# Patient Record
Sex: Male | Born: 1940
Health system: Southern US, Community
[De-identification: ages and names within clinical notes are randomized; demographics above are authoritative.]

## PROBLEM LIST (undated history)

## (undated) DIAGNOSIS — C4491 Basal cell carcinoma of skin, unspecified: Secondary | ICD-10-CM

## (undated) DIAGNOSIS — N400 Enlarged prostate without lower urinary tract symptoms: Secondary | ICD-10-CM

## (undated) DIAGNOSIS — Z87442 Personal history of urinary calculi: Secondary | ICD-10-CM

## (undated) DIAGNOSIS — F419 Anxiety disorder, unspecified: Secondary | ICD-10-CM

## (undated) DIAGNOSIS — I499 Cardiac arrhythmia, unspecified: Secondary | ICD-10-CM

## (undated) DIAGNOSIS — Z973 Presence of spectacles and contact lenses: Secondary | ICD-10-CM

## (undated) DIAGNOSIS — K2270 Barrett's esophagus without dysplasia: Secondary | ICD-10-CM

## (undated) DIAGNOSIS — J90 Pleural effusion, not elsewhere classified: Secondary | ICD-10-CM

## (undated) DIAGNOSIS — R011 Cardiac murmur, unspecified: Secondary | ICD-10-CM

## (undated) DIAGNOSIS — I10 Essential (primary) hypertension: Secondary | ICD-10-CM

## (undated) DIAGNOSIS — K219 Gastro-esophageal reflux disease without esophagitis: Secondary | ICD-10-CM

## (undated) DIAGNOSIS — Z95828 Presence of other vascular implants and grafts: Secondary | ICD-10-CM

## (undated) DIAGNOSIS — IMO0002 Reserved for concepts with insufficient information to code with codable children: Secondary | ICD-10-CM

## (undated) DIAGNOSIS — R0600 Dyspnea, unspecified: Secondary | ICD-10-CM

## (undated) DIAGNOSIS — C159 Malignant neoplasm of esophagus, unspecified: Secondary | ICD-10-CM

## (undated) DIAGNOSIS — K769 Liver disease, unspecified: Secondary | ICD-10-CM

## (undated) DIAGNOSIS — R350 Frequency of micturition: Secondary | ICD-10-CM

## (undated) DIAGNOSIS — R609 Edema, unspecified: Secondary | ICD-10-CM

## (undated) DIAGNOSIS — T18128A Food in esophagus causing other injury, initial encounter: Secondary | ICD-10-CM

## (undated) DIAGNOSIS — Z9221 Personal history of antineoplastic chemotherapy: Secondary | ICD-10-CM

## (undated) DIAGNOSIS — IMO0001 Reserved for inherently not codable concepts without codable children: Secondary | ICD-10-CM

## (undated) DIAGNOSIS — G709 Myoneural disorder, unspecified: Secondary | ICD-10-CM

## (undated) DIAGNOSIS — M1990 Unspecified osteoarthritis, unspecified site: Secondary | ICD-10-CM

## (undated) DIAGNOSIS — T7840XA Allergy, unspecified, initial encounter: Secondary | ICD-10-CM

## (undated) HISTORY — DX: Benign prostatic hyperplasia without lower urinary tract symptoms: N40.0

## (undated) HISTORY — PX: KNEE ARTHROSCOPY: SUR90

## (undated) HISTORY — DX: Basal cell carcinoma of skin, unspecified: C44.91

## (undated) HISTORY — DX: Malignant neoplasm of esophagus, unspecified: C15.9

## (undated) HISTORY — PX: COLONOSCOPY: SHX174

## (undated) HISTORY — DX: Anxiety disorder, unspecified: F41.9

## (undated) HISTORY — DX: Unspecified osteoarthritis, unspecified site: M19.90

## (undated) HISTORY — DX: Essential (primary) hypertension: I10

---
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
## 1978-04-24 HISTORY — PX: CERVICAL LAMINECTOMY: SHX94

## 2005-02-27 ENCOUNTER — Ambulatory Visit (HOSPITAL_BASED_OUTPATIENT_CLINIC_OR_DEPARTMENT_OTHER): Admission: RE | Admit: 2005-02-27 | Discharge: 2005-02-27 | Payer: Self-pay | Admitting: Orthopedic Surgery

## 2005-02-27 ENCOUNTER — Ambulatory Visit (HOSPITAL_COMMUNITY): Admission: RE | Admit: 2005-02-27 | Discharge: 2005-02-27 | Payer: Self-pay | Admitting: Orthopedic Surgery

## 2007-12-24 ENCOUNTER — Ambulatory Visit: Payer: Self-pay | Admitting: Internal Medicine

## 2008-01-27 ENCOUNTER — Ambulatory Visit: Payer: Self-pay | Admitting: Internal Medicine

## 2008-02-05 ENCOUNTER — Ambulatory Visit: Payer: Self-pay | Admitting: Internal Medicine

## 2008-02-05 ENCOUNTER — Encounter: Payer: Self-pay | Admitting: Internal Medicine

## 2008-02-06 ENCOUNTER — Encounter: Payer: Self-pay | Admitting: Internal Medicine

## 2008-04-24 HISTORY — PX: TOTAL KNEE ARTHROPLASTY: SHX125

## 2008-10-13 ENCOUNTER — Ambulatory Visit (HOSPITAL_BASED_OUTPATIENT_CLINIC_OR_DEPARTMENT_OTHER): Admission: RE | Admit: 2008-10-13 | Discharge: 2008-10-13 | Payer: Self-pay | Admitting: Orthopedic Surgery

## 2009-06-14 ENCOUNTER — Inpatient Hospital Stay (HOSPITAL_COMMUNITY): Admission: RE | Admit: 2009-06-14 | Discharge: 2009-06-16 | Payer: Self-pay | Admitting: Orthopedic Surgery

## 2010-05-16 ENCOUNTER — Ambulatory Visit
Admission: RE | Admit: 2010-05-16 | Discharge: 2010-05-16 | Payer: Self-pay | Source: Home / Self Care | Attending: Orthopedic Surgery | Admitting: Orthopedic Surgery

## 2010-05-16 LAB — BASIC METABOLIC PANEL
BUN: 14 mg/dL (ref 6–23)
Calcium: 9.3 mg/dL (ref 8.4–10.5)
Chloride: 103 mEq/L (ref 96–112)
Creatinine, Ser: 0.84 mg/dL (ref 0.4–1.5)

## 2010-05-17 LAB — POCT HEMOGLOBIN-HEMACUE: Hemoglobin: 16.1 g/dL (ref 13.0–17.0)

## 2010-05-25 NOTE — Op Note (Signed)
David [name], David [name]                  ACCOUNT NO.:  [id number]  MEDICAL RECORD NO.:  [id number]          PATIENT TYPE:  AMB  LOCATION:  DSC                          FACILITY:  [facility]  PHYSICIAN:  Veverly Larimer A. Thurston Hole, M.D. DATE OF BIRTH:  November 11, 1940  DATE OF PROCEDURE:  05/16/2010 DATE OF DISCHARGE:                              OPERATIVE REPORT   PREOPERATIVE DIAGNOSIS:  Left knee medial and lateral meniscal tears with chondromalacia and synovitis.  POSTOPERATIVE DIAGNOSIS:  Left knee medial and lateral meniscal tears with chondromalacia and synovitis.  PROCEDURES: 1. Left knee examination under anesthesia followed by arthroscopic     partial medial and lateral meniscectomies. 2. Left knee chondroplasty with partial synovectomy.  SURGEON:  Elana Alm. Thurston Hole, MD  ASSISTANT:  None.  ANESTHESIA:  General.  OPERATIVE TIME:  30 minutes.  COMPLICATIONS:  None.  INDICATIONS FOR PROCEDURE:  [name] is a 70 year old gentleman who has had 6-8 months of increasing left knee pain with exam and MRI documenting meniscal tearing with chondromalacia and synovitis.  He has failed conservative care and is now to undergo arthroscopy.  DESCRIPTION:  [name] was brought to the operating room on May 16, 2010, after a knee block was placed in holding area by Anesthesia.  He was placed on the operative table in supine position.  He received Ancef 1 g IV preoperatively for prophylaxis.  After being placed under general anesthesia, his left knee was examined.  He had full range of motion. He had stable ligamentous exam with normal patellar tracking.  Left leg was prepped using sterile DuraPrep and draped using sterile technique. Time-out procedure was called and the correct left leg identified. Initially through an anterolateral portal, the arthroscope with a pump attached was placed into an anteromedial portal and arthroscopic probe was placed.  On initial inspection of medial compartment, he had  25-30% grade 3 chondromalacia which was debrided, medial meniscus showed tearing of the anterior and posterior horn of which 75% of the anterior horn, 30% of the posterior horn was resected back to stable rim. Intercondylar notch inspected.  Anterior and posterior cruciate ligaments were normal.  Lateral compartment was inspected.  The articular cartilage showed 25% grade 3 chondromalacia which was debrided.  Lateral meniscus showed a tear of the posteromedial root 25% which was resected back to stable rim.  Patellofemoral joint showed 25- 30% grade 3 chondromalacia on the patellofemoral groove and this was debrided.  The patella tracked normally.  There was significant very nodular synovitis in medial and lateral gutters which was thoroughly debrided, otherwise, this was free of pathology.  After this was done, it was felt that all pathology had been satisfactorily addressed.  The instruments removed.  Portals closed with 3-0 nylon suture.  Sterile dressings were applied, and the patient awakened and taken to recovery room in stable condition.  FOLLOWUP CARE:  [name] will be followed as an outpatient on Percocet for pain.  He will be seen back in the office in a week for sutures out and followup.     Gaelen Brager A. Thurston Hole, M.D.     RAW/MEDQ  D:  05/16/2010  T:  05/17/2010  Job:  [id number]  Electronically Signed by Salvatore Marvel M.D. on 05/25/2010 10:43:41 AM

## 2010-07-13 LAB — CBC
HCT: 38 % — ABNORMAL LOW (ref 39.0–52.0)
HCT: 46.5 % (ref 39.0–52.0)
Hemoglobin: 13.2 g/dL (ref 13.0–17.0)
Hemoglobin: 16.2 g/dL (ref 13.0–17.0)
MCHC: 34.7 g/dL (ref 30.0–36.0)
MCV: 96.3 fL (ref 78.0–100.0)
Platelets: 138 10*3/uL — ABNORMAL LOW (ref 150–400)
RBC: 3.95 MIL/uL — ABNORMAL LOW (ref 4.22–5.81)
RBC: 4.86 MIL/uL (ref 4.22–5.81)
RDW: 12.3 % (ref 11.5–15.5)
RDW: 12.6 % (ref 11.5–15.5)
WBC: 11 10*3/uL — ABNORMAL HIGH (ref 4.0–10.5)
WBC: 13.3 10*3/uL — ABNORMAL HIGH (ref 4.0–10.5)
WBC: 7.1 10*3/uL (ref 4.0–10.5)

## 2010-07-13 LAB — URINE CULTURE
Colony Count: 9000
Colony Count: NO GROWTH

## 2010-07-13 LAB — BASIC METABOLIC PANEL
BUN: 8 mg/dL (ref 6–23)
CO2: 29 mEq/L (ref 19–32)
Calcium: 8.3 mg/dL — ABNORMAL LOW (ref 8.4–10.5)
GFR calc Af Amer: >60 mL/min (ref >60)
Glucose, Bld: 108 mg/dL — ABNORMAL HIGH (ref 70–99)
Glucose, Bld: 124 mg/dL — ABNORMAL HIGH (ref 70–99)
Sodium: 132 mEq/L — ABNORMAL LOW (ref 135–145)

## 2010-07-13 LAB — URINALYSIS, ROUTINE W REFLEX MICROSCOPIC
Bilirubin Urine: NEGATIVE
Glucose, UA: NEGATIVE mg/dL
Glucose, UA: NEGATIVE mg/dL
Hgb urine dipstick: NEGATIVE
Nitrite: NEGATIVE
Specific Gravity, Urine: 1.017 (ref 1.005–1.030)
Urobilinogen, UA: 0.2 mg/dL (ref 0.0–1.0)
Urobilinogen, UA: 1 mg/dL (ref 0.0–1.0)
pH: 6.5 (ref 5.0–8.0)
pH: 7.5 (ref 5.0–8.0)

## 2010-07-13 LAB — COMPREHENSIVE METABOLIC PANEL
AST: 27 U/L (ref 0–37)
BUN: 14 mg/dL (ref 6–23)
Calcium: 9.6 mg/dL (ref 8.4–10.5)
Creatinine, Ser: 0.92 mg/dL (ref 0.4–1.5)
GFR calc non Af Amer: >60 mL/min (ref >60)
Sodium: 143 mEq/L (ref 135–145)

## 2010-07-13 LAB — ABO/RH: ABO/RH(D): O POS

## 2010-07-13 LAB — DIFFERENTIAL
Eosinophils Absolute: 0.1 10*3/uL (ref 0.0–0.7)
Lymphocytes Relative: 23 % (ref 12–46)
Lymphs Abs: 1.7 10*3/uL (ref 0.7–4.0)

## 2010-07-13 LAB — PROTIME-INR
INR: 1.36 (ref 0.00–1.49)
Prothrombin Time: 12.4 seconds (ref 11.6–15.2)
Prothrombin Time: 15.1 seconds (ref 11.6–15.2)

## 2010-07-13 LAB — APTT: aPTT: 29 seconds (ref 24–37)

## 2010-08-01 LAB — BASIC METABOLIC PANEL WITH GFR
BUN: 13 mg/dL (ref 6–23)
CO2: 29 meq/L (ref 19–32)
Calcium: 9.1 mg/dL (ref 8.4–10.5)
Chloride: 104 meq/L (ref 96–112)
Creatinine, Ser: 0.73 mg/dL (ref 0.4–1.5)
GFR calc non Af Amer: >60 mL/min
Glucose, Bld: 98 mg/dL (ref 70–99)
Potassium: 4 meq/L (ref 3.5–5.1)
Sodium: 139 meq/L (ref 135–145)

## 2010-09-06 NOTE — Op Note (Signed)
David [name], David [name]                  ACCOUNT NO.:  [id number]   MEDICAL RECORD NO.:  [id number]          PATIENT TYPE:  AMB   LOCATION:  DSC                          FACILITY:  [facility]   PHYSICIAN:  Robert A. Thurston Hole, M.D. DATE OF BIRTH:  [birth year]   DATE OF PROCEDURE:  10/13/2008  DATE OF DISCHARGE:                               OPERATIVE REPORT   PREOPERATIVE DIAGNOSIS:  Right knee medial and lateral meniscal tears  with chondromalacia and synovitis.   POSTOPERATIVE DIAGNOSIS:  Right knee medial and lateral meniscal tears  with chondromalacia and synovitis.   PROCEDURE:  1. Right knee examination under anesthesia followed by orthoscopic      partial medial and lateral meniscectomies.  2. Right knee chondroplasty with partial synovectomy.   SURGEON:  Elana Alm. Thurston Hole, MD   ANESTHESIA:  Local and MAC.   OPERATIVE TIME:  30 minutes.   COMPLICATIONS:  None.   INDICATIONS FOR PROCEDURE:  Mr. Goeller is a 70 year old gentleman who has  had significant increasing right knee pain for the past 6-8 months with  exam and MRI documenting meniscal tearing with chondromalacia and  synovitis.  He has failed conservative care and is now to undergo  arthroscopy.   DESCRIPTION:  Mr. Bahl was brought to the operating room on October 13, 2008, after a knee block had been placed in the holding by Anesthesia.  He was placed on the operating table in supine position.  His right knee  was examined under anesthesia.  He had full range of motion.  He was  stable on ligamentous exam with normal patellar tracking.  The right leg  was then prepped using sterile DuraPrep and draped using sterile  technique.  He received Ancef 2 g IV preoperatively for prophylaxis.  After the right leg was prepped and draped sterilely then the  arthroscopy was performed.  Through an anterolateral portal, the  arthroscope with a pump attached was placed and through an anteromedial  portal, an arthroscopic probe was placed.  On  initial inspection of the  medial compartment, the articular cartilage showed 50% grade III  chondromalacia which was debrided.  Medial meniscus showed tearing in  the posterior and medial horn in which 50% was resected back to a stable  rim.  Intercondylar notch inspected, the anterior and posterior cruciate  ligaments were normal.  Lateral compartment inspected, the articular  cartilage showed 30% grade III chondromalacia.  Lateral meniscus partial  tear 30% posterolateral corner which was resected back to a stable rim.  Patellofemoral joint showed 30% grade III chondromalacia which was  debrided.  The patella tracked normally.  Moderate synovitis, and medial  and lateral gutters were debrided, otherwise, it is free of pathology.  After this was done, it was felt that all pathology had been  satisfactorily addressed.  The instruments were removed.  The portals  were closed with 3-0 nylon suture.  Sterile dressings were applied and  then the patient awakened and taken to the recovery room in stable  condition.   FOLLOWUP CARE:  Mr. Pate will be followed as an  outpatient on Vicodin and  ibuprofen.  He will be seen back in the office in a week for sutures out  and followup.      Robert A. Thurston Hole, M.D.  Electronically Signed     RAW/MEDQ  D:  10/13/2008  T:  10/14/2008  Job:  [id number]

## 2010-09-09 NOTE — Op Note (Signed)
David [name], David [name]                  ACCOUNT NO.:  [id number]   MEDICAL RECORD NO.:  [id number]          PATIENT TYPE:  AMB   LOCATION:  DSC                          FACILITY:  [facility]   PHYSICIAN:  Robert A. Thurston Hole, M.D. DATE OF BIRTH:  01/31/1941   DATE OF PROCEDURE:  02/27/2005  DATE OF DISCHARGE:                                 OPERATIVE REPORT   PREOPERATIVE DIAGNOSIS:  Right knee medial and lateral meniscal tears with  chondromalacia and synovitis.   POSTOPERATIVE DIAGNOSIS:  Right knee medial and lateral meniscal tears with  chondromalacia and synovitis.   PROCEDURE:  Right knee EUA followed by arthroscopic partial medial and  lateral meniscectomy.  Right knee chondroplasty with partial synovectomy.   SURGEON:  Elana Alm. Thurston Hole, M.D.   ASSISTANT:  Julien Girt, P.A.   ANESTHESIA:  General.   OPERATIVE TIME:  30 minutes.   COMPLICATIONS:  None.   INDICATIONS FOR PROCEDURE:  David [name] is a 70 year old gentleman who has had  six months of increasing right knee pain with exam and MRI documenting  medial and lateral meniscal tears with chondromalacia and synovitis, who has  failed conservative care and is now to undergo arthroscopy.   DESCRIPTION:  David [name] was brought to operating room on February 27, 2005,  placed on the operative table supine position. After adequate level general  anesthesia was obtained, his right knee was examined. He had range of motion  from 0 to 125 degrees, 2 to 3+ crepitation, knee stable ligamentous exam  with normal patellar tracking. The right leg was prepped in sterile DuraPrep  and draped using sterile technique. Originally through an anterolateral  portal, the arthroscope with a pump attached was placed and through an  anteromedial portal an arthroscopic probe was placed. On initial inspection  medial compartment was found to have 50% grade 3 chondromalacia which was  debrided. Medial meniscus tear posterior medial horn of which 50% was  resected back to stable rim. Intercondylar notch inspected, anterior and  posterior cruciate ligaments were normal. Lateral compartments inspected,  30% grade 3 chondromalacia which was debrided. Lateral meniscus tear 25%  posterior and lateral horn which was resected back to stable rim.  Patellofemoral joint showed 50% grade 3 chondromalacia which was debrided.  The patella tracked normally. Significant synovitis in the medial lateral  gutters were debrided. Otherwise they are free of pathology. After this was  done it was felt that all pathology had been satisfactorily addressed. The  instruments were removed. Portals closed with 3-0 nylon suture and injected  with 0.25% Marcaine with epinephrine 4 milligrams morphine. Sterile  dressings applied. The patient awakened and taken to recovery in stable  condition.   FOLLOW-UP CARE:  David [name] will be followed as outpatient on Vicodin and  Naprosyn. See him back in the office in a week for sutures out follow-up.      Robert A. Thurston Hole, M.D.  Electronically Signed     RAW/MEDQ  D:  02/27/2005  T:  02/27/2005  Job:  [id number]

## 2011-01-24 ENCOUNTER — Other Ambulatory Visit: Payer: Self-pay | Admitting: Dermatology

## 2011-06-08 ENCOUNTER — Ambulatory Visit (AMBULATORY_SURGERY_CENTER): Payer: Medicare Other | Admitting: *Deleted

## 2011-06-08 VITALS — Ht 76.0 in | Wt 234.4 lb

## 2011-06-08 DIAGNOSIS — Z1211 Encounter for screening for malignant neoplasm of colon: Secondary | ICD-10-CM

## 2011-06-08 MED ORDER — PEG-KCL-NACL-NASULF-NA ASC-C 100 G PO SOLR: ORAL | Status: DC

## 2011-06-14 ENCOUNTER — Ambulatory Visit (AMBULATORY_SURGERY_CENTER): Payer: Medicare Other | Admitting: Internal Medicine

## 2011-06-14 ENCOUNTER — Encounter: Payer: Self-pay | Admitting: Internal Medicine

## 2011-06-14 VITALS — BP 148/82 | HR 81 | Temp 97.3°F | Resp 20 | Ht 76.0 in | Wt 234.0 lb

## 2011-06-14 DIAGNOSIS — D126 Benign neoplasm of colon, unspecified: Secondary | ICD-10-CM | POA: Diagnosis not present

## 2011-06-14 DIAGNOSIS — Z1211 Encounter for screening for malignant neoplasm of colon: Secondary | ICD-10-CM

## 2011-06-14 DIAGNOSIS — Z86010 Personal history of colonic polyps: Secondary | ICD-10-CM | POA: Diagnosis not present

## 2011-06-14 DIAGNOSIS — F411 Generalized anxiety disorder: Secondary | ICD-10-CM | POA: Diagnosis not present

## 2011-06-14 DIAGNOSIS — I10 Essential (primary) hypertension: Secondary | ICD-10-CM | POA: Diagnosis not present

## 2011-06-14 MED ORDER — SODIUM CHLORIDE 0.9 % IV SOLN: 500.0000 mL | INTRAVENOUS | Status: DC

## 2011-06-14 NOTE — Patient Instructions (Signed)
YOU HAD AN ENDOSCOPIC PROCEDURE TODAY AT THE [facility]: Refer to the procedure report that was given to you for any specific questions about what was found during the examination.  If the procedure report does not answer your questions, please call your gastroenterologist to clarify.  If you requested that your care partner not be given the details of your procedure findings, then the procedure report has been included in a sealed envelope for you to review at your convenience later.  YOU SHOULD EXPECT: Some feelings of bloating in the abdomen. Passage of more gas than usual.  Walking can help get rid of the air that was put into your GI tract during the procedure and reduce the bloating. If you had a lower endoscopy (such as a colonoscopy or flexible sigmoidoscopy) you may notice spotting of blood in your stool or on the toilet paper. If you underwent a bowel prep for your procedure, then you may not have a normal bowel movement for a few days.  DIET: Your first meal following the procedure should be a light meal and then it is ok to progress to your normal diet.  A half-sandwich or bowl of soup is an example of a good first meal.  Heavy or fried foods are harder to digest and may make you feel nauseous or bloated.  Likewise meals heavy in dairy and vegetables can cause extra gas to form and this can also increase the bloating.  Drink plenty of fluids but you should avoid alcoholic beverages for 24 hours.  ACTIVITY: Your care partner should take you home directly after the procedure.  You should plan to take it easy, moving slowly for the rest of the day.  You can resume normal activity the day after the procedure however you should NOT DRIVE or use heavy machinery for 24 hours (because of the sedation medicines used during the test).    SYMPTOMS TO REPORT IMMEDIATELY: A gastroenterologist can be reached at any hour.  During normal business hours, 8:30 AM to 5:00 PM Monday through Friday,  call [phone].  After hours and on weekends, please call the GI answering service at [phone] who will take a message and have the physician on call contact you.   Following lower endoscopy (colonoscopy or flexible sigmoidoscopy):  Excessive amounts of blood in the stool  Significant tenderness or worsening of abdominal pains  Swelling of the abdomen that is new, acute  Fever of 100F or higher    FOLLOW UP: If any biopsies were taken you will be contacted by phone or by letter within the next 1-3 weeks.  Call your gastroenterologist if you have not heard about the biopsies in 3 weeks.  Our staff will call the home number listed on your records the next business day following your procedure to check on you and address any questions or concerns that you may have at that time regarding the information given to you following your procedure. This is a courtesy call and so if there is no answer at the home number and we have not heard from you through the emergency physician on call, we will assume that you have returned to your regular daily activities without incident.  SIGNATURES/CONFIDENTIALITY: You and/or your care partner have signed paperwork which will be entered into your electronic medical record.  These signatures attest to the fact that that the information above on your After Visit Summary has been reviewed and is understood.  Full responsibility of the confidentiality   of this discharge information lies with you and/or your care-partner.     

## 2011-06-14 NOTE — Op Note (Signed)
[facility] [address] [place], [place]  [address]  COLONOSCOPY PROCEDURE REPORT  PATIENT:  David [name], David [name]  MR#:  [id number] BIRTHDATE:  10-25-1940, 70 yrs. old  GENDER:  male ENDOSCOPIST:  Wilhemina Bonito. Eda Keys, MD REF. BY:  Surveillance Program Recall, PROCEDURE DATE:  06/14/2011 PROCEDURE:  Colonoscopy with snare polypectomy x 1 ASA CLASS:  Class II INDICATIONS:  history of pre-cancerous (adenomatous) colon polyps, surveillance and high-risk screening ; index exam w/ 15mm proximal sessile serrated polyp MEDICATIONS:   MAC sedation, administered by CRNA, propofol (Diprivan) 350 mg IV  DESCRIPTION OF PROCEDURE:   After the risks benefits and alternatives of the procedure were thoroughly explained, informed consent was obtained.  Digital rectal exam was performed and revealed no abnormalities.   The LB 180AL [id number] endoscope was introduced through the anus and advanced to the cecum, which was identified by both the appendix and ileocecal valve, without limitations.  The quality of the prep was good, using MoviPrep. The instrument was then slowly withdrawn as the colon was fully examined. <<PROCEDUREIMAGES>>  FINDINGS:  A diminutive polyp was found in the ascending colon and snared without cautery. Retrieval was successful. Moderate diverticulosis was found in the left colon.  Otherwise normal colonoscopy without other polyps, masses, vascular ectasias, or inflammatory changes.   Retroflexed views in the rectum revealed internal hemorrhoids.    The time to cecum =  6:39  minutes. The scope was then withdrawn in  13:45  minutes from the cecum and the procedure completed.  COMPLICATIONS:  None  ENDOSCOPIC IMPRESSION: 1) Diminutive polyp in the ascending colon - removed 2) Moderate diverticulosis in the left colon 3) Otherwise normal colonoscopy 4) Internal hemorrhoids  RECOMMENDATIONS: 1) Follow up colonoscopy in 5 years  ______________________________ Wilhemina Bonito.  Eda Keys, MD  CC:  Kari Baars, MD;   The Patient  n. eSIGNED:   Wilhemina Bonito. Eda Keys at 06/14/2011 12:05 PM  Simonne Martinet, [phone]

## 2011-06-14 NOTE — Progress Notes (Signed)
Propofol per s camp crna per protocol. See scanned intra procedure report. ewm 

## 2011-06-14 NOTE — Progress Notes (Signed)
1233 pt. C/o  Abdominal discomfort level 2 pt. Passed large amount of air. Pt passed more air during dressing and stated that the pain was gone.  1243 upon D/C pt. Denied pain.Patient did not experience any of the following events: a burn prior to discharge; a fall within the facility; wrong site/side/patient/procedure/implant event; or a hospital transfer or hospital admission upon discharge from the facility. [phone]) Patient did not have preoperative order for IV antibiotic SSI prophylaxis. [phone])

## 2011-06-15 ENCOUNTER — Telehealth: Payer: Self-pay

## 2011-06-15 NOTE — Telephone Encounter (Signed)
  Follow up Call-  Call back number 06/14/2011  Post procedure Call Back phone  # [phone] or cell [phone]  Permission to leave phone message Yes     Patient questions:  Do you have a fever, pain , or abdominal swelling? no Pain Score  0 *  Have you tolerated food without any problems? yes  Have you been able to return to your normal activities? yes  Do you have any questions about your discharge instructions: Diet   no Medications  no Follow up visit  no  Do you have questions or concerns about your Care? no  Actions: * If pain score is 4 or above: No action needed, pain <4. No problems per the pt. Maw

## 2011-06-19 ENCOUNTER — Encounter: Payer: Self-pay | Admitting: Internal Medicine

## 2011-06-20 DIAGNOSIS — Z0000 Encounter for general adult medical examination without abnormal findings: Secondary | ICD-10-CM | POA: Diagnosis not present

## 2011-06-20 DIAGNOSIS — Z529 Donor of unspecified organ or tissue: Secondary | ICD-10-CM | POA: Diagnosis not present

## 2011-06-21 DIAGNOSIS — Z0000 Encounter for general adult medical examination without abnormal findings: Secondary | ICD-10-CM | POA: Diagnosis not present

## 2011-07-05 DIAGNOSIS — E669 Obesity, unspecified: Secondary | ICD-10-CM | POA: Diagnosis not present

## 2011-07-05 DIAGNOSIS — I10 Essential (primary) hypertension: Secondary | ICD-10-CM | POA: Diagnosis not present

## 2011-07-05 DIAGNOSIS — E785 Hyperlipidemia, unspecified: Secondary | ICD-10-CM | POA: Diagnosis not present

## 2011-07-05 DIAGNOSIS — Z125 Encounter for screening for malignant neoplasm of prostate: Secondary | ICD-10-CM | POA: Diagnosis not present

## 2011-07-11 DIAGNOSIS — Z125 Encounter for screening for malignant neoplasm of prostate: Secondary | ICD-10-CM | POA: Diagnosis not present

## 2011-07-12 DIAGNOSIS — Z0000 Encounter for general adult medical examination without abnormal findings: Secondary | ICD-10-CM | POA: Diagnosis not present

## 2011-07-12 DIAGNOSIS — Z23 Encounter for immunization: Secondary | ICD-10-CM | POA: Diagnosis not present

## 2011-07-12 DIAGNOSIS — R0989 Other specified symptoms and signs involving the circulatory and respiratory systems: Secondary | ICD-10-CM | POA: Diagnosis not present

## 2011-07-12 DIAGNOSIS — E785 Hyperlipidemia, unspecified: Secondary | ICD-10-CM | POA: Diagnosis not present

## 2011-07-12 DIAGNOSIS — R0609 Other forms of dyspnea: Secondary | ICD-10-CM | POA: Diagnosis not present

## 2011-07-12 DIAGNOSIS — I10 Essential (primary) hypertension: Secondary | ICD-10-CM | POA: Diagnosis not present

## 2011-09-07 DIAGNOSIS — M75120 Complete rotator cuff tear or rupture of unspecified shoulder, not specified as traumatic: Secondary | ICD-10-CM | POA: Diagnosis not present

## 2011-09-12 ENCOUNTER — Other Ambulatory Visit: Payer: Self-pay | Admitting: Dermatology

## 2011-09-12 DIAGNOSIS — L821 Other seborrheic keratosis: Secondary | ICD-10-CM | POA: Diagnosis not present

## 2011-09-12 DIAGNOSIS — C44319 Basal cell carcinoma of skin of other parts of face: Secondary | ICD-10-CM | POA: Diagnosis not present

## 2011-09-12 DIAGNOSIS — Z85828 Personal history of other malignant neoplasm of skin: Secondary | ICD-10-CM | POA: Diagnosis not present

## 2011-09-12 DIAGNOSIS — L578 Other skin changes due to chronic exposure to nonionizing radiation: Secondary | ICD-10-CM | POA: Diagnosis not present

## 2011-09-20 DIAGNOSIS — M24119 Other articular cartilage disorders, unspecified shoulder: Secondary | ICD-10-CM | POA: Diagnosis not present

## 2011-09-20 DIAGNOSIS — M19019 Primary osteoarthritis, unspecified shoulder: Secondary | ICD-10-CM | POA: Diagnosis not present

## 2011-09-20 DIAGNOSIS — M75100 Unspecified rotator cuff tear or rupture of unspecified shoulder, not specified as traumatic: Secondary | ICD-10-CM | POA: Diagnosis not present

## 2011-09-22 DIAGNOSIS — M25669 Stiffness of unspecified knee, not elsewhere classified: Secondary | ICD-10-CM | POA: Diagnosis not present

## 2011-09-27 DIAGNOSIS — M25669 Stiffness of unspecified knee, not elsewhere classified: Secondary | ICD-10-CM | POA: Diagnosis not present

## 2011-09-27 DIAGNOSIS — M19019 Primary osteoarthritis, unspecified shoulder: Secondary | ICD-10-CM | POA: Diagnosis not present

## 2011-09-29 DIAGNOSIS — M19019 Primary osteoarthritis, unspecified shoulder: Secondary | ICD-10-CM | POA: Diagnosis not present

## 2011-09-29 DIAGNOSIS — M25669 Stiffness of unspecified knee, not elsewhere classified: Secondary | ICD-10-CM | POA: Diagnosis not present

## 2011-10-02 DIAGNOSIS — M25669 Stiffness of unspecified knee, not elsewhere classified: Secondary | ICD-10-CM | POA: Diagnosis not present

## 2011-10-04 DIAGNOSIS — M25669 Stiffness of unspecified knee, not elsewhere classified: Secondary | ICD-10-CM | POA: Diagnosis not present

## 2011-10-06 DIAGNOSIS — M25669 Stiffness of unspecified knee, not elsewhere classified: Secondary | ICD-10-CM | POA: Diagnosis not present

## 2011-10-09 DIAGNOSIS — M19019 Primary osteoarthritis, unspecified shoulder: Secondary | ICD-10-CM | POA: Diagnosis not present

## 2011-10-09 DIAGNOSIS — M25669 Stiffness of unspecified knee, not elsewhere classified: Secondary | ICD-10-CM | POA: Diagnosis not present

## 2011-10-11 DIAGNOSIS — M19019 Primary osteoarthritis, unspecified shoulder: Secondary | ICD-10-CM | POA: Diagnosis not present

## 2011-10-11 DIAGNOSIS — M25669 Stiffness of unspecified knee, not elsewhere classified: Secondary | ICD-10-CM | POA: Diagnosis not present

## 2011-10-13 DIAGNOSIS — M25669 Stiffness of unspecified knee, not elsewhere classified: Secondary | ICD-10-CM | POA: Diagnosis not present

## 2011-10-17 DIAGNOSIS — M25669 Stiffness of unspecified knee, not elsewhere classified: Secondary | ICD-10-CM | POA: Diagnosis not present

## 2011-10-17 DIAGNOSIS — C44319 Basal cell carcinoma of skin of other parts of face: Secondary | ICD-10-CM | POA: Diagnosis not present

## 2011-10-19 DIAGNOSIS — M25669 Stiffness of unspecified knee, not elsewhere classified: Secondary | ICD-10-CM | POA: Diagnosis not present

## 2011-10-23 DIAGNOSIS — M25669 Stiffness of unspecified knee, not elsewhere classified: Secondary | ICD-10-CM | POA: Diagnosis not present

## 2011-10-25 DIAGNOSIS — M25669 Stiffness of unspecified knee, not elsewhere classified: Secondary | ICD-10-CM | POA: Diagnosis not present

## 2011-10-30 DIAGNOSIS — M25669 Stiffness of unspecified knee, not elsewhere classified: Secondary | ICD-10-CM | POA: Diagnosis not present

## 2011-11-01 DIAGNOSIS — M25669 Stiffness of unspecified knee, not elsewhere classified: Secondary | ICD-10-CM | POA: Diagnosis not present

## 2011-11-01 DIAGNOSIS — Z4789 Encounter for other orthopedic aftercare: Secondary | ICD-10-CM | POA: Diagnosis not present

## 2011-11-02 DIAGNOSIS — M25669 Stiffness of unspecified knee, not elsewhere classified: Secondary | ICD-10-CM | POA: Diagnosis not present

## 2011-11-07 DIAGNOSIS — M25669 Stiffness of unspecified knee, not elsewhere classified: Secondary | ICD-10-CM | POA: Diagnosis not present

## 2012-03-15 DIAGNOSIS — D1801 Hemangioma of skin and subcutaneous tissue: Secondary | ICD-10-CM | POA: Diagnosis not present

## 2012-03-15 DIAGNOSIS — L819 Disorder of pigmentation, unspecified: Secondary | ICD-10-CM | POA: Diagnosis not present

## 2012-03-15 DIAGNOSIS — D239 Other benign neoplasm of skin, unspecified: Secondary | ICD-10-CM | POA: Diagnosis not present

## 2012-03-15 DIAGNOSIS — L821 Other seborrheic keratosis: Secondary | ICD-10-CM | POA: Diagnosis not present

## 2012-03-15 DIAGNOSIS — L578 Other skin changes due to chronic exposure to nonionizing radiation: Secondary | ICD-10-CM | POA: Diagnosis not present

## 2012-03-15 DIAGNOSIS — L570 Actinic keratosis: Secondary | ICD-10-CM | POA: Diagnosis not present

## 2012-03-15 DIAGNOSIS — Z85828 Personal history of other malignant neoplasm of skin: Secondary | ICD-10-CM | POA: Diagnosis not present

## 2012-05-20 DIAGNOSIS — H52229 Regular astigmatism, unspecified eye: Secondary | ICD-10-CM | POA: Diagnosis not present

## 2012-05-20 DIAGNOSIS — H01009 Unspecified blepharitis unspecified eye, unspecified eyelid: Secondary | ICD-10-CM | POA: Diagnosis not present

## 2012-05-20 DIAGNOSIS — H524 Presbyopia: Secondary | ICD-10-CM | POA: Diagnosis not present

## 2012-05-20 DIAGNOSIS — H2510 Age-related nuclear cataract, unspecified eye: Secondary | ICD-10-CM | POA: Diagnosis not present

## 2012-05-30 ENCOUNTER — Other Ambulatory Visit: Payer: Self-pay | Admitting: Internal Medicine

## 2012-05-30 DIAGNOSIS — R1011 Right upper quadrant pain: Secondary | ICD-10-CM | POA: Diagnosis not present

## 2012-05-31 ENCOUNTER — Ambulatory Visit
Admission: RE | Admit: 2012-05-31 | Discharge: 2012-05-31 | Disposition: A | Discharge status: Home / Self Care | Payer: Medicare Other | Source: Ambulatory Visit | Attending: Internal Medicine | Admitting: Internal Medicine

## 2012-05-31 DIAGNOSIS — R1011 Right upper quadrant pain: Secondary | ICD-10-CM

## 2012-05-31 DIAGNOSIS — K7689 Other specified diseases of liver: Secondary | ICD-10-CM | POA: Diagnosis not present

## 2012-09-09 ENCOUNTER — Encounter (INDEPENDENT_AMBULATORY_CARE_PROVIDER_SITE_OTHER): Payer: Medicare Other | Admitting: *Deleted

## 2012-09-09 DIAGNOSIS — M79609 Pain in unspecified limb: Secondary | ICD-10-CM

## 2012-09-09 DIAGNOSIS — I872 Venous insufficiency (chronic) (peripheral): Secondary | ICD-10-CM | POA: Diagnosis not present

## 2012-09-10 ENCOUNTER — Encounter: Payer: Self-pay | Admitting: Internal Medicine

## 2012-10-01 DIAGNOSIS — I10 Essential (primary) hypertension: Secondary | ICD-10-CM | POA: Diagnosis not present

## 2012-10-01 DIAGNOSIS — Z125 Encounter for screening for malignant neoplasm of prostate: Secondary | ICD-10-CM | POA: Diagnosis not present

## 2012-10-01 DIAGNOSIS — E785 Hyperlipidemia, unspecified: Secondary | ICD-10-CM | POA: Diagnosis not present

## 2012-10-10 DIAGNOSIS — R351 Nocturia: Secondary | ICD-10-CM | POA: Diagnosis not present

## 2012-10-10 DIAGNOSIS — Z0000 Encounter for general adult medical examination without abnormal findings: Secondary | ICD-10-CM | POA: Diagnosis not present

## 2012-10-10 DIAGNOSIS — D126 Benign neoplasm of colon, unspecified: Secondary | ICD-10-CM | POA: Diagnosis not present

## 2012-10-10 DIAGNOSIS — F411 Generalized anxiety disorder: Secondary | ICD-10-CM | POA: Diagnosis not present

## 2012-10-10 DIAGNOSIS — Z1331 Encounter for screening for depression: Secondary | ICD-10-CM | POA: Diagnosis not present

## 2012-10-10 DIAGNOSIS — E785 Hyperlipidemia, unspecified: Secondary | ICD-10-CM | POA: Diagnosis not present

## 2012-10-10 DIAGNOSIS — Z125 Encounter for screening for malignant neoplasm of prostate: Secondary | ICD-10-CM | POA: Diagnosis not present

## 2012-10-10 DIAGNOSIS — I10 Essential (primary) hypertension: Secondary | ICD-10-CM | POA: Diagnosis not present

## 2012-10-10 DIAGNOSIS — I872 Venous insufficiency (chronic) (peripheral): Secondary | ICD-10-CM | POA: Diagnosis not present

## 2013-04-22 DIAGNOSIS — D485 Neoplasm of uncertain behavior of skin: Secondary | ICD-10-CM | POA: Diagnosis not present

## 2013-04-22 DIAGNOSIS — Z85828 Personal history of other malignant neoplasm of skin: Secondary | ICD-10-CM | POA: Diagnosis not present

## 2013-04-22 DIAGNOSIS — D2330 Other benign neoplasm of skin of unspecified part of face: Secondary | ICD-10-CM | POA: Diagnosis not present

## 2013-04-22 DIAGNOSIS — L821 Other seborrheic keratosis: Secondary | ICD-10-CM | POA: Diagnosis not present

## 2013-05-01 DIAGNOSIS — IMO0002 Reserved for concepts with insufficient information to code with codable children: Secondary | ICD-10-CM | POA: Diagnosis not present

## 2013-06-03 ENCOUNTER — Other Ambulatory Visit: Payer: Self-pay | Admitting: Dermatology

## 2013-06-03 DIAGNOSIS — D485 Neoplasm of uncertain behavior of skin: Secondary | ICD-10-CM | POA: Diagnosis not present

## 2013-06-03 DIAGNOSIS — C4441 Basal cell carcinoma of skin of scalp and neck: Secondary | ICD-10-CM | POA: Diagnosis not present

## 2013-06-03 DIAGNOSIS — Z85828 Personal history of other malignant neoplasm of skin: Secondary | ICD-10-CM | POA: Diagnosis not present

## 2013-06-03 DIAGNOSIS — C44319 Basal cell carcinoma of skin of other parts of face: Secondary | ICD-10-CM | POA: Diagnosis not present

## 2013-06-05 DIAGNOSIS — H00019 Hordeolum externum unspecified eye, unspecified eyelid: Secondary | ICD-10-CM | POA: Diagnosis not present

## 2013-10-08 DIAGNOSIS — Z125 Encounter for screening for malignant neoplasm of prostate: Secondary | ICD-10-CM | POA: Diagnosis not present

## 2013-10-08 DIAGNOSIS — I10 Essential (primary) hypertension: Secondary | ICD-10-CM | POA: Diagnosis not present

## 2013-10-08 DIAGNOSIS — E785 Hyperlipidemia, unspecified: Secondary | ICD-10-CM | POA: Diagnosis not present

## 2013-10-08 DIAGNOSIS — Z79899 Other long term (current) drug therapy: Secondary | ICD-10-CM | POA: Diagnosis not present

## 2013-10-14 DIAGNOSIS — E785 Hyperlipidemia, unspecified: Secondary | ICD-10-CM | POA: Diagnosis not present

## 2013-10-14 DIAGNOSIS — Z6832 Body mass index (BMI) 32.0-32.9, adult: Secondary | ICD-10-CM | POA: Diagnosis not present

## 2013-10-14 DIAGNOSIS — F411 Generalized anxiety disorder: Secondary | ICD-10-CM | POA: Diagnosis not present

## 2013-10-14 DIAGNOSIS — M25519 Pain in unspecified shoulder: Secondary | ICD-10-CM | POA: Diagnosis not present

## 2013-10-14 DIAGNOSIS — I10 Essential (primary) hypertension: Secondary | ICD-10-CM | POA: Diagnosis not present

## 2013-10-14 DIAGNOSIS — Z0000 Encounter for general adult medical examination without abnormal findings: Secondary | ICD-10-CM | POA: Diagnosis not present

## 2013-10-14 DIAGNOSIS — J069 Acute upper respiratory infection, unspecified: Secondary | ICD-10-CM | POA: Diagnosis not present

## 2013-10-16 DIAGNOSIS — H2510 Age-related nuclear cataract, unspecified eye: Secondary | ICD-10-CM | POA: Diagnosis not present

## 2013-10-16 DIAGNOSIS — H52229 Regular astigmatism, unspecified eye: Secondary | ICD-10-CM | POA: Diagnosis not present

## 2013-10-16 DIAGNOSIS — H5200 Hypermetropia, unspecified eye: Secondary | ICD-10-CM | POA: Diagnosis not present

## 2013-12-08 ENCOUNTER — Other Ambulatory Visit: Payer: Self-pay | Admitting: Dermatology

## 2013-12-08 DIAGNOSIS — D485 Neoplasm of uncertain behavior of skin: Secondary | ICD-10-CM | POA: Diagnosis not present

## 2013-12-08 DIAGNOSIS — C44319 Basal cell carcinoma of skin of other parts of face: Secondary | ICD-10-CM | POA: Diagnosis not present

## 2013-12-08 DIAGNOSIS — Z85828 Personal history of other malignant neoplasm of skin: Secondary | ICD-10-CM | POA: Diagnosis not present

## 2013-12-08 DIAGNOSIS — L820 Inflamed seborrheic keratosis: Secondary | ICD-10-CM | POA: Diagnosis not present

## 2013-12-08 DIAGNOSIS — L821 Other seborrheic keratosis: Secondary | ICD-10-CM | POA: Diagnosis not present

## 2013-12-08 DIAGNOSIS — L570 Actinic keratosis: Secondary | ICD-10-CM | POA: Diagnosis not present

## 2014-02-25 DIAGNOSIS — H01003 Unspecified blepharitis right eye, unspecified eyelid: Secondary | ICD-10-CM | POA: Diagnosis not present

## 2014-03-11 ENCOUNTER — Other Ambulatory Visit: Payer: Self-pay | Admitting: Dermatology

## 2014-03-11 DIAGNOSIS — C44311 Basal cell carcinoma of skin of nose: Secondary | ICD-10-CM | POA: Diagnosis not present

## 2014-03-11 DIAGNOSIS — Z85828 Personal history of other malignant neoplasm of skin: Secondary | ICD-10-CM | POA: Diagnosis not present

## 2014-03-11 DIAGNOSIS — L821 Other seborrheic keratosis: Secondary | ICD-10-CM | POA: Diagnosis not present

## 2014-03-11 DIAGNOSIS — D485 Neoplasm of uncertain behavior of skin: Secondary | ICD-10-CM | POA: Diagnosis not present

## 2014-03-31 DIAGNOSIS — H00029 Hordeolum internum unspecified eye, unspecified eyelid: Secondary | ICD-10-CM | POA: Diagnosis not present

## 2014-03-31 DIAGNOSIS — H01009 Unspecified blepharitis unspecified eye, unspecified eyelid: Secondary | ICD-10-CM | POA: Diagnosis not present

## 2014-04-23 DIAGNOSIS — Z85828 Personal history of other malignant neoplasm of skin: Secondary | ICD-10-CM | POA: Diagnosis not present

## 2014-04-23 DIAGNOSIS — C44311 Basal cell carcinoma of skin of nose: Secondary | ICD-10-CM | POA: Diagnosis not present

## 2014-05-20 DIAGNOSIS — H04123 Dry eye syndrome of bilateral lacrimal glands: Secondary | ICD-10-CM | POA: Diagnosis not present

## 2014-07-01 DIAGNOSIS — M9903 Segmental and somatic dysfunction of lumbar region: Secondary | ICD-10-CM | POA: Diagnosis not present

## 2014-07-01 DIAGNOSIS — M5432 Sciatica, left side: Secondary | ICD-10-CM | POA: Diagnosis not present

## 2014-07-02 DIAGNOSIS — M9903 Segmental and somatic dysfunction of lumbar region: Secondary | ICD-10-CM | POA: Diagnosis not present

## 2014-07-02 DIAGNOSIS — M5432 Sciatica, left side: Secondary | ICD-10-CM | POA: Diagnosis not present

## 2014-07-14 DIAGNOSIS — M9903 Segmental and somatic dysfunction of lumbar region: Secondary | ICD-10-CM | POA: Diagnosis not present

## 2014-07-14 DIAGNOSIS — M5432 Sciatica, left side: Secondary | ICD-10-CM | POA: Diagnosis not present

## 2014-07-16 DIAGNOSIS — M5432 Sciatica, left side: Secondary | ICD-10-CM | POA: Diagnosis not present

## 2014-07-16 DIAGNOSIS — M9903 Segmental and somatic dysfunction of lumbar region: Secondary | ICD-10-CM | POA: Diagnosis not present

## 2014-07-21 DIAGNOSIS — M9903 Segmental and somatic dysfunction of lumbar region: Secondary | ICD-10-CM | POA: Diagnosis not present

## 2014-07-21 DIAGNOSIS — M5432 Sciatica, left side: Secondary | ICD-10-CM | POA: Diagnosis not present

## 2014-07-29 DIAGNOSIS — M5432 Sciatica, left side: Secondary | ICD-10-CM | POA: Diagnosis not present

## 2014-07-29 DIAGNOSIS — M9903 Segmental and somatic dysfunction of lumbar region: Secondary | ICD-10-CM | POA: Diagnosis not present

## 2014-08-05 DIAGNOSIS — M5432 Sciatica, left side: Secondary | ICD-10-CM | POA: Diagnosis not present

## 2014-08-05 DIAGNOSIS — M9903 Segmental and somatic dysfunction of lumbar region: Secondary | ICD-10-CM | POA: Diagnosis not present

## 2014-08-11 DIAGNOSIS — M9903 Segmental and somatic dysfunction of lumbar region: Secondary | ICD-10-CM | POA: Diagnosis not present

## 2014-08-11 DIAGNOSIS — M5432 Sciatica, left side: Secondary | ICD-10-CM | POA: Diagnosis not present

## 2014-08-13 ENCOUNTER — Other Ambulatory Visit: Payer: Self-pay | Admitting: Dermatology

## 2014-08-13 DIAGNOSIS — L821 Other seborrheic keratosis: Secondary | ICD-10-CM | POA: Diagnosis not present

## 2014-08-13 DIAGNOSIS — C4441 Basal cell carcinoma of skin of scalp and neck: Secondary | ICD-10-CM | POA: Diagnosis not present

## 2014-08-13 DIAGNOSIS — L814 Other melanin hyperpigmentation: Secondary | ICD-10-CM | POA: Diagnosis not present

## 2014-08-13 DIAGNOSIS — D485 Neoplasm of uncertain behavior of skin: Secondary | ICD-10-CM | POA: Diagnosis not present

## 2014-08-13 DIAGNOSIS — L720 Epidermal cyst: Secondary | ICD-10-CM | POA: Diagnosis not present

## 2014-08-13 DIAGNOSIS — D1801 Hemangioma of skin and subcutaneous tissue: Secondary | ICD-10-CM | POA: Diagnosis not present

## 2014-08-13 DIAGNOSIS — L570 Actinic keratosis: Secondary | ICD-10-CM | POA: Diagnosis not present

## 2014-08-13 DIAGNOSIS — Z85828 Personal history of other malignant neoplasm of skin: Secondary | ICD-10-CM | POA: Diagnosis not present

## 2014-08-18 DIAGNOSIS — M9903 Segmental and somatic dysfunction of lumbar region: Secondary | ICD-10-CM | POA: Diagnosis not present

## 2014-08-18 DIAGNOSIS — M5432 Sciatica, left side: Secondary | ICD-10-CM | POA: Diagnosis not present

## 2014-08-20 ENCOUNTER — Ambulatory Visit (HOSPITAL_COMMUNITY)
Admission: EM | Admit: 2014-08-20 | Discharge: 2014-08-20 | Disposition: A | Discharge status: Home / Self Care | Payer: Medicare Other | Attending: Emergency Medicine | Admitting: Emergency Medicine

## 2014-08-20 ENCOUNTER — Encounter (HOSPITAL_COMMUNITY): Payer: Self-pay | Admitting: *Deleted

## 2014-08-20 ENCOUNTER — Ambulatory Visit (HOSPITAL_COMMUNITY): Admit: 2014-08-20 | Payer: Self-pay | Admitting: Internal Medicine

## 2014-08-20 ENCOUNTER — Emergency Department (HOSPITAL_COMMUNITY): Payer: Medicare Other

## 2014-08-20 ENCOUNTER — Encounter (HOSPITAL_COMMUNITY)
Admission: EM | Disposition: A | Discharge status: Home / Self Care | Payer: Self-pay | Source: Home / Self Care | Attending: Emergency Medicine

## 2014-08-20 ENCOUNTER — Other Ambulatory Visit (HOSPITAL_COMMUNITY): Payer: Self-pay

## 2014-08-20 DIAGNOSIS — M1990 Unspecified osteoarthritis, unspecified site: Secondary | ICD-10-CM | POA: Diagnosis not present

## 2014-08-20 DIAGNOSIS — Y929 Unspecified place or not applicable: Secondary | ICD-10-CM | POA: Insufficient documentation

## 2014-08-20 DIAGNOSIS — Z87891 Personal history of nicotine dependence: Secondary | ICD-10-CM | POA: Insufficient documentation

## 2014-08-20 DIAGNOSIS — Z7982 Long term (current) use of aspirin: Secondary | ICD-10-CM | POA: Diagnosis not present

## 2014-08-20 DIAGNOSIS — F419 Anxiety disorder, unspecified: Secondary | ICD-10-CM | POA: Diagnosis not present

## 2014-08-20 DIAGNOSIS — R079 Chest pain, unspecified: Secondary | ICD-10-CM | POA: Diagnosis not present

## 2014-08-20 DIAGNOSIS — T18128A Food in esophagus causing other injury, initial encounter: Secondary | ICD-10-CM | POA: Diagnosis not present

## 2014-08-20 DIAGNOSIS — W44F3XA Food entering into or through a natural orifice, initial encounter: Secondary | ICD-10-CM

## 2014-08-20 DIAGNOSIS — X58XXXA Exposure to other specified factors, initial encounter: Secondary | ICD-10-CM | POA: Insufficient documentation

## 2014-08-20 DIAGNOSIS — Z79899 Other long term (current) drug therapy: Secondary | ICD-10-CM | POA: Diagnosis not present

## 2014-08-20 DIAGNOSIS — I10 Essential (primary) hypertension: Secondary | ICD-10-CM | POA: Diagnosis not present

## 2014-08-20 DIAGNOSIS — T18108A Unspecified foreign body in esophagus causing other injury, initial encounter: Secondary | ICD-10-CM | POA: Diagnosis not present

## 2014-08-20 DIAGNOSIS — R072 Precordial pain: Secondary | ICD-10-CM | POA: Diagnosis not present

## 2014-08-20 DIAGNOSIS — Z85828 Personal history of other malignant neoplasm of skin: Secondary | ICD-10-CM | POA: Diagnosis not present

## 2014-08-20 HISTORY — PX: ESOPHAGOGASTRODUODENOSCOPY: SHX5428

## 2014-08-20 HISTORY — DX: Food entering into or through a natural orifice, initial encounter: W44.F3XA

## 2014-08-20 HISTORY — DX: Food in esophagus causing other injury, initial encounter: T18.128A

## 2014-08-20 LAB — CBC
HCT: 49.3 % (ref 39.0–52.0)
Hemoglobin: 16.7 g/dL (ref 13.0–17.0)
MCH: 31.6 pg (ref 26.0–34.0)
MCHC: 33.9 g/dL (ref 30.0–36.0)
MCV: 93.4 fL (ref 78.0–100.0)
Platelets: 189 10*3/uL (ref 150–400)
RBC: 5.28 MIL/uL (ref 4.22–5.81)
RDW: 12.8 % (ref 11.5–15.5)
WBC: 8.9 10*3/uL (ref 4.0–10.5)

## 2014-08-20 LAB — COMPREHENSIVE METABOLIC PANEL
ALT: 19 U/L (ref 0–53)
ANION GAP: 9 (ref 5–15)
AST: 23 U/L (ref 0–37)
Albumin: 4.2 g/dL (ref 3.5–5.2)
Alkaline Phosphatase: 73 U/L (ref 39–117)
BILIRUBIN TOTAL: 0.7 mg/dL (ref 0.3–1.2)
BUN: 19 mg/dL (ref 6–23)
CO2: 26 mmol/L (ref 19–32)
CREATININE: 0.92 mg/dL (ref 0.50–1.35)
Calcium: 9.2 mg/dL (ref 8.4–10.5)
Chloride: 110 mmol/L (ref 96–112)
GFR calc Af Amer: >90 mL/min (ref >90)
GFR calc non Af Amer: 82 mL/min — ABNORMAL LOW (ref >90)
Glucose, Bld: 105 mg/dL — ABNORMAL HIGH (ref 70–99)
Potassium: 3.9 mmol/L (ref 3.5–5.1)
Sodium: 145 mmol/L (ref 135–145)
TOTAL PROTEIN: 7.4 g/dL (ref 6.0–8.3)

## 2014-08-20 LAB — I-STAT TROPONIN, ED: Troponin i, poc: 0 ng/mL (ref 0.00–0.08)

## 2014-08-20 SURGERY — EGD (ESOPHAGOGASTRODUODENOSCOPY)
Anesthesia: Moderate Sedation

## 2014-08-20 MED ORDER — ESOMEPRAZOLE MAGNESIUM 40 MG PO CPDR: 40.0000 mg | DELAYED_RELEASE_CAPSULE | Freq: Every day | ORAL | Status: DC

## 2014-08-20 MED ORDER — MORPHINE SULFATE 4 MG/ML IJ SOLN
4.0000 mg | Freq: Once | INTRAMUSCULAR | Status: AC
  Administered 2014-08-20: 4 mg via INTRAVENOUS
  Filled 2014-08-20: qty 1

## 2014-08-20 MED ORDER — MIDAZOLAM HCL 5 MG/ML IJ SOLN
INTRAMUSCULAR | Status: AC
  Filled 2014-08-20: qty 2

## 2014-08-20 MED ORDER — ESOMEPRAZOLE MAGNESIUM 40 MG PO PACK: 40.0000 mg | PACK | Freq: Every day | ORAL | Status: DC

## 2014-08-20 MED ORDER — ONDANSETRON HCL 4 MG/2ML IJ SOLN
4.0000 mg | Freq: Once | INTRAMUSCULAR | Status: AC
  Administered 2014-08-20: 4 mg via INTRAVENOUS
  Filled 2014-08-20: qty 2

## 2014-08-20 MED ORDER — FENTANYL CITRATE (PF) 100 MCG/2ML IJ SOLN
INTRAMUSCULAR | Status: AC
  Filled 2014-08-20: qty 2

## 2014-08-20 MED ORDER — SODIUM CHLORIDE 0.9 % IV SOLN
INTRAVENOUS | Status: DC
  Administered 2014-08-20: 500 mL via INTRAVENOUS

## 2014-08-20 MED ORDER — STERILE WATER FOR INJECTION IJ SOLN
INTRAMUSCULAR | Status: AC
  Filled 2014-08-20: qty 10

## 2014-08-20 MED ORDER — BUTAMBEN-TETRACAINE-BENZOCAINE 2-2-14 % EX AERO
INHALATION_SPRAY | CUTANEOUS | Status: DC | PRN
  Administered 2014-08-20: 1 via TOPICAL

## 2014-08-20 MED ORDER — GLUCAGON HCL RDNA (DIAGNOSTIC) 1 MG IJ SOLR
1.0000 mg | Freq: Once | INTRAMUSCULAR | Status: AC
Start: 2014-08-20 — End: 2014-08-20
  Administered 2014-08-20: 1 mg via INTRAVENOUS
  Filled 2014-08-20: qty 1

## 2014-08-20 MED ORDER — FENTANYL CITRATE (PF) 100 MCG/2ML IJ SOLN
INTRAMUSCULAR | Status: DC | PRN
  Administered 2014-08-20 (×2): 25 ug via INTRAVENOUS

## 2014-08-20 MED ORDER — MIDAZOLAM HCL 10 MG/2ML IJ SOLN
INTRAMUSCULAR | Status: DC | PRN
  Administered 2014-08-20 (×2): 2 mg via INTRAVENOUS

## 2014-08-20 MED ORDER — DIPHENHYDRAMINE HCL 50 MG/ML IJ SOLN
INTRAMUSCULAR | Status: AC
  Filled 2014-08-20: qty 1

## 2014-08-20 MED ORDER — ONDANSETRON 4 MG PO TBDP
8.0000 mg | ORAL_TABLET | Freq: Once | ORAL | Status: AC
  Administered 2014-08-20: 8 mg via ORAL
  Filled 2014-08-20: qty 2

## 2014-08-20 NOTE — Discharge Instructions (Addendum)
° °  I found food lodged where the esophagus and stomach meet. I pushed it into the stomach. There area there is inflamed but should heal. I suspect it is narrow and will need dilation.  My office will call you and arrange follow-up with Dr. Henrene Pastor - probably just go ahead with endoscopy and possible dilation if Dr. Henrene Pastor agrees with me.  Please take only liquids tonight and try soft foods in AM.  Follow the dysphagia 3 diet instructions also.  I have sent a prescription for Nexium to [facility] - pick up tomorrow please. I appreciate the opportunity to care for you. Gatha Mayer, MD, FACG  YOU HAD AN ENDOSCOPIC PROCEDURE TODAY: Refer to the procedure report and other information in the discharge instructions given to you for any specific questions about what was found during the examination. If this information does not answer your questions, please call Dr. Celesta Aver office at [phone] to clarify.   YOU SHOULD EXPECT: Some feelings of bloating in the abdomen. Passage of more gas than usual. Walking can help get rid of the air that was put into your GI tract during the procedure and reduce the bloating. If you had a lower endoscopy (such as a colonoscopy or flexible sigmoidoscopy) you may notice spotting of blood in your stool or on the toilet paper. Some abdominal soreness may be present for a day or two, also.  ACTIVITY: Your care partner should take you home directly after the procedure. You should plan to take it easy, moving slowly for the rest of the day. You can resume normal activity the day after the procedure however YOU SHOULD NOT DRIVE, use power tools, machinery or perform tasks that involve climbing or major physical exertion for 24 hours (because of the sedation medicines used during the test).   SYMPTOMS TO REPORT IMMEDIATELY: A gastroenterologist can be reached at any hour. Please call [phone]  for any of the following symptoms:  Following lower endoscopy  (colonoscopy, flexible sigmoidoscopy) Excessive amounts of blood in the stool  Significant tenderness, worsening of abdominal pains  Swelling of the abdomen that is new, acute  Fever of 100 or higher  Following upper endoscopy (EGD, EUS, ERCP, esophageal dilation) Vomiting of blood or coffee ground material  New, significant abdominal pain  New, significant chest pain or pain under the shoulder blades  Painful or persistently difficult swallowing  New shortness of breath  Black, tarry-looking or red, bloody stools  FOLLOW UP:  If any biopsies were taken you will be contacted by phone or by letter within the next 1-3 weeks. Call [phone]  if you have not heard about the biopsies in 3 weeks.  Please also call with any specific questions about appointments or follow up tests.

## 2014-08-20 NOTE — H&P (Signed)
[facility] History and Physical   Primary Care Physician:  Marton Redwood, MD   Reason for Procedure:   Remove food impaction  Plan:    Upper endoscopy and foreign body removal - possible dilation The risks and benefits as well as alternatives of endoscopic procedure(s) have been discussed and reviewed. All questions answered. The patient agrees to proceed.       HPI: David [name] is a 74 y.o. male who has not been able to swallow for 12 + hours after breakfast of scrambled eggs bacon and muffin.. [employer] chicken went down ok last PM.  Intermittent solid dysphagia x 3 months.    Past Medical History  Diagnosis Date  . Hypertension   . Anxiety   . Arthritis   . Basal cell carcinoma     nose, face, back    Past Surgical History  Procedure Laterality Date  . Total knee arthroplasty  2010    right  . Cervical laminectomy  1980  . Appendectomy  1961  . Colonoscopy      Prior to Admission medications   Medication Sig Start Date End Date Taking? Authorizing Provider  ALPRAZolam Duanne Moron) 0.5 MG tablet Take 0.5 mg by mouth 2 (two) times daily as needed for anxiety.  07/22/14  Yes Historical Provider, MD  amLODipine (NORVASC) 10 MG tablet Take 10 mg by mouth daily.   Yes Historical Provider, MD  aspirin 81 MG tablet Take 81 mg by mouth daily.   Yes Historical Provider, MD  benazepril (LOTENSIN) 40 MG tablet Take 40 mg by mouth daily.   Yes Historical Provider, MD  furosemide (LASIX) 40 MG tablet Take 40 mg by mouth daily. 07/16/14  Yes Historical Provider, MD  ibuprofen (ADVIL,MOTRIN) 200 MG tablet Take 200 mg by mouth every 6 (six) hours as needed for moderate pain.   Yes Historical Provider, MD  Liniments (DEEP BLUE RELIEF) GEL Apply 1 application topically daily as needed (FOR PAIN).   Yes Historical Provider, MD  Misc Natural Products (TURMERIC CURCUMIN) CAPS Take 1 capsule by mouth daily.   Yes Historical Provider, MD  Multiple Vitamin (MULTIVITAMIN) tablet  Take 1 tablet by mouth daily.   Yes Historical Provider, MD  OVER THE COUNTER MEDICATION Apply 1 application topically daily as needed ("ESSENTIAL OILS").   Yes Historical Provider, MD  OVER THE COUNTER MEDICATION Place 1 drop into both eyes as needed (COMPOUNDED [place]).    Yes Historical Provider, MD  Tamsulosin HCl (FLOMAX) 0.4 MG CAPS Take 0.4 mg by mouth daily after supper.   Yes Historical Provider, MD  Vitamin E (VITA-PLUS E PO) Take by mouth daily.   Yes Historical Provider, MD  peg 3350 powder (MOVIPREP) 100 G SOLR moviprep as directed 06/08/11   Irene Shipper, MD    Current Facility-Administered Medications  Medication Dose Route Frequency Provider Last Rate Last Dose  . 0.9 %  sodium chloride infusion   Intravenous Continuous Gatha Mayer, MD        Allergies as of 08/20/2014  . (No Known Allergies)    Family History  Problem Relation Age of Onset  . Stomach cancer Neg Hx   . Colon cancer Neg Hx     History   Social History  . Marital Status: Married    Spouse Name: [name]  . Number of Children: [name]  . Years of Education: [name]   Occupational History  . Not on file.   Social History Main Topics  .  Smoking status: Former Smoker    Quit date: 06/07/1961  . Smokeless tobacco: Never Used  . Alcohol Use: No  . Drug Use: No  . Sexual Activity: Not on file   Other Topics Concern  . Not on file   Social History Narrative    Review of Systems: Positive for eyeglasses All other review of systems negative except as mentioned in the HPI.  Physical Exam: Vital signs in last 24 hours: Temp:  [98 F (36.7 C)-98.2 F (36.8 C)] 98.2 F (36.8 C) (04/28 2228) Pulse Rate:  [85-93] 92 (04/28 2228) Resp:  [12-23] 14 (04/28 2228) BP: (139-172)/(70-90) 163/70 mmHg (04/28 2228) SpO2:  [88 %-99 %] 94 % (04/28 2228) Weight:  [237 lb (107.502 kg)] 237 lb (107.502 kg) (04/28 1554)   General:   Alert,  Well-developed, well-nourished, pleasant and cooperative  in NAD Lungs:  Clear throughout to auscultation.   Heart:  Regular rate and rhythm; no murmurs, clicks, rubs,  or gallops. Abdomen:  Soft, nontender and nondistended. Normal bowel sounds.   Neuro/Psych:  Alert and cooperative. Normal mood and affect. A and O x 3   @David [name]  Simonne Maffucci, MD, [facility] [phone] (pager) 08/20/2014 10:31 PM@

## 2014-08-20 NOTE — ED Provider Notes (Signed)
CSN: [id number]     Arrival date & time 08/20/14  1547 History   First MD Initiated Contact with Patient 08/20/14 1729     Chief Complaint  Patient presents with  . Chest Pain     (Consider location/radiation/quality/duration/timing/severity/associated sxs/prior Treatment) Patient is a 74 y.o. male presenting with chest pain.  Chest Pain Pain location:  Substernal area Pain quality comment:  Squeezing Pain radiates to:  Does not radiate Pain severity:  Severe Onset quality:  Sudden Duration:  8 hours Timing:  Constant Progression:  Waxing and waning Context comment:  Began at the end of breakfast.  Has had similar symptoms in the past associated with eating. Relieved by: warm compress to chest. Exacerbated by: swallowing. Associated symptoms: nausea   Associated symptoms: no abdominal pain, no diaphoresis, no fever and no shortness of breath   Associated symptoms comment:  Hiccups.  Unable to swallow saliva.   Past Medical History  Diagnosis Date  . Hypertension   . Anxiety   . Arthritis   . Basal cell carcinoma     nose, face, back   Past Surgical History  Procedure Laterality Date  . Total knee arthroplasty  2010    right  . Cervical laminectomy  1980  . Appendectomy  1961   Family History  Problem Relation Age of Onset  . Stomach cancer Neg Hx   . Colon cancer Neg Hx    History  Substance Use Topics  . Smoking status: Former Smoker    Quit date: 06/07/1961  . Smokeless tobacco: Never Used  . Alcohol Use: No    Review of Systems  Constitutional: Negative for fever and diaphoresis.  Respiratory: Negative for shortness of breath.   Cardiovascular: Positive for chest pain.  Gastrointestinal: Positive for nausea. Negative for abdominal pain.  All other systems reviewed and are negative.     Allergies  Review of patient's allergies indicates no known allergies.  Home Medications   Prior to Admission medications   Medication Sig Start Date End Date  Taking? Authorizing Provider  amLODipine (NORVASC) 10 MG tablet Take 10 mg by mouth daily.   Yes Historical Provider, MD  aspirin 81 MG tablet Take 81 mg by mouth daily.   Yes Historical Provider, MD  benazepril (LOTENSIN) 40 MG tablet Take 40 mg by mouth daily.   Yes Historical Provider, MD  hydrochlorothiazide (HYDRODIURIL) 25 MG tablet Take 25 mg by mouth daily.   Yes Historical Provider, MD  ibuprofen (ADVIL,MOTRIN) 200 MG tablet Take 200 mg by mouth every 6 (six) hours as needed for moderate pain.   Yes Historical Provider, MD  Liniments (DEEP BLUE RELIEF) GEL Apply 1 application topically daily as needed (FOR PAIN).   Yes Historical Provider, MD  Misc Natural Products (TURMERIC CURCUMIN) CAPS Take 1 capsule by mouth daily.   Yes Historical Provider, MD  Multiple Vitamin (MULTIVITAMIN) tablet Take 1 tablet by mouth daily.   Yes Historical Provider, MD  OVER THE COUNTER MEDICATION Apply 1 application topically daily as needed ("ESSENTIAL OILS").   Yes Historical Provider, MD  Tamsulosin HCl (FLOMAX) 0.4 MG CAPS Take 0.4 mg by mouth daily after supper.   Yes Historical Provider, MD  Vitamin E (VITA-PLUS E PO) Take by mouth daily.   Yes Historical Provider, MD  Cholecalciferol (VITAMIN D) 2000 UNITS CAPS Take by mouth. Takes 2 caplets daily    Historical Provider, MD  peg 3350 powder (MOVIPREP) 100 G SOLR moviprep as directed 06/08/11   Irene Shipper,  MD   BP 146/80 mmHg  Pulse 85  Temp(Src) 98 F (36.7 C) (Oral)  Resp 14  Ht 6' 3.5" (1.918 m)  Wt 237 lb (107.502 kg)  BMI 29.22 kg/m2  SpO2 97% Physical Exam  Constitutional: He is oriented to person, place, and time. He appears well-developed and well-nourished. He appears distressed (appears very uncomfortable).  HENT:  Head: Normocephalic and atraumatic.  Mouth/Throat: Oropharynx is clear and moist.  Eyes: Conjunctivae are normal. Pupils are equal, round, and reactive to light. No scleral icterus.  Neck: Neck supple.  Cardiovascular:  Normal rate, regular rhythm, normal heart sounds and intact distal pulses.   No murmur heard. Pulmonary/Chest: Effort normal and breath sounds normal. No stridor. No respiratory distress. He has no wheezes. He has no rales.  Abdominal: Soft. He exhibits no distension. There is no tenderness.  Musculoskeletal: Normal range of motion. He exhibits no edema.  Neurological: He is alert and oriented to person, place, and time.  Skin: Skin is warm and dry. No rash noted.  Psychiatric: He has a normal mood and affect. His behavior is normal.  Nursing note and vitals reviewed.   ED Course  Procedures (including critical care time) Labs Review Labs Reviewed  COMPREHENSIVE METABOLIC PANEL - Abnormal; Notable for the following:    Glucose, Bld 105 (*)    GFR calc non Af Amer 82 (*)    All other components within normal limits  CBC  I-STAT TROPOININ, ED    Imaging Review Dg Chest 2 View  08/20/2014   CLINICAL DATA:  Sudden onset of vomiting and stabbing chest pain.  EXAM: CHEST  2 VIEW  COMPARISON:  Radiograph 06/16/2009  FINDINGS: Borderline enlarged cardiac silhouette. There is chronic bronchitic markings centrally. There is subtle nodularity within the right lower lobe. No pneumothorax. Degenerative spurring of the thoracic spine.  IMPRESSION: Right lower lobe nodularity with differential including nodular airspace disease / pneumonia versus pulmonary nodules. Recommend follow-up radiographs following appropriate therapy for pneumonia. If patient does not have symptoms of pneumonia, a more immediate CT of thorax could be considered.   Electronically Signed   By: Suzy Bouchard M.D.   On: 08/20/2014 17:55     EKG Interpretation None      EKG - NSR, rate 89, normal axis, normal intervals, nonspecific t wave abnormality, no significant change from prior.   MDM   Final diagnoses:  Food impaction of esophagus, initial encounter    Apparent food bolus impaction.  Will try glucagon.    Glucagon not effective.  IV morphine helped with pain, but had to be redosed.  Discussed with GI, Dr. Carlean Purl, who has taken for endoscopy.    Serita Grit, MD 08/21/14 [phone]

## 2014-08-20 NOTE — ED Notes (Signed)
Pt states difficulty swallowing since this am (states hx of difficulty swallowing for several months).  States for past several hours c/o severe sub-sternal chest pain for several hours.  Also c/o nausea.

## 2014-08-20 NOTE — Op Note (Signed)
[facility] [place], [address]   ENDOSCOPY PROCEDURE REPORT  PATIENT: David [name], David [name]  MR#: [id number] BIRTHDATE: 06/17/1940 , 74  yrs. old GENDER: male ENDOSCOPIST: Gatha Mayer, MD, [facility] PROCEDURE DATE:  08/20/2014 PROCEDURE:  Esophagoscopy   + foreign body removal (food impaction) ASA CLASS:     Class II INDICATIONS:  therapeutic procedure.  remove food impaction MEDICATIONS: Fentanyl 50 mcg IV and Versed 4 mg IV TOPICAL ANESTHETIC: Cetacaine Spray  DESCRIPTION OF PROCEDURE: After the risks benefits and alternatives of the procedure were thoroughly explained, informed consent was obtained.  The Pentax Gastroscope [id number] endoscope was introduced through the mouth and advanced to the stomach antrum , Without limitations.  The instrument was slowly withdrawn as the mucosa was fully examined.    1) Food and fluid in mid and distal esophagus with dilated lumen 2) Some food and fluid suctioned revealing firm bolus impacted in region of distal esophagus and GE junction.  This was gently advanced into stomach revealing friable area of mucosa in distal esophagus with edema.  No clear stricture seen but there was stenosis. 3) Normal proximal stomach.  Retroflexed views revealed no abnormalities.     The scope was then withdrawn from the patient and the procedure completed.  COMPLICATIONS: There were no immediate complications.  ENDOSCOPIC IMPRESSION: 1) Food and fluid in mid and distal esophagus with dilated lumen 2) Some food and fluid suctioned revealing firm bolus impacted in region of distal esophagus and GE junction.  This was gently advanced into stomach revealing friable area of mucosa in distal esophagus with edema.  No clear stricture seen but there was stenosis. 3) Normal proximal stomach  RECOMMENDATIONS: Liquids only tonight and soft foods tomorrow dysphagia 3 diet after that start esomeprazole 40 mg dail for now We  will contact from office to arrange f/u with Dr.  Henrene Pastor - suspect can arrange for an upper endoscopy with possible essophageal dilation in May or June  REPEAT EXAM:  eSigned:  Gatha Mayer, MD, [facility] 08/20/2014 11:06 PM    CC: Scarlette Shorts, MD, Janalyn Rouse, MD

## 2014-08-21 ENCOUNTER — Encounter (HOSPITAL_COMMUNITY): Payer: Self-pay | Admitting: Internal Medicine

## 2014-08-26 ENCOUNTER — Telehealth: Payer: Self-pay

## 2014-08-26 NOTE — Telephone Encounter (Signed)
Pt scheduled for previsit 09/25/14@10 :30am, EGD with dil scheduled in the [facility] 09/28/14@10am . Pt aware of appts.

## 2014-08-26 NOTE — Telephone Encounter (Signed)
-----   Message from Irene Shipper, MD sent at 08/21/2014  5:04 PM EDT ----- Regarding: FW: Will need EGD Vaughan Basta, Please schedule this patient for EGD with dilation in the [facility] in 3-5 weeks. He should be on a daily PPI. If not, prescribe omeprazole 20 mg daily. Thank you Dr. Henrene Pastor ----- Message -----    From: Gatha Mayer, MD    Sent: 08/20/2014  11:07 PM      To: Irene Shipper, MD Subject: Will need EGD                                  Jenny Reichmann,  Food impaction removed last night  Please see endo report  I thought he could have an EGD with you in May or June if you agree (probably does not need OV)  Glendell Docker

## 2014-09-25 ENCOUNTER — Ambulatory Visit (AMBULATORY_SURGERY_CENTER): Payer: Self-pay

## 2014-09-25 VITALS — Ht 75.5 in | Wt 243.0 lb

## 2014-09-25 DIAGNOSIS — R1314 Dysphagia, pharyngoesophageal phase: Secondary | ICD-10-CM

## 2014-09-25 NOTE — Progress Notes (Signed)
No allergies to eggs or soy No diet/weight loss meds No home oxygen No past problems with anesthesia  Has email  Emmi instructions given for endoscopy

## 2014-09-28 ENCOUNTER — Encounter: Payer: Self-pay | Admitting: Internal Medicine

## 2014-09-28 ENCOUNTER — Ambulatory Visit (AMBULATORY_SURGERY_CENTER): Payer: Medicare Other | Admitting: Internal Medicine

## 2014-09-28 VITALS — BP 168/89 | HR 83 | Temp 97.3°F | Resp 18 | Ht 75.0 in | Wt 243.0 lb

## 2014-09-28 DIAGNOSIS — R1314 Dysphagia, pharyngoesophageal phase: Secondary | ICD-10-CM

## 2014-09-28 DIAGNOSIS — R1310 Dysphagia, unspecified: Secondary | ICD-10-CM

## 2014-09-28 DIAGNOSIS — C155 Malignant neoplasm of lower third of esophagus: Secondary | ICD-10-CM | POA: Diagnosis not present

## 2014-09-28 DIAGNOSIS — R1319 Other dysphagia: Secondary | ICD-10-CM

## 2014-09-28 DIAGNOSIS — K222 Esophageal obstruction: Secondary | ICD-10-CM

## 2014-09-28 DIAGNOSIS — K229 Disease of esophagus, unspecified: Secondary | ICD-10-CM

## 2014-09-28 DIAGNOSIS — I10 Essential (primary) hypertension: Secondary | ICD-10-CM | POA: Diagnosis not present

## 2014-09-28 DIAGNOSIS — K2100 Gastro-esophageal reflux disease with esophagitis, without bleeding: Secondary | ICD-10-CM

## 2014-09-28 MED ORDER — SODIUM CHLORIDE 0.9 % IV SOLN: 500.0000 mL | INTRAVENOUS | Status: DC

## 2014-09-28 NOTE — Op Note (Signed)
[facility]  [address] [place], [address]   ENDOSCOPY PROCEDURE REPORT  PATIENT: David, [name]  MR#: [id number] BIRTHDATE: 1941/01/30 , 74  yrs. old GENDER: male ENDOSCOPIST: Eustace Quail, MD REFERRED BY:  Gatha Mayer, M.D, [facility] PROCEDURE DATE:  09/28/2014 PROCEDURE:  EGD w/ biopsy ASA CLASS:     Class II INDICATIONS:  dysphagia. Recent food impaction 08/20/2010 managed by Dr. Carlean Purl. Prescribe omeprazole with follow-up endoscopy arranged for this time. MEDICATIONS: Monitored anesthesia care and Propofol 150 mg IV TOPICAL ANESTHETIC: none  DESCRIPTION OF PROCEDURE: After the risks benefits and alternatives of the procedure were thoroughly explained, informed consent was obtained.  The LB [id number] endoscope was introduced through the mouth and advanced to the second portion of the duodenum , Without limitations.  The instrument was slowly withdrawn as the mucosa was fully examined.    EXAM:Esophagus revealed a 2 cm eccentric friable mass beginning at the gastroesophageal junction.  There was associated stricturing. Multiple biopsies taken.  Stomach and duodenum were normal. Retroflexed views revealed no abnormalities.     The scope was then withdrawn from the patient and the procedure completed.  COMPLICATIONS: There were no immediate complications.  ENDOSCOPIC IMPRESSION: 1. Distal esophageal mass with esophageal narrowing as described. Rule out carcinoma 2. Dysphagia secondary to the same. No dilation performed.  RECOMMENDATIONS: 1.  Await biopsy results. We will call you when results are available. 2.  Continue omeprazole  REPEAT EXAM:  eSigned:  Eustace Quail, MD 09/28/2014 10:29 AM    CC:W.  Lutricia Feil, MD and The Patient

## 2014-09-28 NOTE — Progress Notes (Signed)
Called to room to assist during endoscopic procedure.  Patient ID and intended procedure confirmed with present staff. Received instructions for my participation in the procedure from the performing physician.  

## 2014-09-28 NOTE — Patient Instructions (Signed)
YOU HAD AN ENDOSCOPIC PROCEDURE TODAY AT [facility]:   Refer to the procedure report that was given to you for any specific questions about what was found during the examination.  If the procedure report does not answer your questions, please call your gastroenterologist to clarify.  If you requested that your care partner not be given the details of your procedure findings, then the procedure report has been included in a sealed envelope for you to review at your convenience later.  YOU SHOULD EXPECT: Some feelings of bloating in the abdomen. Passage of more gas than usual.  Walking can help get rid of the air that was put into your GI tract during the procedure and reduce the bloating. If you had a lower endoscopy (such as a colonoscopy or flexible sigmoidoscopy) you may notice spotting of blood in your stool or on the toilet paper. If you underwent a bowel prep for your procedure, you may not have a normal bowel movement for a few days.  Please Note:  You might notice some irritation and congestion in your nose or some drainage.  This is from the oxygen used during your procedure.  There is no need for concern and it should clear up in a day or so.  SYMPTOMS TO REPORT IMMEDIATELY:    Following upper endoscopy (EGD)  Vomiting of blood or coffee ground material  New chest pain or pain under the shoulder blades  Painful or persistently difficult swallowing  New shortness of breath  Fever of 100F or higher  Black, tarry-looking stools  For urgent or emergent issues, a gastroenterologist can be reached at any hour by calling [phone].   DIET: Your first meal following the procedure should be a small meal and then it is ok to progress to your normal diet. Heavy or fried foods are harder to digest and may make you feel nauseous or bloated.  Likewise, meals heavy in dairy and vegetables can increase bloating.  Drink plenty of fluids but you should avoid alcoholic beverages  for 24 hours.  ACTIVITY:  You should plan to take it easy for the rest of today and you should NOT DRIVE or use heavy machinery until tomorrow (because of the sedation medicines used during the test).    FOLLOW UP: Our staff will call the number listed on your records the next business day following your procedure to check on you and address any questions or concerns that you may have regarding the information given to you following your procedure. If we do not reach you, we will leave a message.  However, if you are feeling well and you are not experiencing any problems, there is no need to return our call.  We will assume that you have returned to your regular daily activities without incident.  If any biopsies were taken you will be contacted by phone or by letter within the next 1-3 weeks.  Please call us at [phone] if you have not heard about the biopsies in 3 weeks.    SIGNATURES/CONFIDENTIALITY: You and/or your care partner have signed paperwork which will be entered into your electronic medical record.  These signatures attest to the fact that that the information above on your After Visit Summary has been reviewed and is understood.  Full responsibility of the confidentiality of this discharge information lies with you and/or your care-partner.  Dr. Henrene Pastor will call you with the results of the biopsies.

## 2014-09-28 NOTE — Progress Notes (Signed)
A/ox3, pleased with MAC, report to RN 

## 2014-09-29 ENCOUNTER — Telehealth: Payer: Self-pay | Admitting: *Deleted

## 2014-09-29 NOTE — Telephone Encounter (Signed)
Message left on f/u callback

## 2014-10-01 ENCOUNTER — Other Ambulatory Visit: Payer: Self-pay

## 2014-10-01 DIAGNOSIS — C159 Malignant neoplasm of esophagus, unspecified: Secondary | ICD-10-CM

## 2014-10-02 ENCOUNTER — Other Ambulatory Visit: Payer: Self-pay

## 2014-10-02 ENCOUNTER — Other Ambulatory Visit (INDEPENDENT_AMBULATORY_CARE_PROVIDER_SITE_OTHER): Payer: Medicare Other

## 2014-10-02 DIAGNOSIS — C159 Malignant neoplasm of esophagus, unspecified: Secondary | ICD-10-CM | POA: Diagnosis not present

## 2014-10-02 LAB — CREATININE, SERUM: Creatinine, Ser: 0.92 mg/dL (ref 0.40–1.50)

## 2014-10-02 LAB — BUN: BUN: 16 mg/dL (ref 6–23)

## 2014-10-05 ENCOUNTER — Telehealth: Payer: Self-pay | Admitting: Internal Medicine

## 2014-10-05 NOTE — Telephone Encounter (Signed)
I don't think this is related to Nexium. Continue Nexium

## 2014-10-05 NOTE — Telephone Encounter (Signed)
Spoke with pt and he is aware. 

## 2014-10-05 NOTE — Telephone Encounter (Signed)
Pt states he has been taking nexium for about a week now. States that his throat is sore and he is having issues with constipation. States he had difficulty sleeping last night due to lots of secretions pooling in his throat. States he had to sleep in a chair last night because of the secretions and not being able to swallow well. Pt wonders if this is being caused by the nexium or if it is related to the cancer. Please advise.

## 2014-10-06 ENCOUNTER — Ambulatory Visit (INDEPENDENT_AMBULATORY_CARE_PROVIDER_SITE_OTHER)
Admission: RE | Admit: 2014-10-06 | Discharge: 2014-10-06 | Disposition: A | Discharge status: Home / Self Care | Payer: Medicare Other | Source: Ambulatory Visit | Attending: Internal Medicine | Admitting: Internal Medicine

## 2014-10-06 DIAGNOSIS — C159 Malignant neoplasm of esophagus, unspecified: Secondary | ICD-10-CM | POA: Diagnosis not present

## 2014-10-06 DIAGNOSIS — K5730 Diverticulosis of large intestine without perforation or abscess without bleeding: Secondary | ICD-10-CM | POA: Diagnosis not present

## 2014-10-06 MED ORDER — IOHEXOL 300 MG/ML  SOLN
100.0000 mL | Freq: Once | INTRAMUSCULAR | Status: AC | PRN
  Administered 2014-10-06: 100 mL via INTRAVENOUS

## 2014-10-07 ENCOUNTER — Other Ambulatory Visit: Payer: Self-pay

## 2014-10-07 ENCOUNTER — Encounter (HOSPITAL_COMMUNITY): Payer: Self-pay | Admitting: *Deleted

## 2014-10-07 DIAGNOSIS — C159 Malignant neoplasm of esophagus, unspecified: Secondary | ICD-10-CM

## 2014-10-07 DIAGNOSIS — C801 Malignant (primary) neoplasm, unspecified: Secondary | ICD-10-CM

## 2014-10-07 MED ORDER — PANTOPRAZOLE SODIUM 40 MG PO TBEC: 40.0000 mg | DELAYED_RELEASE_TABLET | Freq: Every day | ORAL | Status: DC

## 2014-10-07 NOTE — Telephone Encounter (Signed)
Patient contacted about CT results. He brings this up again. He would like to try a different medication. Adamant that the Nexium is responsible for the sore throat and constipation.

## 2014-10-07 NOTE — Telephone Encounter (Signed)
Doc of the day-please advise

## 2014-10-07 NOTE — Telephone Encounter (Signed)
Please try pantoprazole 40 mg instead

## 2014-10-07 NOTE — Telephone Encounter (Signed)
Patient is interested in trying Patoprazole. New Rx sent to [facility]

## 2014-10-12 ENCOUNTER — Telehealth: Payer: Self-pay | Admitting: Oncology

## 2014-10-12 NOTE — Telephone Encounter (Signed)
Patient called and wanted information about Dr. Benay Spice before scheduling, per patient will call back after researching MD and endoscopy on Thursday.

## 2014-10-12 NOTE — Telephone Encounter (Signed)
new patient appt-left message for patient to return call to schedule np appt

## 2014-10-15 ENCOUNTER — Encounter (HOSPITAL_COMMUNITY): Payer: Self-pay | Admitting: Anesthesiology

## 2014-10-15 ENCOUNTER — Ambulatory Visit (HOSPITAL_COMMUNITY): Payer: Medicare Other | Admitting: Anesthesiology

## 2014-10-15 ENCOUNTER — Ambulatory Visit (HOSPITAL_COMMUNITY)
Admission: RE | Admit: 2014-10-15 | Discharge: 2014-10-15 | Disposition: A | Discharge status: Home Health | Payer: Medicare Other | Source: Ambulatory Visit | Attending: Gastroenterology | Admitting: Gastroenterology

## 2014-10-15 ENCOUNTER — Encounter (HOSPITAL_COMMUNITY)
Admission: RE | Disposition: A | Discharge status: Home Health | Payer: Self-pay | Source: Ambulatory Visit | Attending: Gastroenterology

## 2014-10-15 DIAGNOSIS — Z87891 Personal history of nicotine dependence: Secondary | ICD-10-CM | POA: Diagnosis not present

## 2014-10-15 DIAGNOSIS — F419 Anxiety disorder, unspecified: Secondary | ICD-10-CM | POA: Insufficient documentation

## 2014-10-15 DIAGNOSIS — Z888 Allergy status to other drugs, medicaments and biological substances status: Secondary | ICD-10-CM | POA: Diagnosis not present

## 2014-10-15 DIAGNOSIS — C159 Malignant neoplasm of esophagus, unspecified: Secondary | ICD-10-CM

## 2014-10-15 DIAGNOSIS — M1990 Unspecified osteoarthritis, unspecified site: Secondary | ICD-10-CM | POA: Diagnosis not present

## 2014-10-15 DIAGNOSIS — C160 Malignant neoplasm of cardia: Secondary | ICD-10-CM | POA: Diagnosis not present

## 2014-10-15 DIAGNOSIS — Z85828 Personal history of other malignant neoplasm of skin: Secondary | ICD-10-CM | POA: Insufficient documentation

## 2014-10-15 DIAGNOSIS — I10 Essential (primary) hypertension: Secondary | ICD-10-CM | POA: Insufficient documentation

## 2014-10-15 HISTORY — PX: EUS: SHX5427

## 2014-10-15 SURGERY — UPPER ENDOSCOPIC ULTRASOUND (EUS) LINEAR
Anesthesia: Monitor Anesthesia Care

## 2014-10-15 MED ORDER — LACTATED RINGERS IV SOLN
INTRAVENOUS | Status: DC
  Administered 2014-10-15: 12:00:00 via INTRAVENOUS

## 2014-10-15 MED ORDER — PROPOFOL 10 MG/ML IV BOLUS
INTRAVENOUS | Status: DC | PRN
  Administered 2014-10-15: 50 mg via INTRAVENOUS

## 2014-10-15 MED ORDER — PROPOFOL 10 MG/ML IV BOLUS
INTRAVENOUS | Status: AC
  Filled 2014-10-15: qty 20

## 2014-10-15 MED ORDER — PROPOFOL INFUSION 10 MG/ML OPTIME
INTRAVENOUS | Status: DC | PRN
  Administered 2014-10-15: 140 ug/kg/min via INTRAVENOUS

## 2014-10-15 MED ORDER — LIDOCAINE HCL (CARDIAC) 20 MG/ML IV SOLN
INTRAVENOUS | Status: DC | PRN
  Administered 2014-10-15: 50 mg via INTRAVENOUS

## 2014-10-15 MED ORDER — SODIUM CHLORIDE 0.9 % IV SOLN
INTRAVENOUS | Status: DC
Start: 2014-10-15 — End: 2014-10-17

## 2014-10-15 MED ORDER — LIDOCAINE HCL (CARDIAC) 20 MG/ML IV SOLN
INTRAVENOUS | Status: AC
  Filled 2014-10-15: qty 5

## 2014-10-15 NOTE — Transfer of Care (Signed)
Immediate Anesthesia Transfer of Care Note  Patient: David [name]  Procedure(s) Performed: Procedure(s) with comments: UPPER ENDOSCOPIC ULTRASOUND (EUS) LINEAR ([name]) - radial linear  Patient Location: PACU  Anesthesia Type:MAC  Level of Consciousness: awake, alert  and oriented  Airway & Oxygen Therapy: Patient Spontanous Breathing and Patient connected to nasal cannula oxygen  Post-op Assessment: Report given to RN and Post -op Vital signs reviewed and stable  Post vital signs: Reviewed and stable  Last Vitals:  Filed Vitals:   10/15/14 1137  BP: 169/77  Temp: 36.8 C  Resp: 22    Complications: No apparent anesthesia complications

## 2014-10-15 NOTE — Anesthesia Postprocedure Evaluation (Signed)
  Anesthesia Post-op Note  Patient: David [name]  Procedure(s) Performed: Procedure(s) (LRB): UPPER ENDOSCOPIC ULTRASOUND (EUS) LINEAR ([name])  Patient Location: PACU  Anesthesia Type: MAC  Level of Consciousness: awake and alert   Airway and Oxygen Therapy: Patient Spontanous Breathing  Post-op Pain: mild  Post-op Assessment: Post-op Vital signs reviewed, Patient's Cardiovascular Status Stable, Respiratory Function Stable, Patent Airway and No signs of Nausea or vomiting  Last Vitals:  Filed Vitals:   10/15/14 1300  BP: 163/90  Temp:   Resp:     Post-op Vital Signs: stable   Complications: No apparent anesthesia complications

## 2014-10-15 NOTE — Op Note (Signed)
[facility] [place], [address]   ENDOSCOPIC ULTRASOUND PROCEDURE REPORT  PATIENT: David, [name]  MR#: [id number] BIRTHDATE: 01/27/1941  GENDER: male ENDOSCOPIST: Milus Banister, MD REFERRED BY:  Eustace Quail, M.D. PROCEDURE DATE:  10/15/2014 PROCEDURE:   Upper EUS ASA CLASS:      Class II INDICATIONS:   1.  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 MEDICATIONS: Monitored anesthesia care  DESCRIPTION OF PROCEDURE:   After the risks benefits and alternatives of the procedure were  explained, informed consent was obtained. The patient was then placed in the left, lateral, decubitus postion and IV sedation was administered. Throughout the procedure, the patients blood pressure, pulse and oxygen saturations were monitored continuously.  Under direct visualization, the Pentax EUS Radial [id number]  endoscope was introduced through the mouth  and advanced to the gastroesophageal junction .  Water was used as necessary to provide an acoustic interface.  Upon completion of the imaging, water was removed and the patient was sent to the recovery room in satisfactory condition.  Endoscopic findings: 1. Clearly malignant mass at the GE juntion. The mass appears non-circumferential by endocopy, occupying about 1/2-2/3 the circumference of the distal esophagus. This is creating an incomplete GE junction stricture, lumen through the stricture is 5-58mm and so I was unable to evaluate the distal aspect of the tumor.  EUS findings: 1. The mass above was actually circumferntial by EUS, maximum thickness 1.4cm. The mass clear passes into and through the muscularis propria layer of the distal esophagus wall (uT3). 2. There were three suspicious (round, well demarcated, hypoechoic) lymphnodes adjacent to the mass 9uN2).  The largest lymphnode was 1cm across.  ENDOSCOPIC IMPRESSION: [id number] GE junction adenocarcinoma  (Stage IIIb).  The mass is creating an incomplete stricture that was not able to be passed with the echoendoscope (lumen 5-93mm).  RECOMMENDATIONS: He will likely benefit from neoadjuvant chemo/XRT.  He is meeting Dr. Julieanne Manson next week.  _______________________________ eSigned:  Milus Banister, MD 10/15/2014 12:22 PM

## 2014-10-15 NOTE — Anesthesia Preprocedure Evaluation (Signed)
Anesthesia Evaluation  Patient identified by MRN, date of birth, ID band Patient awake    Reviewed: Allergy & Precautions, NPO status , Patient's Chart, lab work & pertinent test results  Airway Mallampati: II  TM Distance: >3 FB Neck ROM: Full    Dental no notable dental hx.    Pulmonary former smoker,  breath sounds clear to auscultation  Pulmonary exam normal       Cardiovascular Exercise Tolerance: Good hypertension, Pt. on medications Normal cardiovascular examRhythm:Regular Rate:Normal     Neuro/Psych negative neurological ROS  negative psych ROS   GI/Hepatic negative GI ROS, Neg liver ROS,   Endo/Other  negative endocrine ROS  Renal/GU negative Renal ROS  negative genitourinary   Musculoskeletal  (+) Arthritis -,   Abdominal   Peds negative pediatric ROS (+)  Hematology negative hematology ROS (+)   Anesthesia Other Findings   Reproductive/Obstetrics negative OB ROS                             Anesthesia Physical Anesthesia Plan  ASA: II  Anesthesia Plan: MAC   Post-op Pain Management:    Induction: Intravenous  Airway Management Planned: Natural Airway  Additional Equipment:   Intra-op Plan:   Post-operative Plan:   Informed Consent: I have reviewed the patients History and Physical, chart, labs and discussed the procedure including the risks, benefits and alternatives for the proposed anesthesia with the patient or authorized representative who has indicated his/her understanding and acceptance.   Dental advisory given  Plan Discussed with: CRNA  Anesthesia Plan Comments:         Anesthesia Quick Evaluation

## 2014-10-15 NOTE — H&P (Signed)
HPI: This is a man recently diagnosed with GE junction adenocarcinoma, no obvious metastatic disease on CT  Chief complaint is esophagus cancer   Past Medical History  Diagnosis Date  . Hypertension   . Anxiety   . Arthritis   . Basal cell carcinoma     nose, face, back  . Food impaction of esophagus 08/20/2014    Past Surgical History  Procedure Laterality Date  . Total knee arthroplasty  2010    right  . Cervical laminectomy  1980  . Appendectomy  1961  . Colonoscopy    . Esophagogastroduodenoscopy [name] 08/20/2014    Procedure: ESOPHAGOGASTRODUODENOSCOPY (EGD);  Surgeon: Gatha Mayer, MD;  Location: [facility] ENDOSCOPY;  Service: Endoscopy;  Laterality: [name];    Current Facility-Administered Medications  Medication Dose Route Frequency Provider Last Rate Last Dose  . 0.9 %  sodium chloride infusion   Intravenous Continuous Milus Banister, MD        Allergies as of 10/07/2014 - Review Complete 10/06/2014  Allergen Reaction Noted  . Other  09/25/2014    Family History  Problem Relation Age of Onset  . Stomach cancer Neg Hx   . Colon cancer Neg Hx     History   Social History  . Marital Status: Married    Spouse Name: [name]  . Number of Children: [name]  . Years of Education: [name]   Occupational History  . Not on file.   Social History Main Topics  . Smoking status: Former Smoker    Quit date: 06/07/1961  . Smokeless tobacco: Never Used  . Alcohol Use: No  . Drug Use: No  . Sexual Activity: Not on file   Other Topics Concern  . Not on file   Social History Narrative     Physical Exam: There were no vitals taken for this visit. Constitutional: generally well-appearing Psychiatric: alert and oriented x3 Abdomen: soft, nontender, nondistended, no obvious ascites, no peritoneal signs, normal bowel sounds   Assessment and plan: 74 y.o. male with GE junction adenocarcinoma  For Staging EUS today   Owens Loffler, MD [facility] 10/15/2014,  11:23 AM

## 2014-10-15 NOTE — Discharge Instructions (Signed)

## 2014-10-16 ENCOUNTER — Encounter (HOSPITAL_COMMUNITY): Payer: Self-pay | Admitting: Gastroenterology

## 2014-10-19 ENCOUNTER — Telehealth: Payer: Self-pay | Admitting: Oncology

## 2014-10-19 ENCOUNTER — Encounter: Payer: Self-pay | Admitting: Oncology

## 2014-10-19 ENCOUNTER — Ambulatory Visit (HOSPITAL_BASED_OUTPATIENT_CLINIC_OR_DEPARTMENT_OTHER): Payer: Medicare Other | Admitting: Oncology

## 2014-10-19 ENCOUNTER — Ambulatory Visit: Payer: Medicare Other

## 2014-10-19 VITALS — BP 170/74 | HR 76 | Temp 98.4°F | Resp 20 | Ht 75.0 in | Wt 236.2 lb

## 2014-10-19 DIAGNOSIS — N400 Enlarged prostate without lower urinary tract symptoms: Secondary | ICD-10-CM

## 2014-10-19 DIAGNOSIS — C155 Malignant neoplasm of lower third of esophagus: Secondary | ICD-10-CM | POA: Insufficient documentation

## 2014-10-19 DIAGNOSIS — I10 Essential (primary) hypertension: Secondary | ICD-10-CM

## 2014-10-19 DIAGNOSIS — C159 Malignant neoplasm of esophagus, unspecified: Secondary | ICD-10-CM

## 2014-10-19 NOTE — Progress Notes (Signed)
David [name]   Referring MD: Avishai Reihl 74 y.o.  [birth year]    Reason for Referral: Esophagus cancer   HPI: David [name] reports developing solid dysphagia beginning in a proximally February of this year. Dysphagia progressed and became severe on 08/20/2014. He presented to the emergency room and was taken to an upper endoscopy by Dr. Carlean Purl. Food and fluid was noted in the mid and distal esophagus with a dilated lumen. Firm bolus impaction was noted in the distal esophagus and GE junction. This was advanced into the stomach. He was started on Nexium. He reports developing a dry mouth and constipation while on Nexium. This was discontinued and he is taking Protonix.  He was referred to Dr. Henrene Pastor for a planned esophageal dilatation procedure. He was taken to an endoscopy 09/28/2014. A 2 cm eccentric friable mass was noted at the GE junction with associated stricturing. Multiple biopsies were obtained. The stomach and duodenum appeared normal. Retroflexed views revealed no abnormality. The pathology [id number]) sees revealed invasive adenocarcinoma in a background of [name]'s esophagus. He was referred for CTs of the chest, abdomen, and pelvis on 10/06/2014. Circumferential thickening of the distal esophagus was noted immediately above the GE junction. A 9 mm paraesophageal lymph node was noted in the lower mediastinum. No other pathologically enlarged mediastinal or hilar nodes. Multiple small pulmonary nodules scattered throughout the lungs, the largest measuring 15 x 7 mm at the right pleura. No hepatic lesions. Prominent but not enlarged retroperitoneal nodes. No pathologically enlarged lymph nodes in the abdomen or pelvis. The prostate is enlarged. No ascites.  He was referred to Dr. Ardis Hughs and was taken to an endoscopic ultrasound procedure on 10/15/2014. A malignant-appearing mass was noted GE junction. This creates an incomplete GE junction  stricture. The mass was 1.4 cm in maximum thickness and passed into and through the muscular propria of the distal esophageal wall. 3 suspicious lymph nodes were noted adjacent to the mass. The tumor was staged as a uT3,uN2 lesion.  He does not have significant dysphagia at present. He chews his food thoroughly.  Past Medical History  Diagnosis Date  . Hypertension   . Anxiety   . Arthritis   . Basal cell carcinoma     nose, face, back  . Food impaction of esophagus 08/20/2014    .  Adenocarcinoma of the distal esophagus                                                        09/28/2014  Past Surgical History  Procedure Laterality Date  . Total knee arthroplasty  2010    right  . Cervical laminectomy  1980  . Appendectomy  1961  . Colonoscopy    . Esophagogastroduodenoscopy [name] 08/20/2014    Procedure: ESOPHAGOGASTRODUODENOSCOPY (EGD);  Surgeon: Gatha Mayer, MD;  Location: [facility] ENDOSCOPY;  Service: Endoscopy;  Laterality: [name];  . Eus [name] 10/15/2014    Procedure: UPPER ENDOSCOPIC ULTRASOUND (EUS) LINEAR;  Surgeon: Milus Banister, MD;  Location: [facility] ENDOSCOPY;  Service: Endoscopy;  Laterality: [name];  radial linear    Medications: Reviewed  Allergies:  Allergies  Allergen Reactions  . Nexium [Esomeprazole Magnesium] Other (See Comments)    Difficulty urinating and passing stool; dry mouth  . Other  Thinks it was oxycodone; made him hallucinate  . Statins Other (See Comments)    Leg cramping    Family history: His mother had lymphoma. His paternal grandmother had "cancer ". A paternal uncle had "cancer ". A paternal cousin is a 43 year survivor of colon cancer and esophagus cancer. A maternal uncle had "cancer.  Social History:   He lives with his wife in [place]. He is a Company secretary. He does not use cigarettes. He quit 51 years ago. No alcohol use. No transfusion history. No risk factor for HIV or hepatitis.  History  Alcohol Use No    History  Smoking status  .  Former Smoker  . Quit date: 06/07/1961  Smokeless tobacco  . Never Used      ROS:   Positives include: Intentional 30 pound weight loss with dieting, as the patient and a dry mouth when taking Nexium  A complete ROS was otherwise negative.  Physical Exam:  Blood pressure 170/74, pulse 76, temperature 98.4 F (36.9 C), temperature source Oral, resp. rate 20, height 6\' 3"  (1.905 m), weight 236 lb 3.2 oz (107.14 kg), SpO2 97 %.  HEENT: Oropharynx without visible mass, neck without mass Lungs: Lungs clear bilaterally Cardiac: Regular rate and rhythm Abdomen: No hepatosplenomegaly, nontender, no mass GU: Testes without mass  Vascular: No leg edema Lymph nodes: No cervical, supra-clavicular, axillary, or inguinal nodes Neurologic: Alert and oriented, the motor exam appears intact in the upper and lower extremities Skin: Multiple benign-appearing moles over the trunk, no rash Musculoskeletal: No spine tenderness   LAB:  CBC  Lab Results  Component Value Date   WBC 8.9 08/20/2014   HGB 16.7 08/20/2014   HCT 49.3 08/20/2014   MCV 93.4 08/20/2014   PLT 189 08/20/2014   NEUTROABS 4.6 06/08/2009     CMP      Component Value Date/Time   NA 145 08/20/2014 1600   K 3.9 08/20/2014 1600   CL 110 08/20/2014 1600   CO2 26 08/20/2014 1600   GLUCOSE 105* 08/20/2014 1600   BUN 16 10/02/2014 1426   CREATININE 0.92 10/02/2014 1426   CALCIUM 9.2 08/20/2014 1600   PROT 7.4 08/20/2014 1600   ALBUMIN 4.2 08/20/2014 1600   AST 23 08/20/2014 1600   ALT 19 08/20/2014 1600   ALKPHOS 73 08/20/2014 1600   BILITOT 0.7 08/20/2014 1600   GFRNONAA 82* 08/20/2014 1600   GFRAA >90 08/20/2014 1600     Imaging: CT images from 10/06/2014 reviewed with David [name] and his family    Assessment/Plan:   1. Adenocarcinoma of the distal esophagus,uT3uN  Presenting with a food impaction and dysphagia 08/20/2014  Staging CT scans of the chest, abdomen, and pelvis 10/06/2014 with indeterminate  pulmonary nodules, a 9 mm paraesophageal lymph node, and circumferential thickening of the distal esophagus  2.   Prostatic hypertrophy  3.   Hypertension  4.   History of multiple basal cell skin cancers   Disposition:   David [name] has been diagnosed with adenocarcinoma the esophagus. He appears to have locally advanced disease based on the staging evaluation to date. I reviewed the chest CT with David [name] and his family. It is possible the lung nodules are metastatic lesions, but these have an appearance more consistent with inflammatory lesions.  He will be referred for a staging PET scan. If there is no evidence of metastatic disease the plan is to proceed with neoadjuvant chemotherapy and radiation. I discussed the case with Dr. Tammi Klippel. He  will see David [name] this week.  I will refer David [name] to Dr. Servando [name].  I recommend Taxol/carboplatin chemotherapy and concurrent radiation. We reviewed the potential toxicities associated with the Taxol/carboplatin chemotherapy regimen including the chance for nausea/vomiting, alopecia, mucositis, and hematologic toxicity. We discussed the allergic reaction associated with Taxol and carboplatin. We discussed the bone pain and neuropathy seen with Taxol. He agrees to proceed. He will attend a chemotherapy teaching class.  David [name] will be scheduled for a first cycle of chemotherapy with the beginning of radiation on 11/02/2014.  If the staging PET scan indicates distant disease we will contact David [name] for an office visit to discuss systemic treatment options.  Approximate 50 minutes were spent with the patient today. The majority of the time was used for counseling and coordination of care.  [place], [place] 10/19/2014, 2:11 PM

## 2014-10-19 NOTE — Telephone Encounter (Signed)
No labs other thatn 7.6 ordered at this time

## 2014-10-19 NOTE — Telephone Encounter (Signed)
Gave and printed appt sched and avs for pt for July and Aug °

## 2014-10-20 ENCOUNTER — Telehealth: Payer: Self-pay | Admitting: Radiation Oncology

## 2014-10-20 ENCOUNTER — Other Ambulatory Visit: Payer: Self-pay | Admitting: *Deleted

## 2014-10-20 MED ORDER — DEXAMETHASONE 4 MG PO TABS: 10.0000 mg | ORAL_TABLET | Freq: Once | ORAL | Status: DC

## 2014-10-20 MED ORDER — PROCHLORPERAZINE MALEATE 10 MG PO TABS: 10.0000 mg | ORAL_TABLET | Freq: Four times a day (QID) | ORAL | Status: DC | PRN

## 2014-10-20 NOTE — Telephone Encounter (Signed)
Called pt to make him aware of compazine and decadron prescriptions that were called into [facility] on [address]. Explained dosages and instructions of each medication. Pt voices understanding to call us with any questions or concerns; voiced understanding of how to take medications.

## 2014-10-20 NOTE — Telephone Encounter (Signed)
Home number disconnected. Phoned patient's wife's cell. Confirmed her husband's appointments for Friday, July 1st at 1530 with nurse and 1600 with Dr. Tammi Klippel. PET scan scheduled for 1300 same day.

## 2014-10-21 ENCOUNTER — Other Ambulatory Visit: Payer: Self-pay | Admitting: *Deleted

## 2014-10-21 DIAGNOSIS — C155 Malignant neoplasm of lower third of esophagus: Secondary | ICD-10-CM

## 2014-10-23 ENCOUNTER — Ambulatory Visit (HOSPITAL_COMMUNITY)
Admission: RE | Admit: 2014-10-23 | Discharge: 2014-10-23 | Disposition: A | Discharge status: Home / Self Care | Payer: Medicare Other | Source: Ambulatory Visit | Attending: Oncology | Admitting: Oncology

## 2014-10-23 ENCOUNTER — Ambulatory Visit
Admission: RE | Admit: 2014-10-23 | Discharge: 2014-10-23 | Disposition: A | Discharge status: Home / Self Care | Payer: Medicare Other | Source: Ambulatory Visit | Attending: Radiation Oncology | Admitting: Radiation Oncology

## 2014-10-23 ENCOUNTER — Encounter: Payer: Self-pay | Admitting: Radiation Oncology

## 2014-10-23 VITALS — BP 163/77 | HR 92 | Temp 98.0°F | Resp 16 | Ht 75.0 in | Wt 235.3 lb

## 2014-10-23 DIAGNOSIS — Z510 Encounter for antineoplastic radiation therapy: Secondary | ICD-10-CM | POA: Diagnosis not present

## 2014-10-23 DIAGNOSIS — Z7982 Long term (current) use of aspirin: Secondary | ICD-10-CM | POA: Insufficient documentation

## 2014-10-23 DIAGNOSIS — N400 Enlarged prostate without lower urinary tract symptoms: Secondary | ICD-10-CM | POA: Diagnosis not present

## 2014-10-23 DIAGNOSIS — C159 Malignant neoplasm of esophagus, unspecified: Secondary | ICD-10-CM | POA: Insufficient documentation

## 2014-10-23 DIAGNOSIS — Z87891 Personal history of nicotine dependence: Secondary | ICD-10-CM | POA: Diagnosis not present

## 2014-10-23 DIAGNOSIS — M1990 Unspecified osteoarthritis, unspecified site: Secondary | ICD-10-CM | POA: Insufficient documentation

## 2014-10-23 DIAGNOSIS — F419 Anxiety disorder, unspecified: Secondary | ICD-10-CM | POA: Diagnosis not present

## 2014-10-23 DIAGNOSIS — Z79899 Other long term (current) drug therapy: Secondary | ICD-10-CM | POA: Insufficient documentation

## 2014-10-23 DIAGNOSIS — Z85828 Personal history of other malignant neoplasm of skin: Secondary | ICD-10-CM | POA: Diagnosis not present

## 2014-10-23 DIAGNOSIS — I10 Essential (primary) hypertension: Secondary | ICD-10-CM | POA: Diagnosis not present

## 2014-10-23 DIAGNOSIS — C155 Malignant neoplasm of lower third of esophagus: Secondary | ICD-10-CM

## 2014-10-23 DIAGNOSIS — R911 Solitary pulmonary nodule: Secondary | ICD-10-CM | POA: Diagnosis not present

## 2014-10-23 DIAGNOSIS — Z807 Family history of other malignant neoplasms of lymphoid, hematopoietic and related tissues: Secondary | ICD-10-CM | POA: Insufficient documentation

## 2014-10-23 LAB — GLUCOSE, CAPILLARY: GLUCOSE-CAPILLARY: 106 mg/dL — AB (ref 65–99)

## 2014-10-23 MED ORDER — FLUDEOXYGLUCOSE F - 18 (FDG) INJECTION
12.5000 | Freq: Once | INTRAVENOUS | Status: AC | PRN
  Administered 2014-10-23: 12.5 via INTRAVENOUS

## 2014-10-23 NOTE — Progress Notes (Signed)
Head and Neck Cancer Location of Tumor / Histology: invasive adenocarcinoma in a background of [name]'s esophagus.  Patient presented with severe dysphagia on 08/20/2014 that began in February 2016.  Biopsies of distal esophagus mass (if applicable) revealed:    Nutrition Status Yes No Comments  Weight changes? [x]  []  Intentional 30 pound weight loss with dieting  Swallowing concerns? []  [x]  Does not have significant dysphagia at present  PEG? []  [x]     Referrals Yes No Comments  Social Work? []  [x]    Dentistry? []  [x]    Swallowing therapy? []  [x]    Nutrition? []  [x]    Med/Onc? [x]  []  Seen by Dr. Benay Spice on 10/19/2014; scheduled to begin neoadjuvant chemotherapy and radiation on 11/02/2014   Safety Issues Yes No Comments  Prior radiation? []  [x]    Pacemaker/ICD? []  [x]    Possible current pregnancy? []  [x]    Is the patient on methotrexate? []  [x]     Tobacco/Marijuana/Snuff/ETOH use: Former smoker quit 51 years ago  Past/Anticipated interventions by otolaryngology, if any: Esophageal dilation and biopsy done by Dr. Henrene Pastor  Past/Anticipated interventions by medical oncology, if any:  He will be referred for a staging PET scan. If there is no evidence of metastatic disease the plan is to proceed with neoadjuvant chemotherapy and radiation. I discussed the case with Dr. Tammi Klippel. He will see Mr. Jayne this week.  I will refer Mr. Kissick to Dr. Servando Snare.  I recommend Taxol/carboplatin chemotherapy and concurrent radiation. We reviewed the potential toxicities associated with the Taxol/carboplatin chemotherapy regimen including the chance for nausea/vomiting, alopecia, mucositis, and hematologic toxicity. We discussed the allergic reaction associated with Taxol and carboplatin. We discussed the bone pain and neuropathy seen with Taxol. He agrees to proceed. He will attend a chemotherapy teaching class.  Mr. Kempton will be scheduled for a first cycle of chemotherapy with the beginning of radiation  on 11/02/2014.  If the staging PET scan indicates distant disease we will contact Mr. Sausedo for an office visit to discuss systemic treatment options.

## 2014-10-23 NOTE — Progress Notes (Signed)
Radiation Oncology         [phone] ________________________________  Initial outpatient Consultation  Name: David [name] MRN: [id number]  Date: 10/23/2014  DOB: 1940/10/21  QP:RFFM, Gwyndolyn Saxon, MD  Marton Redwood, MD   REFERRING PHYSICIAN: Marton Redwood, MD  DIAGNOSIS: The encounter diagnosis was Cancer of lower third of esophagus.    ICD-9-CM ICD-10-CM   1. Cancer of lower third of esophagus 150.5 C15.5     HISTORY OF PRESENT ILLNESS::David [name] is a 74 y.o. male who developed solid dysphagia beginning in approximately February of this year. Dysphagia progressed and became severe on 08/20/2014. He presented to the emergency room and was taken to an upper endoscopy by Dr. Carlean Purl.     Food and fluid was noted in the mid and distal esophagus with a dilated lumen. Firm bolus impaction was noted in the distal esophagus and GE junction. This was advanced into the stomach. He was started on Nexium. He reports developing a dry mouth and constipation while on Nexium. This was discontinued and he is taking Protonix.  He was referred to Dr. Henrene Pastor for a planned esophageal dilatation procedure. He was taken to an endoscopy 09/28/2014. A 2 cm eccentric friable mass was noted at the GE junction with associated stricturing. Multiple biopsies were obtained. The stomach and duodenum appeared normal. Retroflexed views revealed no abnormality.    The pathology [id number]) sees revealed invasive adenocarcinoma in a background of [name]'s esophagus. He was referred for CTs of the chest, abdomen, and pelvis on 10/06/2014. Circumferential thickening of the distal esophagus was noted immediately above the GE junction. A 9 mm paraesophageal lymph node was noted in the lower mediastinum. No other pathologically enlarged mediastinal or hilar nodes. Multiple small pulmonary nodules scattered throughout the lungs, the largest measuring 15 x 7 mm at the right pleura. No hepatic lesions. Prominent but not  enlarged retroperitoneal nodes. No pathologically enlarged lymph nodes in the abdomen or pelvis. The prostate is enlarged. No ascites.     He was referred to Dr. Ardis Hughs and was taken to an endoscopic ultrasound procedure on 10/15/2014. A malignant-appearing mass was noted GE junction. This creates an incomplete GE junction stricture. The mass was 1.4 cm in maximum thickness and passed into and through the muscular propria of the distal esophageal wall. 3 suspicious lymph nodes were noted adjacent to the mass. The tumor was staged as a uT3,uN2 lesion.    PET-CT was completed earlier this afternoon showing no distant metastases.  PREVIOUS RADIATION THERAPY: No  PAST MEDICAL HISTORY:  has a past medical history of Hypertension; Anxiety; Arthritis; Basal cell carcinoma; Food impaction of esophagus (08/20/2014); Esophageal cancer; and Enlarged prostate.    PAST SURGICAL HISTORY: Past Surgical History  Procedure Laterality Date  . Total knee arthroplasty  2010    right  . Cervical laminectomy  1980  . Appendectomy  1961  . Colonoscopy    . Esophagogastroduodenoscopy [name] 08/20/2014    Procedure: ESOPHAGOGASTRODUODENOSCOPY (EGD);  Surgeon: Gatha Mayer, MD;  Location: [facility] ENDOSCOPY;  Service: Endoscopy;  Laterality: [name];  . Eus [name] 10/15/2014    Procedure: UPPER ENDOSCOPIC ULTRASOUND (EUS) LINEAR;  Surgeon: Milus Banister, MD;  Location: [facility] ENDOSCOPY;  Service: Endoscopy;  Laterality: [name];  radial linear    FAMILY HISTORY: family history includes Cancer in his maternal uncle, paternal grandmother, and paternal uncle; Lymphoma in his mother. There is no history of Stomach cancer or Colon cancer.  SOCIAL HISTORY:  History   Social History  .  Marital Status: Married    Spouse Name: [name]  . Number of Children: [name]  . Years of Education: [name]   Occupational History  . Not on file.   Social History Main Topics  . Smoking status: Former Smoker    Quit date: 06/07/1961  . Smokeless  tobacco: Never Used  . Alcohol Use: No  . Drug Use: No  . Sexual Activity: Not Currently   Other Topics Concern  . Not on file   Social History Narrative    ALLERGIES: Nexium; Other; and Statins  MEDICATIONS:  Current Outpatient Prescriptions  Medication Sig Dispense Refill  . ALPRAZolam (XANAX) 0.5 MG tablet Take 0.5 mg by mouth 2 (two) times daily as needed for anxiety.   4  . amLODipine (NORVASC) 5 MG tablet Take 5 mg by mouth daily.  0  . aspirin 81 MG tablet Take 81 mg by mouth daily.    . benazepril (LOTENSIN) 40 MG tablet Take 40 mg by mouth daily.    Marland Kitchen dexamethasone (DECADRON) 4 MG tablet Take 2.5 tablets (10 mg total) by mouth once. Take 10mg  at 10pm night before 1st chemo treatment; 10mg  at 6am morning of 1st chemo treatment 5 tablet 0  . furosemide (LASIX) 40 MG tablet Take 40 mg by mouth daily.  3  . ibuprofen (ADVIL,MOTRIN) 200 MG tablet Take 200 mg by mouth every 6 (six) hours as needed for moderate pain.    . Liniments (DEEP BLUE RELIEF) GEL Apply 1 application topically daily as needed (FOR PAIN).    . Multiple Vitamin (MULTIVITAMIN) tablet Take 1 tablet by mouth daily.    Marland Kitchen OVER THE COUNTER MEDICATION Apply 1 application topically daily as needed ("ESSENTIAL OILS").    Marland Kitchen OVER THE COUNTER MEDICATION Place 1 drop into both eyes as needed (eye infection). COMPOUNDED HOME REMEDY FOR DRY EYES    . pantoprazole (PROTONIX) 40 MG tablet Take 1 tablet (40 mg total) by mouth daily. 90 tablet 3  . prochlorperazine (COMPAZINE) 10 MG tablet Take 1 tablet (10 mg total) by mouth every 6 (six) hours as needed for nausea or vomiting. 20 tablet 1  . Tamsulosin HCl (FLOMAX) 0.4 MG CAPS Take 0.4 mg by mouth daily after supper.    . Turmeric 500 MG CAPS Take 1 capsule by mouth daily.    . vitamin E 400 UNIT capsule Take 400 Units by mouth daily.     No current facility-administered medications for this encounter.    REVIEW OF SYSTEMS:  A 15 point review of systems is documented in the  electronic medical record. This was obtained by the nursing staff. However, I reviewed this with the patient to discuss relevant findings and make appropriate changes.  Pertinent items are noted in HPI.   PHYSICAL EXAM:  vitals were not taken for this visit.  Per med-onc HEENT: Oropharynx without visible mass, neck without mass Lungs: Lungs clear bilaterally Cardiac: Regular rate and rhythm Abdomen: No hepatosplenomegaly, nontender, no mass GU: Testes without mass  Vascular: No leg edema Lymph nodes: No cervical, supra-clavicular, axillary, or inguinal nodes Neurologic: Alert and oriented, the motor exam appears intact in the upper and lower extremities Skin: Multiple benign-appearing moles over the trunk, no rash Musculoskeletal: No spine tenderness  KPS = 80  100 - Normal; no complaints; no evidence of disease. 90   - Able to carry on normal activity; minor signs or symptoms of disease. 80   - Normal activity with effort; some signs or symptoms of disease.  41   - Cares for self; unable to carry on normal activity or to do active work. 60   - Requires occasional assistance, but is able to care for most of his personal needs. 50   - Requires considerable assistance and frequent medical care. 92   - Disabled; requires special care and assistance. 20   - Severely disabled; hospital admission is indicated although death not imminent. 70   - Very sick; hospital admission necessary; active supportive treatment necessary. 10   - Moribund; fatal processes progressing rapidly. 0     - Dead  Karnofsky DA, Abelmann [place], Craver LS and Burchenal [facility] [phone]) The use of the nitrogen mustards in the palliative treatment of carcinoma: with particular reference to bronchogenic carcinoma Cancer 1 [id number]  LABORATORY DATA:  Lab Results  Component Value Date   WBC 8.9 08/20/2014   HGB 16.7 08/20/2014   HCT 49.3 08/20/2014   MCV 93.4 08/20/2014   PLT 189 08/20/2014   Lab Results  Component Value Date    NA 145 08/20/2014   K 3.9 08/20/2014   CL 110 08/20/2014   CO2 26 08/20/2014   Lab Results  Component Value Date   ALT 19 08/20/2014   AST 23 08/20/2014   ALKPHOS 73 08/20/2014   BILITOT 0.7 08/20/2014     RADIOGRAPHY: Ct Chest W Contrast  10/06/2014   CLINICAL DATA:  74 year old male with difficulty swallowing for the past 2-3 months. Newly diagnosed adenocarcinoma of the esophagus. Evaluate for metastatic disease.  EXAM: CT CHEST AND ABDOMEN WITH CONTRAST  TECHNIQUE: Multidetector CT imaging of the chest and abdomen was performed following the standard protocol during bolus administration of intravenous contrast.  CONTRAST:  174mL OMNIPAQUE IOHEXOL 300 MG/ML  SOLN  COMPARISON:  No priors.  FINDINGS: CT CHEST FINDINGS  Mediastinum/Lymph Nodes: Heart size is normal. There is no significant pericardial fluid, thickening or pericardial calcification. There is atherosclerosis of the thoracic aorta, the great vessels of the mediastinum and the coronary arteries, including calcified atherosclerotic plaque in the right coronary artery. Calcifications of the mitral annulus and posterior leaflet of the mitral valve. Calcifications of the aortic valve. Circumferential thickening of the distal esophagus immediately above the gastroesophageal junction. 9 mm paraesophageal lymph node in the lower mediastinum (image 48 of series 2). No other pathologically enlarged mediastinal or hilar lymph nodes are noted. No axillary lymphadenopathy.  Lungs/Pleura: Multiple small pulmonary nodules scattered throughout the lungs bilaterally, the largest of which is a pleural-based 15 x 7 mm nodule (image 32 of series 3), which is surrounded by a halo of ground-glass attenuation which in total measures 16 x 24 mm. Mild linear scarring in the left lower lobe. No acute consolidative airspace disease. No pleural effusions.  Musculoskeletal/Soft Tissues: There are no aggressive appearing lytic or blastic lesions noted in the  visualized portions of the skeleton.  CT ABDOMEN AND PELVIS FINDINGS  Hepatobiliary: No cystic or solid hepatic lesions. No intra or extrahepatic biliary ductal dilatation. Gallbladder is normal in appearance.  Pancreas: No pancreatic mass. No pancreatic ductal dilatation. No pancreatic or peripancreatic fluid or inflammatory changes.  Spleen: Unremarkable.  Adrenals/Urinary Tract: Bilateral adrenal glands are normal in appearance. 11 mm intermediate attenuation (34 HU) lesion in the medial aspect of the interpolar region of the right kidney is incompletely characterized, but favored to represent a tiny proteinaceous cyst. Sub cm low-attenuation lesion in the upper pole the left kidney is also too small to characterize, but favored to represent a cyst.  4 mm nonobstructive calculi are present within the lower pole collecting systems of the kidneys bilaterally. No ureteral stones or hydroureteronephrosis to suggest urinary tract obstruction at this time. Urinary bladder is unremarkable.  Stomach/Bowel: The thickening at the gastroesophageal junction does not appear to extend into the proximal stomach. Stomach is normal in appearance. No pathologic dilatation of small bowel or colon. Colonic diverticulosis, particularly in the sigmoid colon, without surrounding inflammatory changes to suggest an acute diverticulitis at this time.  Vascular/Lymphatic: Atherosclerosis throughout the abdominal and pelvic vasculature, without evidence of aneurysm or dissection. Multiple prominent but nonenlarged retroperitoneal lymph nodes are noted, conspicuous by their number rather than their size (nonspecific). No pathologically enlarged lymph nodes are noted in the abdomen or pelvis.  Reproductive: Prostate gland is enlarged, with severe median lobe hypertrophy. Prostate gland is heterogeneous in appearance with multifocal coarse calcifications (nonspecific). Seminal vesicles are unremarkable.  Other: No significant volume of ascites.   No pneumoperitoneum.  Musculoskeletal: There are no aggressive appearing lytic or blastic lesions noted in the visualized portions of the skeleton.  IMPRESSION: 1. Circumferential thickening of the distal esophagus just before the gastroesophageal junction. A single borderline enlarged paraesophageal lymph node noted, which is nonspecific but concerning. In addition, there are several pulmonary nodules in the lungs bilaterally which are nonspecific. The largest of these is a mixed solid and sub solid lesion in the posterior aspect of the right upper lobe. While this may be infectious or inflammatory, the possibility of a metastatic lesion or lesions is not excluded, and close attention on followup studies is recommended. If the patient has symptoms of pulmonary infection, trial of antimicrobial therapy is recommended, followed by short-term repeat noncontrast chest CT in 2-3 weeks. If there are no such symptoms, further evaluation with PET-CT could be considered, and if this lesion in the right lower lobe is hypermetabolic, biopsy may be appropriate. 2. Tiny indeterminate renal lesions bilaterally, as above. Attention at time of follow-up imaging is recommended. 3. Atherosclerosis, including right coronary artery disease. Assessment for potential risk factor modification, dietary therapy or pharmacologic therapy may be warranted, if clinically indicated. 4. Colonic diverticulosis without findings to suggest acute diverticulitis at this time. 5. Prostatomegaly with severe median lobe hypertrophy. 6. Additional incidental findings, as above.   Electronically Signed   By: Vinnie Langton M.D.   On: 10/06/2014 15:04   Ct Abdomen Pelvis W Contrast  10/06/2014   CLINICAL DATA:  74 year old male with difficulty swallowing for the past 2-3 months. Newly diagnosed adenocarcinoma of the esophagus. Evaluate for metastatic disease.  EXAM: CT CHEST AND ABDOMEN WITH CONTRAST  TECHNIQUE: Multidetector CT imaging of the chest  and abdomen was performed following the standard protocol during bolus administration of intravenous contrast.  CONTRAST:  113mL OMNIPAQUE IOHEXOL 300 MG/ML  SOLN  COMPARISON:  No priors.  FINDINGS: CT CHEST FINDINGS  Mediastinum/Lymph Nodes: Heart size is normal. There is no significant pericardial fluid, thickening or pericardial calcification. There is atherosclerosis of the thoracic aorta, the great vessels of the mediastinum and the coronary arteries, including calcified atherosclerotic plaque in the right coronary artery. Calcifications of the mitral annulus and posterior leaflet of the mitral valve. Calcifications of the aortic valve. Circumferential thickening of the distal esophagus immediately above the gastroesophageal junction. 9 mm paraesophageal lymph node in the lower mediastinum (image 48 of series 2). No other pathologically enlarged mediastinal or hilar lymph nodes are noted. No axillary lymphadenopathy.  Lungs/Pleura: Multiple small pulmonary nodules scattered throughout the lungs bilaterally, the  largest of which is a pleural-based 15 x 7 mm nodule (image 32 of series 3), which is surrounded by a halo of ground-glass attenuation which in total measures 16 x 24 mm. Mild linear scarring in the left lower lobe. No acute consolidative airspace disease. No pleural effusions.  Musculoskeletal/Soft Tissues: There are no aggressive appearing lytic or blastic lesions noted in the visualized portions of the skeleton.  CT ABDOMEN AND PELVIS FINDINGS  Hepatobiliary: No cystic or solid hepatic lesions. No intra or extrahepatic biliary ductal dilatation. Gallbladder is normal in appearance.  Pancreas: No pancreatic mass. No pancreatic ductal dilatation. No pancreatic or peripancreatic fluid or inflammatory changes.  Spleen: Unremarkable.  Adrenals/Urinary Tract: Bilateral adrenal glands are normal in appearance. 11 mm intermediate attenuation (34 HU) lesion in the medial aspect of the interpolar region of the  right kidney is incompletely characterized, but favored to represent a tiny proteinaceous cyst. Sub cm low-attenuation lesion in the upper pole the left kidney is also too small to characterize, but favored to represent a cyst. 4 mm nonobstructive calculi are present within the lower pole collecting systems of the kidneys bilaterally. No ureteral stones or hydroureteronephrosis to suggest urinary tract obstruction at this time. Urinary bladder is unremarkable.  Stomach/Bowel: The thickening at the gastroesophageal junction does not appear to extend into the proximal stomach. Stomach is normal in appearance. No pathologic dilatation of small bowel or colon. Colonic diverticulosis, particularly in the sigmoid colon, without surrounding inflammatory changes to suggest an acute diverticulitis at this time.  Vascular/Lymphatic: Atherosclerosis throughout the abdominal and pelvic vasculature, without evidence of aneurysm or dissection. Multiple prominent but nonenlarged retroperitoneal lymph nodes are noted, conspicuous by their number rather than their size (nonspecific). No pathologically enlarged lymph nodes are noted in the abdomen or pelvis.  Reproductive: Prostate gland is enlarged, with severe median lobe hypertrophy. Prostate gland is heterogeneous in appearance with multifocal coarse calcifications (nonspecific). Seminal vesicles are unremarkable.  Other: No significant volume of ascites.  No pneumoperitoneum.  Musculoskeletal: There are no aggressive appearing lytic or blastic lesions noted in the visualized portions of the skeleton.  IMPRESSION: 1. Circumferential thickening of the distal esophagus just before the gastroesophageal junction. A single borderline enlarged paraesophageal lymph node noted, which is nonspecific but concerning. In addition, there are several pulmonary nodules in the lungs bilaterally which are nonspecific. The largest of these is a mixed solid and sub solid lesion in the posterior  aspect of the right upper lobe. While this may be infectious or inflammatory, the possibility of a metastatic lesion or lesions is not excluded, and close attention on followup studies is recommended. If the patient has symptoms of pulmonary infection, trial of antimicrobial therapy is recommended, followed by short-term repeat noncontrast chest CT in 2-3 weeks. If there are no such symptoms, further evaluation with PET-CT could be considered, and if this lesion in the right lower lobe is hypermetabolic, biopsy may be appropriate. 2. Tiny indeterminate renal lesions bilaterally, as above. Attention at time of follow-up imaging is recommended. 3. Atherosclerosis, including right coronary artery disease. Assessment for potential risk factor modification, dietary therapy or pharmacologic therapy may be warranted, if clinically indicated. 4. Colonic diverticulosis without findings to suggest acute diverticulitis at this time. 5. Prostatomegaly with severe median lobe hypertrophy. 6. Additional incidental findings, as above.   Electronically Signed   By: Vinnie Langton M.D.   On: 10/06/2014 15:04      IMPRESSION: This patient is a very nice 74 yo gentleman with clinical  [id number] adenocarcinoma of the distal esophagus.  He may benefit from neoadjuvant concurrent chemoradiotherapy.  PLAN:Today, I talked to the patient and family about the findings and work-up thus far.  We discussed the natural history of locally advanced distal esophagus adenocarcinoma and general treatment, highlighting the role of radiotherapy in the management.  We discussed the available radiation techniques, and focused on the details of logistics and delivery.  We reviewed the anticipated acute and late sequelae associated with radiation in this setting.  The patient was encouraged to ask questions that I answered to the best of my ability.  I filled out a patient counseling form during our discussion including treatment diagrams.  We  retained a copy for our records.  The patient would like to proceed with radiation and will be scheduled for CT simulation.  CT sim 7/7 to start XRT on 7/11.  I spent 60 minutes minutes face to face with the patient and more than 50% of that time was spent in counseling and/or coordination of care.    ------------------------------------------------  Sheral Apley. Tammi Klippel, M.D.

## 2014-10-23 NOTE — Progress Notes (Signed)
Reports an occasional dry cough. Reports dry mouth resolved when he stopped nexium. Reports protonix has helped relieve dysphagia. Reports chronic ringing in the ears. Denies headache, dizziness, nausea, vomiting, or diarrhea.

## 2014-10-23 NOTE — Progress Notes (Signed)
See progress note under physician encounter. 

## 2014-10-26 ENCOUNTER — Other Ambulatory Visit: Payer: Self-pay | Admitting: Oncology

## 2014-10-27 ENCOUNTER — Telehealth: Payer: Self-pay | Admitting: *Deleted

## 2014-10-27 NOTE — Telephone Encounter (Signed)
-----   Message from Ladell Pier, MD sent at 10/25/2014 12:25 PM EDT ----- Please call patient, PET with hypermetabolic esophagus mass and paraesophageal nodes, plan to proceed with chemo/xrt as planned

## 2014-10-27 NOTE — Telephone Encounter (Signed)
Called pt with PET results per Dr. Benay Spice; pt states Dr. Tammi Klippel reviewed results with him and he is aware of the findings, and agreeable to proceed with chemo/radiation tx. Has appts and reminded him of lab and chemo edu times.

## 2014-10-28 ENCOUNTER — Encounter: Payer: Self-pay | Admitting: *Deleted

## 2014-10-28 ENCOUNTER — Other Ambulatory Visit: Payer: Medicare Other

## 2014-10-28 ENCOUNTER — Institutional Professional Consult (permissible substitution) (INDEPENDENT_AMBULATORY_CARE_PROVIDER_SITE_OTHER): Payer: Medicare Other | Admitting: Cardiothoracic Surgery

## 2014-10-28 ENCOUNTER — Encounter: Payer: Self-pay | Admitting: Cardiothoracic Surgery

## 2014-10-28 ENCOUNTER — Other Ambulatory Visit (HOSPITAL_BASED_OUTPATIENT_CLINIC_OR_DEPARTMENT_OTHER): Payer: Medicare Other

## 2014-10-28 VITALS — BP 154/81 | HR 83 | Resp 16 | Ht 75.0 in | Wt 235.0 lb

## 2014-10-28 DIAGNOSIS — C160 Malignant neoplasm of cardia: Secondary | ICD-10-CM

## 2014-10-28 DIAGNOSIS — C159 Malignant neoplasm of esophagus, unspecified: Secondary | ICD-10-CM | POA: Diagnosis not present

## 2014-10-28 DIAGNOSIS — C155 Malignant neoplasm of lower third of esophagus: Secondary | ICD-10-CM | POA: Diagnosis present

## 2014-10-28 LAB — COMPREHENSIVE METABOLIC PANEL (CC13)
ALBUMIN: 3.9 g/dL (ref 3.5–5.0)
ALT: 15 U/L (ref 0–55)
ANION GAP: 9 meq/L (ref 3–11)
AST: 16 U/L (ref 5–34)
Alkaline Phosphatase: 77 U/L (ref 40–150)
BILIRUBIN TOTAL: 0.8 mg/dL (ref 0.20–1.20)
BUN: 16 mg/dL (ref 7.0–26.0)
CO2: 29 meq/L (ref 22–29)
Calcium: 9.6 mg/dL (ref 8.4–10.4)
Chloride: 105 mEq/L (ref 98–109)
Creatinine: 0.9 mg/dL (ref 0.7–1.3)
EGFR: 85 mL/min/{1.73_m2} — AB (ref >90)
GLUCOSE: 101 mg/dL (ref 70–140)
POTASSIUM: 4.4 meq/L (ref 3.5–5.1)
SODIUM: 143 meq/L (ref 136–145)
TOTAL PROTEIN: 6.8 g/dL (ref 6.4–8.3)

## 2014-10-28 LAB — CBC WITH DIFFERENTIAL/PLATELET
BASO%: 0.4 % (ref 0.0–2.0)
BASOS ABS: 0 10*3/uL (ref 0.0–0.1)
EOS%: 1.6 % (ref 0.0–7.0)
Eosinophils Absolute: 0.1 10*3/uL (ref 0.0–0.5)
HEMATOCRIT: 48.2 % (ref 38.4–49.9)
HEMOGLOBIN: 16.1 g/dL (ref 13.0–17.1)
LYMPH%: 19.5 % (ref 14.0–49.0)
MCH: 31.6 pg (ref 27.2–33.4)
MCHC: 33.5 g/dL (ref 32.0–36.0)
MCV: 94.3 fL (ref 79.3–98.0)
MONO#: 0.5 10*3/uL (ref 0.1–0.9)
MONO%: 9.8 % (ref 0.0–14.0)
NEUT%: 68.7 % (ref 39.0–75.0)
NEUTROS ABS: 3.6 10*3/uL (ref 1.5–6.5)
Platelets: 158 10*3/uL (ref 140–400)
RBC: 5.11 10*6/uL (ref 4.20–5.82)
RDW: 13.1 % (ref 11.0–14.6)
WBC: 5.3 10*3/uL (ref 4.0–10.3)
lymph#: 1 10*3/uL (ref 0.9–3.3)

## 2014-10-28 LAB — CEA: CEA: 1.5 ng/mL (ref 0.0–5.0)

## 2014-10-28 NOTE — Progress Notes (Signed)
[address]       [place] [address]             [phone]                    David [name] [facility] Record #[id number] Date of Birth: 11/06/1940  Referring: Dr Benay Spice Primary Care: Marton Redwood, MD  Chief Complaint:    Chief Complaint  Patient presents with  . Esophageal Cancer    eval with CT CHEST and PET    History of Present Illness:    David [name] 74 y.o. male is seen in the office  today for esophageal cancer. Patient notes since February 2016 some difficulty in swallowing and painful swallowing. This acutely became a problem with food obstruction requiring endoscopy. Since the first endoscopy notes that his swallowing has improved. But follow-up endoscopy confirmed a distal esophageal mass at the GE junction. Subsequent biopsy confirmed adenocarcinoma. CT scan of the chest and abdomen and PET scan has been performed and esophageal ultrasound.  Patient has been able to take a soft diet. He purposely had been trying to lose weight before the diagnosis. With a diet program he had lost from 260 down to 236.  Current Activity/ Functional Status:  Patient is independent with mobility/ambulation, transfers, ADL's, IADL's.   Zubrod Score: At the time of surgery this patient's most appropriate activity status/level should be described as: [x]     0    Normal activity, no symptoms []     1    Restricted in physical strenuous activity but ambulatory, able to do out light work []     2    Ambulatory and capable of self care, unable to do work activities, up and about               >50 % of waking hours                              []     3    Only limited self care, in bed greater than 50% of waking hours []     4    Completely disabled, no self care, confined to bed or chair []     5    Moribund   Past Medical History  Diagnosis Date  . Hypertension   . Anxiety   . Arthritis   . Basal cell carcinoma     nose, face, back  . Food impaction of  esophagus 08/20/2014  . Esophageal cancer   . Enlarged prostate     Past Surgical History  Procedure Laterality Date  . Total knee arthroplasty  2010    right  . Cervical laminectomy  1980  . Appendectomy  1961  . Colonoscopy    . Esophagogastroduodenoscopy [name] 08/20/2014    Procedure: ESOPHAGOGASTRODUODENOSCOPY (EGD);  Surgeon: Gatha Mayer, MD;  Location: [facility] ENDOSCOPY;  Service: Endoscopy;  Laterality: [name];  . Eus [name] 10/15/2014    Procedure: UPPER ENDOSCOPIC ULTRASOUND (EUS) LINEAR;  Surgeon: Milus Banister, MD;  Location: [facility] ENDOSCOPY;  Service: Endoscopy;  Laterality: [name];  radial linear    Family History  Problem Relation Age of Onset  . Stomach cancer Neg Hx   . Colon cancer Neg Hx   . Lymphoma Mother   . Cancer Maternal Uncle   . Cancer Paternal Uncle   . Cancer Paternal Grandmother    Patient had  one son who died at age 20 of adenocarcinoma the lung was a nonsmoker, presented with brain metastases and lived approximate 18 months post diagnosis. His father had bypass surgery developed a blood dyscrasia and died at age 1, his mother had lymphoma and died at age 45.   History   Social History  . Marital Status: Married    Spouse Name: [name]  . Number of Children: [name]  . Years of Education: [name]   Occupational History  .  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 Smoking status: Former Smoker    Quit date: 06/07/1961  . Smokeless tobacco: Never Used  . Alcohol Use: No  . Drug Use: No  . Sexual Activity: Not Currently            History  Smoking status  . Former Smoker  . Quit date: 06/07/1961  Smokeless tobacco  . Never Used    History  Alcohol Use No     Allergies  Allergen Reactions  . Nexium [Esomeprazole Magnesium] Other (See Comments)    Difficulty urinating and passing stool; dry mouth  . Other     Thinks it was oxycodone; made him hallucinate  . Statins Other (See Comments)    Leg cramping     Current Outpatient Prescriptions  Medication Sig Dispense Refill  . ALPRAZolam (XANAX) 0.5 MG tablet Take 0.5 mg by mouth 2 (two) times daily as needed for anxiety.   4  . amLODipine (NORVASC) 5 MG tablet Take 5 mg by mouth daily.  0  . aspirin 81 MG tablet Take 81 mg by mouth daily.    . benazepril (LOTENSIN) 40 MG tablet Take 40 mg by mouth daily.    Marland Kitchen dexamethasone (DECADRON) 4 MG tablet Take 2.5 tablets (10 mg total) by mouth once. Take 10mg  at 10pm night before 1st chemo treatment; 10mg  at 6am morning of 1st chemo treatment 5 tablet 0  . furosemide (LASIX) 40 MG tablet Take 40 mg by mouth daily.  3  . ibuprofen (ADVIL,MOTRIN) 200 MG tablet Take 200 mg by mouth every 6 (six) hours as needed for moderate pain.    . Liniments (DEEP BLUE RELIEF) GEL Apply 1 application topically daily as needed (FOR PAIN).    . Multiple Vitamin (MULTIVITAMIN) tablet Take 1 tablet by mouth daily.    Marland Kitchen OVER THE COUNTER MEDICATION Apply 1 application topically daily as needed ("ESSENTIAL OILS").    Marland Kitchen OVER THE COUNTER MEDICATION Place 1 drop into both eyes as needed (eye infection). COMPOUNDED HOME REMEDY FOR DRY EYES    . pantoprazole (PROTONIX) 40 MG tablet Take 1 tablet (40 mg total) by mouth daily. 90 tablet 3  . prochlorperazine (COMPAZINE) 10 MG tablet Take 1 tablet (10 mg total) by mouth every 6 (six) hours as needed for nausea or vomiting. 20 tablet 1  . Tamsulosin HCl (FLOMAX) 0.4 MG CAPS Take 0.4 mg by mouth daily after supper.    . Turmeric 500 MG CAPS Take 1 capsule by mouth daily.    . vitamin E 400 UNIT capsule Take 400 Units by mouth daily.     No current facility-administered medications for this visit.      Review of Systems:     Cardiac Review of Systems: Y or N  Chest Pain [   n ]  Resting SOB [  n ] Exertional SOB  [n  ]  Orthopnea [ n ]  Pedal Edema [n   ]    Palpitations [ 25 years ago ] Syncope  [ n ]   Presyncope [n   ]  General Review of Systems: [Y] = yes [   ]=no Constitional: recent weight change [  ];  Wt loss over the last 3 months [  260 to 236 had been trying to loose weight ] anorexia [  ]; fatigue [  ]; nausea [  ]; night sweats [  ]; fever [  ]; or chills [  ];          Dental: poor dentition[  ]; Last Dentist visit:   Eye : blurred vision [  ]; diplopia [   ]; vision changes [ y ];  Amaurosis fugax[  ]; Resp: cough [  ];  wheezing[  ];  hemoptysis[  ]; shortness of breath[  ]; paroxysmal nocturnal dyspnea[  ]; dyspnea on exertion[  ]; or orthopnea[  ];  GI:  gallstones[  ], vomiting[  ];  dysphagia[  ]; melena[  ];  hematochezia [  ]; heartburn[  ];   Hx of  Colonoscopy[y  ]; GU: kidney stones [ n ]; hematuria[ n ];   dysuria [  ];  nocturia[y  ];  history of     obstruction [  n]; urinary frequency Blue.Reese  ]             Skin: rash, swelling[  ];, hair loss[  ];  peripheral edema[  ];  or itching[  ]; Musculosketetal: myalgias[y  ];  joint swelling[  ];  joint erythema[  ];  joint pain[  ];  back pain[  ];  Heme/Lymph: bruising[  ];  bleeding[  ];  anemia[  ];  Neuro: TIA[n  ];  headaches[ n ];  stroke[n  ];  vertigo[  ];  seizures[n  ];   paresthesias[ n ];  difficulty walking[n  ];  Psych:depression[  ]; anxiety[  ];  Endocrine: diabetes[  ];  thyroid dysfunction[  ];  Immunizations: Flu up to date Florencio.Farrier  ]; Pneumococcal up to date [ y ];  Other:  Physical Exam: BP 154/81 mmHg  Pulse 83  Resp 16  Ht 6\' 3"  (1.905 m)  Wt 235 lb (106.595 kg)  BMI 29.37 kg/m2  SpO2 97%  PHYSICAL EXAMINATION: General appearance: alert and cooperative Head: Normocephalic, without obvious abnormality, atraumatic Neck: no adenopathy, no carotid bruit, no JVD, supple, symmetrical, trachea midline and thyroid not enlarged, symmetric, no tenderness/mass/nodules Lymph nodes: Cervical, supraclavicular, and axillary nodes normal. Resp: clear to auscultation bilaterally Back: symmetric, no curvature. ROM normal. No CVA tenderness. Cardio: regular rate and rhythm,  S1, S2 normal, no murmur, click, rub or gallop GI: soft, non-tender; bowel sounds normal; no masses,  no organomegaly Extremities: extremities normal, atraumatic, no cyanosis or edema and Homans sign is negative, no sign of DVT Neurologic: Grossly normal Patient has no carotid bruits, palpable DP and PT pulses bilaterally Diagnostic Studies & Laboratory data:     Recent Radiology Findings:   Ct Abdomen Pelvis W Contrast  10/06/2014   CLINICAL DATA:  74 year old male with difficulty swallowing for the past 2-3 months. Newly diagnosed adenocarcinoma of the esophagus. Evaluate for metastatic disease.  EXAM: CT CHEST AND ABDOMEN WITH CONTRAST  TECHNIQUE: Multidetector CT imaging of the chest and abdomen was performed following the standard protocol during bolus administration of intravenous contrast.  CONTRAST:  157mL OMNIPAQUE IOHEXOL 300 MG/ML  SOLN  COMPARISON:  No priors.  FINDINGS: CT CHEST FINDINGS  Mediastinum/Lymph Nodes: Heart size is normal. There is no significant pericardial fluid, thickening or pericardial calcification. There is atherosclerosis of the thoracic aorta, the great vessels of the mediastinum and the coronary arteries, including calcified atherosclerotic plaque in the right coronary artery. Calcifications of the mitral annulus and posterior leaflet of the mitral valve. Calcifications of the aortic valve. Circumferential thickening of the distal esophagus immediately above the gastroesophageal junction. 9 mm paraesophageal lymph node in the lower mediastinum (image 48 of series 2). No other pathologically enlarged mediastinal or hilar lymph nodes are noted. No axillary lymphadenopathy.  Lungs/Pleura: Multiple small pulmonary nodules scattered throughout the lungs bilaterally, the largest of which is a pleural-based 15 x 7 mm nodule (image 32 of series 3), which is surrounded by a halo of ground-glass attenuation which in total measures 16 x 24 mm. Mild linear scarring in the left lower  lobe. No acute consolidative airspace disease. No pleural effusions.  Musculoskeletal/Soft Tissues: There are no aggressive appearing lytic or blastic lesions noted in the visualized portions of the skeleton.  CT ABDOMEN AND PELVIS FINDINGS  Hepatobiliary: No cystic or solid hepatic lesions. No intra or extrahepatic biliary ductal dilatation. Gallbladder is normal in appearance.  Pancreas: No pancreatic mass. No pancreatic ductal dilatation. No pancreatic or peripancreatic fluid or inflammatory changes.  Spleen: Unremarkable.  Adrenals/Urinary Tract: Bilateral adrenal glands are normal in appearance. 11 mm intermediate attenuation (34 HU) lesion in the medial aspect of the interpolar region of the right kidney is incompletely characterized, but favored to represent a tiny proteinaceous cyst. Sub cm low-attenuation lesion in the upper pole the left kidney is also too small to characterize, but favored to represent a cyst. 4 mm nonobstructive calculi are present within the lower pole collecting systems of the kidneys bilaterally. No ureteral stones or hydroureteronephrosis to suggest urinary tract obstruction at this time. Urinary bladder is unremarkable.  Stomach/Bowel: The thickening at the gastroesophageal junction does not appear to extend into the proximal stomach. Stomach is normal in appearance. No pathologic dilatation of small bowel or colon. Colonic diverticulosis, particularly in the sigmoid colon, without surrounding inflammatory changes to suggest an acute diverticulitis at this time.  Vascular/Lymphatic: Atherosclerosis throughout the abdominal and pelvic vasculature, without evidence of aneurysm or dissection. Multiple prominent but nonenlarged retroperitoneal lymph nodes are noted, conspicuous by their number rather than their size (nonspecific). No pathologically enlarged lymph nodes are noted in the abdomen or pelvis.  Reproductive: Prostate gland is enlarged, with severe median lobe hypertrophy.  Prostate gland is heterogeneous in appearance with multifocal coarse calcifications (nonspecific). Seminal vesicles are unremarkable.  Other: No significant volume of ascites.  No pneumoperitoneum.  Musculoskeletal: There are no aggressive appearing lytic or blastic lesions noted in the visualized portions of the skeleton.  IMPRESSION: 1. Circumferential thickening of the distal esophagus just before the gastroesophageal junction. A single borderline enlarged paraesophageal lymph node noted, which is nonspecific but concerning. In addition, there are several pulmonary nodules in the lungs bilaterally which are nonspecific. The largest of these is a mixed solid and sub solid lesion in the posterior aspect of the right upper lobe. While this may be infectious or inflammatory, the possibility of a metastatic lesion or lesions is not excluded, and close attention on followup studies is recommended. If the patient has symptoms of pulmonary infection, trial of antimicrobial therapy is recommended, followed by short-term repeat noncontrast chest CT in 2-3 weeks. If there are no such symptoms, further evaluation with PET-CT could  be considered, and if this lesion in the right lower lobe is hypermetabolic, biopsy may be appropriate. 2. Tiny indeterminate renal lesions bilaterally, as above. Attention at time of follow-up imaging is recommended. 3. Atherosclerosis, including right coronary artery disease. Assessment for potential risk factor modification, dietary therapy or pharmacologic therapy may be warranted, if clinically indicated. 4. Colonic diverticulosis without findings to suggest acute diverticulitis at this time. 5. Prostatomegaly with severe median lobe hypertrophy. 6. Additional incidental findings, as above.   Electronically Signed   By: Vinnie Langton M.D.   On: 10/06/2014 15:04   Nm Pet Image Initial (pi) Skull Base To Thigh  10/23/2014   CLINICAL DATA:  Initial treatment strategy for esophageal  carcinoma.  EXAM: NUCLEAR MEDICINE PET SKULL BASE TO THIGH  TECHNIQUE: 12.5 mCi F-18 FDG was injected intravenously. Full-ring PET imaging was performed from the skull base to thigh after the radiotracer. CT data was obtained and used for attenuation correction and anatomic localization.  FASTING BLOOD GLUCOSE:  Value: 106 mg/dl  COMPARISON:  CT 10/06/2014  FINDINGS: NECK  No hypermetabolic lymph nodes in the neck.  CHEST  There is hypermetabolic thickening through the distal esophagus with SUV max equal 8.9. Small paraesophageal lymph nodes have faint nonspecific activity (8 mm on image 107, series and 7 mm on image 113 series 4).  There is a focus of peripheral consolidation in the posterior aspect of the right lower lobe along the plueral surface which measures 14 mm and is not changed in size from 15 mm on CT of 10/06/2014. This lesion has very low metabolic activity. There is no additional hypermetabolic pulmonary nodules.  ABDOMEN/PELVIS  No abnormal hypermetabolic activity within the liver, pancreas, adrenal glands, or spleen. No hypermetabolic lymph nodes in the abdomen or pelvis.  SKELETON  No focal hypermetabolic activity to suggest skeletal metastasis.  IMPRESSION: 1. Intense metabolic activity through the distal esophagus consistent with primary carcinoma. 2. Two small paraesophageal lymph nodes are suspicious for local nodal metastasis. These have low metabolic activity. 3. No evidence of liver metastasis. 4. Peripheral nodule within the right lower lobe is not changed in size. These lesions have very low nonspecific metabolic activity. Favor a focus infection, inflammation, or infarction.   Electronically Signed   By: Suzy Bouchard M.D.   On: 10/23/2014 14:55     I have independently reviewed the above radiologic studies.  Recent Lab Findings: Lab Results  Component Value Date   WBC 5.3 10/28/2014   HGB 16.1 10/28/2014   HCT 48.2 10/28/2014   PLT 158 10/28/2014   GLUCOSE 101 10/28/2014    ALT 15 10/28/2014   AST 16 10/28/2014   NA 143 10/28/2014   K 4.4 10/28/2014   CL 110 08/20/2014   CREATININE 0.9 10/28/2014   BUN 16.0 10/28/2014   CO2 29 10/28/2014   INR 1.36 06/16/2009   PATH:[facility] Patient: WILLEM, KLINGENSMITH A Collected: 09/28/2014 Client: [facility] Accession: [id number] Diagnosis Surgical [P], distal esophagus mass - INVASIVE ADENOCARCINOMA IN A BACKGROUND OF [name]'S ESOPHAGUS.     Endoscopic findings: 1. Clearly malignant mass at the GE juntion. The mass appears non-circumferential by endocopy, occupying about 1/2-2/3 the circumference of the distal esophagus. This is creating an incomplete GE junction stricture, lumen through the stricture is 5-29mm and so I was unable to evaluate the distal aspect of the tumor. EUS findings: 1. The mass above was actually circumferntial by EUS, maximum thickness 1.4cm. The mass clear passes into and through the muscularis propria  layer of the distal esophagus wall (uT3). 2. There were three suspicious (round, well demarcated, hypoechoic) lymphnodes adjacent to the mass 9uN2). The largest lymphnode was 1cm across. ENDOSCOPIC IMPRESSION: DJ5T0 GE junction adenocarcinoma (Stage IIIb). The mass is creating an incomplete stricture that was not able to be passed with the echoendoscope (lumen 5-63mm).   Assessment / Plan:    INVASIVE ADENOCARCINOMA OF THE DISTAL ESOPHAGUS IN A BACKGROUND OF [name]'S ESOPHAGUS,  clinical stage IIIB (uT3N2cM0)  Siewert I,  primarily involving the distal esophagus and GE junction- I have discussed with the patient his wife and daughter the dx and indication for multimodality therapy including chem, radiation and surgical resection. With resection 6-8 weeks following completion of radiation therapy and after restaging scans.      I  spent 45 minutes counseling the patient face to face and 50% or more the  time was spent in counseling and coordination of care. The total time spent in the  appointment was 60 minutes.  Grace Isaac MD      [address] [place] [address] Office [phone]   Beeper [phone]  10/28/2014 4:27 PM

## 2014-10-29 ENCOUNTER — Ambulatory Visit
Admission: RE | Admit: 2014-10-29 | Discharge: 2014-10-29 | Disposition: A | Discharge status: Home / Self Care | Payer: Medicare Other | Source: Ambulatory Visit | Attending: Radiation Oncology | Admitting: Radiation Oncology

## 2014-10-29 DIAGNOSIS — M1990 Unspecified osteoarthritis, unspecified site: Secondary | ICD-10-CM | POA: Diagnosis not present

## 2014-10-29 DIAGNOSIS — C155 Malignant neoplasm of lower third of esophagus: Secondary | ICD-10-CM

## 2014-10-29 DIAGNOSIS — Z79899 Other long term (current) drug therapy: Secondary | ICD-10-CM | POA: Diagnosis not present

## 2014-10-29 DIAGNOSIS — Z510 Encounter for antineoplastic radiation therapy: Secondary | ICD-10-CM | POA: Diagnosis not present

## 2014-10-29 DIAGNOSIS — Z7982 Long term (current) use of aspirin: Secondary | ICD-10-CM | POA: Diagnosis not present

## 2014-10-29 DIAGNOSIS — N400 Enlarged prostate without lower urinary tract symptoms: Secondary | ICD-10-CM | POA: Diagnosis not present

## 2014-10-29 NOTE — Progress Notes (Signed)
  Radiation Oncology         [phone] ________________________________  Name: ROLLIN KOTOWSKI MRN: [id number]  Date: 10/29/2014  DOB: November 23, 1940  SIMULATION AND TREATMENT PLANNING NOTE    ICD-9-CM ICD-10-CM   1. Cancer of lower third of esophagus 150.5 C15.5     DIAGNOSIS:  74 yo gentleman with clinical uT3uN2M0 adenocarcinoma of the distal esophagus  NARRATIVE:  The patient was brought to the [facility].  Identity was confirmed.  All relevant records and images related to the planned course of therapy were reviewed.  The patient freely provided informed written consent to proceed with treatment after reviewing the details related to the planned course of therapy. The consent form was witnessed and verified by the simulation staff.  Then, the patient was set-up in a stable reproducible  supine position for radiation therapy.  CT images were obtained.  Surface markings were placed.  The CT images were loaded into the planning software.  Then the target and avoidance structures were contoured.  Treatment planning then occurred.  The radiation prescription was entered and confirmed.  Then, I designed and supervised the construction of a total of zero medically necessary complex treatment devices.  I have requested : Intensity Modulated Radiotherapy (IMRT) is medically necessary for this case for the following reason:  Cardiac dose sparing while keeping spinal cord and lungs at or below tolerance.  I have ordered:Nutrition Consult  SPECIAL TREATMENT PROCEDURE:  The planned course of therapy using radiation constitutes a special treatment procedure. Special care is required in the management of this patient for the following reasons. This treatment constitutes a Special Treatment Procedure for the following reason: [ Concurrent chemotherapy requiring careful monitoring for increased toxicities of treatment including weekly laboratory values..  The special nature of the planned course of  radiotherapy will require increased physician supervision and oversight to ensure patient's safety with optimal treatment outcomes.  PLAN:  The patient will receive 50.4 Gy in 28 fraction.  This document serves as a record of services personally performed by Tyler Pita, MD. It was created on his behalf by Lenn Cal, a trained medical scribe. The creation of this record is based on the scribe's personal observations and the provider's statements to them. This document has been checked and approved by the attending provider.  ________________________________  Sheral Apley. Tammi Klippel, M.D.

## 2014-10-30 ENCOUNTER — Ambulatory Visit: Payer: Medicare Other | Admitting: Radiation Oncology

## 2014-10-30 DIAGNOSIS — Z7982 Long term (current) use of aspirin: Secondary | ICD-10-CM | POA: Diagnosis not present

## 2014-10-30 DIAGNOSIS — M1990 Unspecified osteoarthritis, unspecified site: Secondary | ICD-10-CM | POA: Diagnosis not present

## 2014-10-30 DIAGNOSIS — Z79899 Other long term (current) drug therapy: Secondary | ICD-10-CM | POA: Diagnosis not present

## 2014-10-30 DIAGNOSIS — N400 Enlarged prostate without lower urinary tract symptoms: Secondary | ICD-10-CM | POA: Diagnosis not present

## 2014-10-30 DIAGNOSIS — C155 Malignant neoplasm of lower third of esophagus: Secondary | ICD-10-CM | POA: Diagnosis not present

## 2014-10-30 DIAGNOSIS — Z510 Encounter for antineoplastic radiation therapy: Secondary | ICD-10-CM | POA: Diagnosis not present

## 2014-11-01 ENCOUNTER — Other Ambulatory Visit: Payer: Self-pay | Admitting: Oncology

## 2014-11-02 ENCOUNTER — Ambulatory Visit
Admission: RE | Admit: 2014-11-02 | Discharge: 2014-11-02 | Disposition: A | Discharge status: Home / Self Care | Payer: Medicare Other | Source: Ambulatory Visit | Attending: Radiation Oncology | Admitting: Radiation Oncology

## 2014-11-02 ENCOUNTER — Telehealth: Payer: Self-pay | Admitting: Oncology

## 2014-11-02 ENCOUNTER — Ambulatory Visit (HOSPITAL_BASED_OUTPATIENT_CLINIC_OR_DEPARTMENT_OTHER): Payer: Medicare Other

## 2014-11-02 ENCOUNTER — Ambulatory Visit (HOSPITAL_BASED_OUTPATIENT_CLINIC_OR_DEPARTMENT_OTHER): Payer: Medicare Other | Admitting: Oncology

## 2014-11-02 VITALS — HR 100 | Temp 98.5°F | Resp 20 | Ht 75.0 in | Wt 235.0 lb

## 2014-11-02 VITALS — BP 152/74 | HR 97 | Temp 98.2°F | Resp 18

## 2014-11-02 DIAGNOSIS — C155 Malignant neoplasm of lower third of esophagus: Secondary | ICD-10-CM | POA: Diagnosis not present

## 2014-11-02 DIAGNOSIS — Z7982 Long term (current) use of aspirin: Secondary | ICD-10-CM | POA: Diagnosis not present

## 2014-11-02 DIAGNOSIS — Z79899 Other long term (current) drug therapy: Secondary | ICD-10-CM | POA: Diagnosis not present

## 2014-11-02 DIAGNOSIS — Z5111 Encounter for antineoplastic chemotherapy: Secondary | ICD-10-CM | POA: Diagnosis present

## 2014-11-02 DIAGNOSIS — M1990 Unspecified osteoarthritis, unspecified site: Secondary | ICD-10-CM | POA: Diagnosis not present

## 2014-11-02 DIAGNOSIS — N400 Enlarged prostate without lower urinary tract symptoms: Secondary | ICD-10-CM | POA: Diagnosis not present

## 2014-11-02 DIAGNOSIS — Z510 Encounter for antineoplastic radiation therapy: Secondary | ICD-10-CM | POA: Diagnosis not present

## 2014-11-02 MED ORDER — FAMOTIDINE IN NACL 20-0.9 MG/50ML-% IV SOLN
20.0000 mg | Freq: Once | INTRAVENOUS | Status: AC
  Administered 2014-11-02: 20 mg via INTRAVENOUS

## 2014-11-02 MED ORDER — DIPHENHYDRAMINE HCL 50 MG/ML IJ SOLN
INTRAMUSCULAR | Status: AC
  Filled 2014-11-02: qty 1

## 2014-11-02 MED ORDER — SODIUM CHLORIDE 0.9 % IV SOLN
Freq: Once | INTRAVENOUS | Status: AC
  Administered 2014-11-02: 12:00:00 via INTRAVENOUS

## 2014-11-02 MED ORDER — SODIUM CHLORIDE 0.9 % IV SOLN
249.4000 mg | Freq: Once | INTRAVENOUS | Status: AC
  Administered 2014-11-02: 250 mg via INTRAVENOUS
  Filled 2014-11-02: qty 25

## 2014-11-02 MED ORDER — DEXAMETHASONE SODIUM PHOSPHATE 100 MG/10ML IJ SOLN
Freq: Once | INTRAMUSCULAR | Status: AC
  Administered 2014-11-02: 12:00:00 via INTRAVENOUS
  Filled 2014-11-02: qty 8

## 2014-11-02 MED ORDER — FAMOTIDINE IN NACL 20-0.9 MG/50ML-% IV SOLN
INTRAVENOUS | Status: AC
  Filled 2014-11-02: qty 50

## 2014-11-02 MED ORDER — DEXTROSE 5 % IV SOLN
50.0000 mg/m2 | Freq: Once | INTRAVENOUS | Status: AC
  Administered 2014-11-02: 120 mg via INTRAVENOUS
  Filled 2014-11-02: qty 20

## 2014-11-02 MED ORDER — DIPHENHYDRAMINE HCL 50 MG/ML IJ SOLN
50.0000 mg | Freq: Once | INTRAMUSCULAR | Status: AC
  Administered 2014-11-02: 50 mg via INTRAVENOUS

## 2014-11-02 NOTE — Patient Instructions (Addendum)
[facility] Discharge Instructions for Patients Receiving Chemotherapy  Today you received the following chemotherapy agents taxol/carboplatin   To help prevent nausea and vomiting after your treatment, we encourage you to take your nausea medication as directed   If you develop nausea and vomiting that is not controlled by your nausea medication, call the clinic.   BELOW ARE SYMPTOMS THAT SHOULD BE REPORTED IMMEDIATELY:  *FEVER GREATER THAN 100.5 F  *CHILLS WITH OR WITHOUT FEVER  NAUSEA AND VOMITING THAT IS NOT CONTROLLED WITH YOUR NAUSEA MEDICATION  *UNUSUAL SHORTNESS OF BREATH  *UNUSUAL BRUISING OR BLEEDING  TENDERNESS IN MOUTH AND THROAT WITH OR WITHOUT PRESENCE OF ULCERS  *URINARY PROBLEMS  *BOWEL PROBLEMS  UNUSUAL RASH Items with * indicate a potential emergency and should be followed up as soon as possible.  Feel free to call the clinic you have any questions or concerns. The clinic phone number is [phone].  Paclitaxel injection What is this medicine? PACLITAXEL (PAK li TAX el) is a chemotherapy drug. It targets fast dividing cells, like cancer cells, and causes these cells to die. This medicine is used to treat ovarian cancer, breast cancer, and other cancers. This medicine may be used for other purposes; ask your health care provider or pharmacist if you have questions. COMMON BRAND NAME(S): Onxol, Taxol What should I tell my health care provider before I take this medicine? They need to know if you have any of these conditions: -blood disorders -irregular heartbeat -infection (especially a virus infection such as chickenpox, cold sores, or herpes) -liver disease -previous or ongoing radiation therapy -an unusual or allergic reaction to paclitaxel, alcohol, polyoxyethylated castor oil, other chemotherapy agents, other medicines, foods, dyes, or preservatives -pregnant or trying to get pregnant -breast-feeding How should I use this  medicine? This drug is given as an infusion into a vein. It is administered in a hospital or clinic by a specially trained health care professional. Talk to your pediatrician regarding the use of this medicine in children. Special care may be needed. Overdosage: If you think you have taken too much of this medicine contact a poison control center or emergency room at once. NOTE: This medicine is only for you. Do not share this medicine with others. What if I miss a dose? It is important not to miss your dose. Call your doctor or health care professional if you are unable to keep an appointment. What may interact with this medicine? Do not take this medicine with any of the following medications: -disulfiram -metronidazole This medicine may also interact with the following medications: -cyclosporine -diazepam -ketoconazole -medicines to increase blood counts like filgrastim, pegfilgrastim, sargramostim -other chemotherapy drugs like cisplatin, doxorubicin, epirubicin, etoposide, teniposide, vincristine -quinidine -testosterone -vaccines -verapamil Talk to your doctor or health care professional before taking any of these medicines: -acetaminophen -aspirin -ibuprofen -ketoprofen -naproxen This list may not describe all possible interactions. Give your health care provider a list of all the medicines, herbs, non-prescription drugs, or dietary supplements you use. Also tell them if you smoke, drink alcohol, or use illegal drugs. Some items may interact with your medicine. What should I watch for while using this medicine? Your condition will be monitored carefully while you are receiving this medicine. You will need important blood work done while you are taking this medicine. This drug may make you feel generally unwell. This is not uncommon, as chemotherapy can affect healthy cells as well as cancer cells. Report any side effects. Continue your course of treatment  even though you feel ill  unless your doctor tells you to stop. In some cases, you may be given additional medicines to help with side effects. Follow all directions for their use. Call your doctor or health care professional for advice if you get a fever, chills or sore throat, or other symptoms of a cold or flu. Do not treat yourself. This drug decreases your body's ability to fight infections. Try to avoid being around people who are sick. This medicine may increase your risk to bruise or bleed. Call your doctor or health care professional if you notice any unusual bleeding. Be careful brushing and flossing your teeth or using a toothpick because you may get an infection or bleed more easily. If you have any dental work done, tell your dentist you are receiving this medicine. Avoid taking products that contain aspirin, acetaminophen, ibuprofen, naproxen, or ketoprofen unless instructed by your doctor. These medicines may hide a fever. Do not become pregnant while taking this medicine. Women should inform their doctor if they wish to become pregnant or think they might be pregnant. There is a potential for serious side effects to an unborn child. Talk to your health care professional or pharmacist for more information. Do not breast-feed an infant while taking this medicine. Men are advised not to father a child while receiving this medicine. What side effects may I notice from receiving this medicine? Side effects that you should report to your doctor or health care professional as soon as possible: -allergic reactions like skin rash, itching or hives, swelling of the face, lips, or tongue -low blood counts - This drug may decrease the number of white blood cells, red blood cells and platelets. You may be at increased risk for infections and bleeding. -signs of infection - fever or chills, cough, sore throat, pain or difficulty passing urine -signs of decreased platelets or bleeding - bruising, pinpoint red spots on the skin,  black, tarry stools, nosebleeds -signs of decreased red blood cells - unusually weak or tired, fainting spells, lightheadedness -breathing problems -chest pain -high or low blood pressure -mouth sores -nausea and vomiting -pain, swelling, redness or irritation at the injection site -pain, tingling, numbness in the hands or feet -slow or irregular heartbeat -swelling of the ankle, feet, hands Side effects that usually do not require medical attention (report to your doctor or health care professional if they continue or are bothersome): -bone pain -complete hair loss including hair on your head, underarms, pubic hair, eyebrows, and eyelashes -changes in the color of fingernails -diarrhea -loosening of the fingernails -loss of appetite -muscle or joint pain -red flush to skin -sweating This list may not describe all possible side effects. Call your doctor for medical advice about side effects. You may report side effects to FDA at 1-800-FDA-1088. Where should I keep my medicine? This drug is given in a hospital or clinic and will not be stored at home. NOTE: This sheet is a summary. It may not cover all possible information. If you have questions about this medicine, talk to your doctor, pharmacist, or health care provider.  2015, [employer]. (2012-06-03 16:41:21)  Carboplatin injection What is this medicine? CARBOPLATIN (KAR boe pla tin) is a chemotherapy drug. It targets fast dividing cells, like cancer cells, and causes these cells to die. This medicine is used to treat ovarian cancer and many other cancers. This medicine may be used for other purposes; ask your health care provider or pharmacist if you have questions. COMMON BRAND  NAME(S): Paraplatin What should I tell my health care provider before I take this medicine? They need to know if you have any of these conditions: -blood disorders -hearing problems -kidney disease -recent or ongoing radiation  therapy -an unusual or allergic reaction to carboplatin, cisplatin, other chemotherapy, other medicines, foods, dyes, or preservatives -pregnant or trying to get pregnant -breast-feeding How should I use this medicine? This drug is usually given as an infusion into a vein. It is administered in a hospital or clinic by a specially trained health care professional. Talk to your pediatrician regarding the use of this medicine in children. Special care may be needed. Overdosage: If you think you have taken too much of this medicine contact a poison control center or emergency room at once. NOTE: This medicine is only for you. Do not share this medicine with others. What if I miss a dose? It is important not to miss a dose. Call your doctor or health care professional if you are unable to keep an appointment. What may interact with this medicine? -medicines for seizures -medicines to increase blood counts like filgrastim, pegfilgrastim, sargramostim -some antibiotics like amikacin, gentamicin, neomycin, streptomycin, tobramycin -vaccines Talk to your doctor or health care professional before taking any of these medicines: -acetaminophen -aspirin -ibuprofen -ketoprofen -naproxen This list may not describe all possible interactions. Give your health care provider a list of all the medicines, herbs, non-prescription drugs, or dietary supplements you use. Also tell them if you smoke, drink alcohol, or use illegal drugs. Some items may interact with your medicine. What should I watch for while using this medicine? Your condition will be monitored carefully while you are receiving this medicine. You will need important blood work done while you are taking this medicine. This drug may make you feel generally unwell. This is not uncommon, as chemotherapy can affect healthy cells as well as cancer cells. Report any side effects. Continue your course of treatment even though you feel ill unless your doctor  tells you to stop. In some cases, you may be given additional medicines to help with side effects. Follow all directions for their use. Call your doctor or health care professional for advice if you get a fever, chills or sore throat, or other symptoms of a cold or flu. Do not treat yourself. This drug decreases your body's ability to fight infections. Try to avoid being around people who are sick. This medicine may increase your risk to bruise or bleed. Call your doctor or health care professional if you notice any unusual bleeding. Be careful brushing and flossing your teeth or using a toothpick because you may get an infection or bleed more easily. If you have any dental work done, tell your dentist you are receiving this medicine. Avoid taking products that contain aspirin, acetaminophen, ibuprofen, naproxen, or ketoprofen unless instructed by your doctor. These medicines may hide a fever. Do not become pregnant while taking this medicine. Women should inform their doctor if they wish to become pregnant or think they might be pregnant. There is a potential for serious side effects to an unborn child. Talk to your health care professional or pharmacist for more information. Do not breast-feed an infant while taking this medicine. What side effects may I notice from receiving this medicine? Side effects that you should report to your doctor or health care professional as soon as possible: -allergic reactions like skin rash, itching or hives, swelling of the face, lips, or tongue -signs of infection - fever or  chills, cough, sore throat, pain or difficulty passing urine -signs of decreased platelets or bleeding - bruising, pinpoint red spots on the skin, black, tarry stools, nosebleeds -signs of decreased red blood cells - unusually weak or tired, fainting spells, lightheadedness -breathing problems -changes in hearing -changes in vision -chest pain -high blood pressure -low blood counts - This  drug may decrease the number of white blood cells, red blood cells and platelets. You may be at increased risk for infections and bleeding. -nausea and vomiting -pain, swelling, redness or irritation at the injection site -pain, tingling, numbness in the hands or feet -problems with balance, talking, walking -trouble passing urine or change in the amount of urine Side effects that usually do not require medical attention (report to your doctor or health care professional if they continue or are bothersome): -hair loss -loss of appetite -metallic taste in the mouth or changes in taste This list may not describe all possible side effects. Call your doctor for medical advice about side effects. You may report side effects to FDA at 1-800-FDA-1088. Where should I keep my medicine? This drug is given in a hospital or clinic and will not be stored at home. NOTE: This sheet is a summary. It may not cover all possible information. If you have questions about this medicine, talk to your doctor, pharmacist, or health care provider.  2015, [employer]. (2007-07-16 14:38:05) Carboplatin injection What is this medicine? CARBOPLATIN (KAR boe pla tin) is a chemotherapy drug. It targets fast dividing cells, like cancer cells, and causes these cells to die. This medicine is used to treat ovarian cancer and many other cancers. This medicine may be used for other purposes; ask your health care provider or pharmacist if you have questions. COMMON BRAND NAME(S): Paraplatin What should I tell my health care provider before I take this medicine? They need to know if you have any of these conditions: -blood disorders -hearing problems -kidney disease -recent or ongoing radiation therapy -an unusual or allergic reaction to carboplatin, cisplatin, other chemotherapy, other medicines, foods, dyes, or preservatives -pregnant or trying to get pregnant -breast-feeding How should I use this medicine? This  drug is usually given as an infusion into a vein. It is administered in a hospital or clinic by a specially trained health care professional. Talk to your pediatrician regarding the use of this medicine in children. Special care may be needed. Overdosage: If you think you have taken too much of this medicine contact a poison control center or emergency room at once. NOTE: This medicine is only for you. Do not share this medicine with others. What if I miss a dose? It is important not to miss a dose. Call your doctor or health care professional if you are unable to keep an appointment. What may interact with this medicine? -medicines for seizures -medicines to increase blood counts like filgrastim, pegfilgrastim, sargramostim -some antibiotics like amikacin, gentamicin, neomycin, streptomycin, tobramycin -vaccines Talk to your doctor or health care professional before taking any of these medicines: -acetaminophen -aspirin -ibuprofen -ketoprofen -naproxen This list may not describe all possible interactions. Give your health care provider a list of all the medicines, herbs, non-prescription drugs, or dietary supplements you use. Also tell them if you smoke, drink alcohol, or use illegal drugs. Some items may interact with your medicine. What should I watch for while using this medicine? Your condition will be monitored carefully while you are receiving this medicine. You will need important blood work done while you are  taking this medicine. This drug may make you feel generally unwell. This is not uncommon, as chemotherapy can affect healthy cells as well as cancer cells. Report any side effects. Continue your course of treatment even though you feel ill unless your doctor tells you to stop. In some cases, you may be given additional medicines to help with side effects. Follow all directions for their use. Call your doctor or health care professional for advice if you get a fever, chills or sore  throat, or other symptoms of a cold or flu. Do not treat yourself. This drug decreases your body's ability to fight infections. Try to avoid being around people who are sick. This medicine may increase your risk to bruise or bleed. Call your doctor or health care professional if you notice any unusual bleeding. Be careful brushing and flossing your teeth or using a toothpick because you may get an infection or bleed more easily. If you have any dental work done, tell your dentist you are receiving this medicine. Avoid taking products that contain aspirin, acetaminophen, ibuprofen, naproxen, or ketoprofen unless instructed by your doctor. These medicines may hide a fever. Do not become pregnant while taking this medicine. Women should inform their doctor if they wish to become pregnant or think they might be pregnant. There is a potential for serious side effects to an unborn child. Talk to your health care professional or pharmacist for more information. Do not breast-feed an infant while taking this medicine. What side effects may I notice from receiving this medicine? Side effects that you should report to your doctor or health care professional as soon as possible: -allergic reactions like skin rash, itching or hives, swelling of the face, lips, or tongue -signs of infection - fever or chills, cough, sore throat, pain or difficulty passing urine -signs of decreased platelets or bleeding - bruising, pinpoint red spots on the skin, black, tarry stools, nosebleeds -signs of decreased red blood cells - unusually weak or tired, fainting spells, lightheadedness -breathing problems -changes in hearing -changes in vision -chest pain -high blood pressure -low blood counts - This drug may decrease the number of white blood cells, red blood cells and platelets. You may be at increased risk for infections and bleeding. -nausea and vomiting -pain, swelling, redness or irritation at the injection site -pain,  tingling, numbness in the hands or feet -problems with balance, talking, walking -trouble passing urine or change in the amount of urine Side effects that usually do not require medical attention (report to your doctor or health care professional if they continue or are bothersome): -hair loss -loss of appetite -metallic taste in the mouth or changes in taste This list may not describe all possible side effects. Call your doctor for medical advice about side effects. You may report side effects to FDA at 1-800-FDA-1088. Where should I keep my medicine? This drug is given in a hospital or clinic and will not be stored at home. NOTE: This sheet is a summary. It may not cover all possible information. If you have questions about this medicine, talk to your doctor, pharmacist, or health care provider.  2015, [employer]. (2007-07-16 14:38:05) Paclitaxel injection What is this medicine? PACLITAXEL (PAK li TAX el) is a chemotherapy drug. It targets fast dividing cells, like cancer cells, and causes these cells to die. This medicine is used to treat ovarian cancer, breast cancer, and other cancers. This medicine may be used for other purposes; ask your health care provider or pharmacist if  you have questions. COMMON BRAND NAME(S): Onxol, Taxol What should I tell my health care provider before I take this medicine? They need to know if you have any of these conditions: -blood disorders -irregular heartbeat -infection (especially a virus infection such as chickenpox, cold sores, or herpes) -liver disease -previous or ongoing radiation therapy -an unusual or allergic reaction to paclitaxel, alcohol, polyoxyethylated castor oil, other chemotherapy agents, other medicines, foods, dyes, or preservatives -pregnant or trying to get pregnant -breast-feeding How should I use this medicine? This drug is given as an infusion into a vein. It is administered in a hospital or clinic by a specially  trained health care professional. Talk to your pediatrician regarding the use of this medicine in children. Special care may be needed. Overdosage: If you think you have taken too much of this medicine contact a poison control center or emergency room at once. NOTE: This medicine is only for you. Do not share this medicine with others. What if I miss a dose? It is important not to miss your dose. Call your doctor or health care professional if you are unable to keep an appointment. What may interact with this medicine? Do not take this medicine with any of the following medications: -disulfiram -metronidazole This medicine may also interact with the following medications: -cyclosporine -diazepam -ketoconazole -medicines to increase blood counts like filgrastim, pegfilgrastim, sargramostim -other chemotherapy drugs like cisplatin, doxorubicin, epirubicin, etoposide, teniposide, vincristine -quinidine -testosterone -vaccines -verapamil Talk to your doctor or health care professional before taking any of these medicines: -acetaminophen -aspirin -ibuprofen -ketoprofen -naproxen This list may not describe all possible interactions. Give your health care provider a list of all the medicines, herbs, non-prescription drugs, or dietary supplements you use. Also tell them if you smoke, drink alcohol, or use illegal drugs. Some items may interact with your medicine. What should I watch for while using this medicine? Your condition will be monitored carefully while you are receiving this medicine. You will need important blood work done while you are taking this medicine. This drug may make you feel generally unwell. This is not uncommon, as chemotherapy can affect healthy cells as well as cancer cells. Report any side effects. Continue your course of treatment even though you feel ill unless your doctor tells you to stop. In some cases, you may be given additional medicines to help with side  effects. Follow all directions for their use. Call your doctor or health care professional for advice if you get a fever, chills or sore throat, or other symptoms of a cold or flu. Do not treat yourself. This drug decreases your body's ability to fight infections. Try to avoid being around people who are sick. This medicine may increase your risk to bruise or bleed. Call your doctor or health care professional if you notice any unusual bleeding. Be careful brushing and flossing your teeth or using a toothpick because you may get an infection or bleed more easily. If you have any dental work done, tell your dentist you are receiving this medicine. Avoid taking products that contain aspirin, acetaminophen, ibuprofen, naproxen, or ketoprofen unless instructed by your doctor. These medicines may hide a fever. Do not become pregnant while taking this medicine. Women should inform their doctor if they wish to become pregnant or think they might be pregnant. There is a potential for serious side effects to an unborn child. Talk to your health care professional or pharmacist for more information. Do not breast-feed an infant while taking this medicine.  Men are advised not to father a child while receiving this medicine. What side effects may I notice from receiving this medicine? Side effects that you should report to your doctor or health care professional as soon as possible: -allergic reactions like skin rash, itching or hives, swelling of the face, lips, or tongue -low blood counts - This drug may decrease the number of white blood cells, red blood cells and platelets. You may be at increased risk for infections and bleeding. -signs of infection - fever or chills, cough, sore throat, pain or difficulty passing urine -signs of decreased platelets or bleeding - bruising, pinpoint red spots on the skin, black, tarry stools, nosebleeds -signs of decreased red blood cells - unusually weak or tired, fainting  spells, lightheadedness -breathing problems -chest pain -high or low blood pressure -mouth sores -nausea and vomiting -pain, swelling, redness or irritation at the injection site -pain, tingling, numbness in the hands or feet -slow or irregular heartbeat -swelling of the ankle, feet, hands Side effects that usually do not require medical attention (report to your doctor or health care professional if they continue or are bothersome): -bone pain -complete hair loss including hair on your head, underarms, pubic hair, eyebrows, and eyelashes -changes in the color of fingernails -diarrhea -loosening of the fingernails -loss of appetite -muscle or joint pain -red flush to skin -sweating This list may not describe all possible side effects. Call your doctor for medical advice about side effects. You may report side effects to FDA at 1-800-FDA-1088. Where should I keep my medicine? This drug is given in a hospital or clinic and will not be stored at home. NOTE: This sheet is a summary. It may not cover all possible information. If you have questions about this medicine, talk to your doctor, pharmacist, or health care provider.  2015, Elsevier/Gold Standard. (2012-06-03 16:41:21)   i enjoyed taking care of you today. A nurse will call you tomorrow, to make sure you are doing ok. Should you have any problems before then, do not hesitate to call us. There is a doctor on call 24 hours a day. [phone]. i hope you do well and we will see you soon. Dixie smith rn ocn

## 2014-11-02 NOTE — Telephone Encounter (Signed)
Pt confirmed labs/ov per 07/11 POF, gave pt AVS and Calendar.... KJ °

## 2014-11-02 NOTE — Progress Notes (Signed)
  [facility] OFFICE PROGRESS NOTE   Diagnosis: Esophagus cancer  INTERVAL HISTORY:   Mr. Cavins returns as scheduled. He saw Dr. Tammi Klippel and started radiation today. He is scheduled to begin Taxol/carboplatin today. A PET scan 10/23/2014 showed no evidence of distant metastatic disease. Dr. Servando Snare plans restaging scans and consideration of surgery at the completion of chemotherapy/radiation.  Mr. Beckner denies dysphagia.  Objective:  Vital signs in last 24 hours:  Pulse 100, temperature 98.5 F (36.9 C), temperature source Oral, resp. rate 20, height 6\' 3"  (1.905 m), weight 235 lb (106.595 kg), SpO2 96 %.    Resp: Lungs clear bilaterally Cardio: Regular rate and rhythm GI: No hepatomegaly Vascular: No leg edema   Lab Results:  Lab Results  Component Value Date   WBC 5.3 10/28/2014   HGB 16.1 10/28/2014   HCT 48.2 10/28/2014   MCV 94.3 10/28/2014   PLT 158 10/28/2014   NEUTROABS 3.6 10/28/2014    Lab Results  Component Value Date   CEA 1.5 10/28/2014   Medications: I have reviewed the patient's current medications.  Assessment/Plan: 1. Adenocarcinoma of the distal esophagus,uT3uN  Presenting with a food impaction and dysphagia 08/20/2014  Staging CT scans of the chest, abdomen, and pelvis 10/06/2014 with indeterminate pulmonary nodules, a 9 mm paraesophageal lymph node, and circumferential thickening of the distal esophagus  PET scan 10/23/2014 confirmed hypermetabolic thickening at the distal esophagus with small paraesophageal nodes having faint nonspecific activity. A pleural-based focus of consolidation at the posterior right lower lobe had low metabolic activity-favored to be a benign process.  Initiation of concurrent weekly Taxol/carboplatin and radiation on 11/02/2014  2. Prostatic hypertrophy  3. Hypertension  4. History of multiple basal cell skin cancers    Disposition:  Mr. Nettleton appears stable. The plan is to begin concurrent  chemotherapy and radiation today. He will return for an office visit and chemotherapy in 2 weeks.  Betsy Coder, MD  11/02/2014  6:11 PM

## 2014-11-02 NOTE — [facility] Oncology Navigator Note (Signed)
Oncology Nurse Navigator Documentation  Oncology Nurse Navigator Flowsheets 11/02/2014  Navigator Encounter Type Treatment--1st chemo  Barriers/Navigation Needs No barriers at this time  Met with patient during initial chemotherapy treatment to provide support and assess for needs to promote continuity of care. Has premeds and has good understanding of his treatment plan.  Susan Coward, RN, BSN GI Oncology Navigator [facility]  

## 2014-11-03 ENCOUNTER — Ambulatory Visit
Admission: RE | Admit: 2014-11-03 | Discharge: 2014-11-03 | Disposition: A | Discharge status: Home / Self Care | Payer: Medicare Other | Source: Ambulatory Visit | Attending: Radiation Oncology | Admitting: Radiation Oncology

## 2014-11-03 ENCOUNTER — Telehealth: Payer: Self-pay

## 2014-11-03 DIAGNOSIS — C155 Malignant neoplasm of lower third of esophagus: Secondary | ICD-10-CM | POA: Diagnosis not present

## 2014-11-03 DIAGNOSIS — N400 Enlarged prostate without lower urinary tract symptoms: Secondary | ICD-10-CM | POA: Diagnosis not present

## 2014-11-03 DIAGNOSIS — M1990 Unspecified osteoarthritis, unspecified site: Secondary | ICD-10-CM | POA: Diagnosis not present

## 2014-11-03 DIAGNOSIS — Z510 Encounter for antineoplastic radiation therapy: Secondary | ICD-10-CM | POA: Diagnosis not present

## 2014-11-03 DIAGNOSIS — Z7982 Long term (current) use of aspirin: Secondary | ICD-10-CM | POA: Diagnosis not present

## 2014-11-03 DIAGNOSIS — Z79899 Other long term (current) drug therapy: Secondary | ICD-10-CM | POA: Diagnosis not present

## 2014-11-03 NOTE — Telephone Encounter (Addendum)
Message from pt stating he has noticed no side effects from chemo. Denies fever or nausea. Left message on voicemail reviewing Compazine instructions, encouraged pt to call office for symptom management as needed.

## 2014-11-03 NOTE — Telephone Encounter (Signed)
lvm chemo f/u call if any concerns or questions.

## 2014-11-03 NOTE — Telephone Encounter (Signed)
-----   Message from Randolm Idol, [place] sent at 11/02/2014  3:03 PM EDT ----- 1st carbo/taxol

## 2014-11-04 ENCOUNTER — Ambulatory Visit
Admission: RE | Admit: 2014-11-04 | Discharge: 2014-11-04 | Disposition: A | Discharge status: Home / Self Care | Payer: Medicare Other | Source: Ambulatory Visit | Attending: Radiation Oncology | Admitting: Radiation Oncology

## 2014-11-04 DIAGNOSIS — M1990 Unspecified osteoarthritis, unspecified site: Secondary | ICD-10-CM | POA: Diagnosis not present

## 2014-11-04 DIAGNOSIS — C155 Malignant neoplasm of lower third of esophagus: Secondary | ICD-10-CM | POA: Diagnosis not present

## 2014-11-04 DIAGNOSIS — Z510 Encounter for antineoplastic radiation therapy: Secondary | ICD-10-CM | POA: Diagnosis not present

## 2014-11-04 DIAGNOSIS — Z7982 Long term (current) use of aspirin: Secondary | ICD-10-CM | POA: Diagnosis not present

## 2014-11-04 DIAGNOSIS — N400 Enlarged prostate without lower urinary tract symptoms: Secondary | ICD-10-CM | POA: Diagnosis not present

## 2014-11-04 DIAGNOSIS — Z79899 Other long term (current) drug therapy: Secondary | ICD-10-CM | POA: Diagnosis not present

## 2014-11-05 ENCOUNTER — Ambulatory Visit
Admission: RE | Admit: 2014-11-05 | Discharge: 2014-11-05 | Disposition: A | Discharge status: Home / Self Care | Payer: Medicare Other | Source: Ambulatory Visit | Attending: Radiation Oncology | Admitting: Radiation Oncology

## 2014-11-05 ENCOUNTER — Telehealth: Payer: Self-pay | Admitting: *Deleted

## 2014-11-05 DIAGNOSIS — M1990 Unspecified osteoarthritis, unspecified site: Secondary | ICD-10-CM | POA: Diagnosis not present

## 2014-11-05 DIAGNOSIS — Z7982 Long term (current) use of aspirin: Secondary | ICD-10-CM | POA: Diagnosis not present

## 2014-11-05 DIAGNOSIS — C155 Malignant neoplasm of lower third of esophagus: Secondary | ICD-10-CM | POA: Diagnosis not present

## 2014-11-05 DIAGNOSIS — N400 Enlarged prostate without lower urinary tract symptoms: Secondary | ICD-10-CM | POA: Diagnosis not present

## 2014-11-05 DIAGNOSIS — Z79899 Other long term (current) drug therapy: Secondary | ICD-10-CM | POA: Diagnosis not present

## 2014-11-05 DIAGNOSIS — Z510 Encounter for antineoplastic radiation therapy: Secondary | ICD-10-CM | POA: Diagnosis not present

## 2014-11-05 NOTE — Telephone Encounter (Signed)
Called to report he had a few brief episodes of irregular heart rate yesterday (was at rest).Asymptomatic during the episode. Has not occurred today. Does report this has happened in the past about a month ago. Still able to engage in normal activity-walked a mile yesterday. Had some discomfort behind his right knee yesterday, but is better today. Resumed his daily ASA. No swelling or redness and is better today. Made him aware that occasional episode of irregular heart rate is not uncommon and usually benign. Also he is at higher risk for DVT, but does not sound as if he has a DVT. Instructed him to call if irregular heart rate becomes more frequent or he has pain or dyspnea during the episode. Call for swelling or redness in leg. Will forward call to MD to determine if he suggests any further action.

## 2014-11-06 ENCOUNTER — Telehealth: Payer: Self-pay | Admitting: Nurse Practitioner

## 2014-11-06 ENCOUNTER — Ambulatory Visit
Admission: RE | Admit: 2014-11-06 | Discharge: 2014-11-06 | Disposition: A | Discharge status: Home / Self Care | Payer: Medicare Other | Source: Ambulatory Visit | Attending: Radiation Oncology | Admitting: Radiation Oncology

## 2014-11-06 ENCOUNTER — Encounter: Payer: Self-pay | Admitting: Radiation Oncology

## 2014-11-06 ENCOUNTER — Other Ambulatory Visit: Payer: Self-pay | Admitting: *Deleted

## 2014-11-06 VITALS — BP 127/73 | HR 88 | Temp 98.4°F | Resp 20 | Ht 76.0 in | Wt 235.3 lb

## 2014-11-06 DIAGNOSIS — C155 Malignant neoplasm of lower third of esophagus: Secondary | ICD-10-CM | POA: Diagnosis not present

## 2014-11-06 DIAGNOSIS — Z510 Encounter for antineoplastic radiation therapy: Secondary | ICD-10-CM | POA: Diagnosis not present

## 2014-11-06 DIAGNOSIS — Z7982 Long term (current) use of aspirin: Secondary | ICD-10-CM | POA: Diagnosis not present

## 2014-11-06 DIAGNOSIS — N400 Enlarged prostate without lower urinary tract symptoms: Secondary | ICD-10-CM | POA: Diagnosis not present

## 2014-11-06 DIAGNOSIS — Z79899 Other long term (current) drug therapy: Secondary | ICD-10-CM | POA: Diagnosis not present

## 2014-11-06 DIAGNOSIS — M1990 Unspecified osteoarthritis, unspecified site: Secondary | ICD-10-CM | POA: Diagnosis not present

## 2014-11-06 HISTORY — DX: Allergy, unspecified, initial encounter: T78.40XA

## 2014-11-06 MED ORDER — BIAFINE EX EMUL
Freq: Every day | CUTANEOUS | Status: DC
  Administered 2014-11-06: 14:00:00 via TOPICAL

## 2014-11-06 MED ORDER — EMOLLIENT BASE EX CREA: TOPICAL_CREAM | CUTANEOUS | Status: DC | PRN

## 2014-11-06 NOTE — Addendum Note (Signed)
Encounter addended by: Doreen Beam, RN on: 11/06/2014  1:33 PM<BR>     Documentation filed: Dx [employer], Inpatient MAR, Orders

## 2014-11-06 NOTE — Addendum Note (Signed)
Encounter addended by: Doreen Beam, RN on: 11/06/2014  1:08 PM<BR>     Documentation filed: Medications, Orders

## 2014-11-06 NOTE — Progress Notes (Signed)
  Radiation Oncology         [phone]   Name: David [name] MRN: [id number]   Date: 11/06/2014  DOB: 1940/08/07   Weekly Radiation Therapy Management    ICD-9-CM ICD-10-CM   1. Cancer of lower third of esophagus 150.5 C15.5     Current Dose: 9 Gy  Planned Dose:  50.4 Gy  Narrative The patient presents for routine under treatment assessment. Weekly rad txs 5 completed esophagus, patient education done, radiation therapy and you book given ,[employer] business card, along with biafine,  Cream,, to use daily  After radiation treatments  Once skin becomes irritated or itching  discussed ways to manage side effects/symptoms, pain, throat changes, difficulty swallowing, fatigue, increase protein in diet, drink plenty water,fluids,stay hydrated, may need to go to softer foods,  5-6 smaller meals instead of 3 large ones, and snacks, will see MD weekly and prn, verbal understandfing,teach back method, no pain at present, had nausea yestarday took compazine and resolved.  The patient is without complaint. Set-up films were reviewed. The chart was checked.  Physical Findings  height is 6\' 4"  (1.93 m) and weight is 235 lb 4.8 oz (106.731 kg). His oral temperature is 98.4 F (36.9 C). His blood pressure is 127/73 and his pulse is 88. His respiration is 20. . Weight essentially stable.  No significant changes.  Impression The patient is tolerating radiation.  Plan Continue treatment as planned.         Sheral Apley Tammi Klippel, M.D.

## 2014-11-06 NOTE — Progress Notes (Signed)
Weekly rad txs 5 completed esophagus, patient education done, radiation therapy and you book given ,[employer] business card, along with biafine,  Cream,, to use daily  After radiation treatments  Once skin becomes irritated or itching  discussed ways to manage side effects/symptoms, pain, throat changes, difficulty swallowing, fatigue, increase protein in diet, drink plenty water,fluids,stay hydrated, may need to go to softer foods,  5-6 smaller meals instead of 3 large ones, and snacks, will see MD weekly and prn, verbal understandfing,teach back method, no pain at present, had nausea yestarday took compazine and resolved BP 127/73 mmHg  Pulse 88  Temp(Src) 98.4 F (36.9 C) (Oral)  Resp 20  Ht 6\' 4"  (1.93 m)  Wt 235 lb 4.8 oz (106.731 kg)  BMI 28.65 kg/m2  Wt Readings from Last 3 Encounters:  11/06/14 235 lb 4.8 oz (106.731 kg)  11/02/14 235 lb (106.595 kg)  10/28/14 235 lb (106.595 kg)

## 2014-11-06 NOTE — Telephone Encounter (Signed)
S/w pt confirming labs added for 07/18 per 07/15 POF.... Cherylann Banas

## 2014-11-07 ENCOUNTER — Other Ambulatory Visit: Payer: Self-pay | Admitting: Oncology

## 2014-11-09 ENCOUNTER — Ambulatory Visit (HOSPITAL_BASED_OUTPATIENT_CLINIC_OR_DEPARTMENT_OTHER): Payer: Medicare Other

## 2014-11-09 ENCOUNTER — Telehealth: Payer: Self-pay | Admitting: *Deleted

## 2014-11-09 ENCOUNTER — Other Ambulatory Visit (HOSPITAL_BASED_OUTPATIENT_CLINIC_OR_DEPARTMENT_OTHER): Payer: Medicare Other

## 2014-11-09 ENCOUNTER — Ambulatory Visit
Admission: RE | Admit: 2014-11-09 | Discharge: 2014-11-09 | Disposition: A | Discharge status: Home / Self Care | Payer: Medicare Other | Source: Ambulatory Visit | Attending: Radiation Oncology | Admitting: Radiation Oncology

## 2014-11-09 VITALS — BP 155/79 | HR 79 | Temp 98.0°F | Resp 20

## 2014-11-09 DIAGNOSIS — N400 Enlarged prostate without lower urinary tract symptoms: Secondary | ICD-10-CM | POA: Diagnosis not present

## 2014-11-09 DIAGNOSIS — C155 Malignant neoplasm of lower third of esophagus: Secondary | ICD-10-CM

## 2014-11-09 DIAGNOSIS — Z5111 Encounter for antineoplastic chemotherapy: Secondary | ICD-10-CM | POA: Diagnosis present

## 2014-11-09 DIAGNOSIS — Z7982 Long term (current) use of aspirin: Secondary | ICD-10-CM | POA: Diagnosis not present

## 2014-11-09 DIAGNOSIS — M1990 Unspecified osteoarthritis, unspecified site: Secondary | ICD-10-CM | POA: Diagnosis not present

## 2014-11-09 DIAGNOSIS — Z510 Encounter for antineoplastic radiation therapy: Secondary | ICD-10-CM | POA: Diagnosis not present

## 2014-11-09 DIAGNOSIS — Z79899 Other long term (current) drug therapy: Secondary | ICD-10-CM | POA: Diagnosis not present

## 2014-11-09 LAB — CBC WITH DIFFERENTIAL/PLATELET
BASO%: 0.3 % (ref 0.0–2.0)
Basophils Absolute: 0 10*3/uL (ref 0.0–0.1)
EOS ABS: 0.1 10*3/uL (ref 0.0–0.5)
EOS%: 3.2 % (ref 0.0–7.0)
HCT: 44.4 % (ref 38.4–49.9)
HGB: 15 g/dL (ref 13.0–17.1)
LYMPH%: 16.3 % (ref 14.0–49.0)
MCH: 31.8 pg (ref 27.2–33.4)
MCHC: 33.9 g/dL (ref 32.0–36.0)
MCV: 93.8 fL (ref 79.3–98.0)
MONO#: 0.2 10*3/uL (ref 0.1–0.9)
MONO%: 5.9 % (ref 0.0–14.0)
NEUT#: 2.7 10*3/uL (ref 1.5–6.5)
NEUT%: 74.3 % (ref 39.0–75.0)
PLATELETS: 149 10*3/uL (ref 140–400)
RBC: 4.73 10*6/uL (ref 4.20–5.82)
RDW: 12.9 % (ref 11.0–14.6)
WBC: 3.7 10*3/uL — ABNORMAL LOW (ref 4.0–10.3)
lymph#: 0.6 10*3/uL — ABNORMAL LOW (ref 0.9–3.3)

## 2014-11-09 MED ORDER — SODIUM CHLORIDE 0.9 % IV SOLN
Freq: Once | INTRAVENOUS | Status: AC
  Administered 2014-11-09: 10:00:00 via INTRAVENOUS
  Filled 2014-11-09: qty 8

## 2014-11-09 MED ORDER — DIPHENHYDRAMINE HCL 50 MG/ML IJ SOLN
INTRAMUSCULAR | Status: AC
  Filled 2014-11-09: qty 1

## 2014-11-09 MED ORDER — FAMOTIDINE IN NACL 20-0.9 MG/50ML-% IV SOLN
20.0000 mg | Freq: Once | INTRAVENOUS | Status: AC
Start: 2014-11-09 — End: 2014-11-09
  Administered 2014-11-09: 20 mg via INTRAVENOUS

## 2014-11-09 MED ORDER — SODIUM CHLORIDE 0.9 % IV SOLN
Freq: Once | INTRAVENOUS | Status: AC
  Administered 2014-11-09: 10:00:00 via INTRAVENOUS

## 2014-11-09 MED ORDER — FAMOTIDINE IN NACL 20-0.9 MG/50ML-% IV SOLN
INTRAVENOUS | Status: AC
  Filled 2014-11-09: qty 50

## 2014-11-09 MED ORDER — DIPHENHYDRAMINE HCL 50 MG/ML IJ SOLN
25.0000 mg | Freq: Once | INTRAMUSCULAR | Status: AC
  Administered 2014-11-09: 10:00:00 via INTRAVENOUS

## 2014-11-09 MED ORDER — SODIUM CHLORIDE 0.9 % IV SOLN
249.4000 mg | Freq: Once | INTRAVENOUS | Status: AC
  Administered 2014-11-09: 250 mg via INTRAVENOUS
  Filled 2014-11-09: qty 25

## 2014-11-09 MED ORDER — PACLITAXEL CHEMO INJECTION 300 MG/50ML
50.0000 mg/m2 | Freq: Once | INTRAVENOUS | Status: AC
  Administered 2014-11-09: 120 mg via INTRAVENOUS
  Filled 2014-11-09: qty 20

## 2014-11-09 NOTE — Progress Notes (Signed)
OK to treat with CMET from 12 days ago per Dr. Benay Spice.

## 2014-11-09 NOTE — Patient Instructions (Signed)
[facility] Discharge Instructions for Patients Receiving Chemotherapy  Today you received the following chemotherapy agents Taxol and Carboplatin. To help prevent nausea and vomiting after your treatment, we encourage you to take your nausea medication as directed.  If you develop nausea and vomiting that is not controlled by your nausea medication, call the clinic.   BELOW ARE SYMPTOMS THAT SHOULD BE REPORTED IMMEDIATELY:  *FEVER GREATER THAN 100.5 F  *CHILLS WITH OR WITHOUT FEVER  NAUSEA AND VOMITING THAT IS NOT CONTROLLED WITH YOUR NAUSEA MEDICATION  *UNUSUAL SHORTNESS OF BREATH  *UNUSUAL BRUISING OR BLEEDING  TENDERNESS IN MOUTH AND THROAT WITH OR WITHOUT PRESENCE OF ULCERS  *URINARY PROBLEMS  *BOWEL PROBLEMS  UNUSUAL RASH Items with * indicate a potential emergency and should be followed up as soon as possible.  Feel free to call the clinic you have any questions or concerns. The clinic phone number is [phone].  Please show the CHEMO ALERT CARD at check-in to the Emergency Department and triage nurse.    

## 2014-11-09 NOTE — Telephone Encounter (Signed)
Per desk RN I have moved appts on 7/25 to follow radiation. I have called and left the patient a message

## 2014-11-10 ENCOUNTER — Ambulatory Visit
Admission: RE | Admit: 2014-11-10 | Discharge: 2014-11-10 | Disposition: A | Discharge status: Home / Self Care | Payer: Medicare Other | Source: Ambulatory Visit | Attending: Radiation Oncology | Admitting: Radiation Oncology

## 2014-11-10 DIAGNOSIS — Z510 Encounter for antineoplastic radiation therapy: Secondary | ICD-10-CM | POA: Diagnosis not present

## 2014-11-10 DIAGNOSIS — C155 Malignant neoplasm of lower third of esophagus: Secondary | ICD-10-CM | POA: Diagnosis not present

## 2014-11-10 DIAGNOSIS — M1990 Unspecified osteoarthritis, unspecified site: Secondary | ICD-10-CM | POA: Diagnosis not present

## 2014-11-10 DIAGNOSIS — N400 Enlarged prostate without lower urinary tract symptoms: Secondary | ICD-10-CM | POA: Diagnosis not present

## 2014-11-10 DIAGNOSIS — Z79899 Other long term (current) drug therapy: Secondary | ICD-10-CM | POA: Diagnosis not present

## 2014-11-10 DIAGNOSIS — Z7982 Long term (current) use of aspirin: Secondary | ICD-10-CM | POA: Diagnosis not present

## 2014-11-11 ENCOUNTER — Ambulatory Visit
Admission: RE | Admit: 2014-11-11 | Discharge: 2014-11-11 | Disposition: A | Discharge status: Home / Self Care | Payer: Medicare Other | Source: Ambulatory Visit | Attending: Radiation Oncology | Admitting: Radiation Oncology

## 2014-11-11 DIAGNOSIS — Z79899 Other long term (current) drug therapy: Secondary | ICD-10-CM | POA: Diagnosis not present

## 2014-11-11 DIAGNOSIS — Z510 Encounter for antineoplastic radiation therapy: Secondary | ICD-10-CM | POA: Diagnosis not present

## 2014-11-11 DIAGNOSIS — N400 Enlarged prostate without lower urinary tract symptoms: Secondary | ICD-10-CM | POA: Diagnosis not present

## 2014-11-11 DIAGNOSIS — M1990 Unspecified osteoarthritis, unspecified site: Secondary | ICD-10-CM | POA: Diagnosis not present

## 2014-11-11 DIAGNOSIS — C155 Malignant neoplasm of lower third of esophagus: Secondary | ICD-10-CM | POA: Diagnosis not present

## 2014-11-11 DIAGNOSIS — Z7982 Long term (current) use of aspirin: Secondary | ICD-10-CM | POA: Diagnosis not present

## 2014-11-12 ENCOUNTER — Ambulatory Visit
Admission: RE | Admit: 2014-11-12 | Discharge: 2014-11-12 | Disposition: A | Discharge status: Home / Self Care | Payer: Medicare Other | Source: Ambulatory Visit | Attending: Radiation Oncology | Admitting: Radiation Oncology

## 2014-11-12 DIAGNOSIS — M1990 Unspecified osteoarthritis, unspecified site: Secondary | ICD-10-CM | POA: Diagnosis not present

## 2014-11-12 DIAGNOSIS — C155 Malignant neoplasm of lower third of esophagus: Secondary | ICD-10-CM | POA: Diagnosis not present

## 2014-11-12 DIAGNOSIS — Z510 Encounter for antineoplastic radiation therapy: Secondary | ICD-10-CM | POA: Diagnosis not present

## 2014-11-12 DIAGNOSIS — N400 Enlarged prostate without lower urinary tract symptoms: Secondary | ICD-10-CM | POA: Diagnosis not present

## 2014-11-12 DIAGNOSIS — Z79899 Other long term (current) drug therapy: Secondary | ICD-10-CM | POA: Diagnosis not present

## 2014-11-12 DIAGNOSIS — Z7982 Long term (current) use of aspirin: Secondary | ICD-10-CM | POA: Diagnosis not present

## 2014-11-13 ENCOUNTER — Ambulatory Visit
Admission: RE | Admit: 2014-11-13 | Discharge: 2014-11-13 | Disposition: A | Discharge status: Home / Self Care | Payer: Medicare Other | Source: Ambulatory Visit | Attending: Radiation Oncology | Admitting: Radiation Oncology

## 2014-11-13 VITALS — BP 135/68 | HR 89 | Resp 16 | Wt 234.2 lb

## 2014-11-13 DIAGNOSIS — Z510 Encounter for antineoplastic radiation therapy: Secondary | ICD-10-CM | POA: Diagnosis not present

## 2014-11-13 DIAGNOSIS — C155 Malignant neoplasm of lower third of esophagus: Secondary | ICD-10-CM | POA: Diagnosis not present

## 2014-11-13 DIAGNOSIS — Z79899 Other long term (current) drug therapy: Secondary | ICD-10-CM | POA: Diagnosis not present

## 2014-11-13 DIAGNOSIS — N400 Enlarged prostate without lower urinary tract symptoms: Secondary | ICD-10-CM | POA: Diagnosis not present

## 2014-11-13 DIAGNOSIS — Z7982 Long term (current) use of aspirin: Secondary | ICD-10-CM | POA: Diagnosis not present

## 2014-11-13 DIAGNOSIS — M1990 Unspecified osteoarthritis, unspecified site: Secondary | ICD-10-CM | POA: Diagnosis not present

## 2014-11-13 NOTE — Progress Notes (Addendum)
Weight and vitals stable. Denies painful or difficult swallowing. Reports if he rushes while eating he experiences reflux and hiccups. No skin changes noted within treatment field. Reports occasional hoarseness. Reports difficulty remaining asleep. Reports nocturia x 3. Reports he has been taking alprazolam 0.5 mg before bed but, it no longer sustains him throughout the night.Denies fatigue.  BP 135/68 mmHg  Pulse 89  Resp 16  Wt 234 lb 3.2 oz (106.232 kg) Wt Readings from Last 3 Encounters:  11/13/14 234 lb 3.2 oz (106.232 kg)  11/06/14 235 lb 4.8 oz (106.731 kg)  11/02/14 235 lb (106.595 kg)

## 2014-11-13 NOTE — Progress Notes (Signed)
  Radiation Oncology         [phone]   Name: BARRET ESQUIVEL MRN: [id number]   Date: 11/13/2014  DOB: 20-Dec-1940     Weekly Radiation Therapy Management    ICD-9-CM ICD-10-CM   1. Cancer of lower third of esophagus 150.5 C15.5     Current Dose: 18 Gy  Planned Dose:  50.4 Gy  Narrative The patient presents for routine under treatment assessment. Weight and vitals stable. Denies painful or difficult swallowing. Reports if he rushes while eating he experiences reflux and hiccups. No skin changes noted within treatment field. Reports occasional hoarseness. Reports difficulty remaining asleep. Reports nocturia x 3. Reports he has been taking alprazolam 0.5 mg before bed but, it no longer sustains him throughout the night. Reports nocturia x5 Wednesday night, attributing this to high fluid intake that day. Reports walking 1-1.5 miles daily. Denies fatigue.  The patient is without complaint. Set-up films were reviewed. The chart was checked.  Physical Findings  weight is 234 lb 3.2 oz (106.232 kg). His blood pressure is 135/68 and his pulse is 89. His respiration is 16. . Weight essentially stable.  No significant changes.  Impression The patient is tolerating radiation.  Plan Continue treatment as planned. Suggested trying an additional half dose of 0.25mg  alprazolam during the night to help him sleep. Discussed potential switch to ativan when he is low on alprazolam if the increased dose does not help.   This document serves as a record of services personally performed by Tyler Pita, MD. It was created on his behalf by Arlyce Harman, a trained medical scribe. The creation of this record is based on the scribe's personal observations and the provider's statements to them. This document has been checked and approved by the attending provider.      Sheral Apley Tammi Klippel, M.D.

## 2014-11-14 ENCOUNTER — Other Ambulatory Visit: Payer: Self-pay | Admitting: Oncology

## 2014-11-16 ENCOUNTER — Other Ambulatory Visit (HOSPITAL_BASED_OUTPATIENT_CLINIC_OR_DEPARTMENT_OTHER): Payer: Medicare Other

## 2014-11-16 ENCOUNTER — Ambulatory Visit: Payer: Medicare Other

## 2014-11-16 ENCOUNTER — Ambulatory Visit
Admission: RE | Admit: 2014-11-16 | Discharge: 2014-11-16 | Disposition: A | Discharge status: Home / Self Care | Payer: Medicare Other | Source: Ambulatory Visit | Attending: Radiation Oncology | Admitting: Radiation Oncology

## 2014-11-16 ENCOUNTER — Ambulatory Visit (HOSPITAL_BASED_OUTPATIENT_CLINIC_OR_DEPARTMENT_OTHER): Payer: Medicare Other | Admitting: Nurse Practitioner

## 2014-11-16 ENCOUNTER — Other Ambulatory Visit: Payer: Medicare Other

## 2014-11-16 ENCOUNTER — Other Ambulatory Visit: Payer: Self-pay | Admitting: *Deleted

## 2014-11-16 ENCOUNTER — Telehealth: Payer: Self-pay | Admitting: Oncology

## 2014-11-16 ENCOUNTER — Ambulatory Visit (HOSPITAL_BASED_OUTPATIENT_CLINIC_OR_DEPARTMENT_OTHER): Payer: Medicare Other

## 2014-11-16 VITALS — BP 140/59 | HR 85 | Temp 98.0°F | Resp 18 | Ht 76.0 in | Wt 236.0 lb

## 2014-11-16 DIAGNOSIS — N400 Enlarged prostate without lower urinary tract symptoms: Secondary | ICD-10-CM | POA: Diagnosis not present

## 2014-11-16 DIAGNOSIS — C155 Malignant neoplasm of lower third of esophagus: Secondary | ICD-10-CM

## 2014-11-16 DIAGNOSIS — Z85828 Personal history of other malignant neoplasm of skin: Secondary | ICD-10-CM

## 2014-11-16 DIAGNOSIS — Z7982 Long term (current) use of aspirin: Secondary | ICD-10-CM | POA: Diagnosis not present

## 2014-11-16 DIAGNOSIS — Z79899 Other long term (current) drug therapy: Secondary | ICD-10-CM | POA: Diagnosis not present

## 2014-11-16 DIAGNOSIS — I10 Essential (primary) hypertension: Secondary | ICD-10-CM | POA: Diagnosis not present

## 2014-11-16 DIAGNOSIS — Z5111 Encounter for antineoplastic chemotherapy: Secondary | ICD-10-CM

## 2014-11-16 DIAGNOSIS — Z510 Encounter for antineoplastic radiation therapy: Secondary | ICD-10-CM | POA: Diagnosis not present

## 2014-11-16 DIAGNOSIS — M1990 Unspecified osteoarthritis, unspecified site: Secondary | ICD-10-CM | POA: Diagnosis not present

## 2014-11-16 LAB — CBC WITH DIFFERENTIAL/PLATELET
BASO%: 0.9 % (ref 0.0–2.0)
Basophils Absolute: 0 10*3/uL (ref 0.0–0.1)
EOS%: 2.6 % (ref 0.0–7.0)
Eosinophils Absolute: 0.1 10*3/uL (ref 0.0–0.5)
HCT: 44.1 % (ref 38.4–49.9)
HEMOGLOBIN: 15.1 g/dL (ref 13.0–17.1)
LYMPH%: 16 % (ref 14.0–49.0)
MCH: 32.2 pg (ref 27.2–33.4)
MCHC: 34.2 g/dL (ref 32.0–36.0)
MCV: 94 fL (ref 79.3–98.0)
MONO#: 0.4 10*3/uL (ref 0.1–0.9)
MONO%: 15.2 % — AB (ref 0.0–14.0)
NEUT#: 1.5 10*3/uL (ref 1.5–6.5)
NEUT%: 65.3 % (ref 39.0–75.0)
PLATELETS: 133 10*3/uL — AB (ref 140–400)
RBC: 4.69 10*6/uL (ref 4.20–5.82)
RDW: 12.9 % (ref 11.0–14.6)
WBC: 2.3 10*3/uL — ABNORMAL LOW (ref 4.0–10.3)
lymph#: 0.4 10*3/uL — ABNORMAL LOW (ref 0.9–3.3)

## 2014-11-16 LAB — COMPREHENSIVE METABOLIC PANEL (CC13)
ALK PHOS: 71 U/L (ref 40–150)
ALT: 13 U/L (ref 0–55)
ANION GAP: 8 meq/L (ref 3–11)
AST: 14 U/L (ref 5–34)
Albumin: 3.5 g/dL (ref 3.5–5.0)
BILIRUBIN TOTAL: 0.69 mg/dL (ref 0.20–1.20)
BUN: 17.4 mg/dL (ref 7.0–26.0)
CALCIUM: 9.1 mg/dL (ref 8.4–10.4)
CO2: 26 meq/L (ref 22–29)
CREATININE: 0.8 mg/dL (ref 0.7–1.3)
Chloride: 111 mEq/L — ABNORMAL HIGH (ref 98–109)
EGFR: 89 mL/min/{1.73_m2} — ABNORMAL LOW (ref >90)
Glucose: 106 mg/dl (ref 70–140)
Potassium: 4.1 mEq/L (ref 3.5–5.1)
SODIUM: 145 meq/L (ref 136–145)
Total Protein: 6.3 g/dL — ABNORMAL LOW (ref 6.4–8.3)

## 2014-11-16 MED ORDER — FAMOTIDINE IN NACL 20-0.9 MG/50ML-% IV SOLN
INTRAVENOUS | Status: AC
  Filled 2014-11-16: qty 50

## 2014-11-16 MED ORDER — PACLITAXEL CHEMO INJECTION 300 MG/50ML
50.0000 mg/m2 | Freq: Once | INTRAVENOUS | Status: AC
  Administered 2014-11-16: 120 mg via INTRAVENOUS
  Filled 2014-11-16: qty 20

## 2014-11-16 MED ORDER — DEXAMETHASONE SODIUM PHOSPHATE 100 MG/10ML IJ SOLN
Freq: Once | INTRAMUSCULAR | Status: AC
  Administered 2014-11-16: 15:00:00 via INTRAVENOUS
  Filled 2014-11-16: qty 8

## 2014-11-16 MED ORDER — DIPHENHYDRAMINE HCL 50 MG/ML IJ SOLN
INTRAMUSCULAR | Status: AC
  Filled 2014-11-16: qty 1

## 2014-11-16 MED ORDER — SODIUM CHLORIDE 0.9 % IV SOLN
249.4000 mg | Freq: Once | INTRAVENOUS | Status: AC
  Administered 2014-11-16: 250 mg via INTRAVENOUS
  Filled 2014-11-16: qty 25

## 2014-11-16 MED ORDER — DIPHENHYDRAMINE HCL 50 MG/ML IJ SOLN
25.0000 mg | Freq: Once | INTRAMUSCULAR | Status: AC
  Administered 2014-11-16: 25 mg via INTRAVENOUS

## 2014-11-16 MED ORDER — SODIUM CHLORIDE 0.9 % IV SOLN
Freq: Once | INTRAVENOUS | Status: AC
  Administered 2014-11-16: 15:00:00 via INTRAVENOUS

## 2014-11-16 MED ORDER — FAMOTIDINE IN NACL 20-0.9 MG/50ML-% IV SOLN
20.0000 mg | Freq: Once | INTRAVENOUS | Status: AC
  Administered 2014-11-16: 20 mg via INTRAVENOUS

## 2014-11-16 NOTE — Progress Notes (Signed)
  [facility] OFFICE PROGRESS NOTE   Diagnosis:  Esophagus cancer  INTERVAL HISTORY:   David [name] returns as scheduled. He continues radiation and concurrent weekly Taxol/carboplatin chemotherapy. He completed week 2 of the chemotherapy 11/09/2014. He denies nausea/vomiting. No mouth sores. No significant diarrhea. No constipation. No dysphagia unless he does not chew food well. He had some "reflux" this morning. No mouth sores. No numbness or tingling in his hands or feet. No rash.  Objective:  Vital signs in last 24 hours:  Blood pressure 140/59, pulse 85, temperature 98 F (36.7 C), temperature source Oral, resp. rate 18, height 6\' 4"  (1.93 m), weight 236 lb (107.049 kg), SpO2 97 %.    HEENT: No thrush or ulcers. Lymphatics: No palpable cervical or supra clavicular lymph nodes. Resp: Lungs clear bilaterally. Cardio: Regular rate and rhythm. GI: Abdomen soft and nontender. No hepatomegaly. Vascular: No leg edema. Neuro: Vibratory sense intact over the fingertips per tuning fork exam.    Lab Results:  Lab Results  Component Value Date   WBC 2.3* 11/16/2014   HGB 15.1 11/16/2014   HCT 44.1 11/16/2014   MCV 94.0 11/16/2014   PLT 133* 11/16/2014   NEUTROABS 1.5 11/16/2014    Imaging:  No results found.  Medications: I have reviewed the patient's current medications.  Assessment/Plan: 1. Adenocarcinoma of the distal esophagus,uT3uN  Presenting with a food impaction and dysphagia 08/20/2014  Staging CT scans of the chest, abdomen, and pelvis 10/06/2014 with indeterminate pulmonary nodules, a 9 mm paraesophageal lymph node, and circumferential thickening of the distal esophagus  PET scan 10/23/2014 confirmed hypermetabolic thickening at the distal esophagus with small paraesophageal nodes having faint nonspecific activity. A pleural-based focus of consolidation at the posterior right lower lobe had low metabolic activity-favored to be a benign  process.  Initiation of concurrent weekly Taxol/carboplatin and radiation on 11/02/2014  2. Prostatic hypertrophy  3. Hypertension  4. History of multiple basal cell skin cancers   Disposition: David [name] appears stable. He continues radiation and concurrent weekly Taxol/carboplatin chemotherapy. We will see him in follow-up on 11/26/2014. He will contact the office in the interim with any problems.  Plan reviewed with Dr. Benay Spice.    Ned Card ANP/GNP-BC   11/16/2014  1:55 PM

## 2014-11-16 NOTE — Telephone Encounter (Signed)
per pof to sch pt appt-gave pt copy of avs °

## 2014-11-16 NOTE — Patient Instructions (Signed)
[facility] Discharge Instructions for Patients Receiving Chemotherapy  Today you received the following chemotherapy agents Taxol/Carboplatin To help prevent nausea and vomiting after your treatment, we encourage you to take your nausea medication as prescribed.   If you develop nausea and vomiting that is not controlled by your nausea medication, call the clinic.   BELOW ARE SYMPTOMS THAT SHOULD BE REPORTED IMMEDIATELY:  *FEVER GREATER THAN 100.5 F  *CHILLS WITH OR WITHOUT FEVER  NAUSEA AND VOMITING THAT IS NOT CONTROLLED WITH YOUR NAUSEA MEDICATION  *UNUSUAL SHORTNESS OF BREATH  *UNUSUAL BRUISING OR BLEEDING  TENDERNESS IN MOUTH AND THROAT WITH OR WITHOUT PRESENCE OF ULCERS  *URINARY PROBLEMS  *BOWEL PROBLEMS  UNUSUAL RASH Items with * indicate a potential emergency and should be followed up as soon as possible.  Feel free to call the clinic you have any questions or concerns. The clinic phone number is [phone].  Please show the CHEMO ALERT CARD at check-in to the Emergency Department and triage nurse.   

## 2014-11-17 ENCOUNTER — Ambulatory Visit
Admission: RE | Admit: 2014-11-17 | Discharge: 2014-11-17 | Disposition: A | Discharge status: Home / Self Care | Payer: Medicare Other | Source: Ambulatory Visit | Attending: Radiation Oncology | Admitting: Radiation Oncology

## 2014-11-17 DIAGNOSIS — M1990 Unspecified osteoarthritis, unspecified site: Secondary | ICD-10-CM | POA: Diagnosis not present

## 2014-11-17 DIAGNOSIS — C155 Malignant neoplasm of lower third of esophagus: Secondary | ICD-10-CM | POA: Diagnosis not present

## 2014-11-17 DIAGNOSIS — Z510 Encounter for antineoplastic radiation therapy: Secondary | ICD-10-CM | POA: Diagnosis not present

## 2014-11-17 DIAGNOSIS — Z79899 Other long term (current) drug therapy: Secondary | ICD-10-CM | POA: Diagnosis not present

## 2014-11-17 DIAGNOSIS — N400 Enlarged prostate without lower urinary tract symptoms: Secondary | ICD-10-CM | POA: Diagnosis not present

## 2014-11-17 DIAGNOSIS — Z7982 Long term (current) use of aspirin: Secondary | ICD-10-CM | POA: Diagnosis not present

## 2014-11-18 ENCOUNTER — Ambulatory Visit
Admission: RE | Admit: 2014-11-18 | Discharge: 2014-11-18 | Disposition: A | Discharge status: Home / Self Care | Payer: Medicare Other | Source: Ambulatory Visit | Attending: Radiation Oncology | Admitting: Radiation Oncology

## 2014-11-18 DIAGNOSIS — M1990 Unspecified osteoarthritis, unspecified site: Secondary | ICD-10-CM | POA: Diagnosis not present

## 2014-11-18 DIAGNOSIS — Z510 Encounter for antineoplastic radiation therapy: Secondary | ICD-10-CM | POA: Diagnosis not present

## 2014-11-18 DIAGNOSIS — C155 Malignant neoplasm of lower third of esophagus: Secondary | ICD-10-CM | POA: Diagnosis not present

## 2014-11-18 DIAGNOSIS — Z79899 Other long term (current) drug therapy: Secondary | ICD-10-CM | POA: Diagnosis not present

## 2014-11-18 DIAGNOSIS — Z7982 Long term (current) use of aspirin: Secondary | ICD-10-CM | POA: Diagnosis not present

## 2014-11-18 DIAGNOSIS — N400 Enlarged prostate without lower urinary tract symptoms: Secondary | ICD-10-CM | POA: Diagnosis not present

## 2014-11-19 ENCOUNTER — Encounter: Payer: Self-pay | Admitting: Radiation Oncology

## 2014-11-19 ENCOUNTER — Ambulatory Visit
Admission: RE | Admit: 2014-11-19 | Discharge: 2014-11-19 | Disposition: A | Discharge status: Home / Self Care | Payer: Medicare Other | Source: Ambulatory Visit | Attending: Radiation Oncology | Admitting: Radiation Oncology

## 2014-11-19 VITALS — BP 114/61 | HR 96 | Temp 98.2°F | Resp 20 | Wt 239.2 lb

## 2014-11-19 DIAGNOSIS — C155 Malignant neoplasm of lower third of esophagus: Secondary | ICD-10-CM

## 2014-11-19 DIAGNOSIS — M1990 Unspecified osteoarthritis, unspecified site: Secondary | ICD-10-CM | POA: Diagnosis not present

## 2014-11-19 DIAGNOSIS — Z7982 Long term (current) use of aspirin: Secondary | ICD-10-CM | POA: Diagnosis not present

## 2014-11-19 DIAGNOSIS — Z510 Encounter for antineoplastic radiation therapy: Secondary | ICD-10-CM | POA: Diagnosis not present

## 2014-11-19 DIAGNOSIS — Z79899 Other long term (current) drug therapy: Secondary | ICD-10-CM | POA: Diagnosis not present

## 2014-11-19 DIAGNOSIS — N400 Enlarged prostate without lower urinary tract symptoms: Secondary | ICD-10-CM | POA: Diagnosis not present

## 2014-11-19 NOTE — Progress Notes (Signed)
Weekly rad txs esophagus  14/28 completed , no difficulty  Swallowing or throat pain,  Cuts up and chews food  And takes his time, no nausea  Walks 1-1.5 mile daily ,no fatigue drinks plenty water  .jNo pain BP 114/61 mmHg  Pulse 96  Temp(Src) 98.2 F (36.8 C) (Oral)  Resp 20  Wt 239 lb 3.2 oz (108.5 kg)  Wt Readings from Last 3 Encounters:  11/19/14 239 lb 3.2 oz (108.5 kg)  11/16/14 236 lb (107.049 kg)  11/13/14 234 lb 3.2 oz (106.232 kg)

## 2014-11-19 NOTE — Progress Notes (Signed)
  Radiation Oncology         [phone]   Name: David [name] MRN: [id number]   Date: 11/19/2014  DOB: 20-Nov-1940     Weekly Radiation Therapy Management  Diagnosis: David [name] is a 74 year old gentleman presenting to clinic in regards to his cancer of lower third of the esophagus.  Current Dose: 32.4 Gy  Planned Dose:  50.4 Gy  Narrative The patient presents for routine under treatment assessment and has completed 14 of 28 treatments. He reports walking about 1 to 1.5 miles daily with no fatigue. He cuts up food, eats very slowly, and drinks plenty of water. He denies pain, pain while swallowing, nausea, or difficulty swallowing. The set-up films were reviewed and the chart was checked. The patient was accompanied by his wife for today's visit.  Physical Findings  weight is 239 lb 3.2 oz (108.5 kg). His oral temperature is 98.2 F (36.8 C). His blood pressure is 114/61 and his pulse is 96. His respiration is 20.  His weight is essentially stable with no significant changes in overall health to be noted at this time.  Impression David [name] is a 74 year old gentleman presenting to clinic in regards to his cancer of lower third of the esophagus. The patient is tolerating radiation well.  Plan All questions vocalized by the patient were fully addressed. Common symptoms to expect at this time in the patient's recovery were discussed and reviewed. Healthy methods to manage these symptoms if they are to occur were addressed. He is advised of his follow up appointment to take place with radiation oncology. If he develops any further questions or concerns in regards to his treatment and recovery, he has been encouraged to contact Dr. Tammi Klippel, MD.   This document serves as a record of services personally performed by Tyler Pita, MD. It was created on his behalf by Lenn Cal, a trained medical scribe. The creation of this record is based on the scribe's personal observations and the provider's  statements to them. This document has been checked and approved by the attending provider.  _________________________________________   Sheral Apley. Tammi Klippel, M.D.

## 2014-11-20 ENCOUNTER — Ambulatory Visit
Admission: RE | Admit: 2014-11-20 | Discharge: 2014-11-20 | Disposition: A | Discharge status: Home / Self Care | Payer: Medicare Other | Source: Ambulatory Visit | Attending: Radiation Oncology | Admitting: Radiation Oncology

## 2014-11-20 DIAGNOSIS — C155 Malignant neoplasm of lower third of esophagus: Secondary | ICD-10-CM | POA: Diagnosis not present

## 2014-11-20 DIAGNOSIS — Z79899 Other long term (current) drug therapy: Secondary | ICD-10-CM | POA: Diagnosis not present

## 2014-11-20 DIAGNOSIS — Z7982 Long term (current) use of aspirin: Secondary | ICD-10-CM | POA: Diagnosis not present

## 2014-11-20 DIAGNOSIS — N400 Enlarged prostate without lower urinary tract symptoms: Secondary | ICD-10-CM | POA: Diagnosis not present

## 2014-11-20 DIAGNOSIS — Z510 Encounter for antineoplastic radiation therapy: Secondary | ICD-10-CM | POA: Diagnosis not present

## 2014-11-20 DIAGNOSIS — M1990 Unspecified osteoarthritis, unspecified site: Secondary | ICD-10-CM | POA: Diagnosis not present

## 2014-11-22 ENCOUNTER — Other Ambulatory Visit: Payer: Self-pay | Admitting: Oncology

## 2014-11-23 ENCOUNTER — Ambulatory Visit (HOSPITAL_BASED_OUTPATIENT_CLINIC_OR_DEPARTMENT_OTHER): Payer: Medicare Other

## 2014-11-23 ENCOUNTER — Ambulatory Visit
Admission: RE | Admit: 2014-11-23 | Discharge: 2014-11-23 | Disposition: A | Discharge status: Home / Self Care | Payer: Medicare Other | Source: Ambulatory Visit | Attending: Radiation Oncology | Admitting: Radiation Oncology

## 2014-11-23 VITALS — BP 129/58 | HR 89 | Temp 98.1°F | Resp 20

## 2014-11-23 DIAGNOSIS — M1990 Unspecified osteoarthritis, unspecified site: Secondary | ICD-10-CM | POA: Diagnosis not present

## 2014-11-23 DIAGNOSIS — C155 Malignant neoplasm of lower third of esophagus: Secondary | ICD-10-CM

## 2014-11-23 DIAGNOSIS — Z5111 Encounter for antineoplastic chemotherapy: Secondary | ICD-10-CM | POA: Diagnosis present

## 2014-11-23 DIAGNOSIS — Z7982 Long term (current) use of aspirin: Secondary | ICD-10-CM | POA: Diagnosis not present

## 2014-11-23 DIAGNOSIS — Z510 Encounter for antineoplastic radiation therapy: Secondary | ICD-10-CM | POA: Diagnosis not present

## 2014-11-23 DIAGNOSIS — N400 Enlarged prostate without lower urinary tract symptoms: Secondary | ICD-10-CM | POA: Diagnosis not present

## 2014-11-23 DIAGNOSIS — Z79899 Other long term (current) drug therapy: Secondary | ICD-10-CM | POA: Diagnosis not present

## 2014-11-23 LAB — CBC WITH DIFFERENTIAL/PLATELET
BASO%: 0.8 % (ref 0.0–2.0)
Basophils Absolute: 0 10*3/uL (ref 0.0–0.1)
EOS ABS: 0 10*3/uL (ref 0.0–0.5)
EOS%: 1.2 % (ref 0.0–7.0)
HEMATOCRIT: 42.7 % (ref 38.4–49.9)
HEMOGLOBIN: 14.7 g/dL (ref 13.0–17.1)
LYMPH#: 0.3 10*3/uL — AB (ref 0.9–3.3)
LYMPH%: 12.2 % — ABNORMAL LOW (ref 14.0–49.0)
MCH: 32.2 pg (ref 27.2–33.4)
MCHC: 34.4 g/dL (ref 32.0–36.0)
MCV: 93.6 fL (ref 79.3–98.0)
MONO#: 0.3 10*3/uL (ref 0.1–0.9)
MONO%: 10.2 % (ref 0.0–14.0)
NEUT#: 1.9 10*3/uL (ref 1.5–6.5)
NEUT%: 75.6 % — ABNORMAL HIGH (ref 39.0–75.0)
Platelets: 113 10*3/uL — ABNORMAL LOW (ref 140–400)
RBC: 4.56 10*6/uL (ref 4.20–5.82)
RDW: 13.1 % (ref 11.0–14.6)
WBC: 2.5 10*3/uL — ABNORMAL LOW (ref 4.0–10.3)
nRBC: 0 % (ref 0–0)

## 2014-11-23 MED ORDER — DIPHENHYDRAMINE HCL 50 MG/ML IJ SOLN
25.0000 mg | Freq: Once | INTRAMUSCULAR | Status: AC
  Administered 2014-11-23: 25 mg via INTRAVENOUS

## 2014-11-23 MED ORDER — PACLITAXEL CHEMO INJECTION 300 MG/50ML
50.0000 mg/m2 | Freq: Once | INTRAVENOUS | Status: AC
  Administered 2014-11-23: 120 mg via INTRAVENOUS
  Filled 2014-11-23: qty 20

## 2014-11-23 MED ORDER — FAMOTIDINE IN NACL 20-0.9 MG/50ML-% IV SOLN
20.0000 mg | Freq: Once | INTRAVENOUS | Status: AC
  Administered 2014-11-23: 20 mg via INTRAVENOUS

## 2014-11-23 MED ORDER — FAMOTIDINE IN NACL 20-0.9 MG/50ML-% IV SOLN
INTRAVENOUS | Status: AC
  Filled 2014-11-23: qty 50

## 2014-11-23 MED ORDER — DEXAMETHASONE SODIUM PHOSPHATE 100 MG/10ML IJ SOLN
Freq: Once | INTRAMUSCULAR | Status: AC
  Administered 2014-11-23: 12:00:00 via INTRAVENOUS
  Filled 2014-11-23: qty 8

## 2014-11-23 MED ORDER — SODIUM CHLORIDE 0.9 % IV SOLN
249.4000 mg | Freq: Once | INTRAVENOUS | Status: AC
  Administered 2014-11-23: 250 mg via INTRAVENOUS
  Filled 2014-11-23: qty 25

## 2014-11-23 MED ORDER — SODIUM CHLORIDE 0.9 % IV SOLN
Freq: Once | INTRAVENOUS | Status: AC
  Administered 2014-11-23: 10:00:00 via INTRAVENOUS

## 2014-11-23 MED ORDER — DIPHENHYDRAMINE HCL 50 MG/ML IJ SOLN
INTRAMUSCULAR | Status: AC
  Filled 2014-11-23: qty 1

## 2014-11-23 NOTE — Progress Notes (Signed)
OK to treat with today's CBC

## 2014-11-24 ENCOUNTER — Ambulatory Visit
Admission: RE | Admit: 2014-11-24 | Discharge: 2014-11-24 | Disposition: A | Discharge status: Home / Self Care | Payer: Medicare Other | Source: Ambulatory Visit | Attending: Radiation Oncology | Admitting: Radiation Oncology

## 2014-11-24 DIAGNOSIS — M1990 Unspecified osteoarthritis, unspecified site: Secondary | ICD-10-CM | POA: Diagnosis not present

## 2014-11-24 DIAGNOSIS — Z79899 Other long term (current) drug therapy: Secondary | ICD-10-CM | POA: Diagnosis not present

## 2014-11-24 DIAGNOSIS — Z7982 Long term (current) use of aspirin: Secondary | ICD-10-CM | POA: Diagnosis not present

## 2014-11-24 DIAGNOSIS — Z510 Encounter for antineoplastic radiation therapy: Secondary | ICD-10-CM | POA: Diagnosis not present

## 2014-11-24 DIAGNOSIS — N400 Enlarged prostate without lower urinary tract symptoms: Secondary | ICD-10-CM | POA: Diagnosis not present

## 2014-11-24 DIAGNOSIS — C155 Malignant neoplasm of lower third of esophagus: Secondary | ICD-10-CM | POA: Diagnosis not present

## 2014-11-25 ENCOUNTER — Ambulatory Visit
Admission: RE | Admit: 2014-11-25 | Discharge: 2014-11-25 | Disposition: A | Discharge status: Home / Self Care | Payer: Medicare Other | Source: Ambulatory Visit | Attending: Radiation Oncology | Admitting: Radiation Oncology

## 2014-11-25 DIAGNOSIS — Z7982 Long term (current) use of aspirin: Secondary | ICD-10-CM | POA: Diagnosis not present

## 2014-11-25 DIAGNOSIS — Z510 Encounter for antineoplastic radiation therapy: Secondary | ICD-10-CM | POA: Diagnosis not present

## 2014-11-25 DIAGNOSIS — N400 Enlarged prostate without lower urinary tract symptoms: Secondary | ICD-10-CM | POA: Diagnosis not present

## 2014-11-25 DIAGNOSIS — M1990 Unspecified osteoarthritis, unspecified site: Secondary | ICD-10-CM | POA: Diagnosis not present

## 2014-11-25 DIAGNOSIS — Z79899 Other long term (current) drug therapy: Secondary | ICD-10-CM | POA: Diagnosis not present

## 2014-11-25 DIAGNOSIS — C155 Malignant neoplasm of lower third of esophagus: Secondary | ICD-10-CM | POA: Diagnosis not present

## 2014-11-26 ENCOUNTER — Other Ambulatory Visit (HOSPITAL_BASED_OUTPATIENT_CLINIC_OR_DEPARTMENT_OTHER): Payer: Medicare Other

## 2014-11-26 ENCOUNTER — Ambulatory Visit (HOSPITAL_BASED_OUTPATIENT_CLINIC_OR_DEPARTMENT_OTHER): Payer: Medicare Other | Admitting: Oncology

## 2014-11-26 ENCOUNTER — Telehealth: Payer: Self-pay | Admitting: Oncology

## 2014-11-26 ENCOUNTER — Other Ambulatory Visit: Payer: Medicare Other

## 2014-11-26 ENCOUNTER — Ambulatory Visit
Admission: RE | Admit: 2014-11-26 | Discharge: 2014-11-26 | Disposition: A | Discharge status: Home / Self Care | Payer: Medicare Other | Source: Ambulatory Visit | Attending: Radiation Oncology | Admitting: Radiation Oncology

## 2014-11-26 VITALS — BP 133/71 | HR 100 | Temp 98.3°F | Resp 18 | Ht 76.0 in | Wt 237.8 lb

## 2014-11-26 DIAGNOSIS — Z79899 Other long term (current) drug therapy: Secondary | ICD-10-CM | POA: Diagnosis not present

## 2014-11-26 DIAGNOSIS — N400 Enlarged prostate without lower urinary tract symptoms: Secondary | ICD-10-CM

## 2014-11-26 DIAGNOSIS — M1990 Unspecified osteoarthritis, unspecified site: Secondary | ICD-10-CM | POA: Diagnosis not present

## 2014-11-26 DIAGNOSIS — I10 Essential (primary) hypertension: Secondary | ICD-10-CM

## 2014-11-26 DIAGNOSIS — Z510 Encounter for antineoplastic radiation therapy: Secondary | ICD-10-CM | POA: Diagnosis not present

## 2014-11-26 DIAGNOSIS — C155 Malignant neoplasm of lower third of esophagus: Secondary | ICD-10-CM

## 2014-11-26 DIAGNOSIS — Z7982 Long term (current) use of aspirin: Secondary | ICD-10-CM | POA: Diagnosis not present

## 2014-11-26 LAB — CBC WITH DIFFERENTIAL/PLATELET
BASO%: 0.5 % (ref 0.0–2.0)
Basophils Absolute: 0 10*3/uL (ref 0.0–0.1)
EOS%: 0.3 % (ref 0.0–7.0)
Eosinophils Absolute: 0 10*3/uL (ref 0.0–0.5)
HCT: 44.1 % (ref 38.4–49.9)
HEMOGLOBIN: 14.7 g/dL (ref 13.0–17.1)
LYMPH%: 7.3 % — ABNORMAL LOW (ref 14.0–49.0)
MCH: 31.6 pg (ref 27.2–33.4)
MCHC: 33.4 g/dL (ref 32.0–36.0)
MCV: 94.8 fL (ref 79.3–98.0)
MONO#: 0.2 10*3/uL (ref 0.1–0.9)
MONO%: 6.8 % (ref 0.0–14.0)
NEUT%: 85.1 % — ABNORMAL HIGH (ref 39.0–75.0)
NEUTROS ABS: 2.3 10*3/uL (ref 1.5–6.5)
Platelets: 102 10*3/uL — ABNORMAL LOW (ref 140–400)
RBC: 4.65 10*6/uL (ref 4.20–5.82)
RDW: 13.1 % (ref 11.0–14.6)
WBC: 2.7 10*3/uL — ABNORMAL LOW (ref 4.0–10.3)
lymph#: 0.2 10*3/uL — ABNORMAL LOW (ref 0.9–3.3)

## 2014-11-26 LAB — COMPREHENSIVE METABOLIC PANEL (CC13)
ALBUMIN: 3.4 g/dL — AB (ref 3.5–5.0)
ALK PHOS: 60 U/L (ref 40–150)
ALT: 15 U/L (ref 0–55)
AST: 16 U/L (ref 5–34)
Anion Gap: 7 mEq/L (ref 3–11)
BUN: 19 mg/dL (ref 7.0–26.0)
CALCIUM: 8.8 mg/dL (ref 8.4–10.4)
CO2: 28 mEq/L (ref 22–29)
Chloride: 108 mEq/L (ref 98–109)
Creatinine: 0.8 mg/dL (ref 0.7–1.3)
EGFR: >90 mL/min/{1.73_m2} (ref >90)
Glucose: 94 mg/dl (ref 70–140)
Potassium: 4.1 mEq/L (ref 3.5–5.1)
Sodium: 142 mEq/L (ref 136–145)
Total Bilirubin: 0.93 mg/dL (ref 0.20–1.20)
Total Protein: 6 g/dL — ABNORMAL LOW (ref 6.4–8.3)

## 2014-11-26 NOTE — Telephone Encounter (Signed)
per pof to sch pt appt-gave pt copy of avs °

## 2014-11-26 NOTE — Progress Notes (Signed)
  [facility] OFFICE PROGRESS NOTE   Diagnosis: Esophagus cancer  INTERVAL HISTORY:   David [name] returns as scheduled. He has completed 4 weekly treatments with Taxol/carboplatin. He continues daily radiation. He reports tolerating the treatment well. No nausea/vomiting or neuropathy symptoms. No significant dysphagia and no esophagitis. He walks daily.  Objective:  Vital signs in last 24 hours:  Blood pressure 133/71, pulse 100, temperature 98.3 F (36.8 C), temperature source Oral, resp. rate 18, height 6\' 4"  (1.93 m), weight 237 lb 12.8 oz (107.865 kg), SpO2 98 %.    HEENT: No thrush or ulcers Resp: Lungs clear bilaterally Cardio: Regular rate and rhythm GI: No hepatomegaly, nontender, no mass Vascular: No leg edema   Lab Results:  Lab Results  Component Value Date   WBC 2.7* 11/26/2014   HGB 14.7 11/26/2014   HCT 44.1 11/26/2014   MCV 94.8 11/26/2014   PLT 102* 11/26/2014   NEUTROABS 2.3 11/26/2014     Medications: I have reviewed the patient's current medications.  Assessment/Plan: 1. Adenocarcinoma of the distal esophagus,uT3uN  Presenting with a food impaction and dysphagia 08/20/2014  Staging CT scans of the chest, abdomen, and pelvis 10/06/2014 with indeterminate pulmonary nodules, a 9 mm paraesophageal lymph node, and circumferential thickening of the distal esophagus  PET scan 10/23/2014 confirmed hypermetabolic thickening at the distal esophagus with small paraesophageal nodes having faint nonspecific activity. A pleural-based focus of consolidation at the posterior right lower lobe had low metabolic activity-favored to be a benign process.  Initiation of concurrent weekly Taxol/carboplatin and radiation on 11/02/2014  2. Prostatic hypertrophy  3. Hypertension  4. History of multiple basal cell skin cancers    Disposition:  David [name] is tolerating the chemotherapy and radiation well. He will complete a final treatment with  Taxol/carboplatin on 11/30/2014. He is scheduled to see Dr. Servando Snare on 12/03/2014.  David [name] will return for an office visit 12/30/2014. Dr. Servando Snare can direct restaging studies prior to surgery.     Betsy Coder, MD  11/26/2014  12:10 PM

## 2014-11-27 ENCOUNTER — Encounter: Payer: Self-pay | Admitting: Radiation Oncology

## 2014-11-27 ENCOUNTER — Ambulatory Visit
Admission: RE | Admit: 2014-11-27 | Discharge: 2014-11-27 | Disposition: A | Discharge status: Home / Self Care | Payer: Medicare Other | Source: Ambulatory Visit | Attending: Radiation Oncology | Admitting: Radiation Oncology

## 2014-11-27 VITALS — BP 152/71 | HR 92 | Resp 16 | Wt 238.2 lb

## 2014-11-27 DIAGNOSIS — Z7982 Long term (current) use of aspirin: Secondary | ICD-10-CM | POA: Diagnosis not present

## 2014-11-27 DIAGNOSIS — C155 Malignant neoplasm of lower third of esophagus: Secondary | ICD-10-CM

## 2014-11-27 DIAGNOSIS — Z510 Encounter for antineoplastic radiation therapy: Secondary | ICD-10-CM | POA: Diagnosis not present

## 2014-11-27 DIAGNOSIS — M1990 Unspecified osteoarthritis, unspecified site: Secondary | ICD-10-CM | POA: Diagnosis not present

## 2014-11-27 DIAGNOSIS — Z79899 Other long term (current) drug therapy: Secondary | ICD-10-CM | POA: Diagnosis not present

## 2014-11-27 DIAGNOSIS — N400 Enlarged prostate without lower urinary tract symptoms: Secondary | ICD-10-CM | POA: Diagnosis not present

## 2014-11-27 NOTE — Progress Notes (Signed)
   Department of Radiation Oncology  Phone:  [phone] Fax:        [phone]  Weekly Treatment Note    Name: David [name] Date: 11/27/2014 MRN: [id number] DOB: 1940/05/31   Current dose: 36 Gy  Current fraction: 20   MEDICATIONS: Current Outpatient Prescriptions  Medication Sig Dispense Refill  . ALPRAZolam (XANAX) 0.5 MG tablet Take 0.5 mg by mouth 2 (two) times daily as needed for anxiety.   4  . amLODipine (NORVASC) 5 MG tablet Take 5 mg by mouth daily.  0  . aspirin 81 MG tablet Take 81 mg by mouth daily.    . benazepril (LOTENSIN) 40 MG tablet Take 40 mg by mouth daily.    Marland Kitchen emollient (BIAFINE) cream Apply topically as needed. 454 g 0  . furosemide (LASIX) 40 MG tablet Take 40 mg by mouth daily.  3  . Liniments (DEEP BLUE RELIEF) GEL Apply 1 application topically daily as needed (FOR PAIN).    Marland Kitchen pantoprazole (PROTONIX) 40 MG tablet Take 1 tablet (40 mg total) by mouth daily. 90 tablet 3  . Tamsulosin HCl (FLOMAX) 0.4 MG CAPS Take 0.4 mg by mouth daily after supper.    . prochlorperazine (COMPAZINE) 10 MG tablet Take 1 tablet (10 mg total) by mouth every 6 (six) hours as needed for nausea or vomiting. (Patient not taking: Reported on 11/16/2014) 20 tablet 1   No current facility-administered medications for this encounter.     ALLERGIES: Nexium; Other; and Statins   LABORATORY DATA:  Lab Results  Component Value Date   WBC 2.7* 11/26/2014   HGB 14.7 11/26/2014   HCT 44.1 11/26/2014   MCV 94.8 11/26/2014   PLT 102* 11/26/2014   Lab Results  Component Value Date   NA 142 11/26/2014   K 4.1 11/26/2014   CL 110 08/20/2014   CO2 28 11/26/2014   Lab Results  Component Value Date   ALT 15 11/26/2014   AST 16 11/26/2014   ALKPHOS 60 11/26/2014   BILITOT 0.93 11/26/2014     NARRATIVE: David [name] was seen today for weekly treatment management. The chart was checked and the patient's films were reviewed.  Weight and vitals stable. No skin changes noted  within treatment field. Reports using Biafine bid as directed. Denies pain or difficulty associated with swallowing. Reports mild fatigue. Reports difficulty sleeping related to long history of nocturia.   PHYSICAL EXAMINATION: weight is 238 lb 3.2 oz (108.047 kg). His blood pressure is 152/71 and his pulse is 92. His respiration is 16.        ASSESSMENT: The patient is doing satisfactorily with treatment.  PLAN: We will continue with the patient's radiation treatment as planned.     ------------------------------------------------  Jodelle Gross, MD, PhD  This document serves as a record of services personally performed by Kyung Rudd, MD. It was created on his behalf by Derek Mound, a trained medical scribe. The creation of this record is based on the scribe's personal observations and the provider's statements to them. This document has been checked and approved by the attending provider.

## 2014-11-27 NOTE — Progress Notes (Signed)
Weight and vitals stable. No skin changes noted within treatment field. Reports using Biafine bid as directed. Denies pain or difficulty associated with swallowing. Reports mild fatigue. Reports difficulty sleeping related to long history of nocturia.   BP 152/71 mmHg  Pulse 92  Resp 16  Wt 238 lb 3.2 oz (108.047 kg) Wt Readings from Last 3 Encounters:  11/27/14 238 lb 3.2 oz (108.047 kg)  11/26/14 237 lb 12.8 oz (107.865 kg)  11/19/14 239 lb 3.2 oz (108.5 kg)

## 2014-11-29 ENCOUNTER — Other Ambulatory Visit: Payer: Self-pay | Admitting: Oncology

## 2014-11-30 ENCOUNTER — Ambulatory Visit (HOSPITAL_BASED_OUTPATIENT_CLINIC_OR_DEPARTMENT_OTHER): Payer: Medicare Other

## 2014-11-30 ENCOUNTER — Telehealth: Payer: Self-pay | Admitting: Oncology

## 2014-11-30 ENCOUNTER — Ambulatory Visit
Admission: RE | Admit: 2014-11-30 | Discharge: 2014-11-30 | Disposition: A | Discharge status: Home / Self Care | Payer: Medicare Other | Source: Ambulatory Visit | Attending: Radiation Oncology | Admitting: Radiation Oncology

## 2014-11-30 DIAGNOSIS — Z7982 Long term (current) use of aspirin: Secondary | ICD-10-CM | POA: Diagnosis not present

## 2014-11-30 DIAGNOSIS — C155 Malignant neoplasm of lower third of esophagus: Secondary | ICD-10-CM

## 2014-11-30 DIAGNOSIS — Z79899 Other long term (current) drug therapy: Secondary | ICD-10-CM | POA: Diagnosis not present

## 2014-11-30 DIAGNOSIS — M1990 Unspecified osteoarthritis, unspecified site: Secondary | ICD-10-CM | POA: Diagnosis not present

## 2014-11-30 DIAGNOSIS — N400 Enlarged prostate without lower urinary tract symptoms: Secondary | ICD-10-CM | POA: Diagnosis not present

## 2014-11-30 DIAGNOSIS — Z510 Encounter for antineoplastic radiation therapy: Secondary | ICD-10-CM | POA: Diagnosis not present

## 2014-11-30 DIAGNOSIS — Z5111 Encounter for antineoplastic chemotherapy: Secondary | ICD-10-CM

## 2014-11-30 LAB — CBC WITH DIFFERENTIAL/PLATELET
BASO%: 0.4 % (ref 0.0–2.0)
Basophils Absolute: 0 10*3/uL (ref 0.0–0.1)
EOS%: 0.7 % (ref 0.0–7.0)
Eosinophils Absolute: 0 10*3/uL (ref 0.0–0.5)
HEMATOCRIT: 42.6 % (ref 38.4–49.9)
HGB: 14.6 g/dL (ref 13.0–17.1)
LYMPH%: 9.1 % — ABNORMAL LOW (ref 14.0–49.0)
MCH: 32.2 pg (ref 27.2–33.4)
MCHC: 34.3 g/dL (ref 32.0–36.0)
MCV: 94 fL (ref 79.3–98.0)
MONO#: 0.3 10*3/uL (ref 0.1–0.9)
MONO%: 9.8 % (ref 0.0–14.0)
NEUT%: 80 % — ABNORMAL HIGH (ref 39.0–75.0)
NEUTROS ABS: 2.2 10*3/uL (ref 1.5–6.5)
Platelets: 92 10*3/uL — ABNORMAL LOW (ref 140–400)
RBC: 4.53 10*6/uL (ref 4.20–5.82)
RDW: 13.4 % (ref 11.0–14.6)
WBC: 2.8 10*3/uL — ABNORMAL LOW (ref 4.0–10.3)
lymph#: 0.3 10*3/uL — ABNORMAL LOW (ref 0.9–3.3)

## 2014-11-30 MED ORDER — DIPHENHYDRAMINE HCL 50 MG/ML IJ SOLN
INTRAMUSCULAR | Status: AC
  Filled 2014-11-30: qty 1

## 2014-11-30 MED ORDER — PACLITAXEL CHEMO INJECTION 300 MG/50ML
50.0000 mg/m2 | Freq: Once | INTRAVENOUS | Status: AC
  Administered 2014-11-30: 120 mg via INTRAVENOUS
  Filled 2014-11-30: qty 20

## 2014-11-30 MED ORDER — FAMOTIDINE IN NACL 20-0.9 MG/50ML-% IV SOLN
20.0000 mg | Freq: Once | INTRAVENOUS | Status: AC
  Administered 2014-11-30: 20 mg via INTRAVENOUS

## 2014-11-30 MED ORDER — SODIUM CHLORIDE 0.9 % IV SOLN
Freq: Once | INTRAVENOUS | Status: AC
  Administered 2014-11-30: 11:00:00 via INTRAVENOUS
  Filled 2014-11-30: qty 8

## 2014-11-30 MED ORDER — FAMOTIDINE IN NACL 20-0.9 MG/50ML-% IV SOLN
INTRAVENOUS | Status: AC
  Filled 2014-11-30: qty 50

## 2014-11-30 MED ORDER — SODIUM CHLORIDE 0.9 % IV SOLN
Freq: Once | INTRAVENOUS | Status: AC
  Administered 2014-11-30: 10:00:00 via INTRAVENOUS

## 2014-11-30 MED ORDER — DIPHENHYDRAMINE HCL 50 MG/ML IJ SOLN
25.0000 mg | Freq: Once | INTRAMUSCULAR | Status: AC
  Administered 2014-11-30: 25 mg via INTRAVENOUS

## 2014-11-30 MED ORDER — SODIUM CHLORIDE 0.9 % IV SOLN
250.0000 mg | Freq: Once | INTRAVENOUS | Status: AC
  Administered 2014-11-30: 250 mg via INTRAVENOUS
  Filled 2014-11-30: qty 25

## 2014-11-30 NOTE — Progress Notes (Signed)
CBC reviewed by Dr. Benay Spice: OK to treat despite PLT 92k. Schedule lab for nadir counts. Order entered.

## 2014-11-30 NOTE — Telephone Encounter (Signed)
Lab appointment added per pof and patient will get a new schedule in chemo today

## 2014-12-01 ENCOUNTER — Ambulatory Visit
Admission: RE | Admit: 2014-12-01 | Discharge: 2014-12-01 | Disposition: A | Discharge status: Home / Self Care | Payer: Medicare Other | Source: Ambulatory Visit | Attending: Radiation Oncology | Admitting: Radiation Oncology

## 2014-12-01 DIAGNOSIS — Z510 Encounter for antineoplastic radiation therapy: Secondary | ICD-10-CM | POA: Diagnosis not present

## 2014-12-01 DIAGNOSIS — Z7982 Long term (current) use of aspirin: Secondary | ICD-10-CM | POA: Diagnosis not present

## 2014-12-01 DIAGNOSIS — Z79899 Other long term (current) drug therapy: Secondary | ICD-10-CM | POA: Diagnosis not present

## 2014-12-01 DIAGNOSIS — C155 Malignant neoplasm of lower third of esophagus: Secondary | ICD-10-CM | POA: Diagnosis not present

## 2014-12-01 DIAGNOSIS — N400 Enlarged prostate without lower urinary tract symptoms: Secondary | ICD-10-CM | POA: Diagnosis not present

## 2014-12-01 DIAGNOSIS — M1990 Unspecified osteoarthritis, unspecified site: Secondary | ICD-10-CM | POA: Diagnosis not present

## 2014-12-02 ENCOUNTER — Ambulatory Visit
Admission: RE | Admit: 2014-12-02 | Discharge: 2014-12-02 | Disposition: A | Discharge status: Home / Self Care | Payer: Medicare Other | Source: Ambulatory Visit | Attending: Radiation Oncology | Admitting: Radiation Oncology

## 2014-12-02 DIAGNOSIS — Z7982 Long term (current) use of aspirin: Secondary | ICD-10-CM | POA: Diagnosis not present

## 2014-12-02 DIAGNOSIS — C155 Malignant neoplasm of lower third of esophagus: Secondary | ICD-10-CM | POA: Diagnosis not present

## 2014-12-02 DIAGNOSIS — N400 Enlarged prostate without lower urinary tract symptoms: Secondary | ICD-10-CM | POA: Diagnosis not present

## 2014-12-02 DIAGNOSIS — Z79899 Other long term (current) drug therapy: Secondary | ICD-10-CM | POA: Diagnosis not present

## 2014-12-02 DIAGNOSIS — M1990 Unspecified osteoarthritis, unspecified site: Secondary | ICD-10-CM | POA: Diagnosis not present

## 2014-12-02 DIAGNOSIS — Z510 Encounter for antineoplastic radiation therapy: Secondary | ICD-10-CM | POA: Diagnosis not present

## 2014-12-03 ENCOUNTER — Encounter: Payer: Self-pay | Admitting: Radiation Oncology

## 2014-12-03 ENCOUNTER — Ambulatory Visit (INDEPENDENT_AMBULATORY_CARE_PROVIDER_SITE_OTHER): Payer: Medicare Other | Admitting: Cardiothoracic Surgery

## 2014-12-03 ENCOUNTER — Encounter: Payer: Self-pay | Admitting: Cardiothoracic Surgery

## 2014-12-03 ENCOUNTER — Ambulatory Visit
Admission: RE | Admit: 2014-12-03 | Discharge: 2014-12-03 | Disposition: A | Discharge status: Home / Self Care | Payer: Medicare Other | Source: Ambulatory Visit | Attending: Radiation Oncology | Admitting: Radiation Oncology

## 2014-12-03 ENCOUNTER — Other Ambulatory Visit: Payer: Self-pay | Admitting: *Deleted

## 2014-12-03 VITALS — BP 146/62 | HR 101 | Resp 20 | Ht 76.0 in | Wt 241.0 lb

## 2014-12-03 VITALS — BP 135/69 | HR 95 | Resp 16 | Wt 241.1 lb

## 2014-12-03 DIAGNOSIS — N400 Enlarged prostate without lower urinary tract symptoms: Secondary | ICD-10-CM | POA: Diagnosis not present

## 2014-12-03 DIAGNOSIS — Z7982 Long term (current) use of aspirin: Secondary | ICD-10-CM | POA: Diagnosis not present

## 2014-12-03 DIAGNOSIS — Z510 Encounter for antineoplastic radiation therapy: Secondary | ICD-10-CM | POA: Diagnosis not present

## 2014-12-03 DIAGNOSIS — C155 Malignant neoplasm of lower third of esophagus: Secondary | ICD-10-CM | POA: Diagnosis not present

## 2014-12-03 DIAGNOSIS — M1990 Unspecified osteoarthritis, unspecified site: Secondary | ICD-10-CM | POA: Diagnosis not present

## 2014-12-03 DIAGNOSIS — C160 Malignant neoplasm of cardia: Secondary | ICD-10-CM | POA: Diagnosis not present

## 2014-12-03 DIAGNOSIS — Z79899 Other long term (current) drug therapy: Secondary | ICD-10-CM | POA: Diagnosis not present

## 2014-12-03 NOTE — Progress Notes (Signed)
  Radiation Oncology         [phone]   Name: David [name] MRN: [id number]   Date: 12/03/2014  DOB: 1941-01-14     Weekly Radiation Therapy Management   ICD-9-CM ICD-10-CM   1. Cancer of lower third of esophagus 150.5 C15.5     Diagnosis: David [name] is a 74 year old gentleman presenting to clinic in regards to his cancer of lower third of the esophagus.  Current Dose: 43.2 Gy  Planned Dose:  50.4 Gy  Narrative The patient presents for routine under treatment assessment. No skin changes noted within treatment field. Reports using biafine bid as directed. Reports his only difficulty while swallowing was yesterday during breakfast, but had no problems since or before then. Reports mild fatigue.   Physical Findings  weight is 241 lb 1.6 oz (109.362 kg). His blood pressure is 135/69 and his pulse is 95. His respiration is 16 and oxygen saturation is 100%.  His weight is essentially stable with no significant changes in overall health to be noted at this time.  Impression David [name] is a 74 year old gentleman presenting to clinic in regards to his cancer of the lower third of the esophagus. The patient is tolerating radiation well.  Plan Continue radiation therapy as planned.   This document serves as a record of services personally performed by Tyler Pita, MD. It was created on his behalf by Darcus Austin, a trained medical scribe. The creation of this record is based on the scribe's personal observations and the provider's statements to them. This document has been checked and approved by the attending provider.     _________________________________________   Sheral Apley. Tammi Klippel, M.D.

## 2014-12-03 NOTE — Progress Notes (Signed)
[address]       [place] [address]             [phone]                    Rien A Speedy [place] Medical Record #[id number] Date of Birth: [birth year]  Referring: Dr Benay Spice Primary Care: Marton Redwood, MD  Chief Complaint:    Chief Complaint  Patient presents with  . Esophageal Cancer    Further discuss surgery, completed Chemo, wil finish Radiation next week    History of Present Illness:    David [name] 74 y.o. male is seen in the office  today for esophageal cancer. Patient noted  February 2016 some difficulty in swallowing and painful swallowing. This acutely became a problem with food obstruction requiring endoscopy. Endoscopy confirmed a distal esophageal mass at the GE junction. Subsequent biopsy confirmed adenocarcinoma. CT scan of the chest and abdomen and PET scan has been performed and esophageal ultrasound.  Patient has been able to maintain his weight and nutrition. He started on Taxol carboplatinum completing his course of preoperative chemotherapy on 11/30/2014 he will complete radiation therapy on August 17.  He has tolerated the radiation chemotherapy very well with minimal side effects. Notes that he's been able to maintain his usual level of activity. His swallowing has improved  Current Activity/ Functional Status:  Patient is independent with mobility/ambulation, transfers, ADL's, IADL's.   Zubrod Score: At the time of surgery this patient's most appropriate activity status/level should be described as: [x]     0    Normal activity, no symptoms []     1    Restricted in physical strenuous activity but ambulatory, able to do out light work []     2    Ambulatory and capable of self care, unable to do work activities, up and about               >50 % of waking hours                              []     3    Only limited self care, in bed greater than 50% of waking hours []     4    Completely disabled, no self care, confined to bed or  chair []     5    Moribund   Past Medical History  Diagnosis Date  . Hypertension   . Anxiety   . Arthritis   . Basal cell carcinoma     nose, face, back  . Food impaction of esophagus 08/20/2014  . Esophageal cancer   . Enlarged prostate   . Allergy     Past Surgical History  Procedure Laterality Date  . Total knee arthroplasty  2010    right  . Cervical laminectomy  1980  . Appendectomy  1961  . Colonoscopy    . Esophagogastroduodenoscopy [name] 08/20/2014    Procedure: ESOPHAGOGASTRODUODENOSCOPY (EGD);  Surgeon: Gatha Mayer, MD;  Location: [facility] ENDOSCOPY;  Service: Endoscopy;  Laterality: [name];  . Eus [name] 10/15/2014    Procedure: UPPER ENDOSCOPIC ULTRASOUND (EUS) LINEAR;  Surgeon: Milus Banister, MD;  Location: [facility] ENDOSCOPY;  Service: Endoscopy;  Laterality: [name];  radial linear    Family History  Problem Relation Age of Onset  . Stomach cancer Neg Hx   . Colon cancer Neg Hx   . Lymphoma  Mother   . Cancer Maternal Uncle   . Cancer Paternal Uncle   . Cancer Paternal Grandmother    Patient had one son who died at age 85 of adenocarcinoma the lung was a nonsmoker, presented with brain metastases and lived approximate 38 months post diagnosis. His father had bypass surgery developed a blood dyscrasia and died at age 52, his mother had lymphoma and died at age 50.   History   Social History  . Marital Status: Married    Spouse Name: [name]  . Number of Children: [name]  . Years of Education: [name]   Occupational History  .  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 Smoking status: Former Smoker    Quit date: 06/07/1961  . Smokeless tobacco: Never Used  . Alcohol Use: No  . Drug Use: No  . Sexual Activity: Not Currently            History  Smoking status  . Former Smoker  . Quit date: 06/07/1961  Smokeless tobacco  . Never Used    History  Alcohol Use No     Allergies  Allergen Reactions  . Nexium [Esomeprazole  Magnesium] Other (See Comments)    Difficulty urinating and passing stool; dry mouth  . Other     Thinks it was oxycodone; made him hallucinate  . Statins Other (See Comments)    Leg cramping    Current Outpatient Prescriptions  Medication Sig Dispense Refill  . ALPRAZolam (XANAX) 0.5 MG tablet Take 0.5 mg by mouth 2 (two) times daily as needed for anxiety.   4  . amLODipine (NORVASC) 5 MG tablet Take 5 mg by mouth daily.  0  . aspirin 81 MG tablet Take 81 mg by mouth daily.    . benazepril (LOTENSIN) 40 MG tablet Take 40 mg by mouth daily.    Marland Kitchen emollient (BIAFINE) cream Apply topically as needed. 454 g 0  . furosemide (LASIX) 40 MG tablet Take 40 mg by mouth daily.  3  . Liniments (DEEP BLUE RELIEF) GEL Apply 1 application topically daily as needed (FOR PAIN).    Marland Kitchen pantoprazole (PROTONIX) 40 MG tablet Take 1 tablet (40 mg total) by mouth daily. 90 tablet 3  . prochlorperazine (COMPAZINE) 10 MG tablet Take 1 tablet (10 mg total) by mouth every 6 (six) hours as needed for nausea or vomiting. 20 tablet 1  . Tamsulosin HCl (FLOMAX) 0.4 MG CAPS Take 0.4 mg by mouth daily after supper.     No current facility-administered medications for this visit.      Review of Systems:     Cardiac Review of Systems: Y or N  Chest Pain [   n ]  Resting SOB [  n ] Exertional SOB  [n  ]  Orthopnea [ n ]   Pedal Edema [n   ]    Palpitations [ 25 years ago ] Syncope  [ n ]   Presyncope [n   ]  General Review of Systems: [Y] = yes [  ]=no Constitional: recent weight change [  ];  Wt loss over the last 3 months [  260 to 236 had been trying to loose weight ] anorexia [  ]; fatigue [  ]; nausea [  ]; night sweats [  ]; fever [  ]; or chills [  ];          Dental: poor dentition[  ];  Last Dentist visit:   Eye : blurred vision [  ]; diplopia [   ]; vision changes [ y ];  Amaurosis fugax[  ]; Resp: cough [  ];  wheezing[  ];  hemoptysis[  ]; shortness of breath[  ]; paroxysmal nocturnal dyspnea[  ]; dyspnea on  exertion[  ]; or orthopnea[  ];  GI:  gallstones[  ], vomiting[  ];  dysphagia[  ]; melena[  ];  hematochezia [  ]; heartburn[  ];   Hx of  Colonoscopy[y  ]; GU: kidney stones [ n ]; hematuria[ n ];   dysuria [  ];  nocturia[y  ];  history of     obstruction [  n]; urinary frequency Blue.Reese  ]             Skin: rash, swelling[  ];, hair loss[  ];  peripheral edema[  ];  or itching[  ]; Musculosketetal: myalgias[y  ];  joint swelling[  ];  joint erythema[  ];  joint pain[  ];  back pain[  ];  Heme/Lymph: bruising[  ];  bleeding[  ];  anemia[  ];  Neuro: TIA[n  ];  headaches[ n ];  stroke[n  ];  vertigo[  ];  seizures[n  ];   paresthesias[ n ];  difficulty walking[n  ];  Psych:depression[  ]; anxiety[  ];  Endocrine: diabetes[  ];  thyroid dysfunction[  ];  Immunizations: Flu up to date Florencio.Farrier  ]; Pneumococcal up to date [ y ];  Other:  Physical Exam: BP 146/62 mmHg  Pulse 101  Resp 20  Ht 6\' 4"  (1.93 m)  Wt 241 lb (109.317 kg)  BMI 29.35 kg/m2  SpO2 98%  PHYSICAL EXAMINATION: General appearance: alert and cooperative Head: Normocephalic, without obvious abnormality, atraumatic Neck: no adenopathy, no carotid bruit, no JVD, supple, symmetrical, trachea midline and thyroid not enlarged, symmetric, no tenderness/mass/nodules Lymph nodes: Cervical, supraclavicular, and axillary nodes normal. Resp: clear to auscultation bilaterally Back: symmetric, no curvature. ROM normal. No CVA tenderness. Cardio: regular rate and rhythm, S1, S2 normal, no murmur, click, rub or gallop GI: soft, non-tender; bowel sounds normal; no masses,  no organomegaly Extremities: extremities normal, atraumatic, no cyanosis or edema and Homans sign is negative, no sign of DVT Neurologic: Grossly normal Patient has no carotid bruits, palpable DP and PT pulses bilaterally Diagnostic Studies & Laboratory data:     Recent Radiology Findings:   Ct Abdomen Pelvis W Contrast  10/06/2014   CLINICAL DATA:  74 year old male with  difficulty swallowing for the past 2-3 months. Newly diagnosed adenocarcinoma of the esophagus. Evaluate for metastatic disease.  EXAM: CT CHEST AND ABDOMEN WITH CONTRAST  TECHNIQUE: Multidetector CT imaging of the chest and abdomen was performed following the standard protocol during bolus administration of intravenous contrast.  CONTRAST:  134mL OMNIPAQUE IOHEXOL 300 MG/ML  SOLN  COMPARISON:  No priors.  FINDINGS: CT CHEST FINDINGS  Mediastinum/Lymph Nodes: Heart size is normal. There is no significant pericardial fluid, thickening or pericardial calcification. There is atherosclerosis of the thoracic aorta, the great vessels of the mediastinum and the coronary arteries, including calcified atherosclerotic plaque in the right coronary artery. Calcifications of the mitral annulus and posterior leaflet of the mitral valve. Calcifications of the aortic valve. Circumferential thickening of the distal esophagus immediately above the gastroesophageal junction. 9 mm paraesophageal lymph node in the lower mediastinum (image 48 of series 2). No other pathologically enlarged mediastinal or hilar lymph nodes are noted. No axillary lymphadenopathy.  Lungs/Pleura: Multiple small pulmonary nodules scattered throughout the lungs bilaterally, the largest of which is a pleural-based 15 x 7 mm nodule (image 32 of series 3), which is surrounded by a halo of ground-glass attenuation which in total measures 16 x 24 mm. Mild linear scarring in the left lower lobe. No acute consolidative airspace disease. No pleural effusions.  Musculoskeletal/Soft Tissues: There are no aggressive appearing lytic or blastic lesions noted in the visualized portions of the skeleton.  CT ABDOMEN AND PELVIS FINDINGS  Hepatobiliary: No cystic or solid hepatic lesions. No intra or extrahepatic biliary ductal dilatation. Gallbladder is normal in appearance.  Pancreas: No pancreatic mass. No pancreatic ductal dilatation. No pancreatic or peripancreatic fluid or  inflammatory changes.  Spleen: Unremarkable.  Adrenals/Urinary Tract: Bilateral adrenal glands are normal in appearance. 11 mm intermediate attenuation (34 HU) lesion in the medial aspect of the interpolar region of the right kidney is incompletely characterized, but favored to represent a tiny proteinaceous cyst. Sub cm low-attenuation lesion in the upper pole the left kidney is also too small to characterize, but favored to represent a cyst. 4 mm nonobstructive calculi are present within the lower pole collecting systems of the kidneys bilaterally. No ureteral stones or hydroureteronephrosis to suggest urinary tract obstruction at this time. Urinary bladder is unremarkable.  Stomach/Bowel: The thickening at the gastroesophageal junction does not appear to extend into the proximal stomach. Stomach is normal in appearance. No pathologic dilatation of small bowel or colon. Colonic diverticulosis, particularly in the sigmoid colon, without surrounding inflammatory changes to suggest an acute diverticulitis at this time.  Vascular/Lymphatic: Atherosclerosis throughout the abdominal and pelvic vasculature, without evidence of aneurysm or dissection. Multiple prominent but nonenlarged retroperitoneal lymph nodes are noted, conspicuous by their number rather than their size (nonspecific). No pathologically enlarged lymph nodes are noted in the abdomen or pelvis.  Reproductive: Prostate gland is enlarged, with severe median lobe hypertrophy. Prostate gland is heterogeneous in appearance with multifocal coarse calcifications (nonspecific). Seminal vesicles are unremarkable.  Other: No significant volume of ascites.  No pneumoperitoneum.  Musculoskeletal: There are no aggressive appearing lytic or blastic lesions noted in the visualized portions of the skeleton.  IMPRESSION: 1. Circumferential thickening of the distal esophagus just before the gastroesophageal junction. A single borderline enlarged paraesophageal lymph node  noted, which is nonspecific but concerning. In addition, there are several pulmonary nodules in the lungs bilaterally which are nonspecific. The largest of these is a mixed solid and sub solid lesion in the posterior aspect of the right upper lobe. While this may be infectious or inflammatory, the possibility of a metastatic lesion or lesions is not excluded, and close attention on followup studies is recommended. If the patient has symptoms of pulmonary infection, trial of antimicrobial therapy is recommended, followed by short-term repeat noncontrast chest CT in 2-3 weeks. If there are no such symptoms, further evaluation with PET-CT could be considered, and if this lesion in the right lower lobe is hypermetabolic, biopsy may be appropriate. 2. Tiny indeterminate renal lesions bilaterally, as above. Attention at time of follow-up imaging is recommended. 3. Atherosclerosis, including right coronary artery disease. Assessment for potential risk factor modification, dietary therapy or pharmacologic therapy may be warranted, if clinically indicated. 4. Colonic diverticulosis without findings to suggest acute diverticulitis at this time. 5. Prostatomegaly with severe median lobe hypertrophy. 6. Additional incidental findings, as above.   Electronically Signed   By: Vinnie Langton M.D.   On: 10/06/2014 15:04   Nm Pet Image Initial (  pi) Skull Base To Thigh  10/23/2014   CLINICAL DATA:  Initial treatment strategy for esophageal carcinoma.  EXAM: NUCLEAR MEDICINE PET SKULL BASE TO THIGH  TECHNIQUE: 12.5 mCi F-18 FDG was injected intravenously. Full-ring PET imaging was performed from the skull base to thigh after the radiotracer. CT data was obtained and used for attenuation correction and anatomic localization.  FASTING BLOOD GLUCOSE:  Value: 106 mg/dl  COMPARISON:  CT 10/06/2014  FINDINGS: NECK  No hypermetabolic lymph nodes in the neck.  CHEST  There is hypermetabolic thickening through the distal esophagus with SUV  max equal 8.9. Small paraesophageal lymph nodes have faint nonspecific activity (8 mm on image 107, series and 7 mm on image 113 series 4).  There is a focus of peripheral consolidation in the posterior aspect of the right lower lobe along the plueral surface which measures 14 mm and is not changed in size from 15 mm on CT of 10/06/2014. This lesion has very low metabolic activity. There is no additional hypermetabolic pulmonary nodules.  ABDOMEN/PELVIS  No abnormal hypermetabolic activity within the liver, pancreas, adrenal glands, or spleen. No hypermetabolic lymph nodes in the abdomen or pelvis.  SKELETON  No focal hypermetabolic activity to suggest skeletal metastasis.  IMPRESSION: 1. Intense metabolic activity through the distal esophagus consistent with primary carcinoma. 2. Two small paraesophageal lymph nodes are suspicious for local nodal metastasis. These have low metabolic activity. 3. No evidence of liver metastasis. 4. Peripheral nodule within the right lower lobe is not changed in size. These lesions have very low nonspecific metabolic activity. Favor a focus infection, inflammation, or infarction.   Electronically Signed   By: Suzy Bouchard M.D.   On: 10/23/2014 14:55     I have independently reviewed the above radiologic studies.  Recent Lab Findings: Lab Results  Component Value Date   WBC 2.8* 11/30/2014   HGB 14.6 11/30/2014   HCT 42.6 11/30/2014   PLT 92* 11/30/2014   GLUCOSE 94 11/26/2014   ALT 15 11/26/2014   AST 16 11/26/2014   NA 142 11/26/2014   K 4.1 11/26/2014   CL 110 08/20/2014   CREATININE 0.8 11/26/2014   BUN 19.0 11/26/2014   CO2 28 11/26/2014   INR 1.36 06/16/2009   PATH:[facility] Patient: YAGO, LUDVIGSEN A Collected: 09/28/2014 Client: [place] Accession: [id number] Diagnosis Surgical [P], distal esophagus mass - INVASIVE ADENOCARCINOMA IN A BACKGROUND OF [name]'S ESOPHAGUS.     Endoscopic findings: 1. Clearly malignant mass at the GE  juntion. The mass appears non-circumferential by endocopy, occupying about 1/2-2/3 the circumference of the distal esophagus. This is creating an incomplete GE junction stricture, lumen through the stricture is 5-45mm and so I was unable to evaluate the distal aspect of the tumor. EUS findings: 1. The mass above was actually circumferntial by EUS, maximum thickness 1.4cm. The mass clear passes into and through the muscularis propria layer of the distal esophagus wall (uT3). 2. There were three suspicious (round, well demarcated, hypoechoic) lymphnodes adjacent to the mass 9uN2). The largest lymphnode was 1cm across. ENDOSCOPIC IMPRESSION: [id number] GE junction adenocarcinoma (Stage IIIb). The mass is creating an incomplete stricture that was not able to be passed with the echoendoscope (lumen 5-44mm).   Assessment / Plan:    INVASIVE ADENOCARCINOMA OF THE DISTAL ESOPHAGUS IN A BACKGROUND OF [name]'S ESOPHAGUS,  clinical stage IIIB (uT3N2cM0)  Siew[place] I,  primarily involving the distal esophagus and GE junction- I have discussed with the patient his wife and daughter the dx  and indication for multimodality therapy including chem, radiation and surgical resection. I further discussed with the patient the surgical options and recommend that we proceed with resection 6-8 weeks following completion of his radiation. Approximately 4 weeks we'll rescan the patient's abdomen and chest and decide on timing of surgery. He's had a distant history of atrial fibrillation many years ago, currently no significant cardiac symptoms but does have a positive family history for coronary artery disease, we'll obtain cardiac clearance as soon as possible.  I  spent 45 minutes counseling the patient face to face and 50% or more the  time was spent in counseling and coordination of care. The total time spent in the appointment was 60 minutes.  Grace Isaac MD      [address] [place]  [address] Office [phone]   Beeper [phone]  12/03/2014 5:45 PM

## 2014-12-03 NOTE — Progress Notes (Signed)
Weight and vitals stable. No skin changes noted within treatment field. Reports using biafine bid as directed. Denies pain or difficulty swallowing. Reports mild fatigue.  BP 135/69 mmHg  Pulse 95  Resp 16  Wt 241 lb 1.6 oz (109.362 kg)  SpO2 100% Wt Readings from Last 3 Encounters:  12/03/14 241 lb 1.6 oz (109.362 kg)  11/27/14 238 lb 3.2 oz (108.047 kg)  11/26/14 237 lb 12.8 oz (107.865 kg)

## 2014-12-04 ENCOUNTER — Other Ambulatory Visit: Payer: Self-pay | Admitting: *Deleted

## 2014-12-04 ENCOUNTER — Ambulatory Visit
Admission: RE | Admit: 2014-12-04 | Discharge: 2014-12-04 | Disposition: A | Discharge status: Home / Self Care | Payer: Medicare Other | Source: Ambulatory Visit | Attending: Radiation Oncology | Admitting: Radiation Oncology

## 2014-12-04 DIAGNOSIS — C155 Malignant neoplasm of lower third of esophagus: Secondary | ICD-10-CM

## 2014-12-04 DIAGNOSIS — Z79899 Other long term (current) drug therapy: Secondary | ICD-10-CM | POA: Diagnosis not present

## 2014-12-04 DIAGNOSIS — Z510 Encounter for antineoplastic radiation therapy: Secondary | ICD-10-CM | POA: Diagnosis not present

## 2014-12-04 DIAGNOSIS — N400 Enlarged prostate without lower urinary tract symptoms: Secondary | ICD-10-CM | POA: Diagnosis not present

## 2014-12-04 DIAGNOSIS — M1990 Unspecified osteoarthritis, unspecified site: Secondary | ICD-10-CM | POA: Diagnosis not present

## 2014-12-04 DIAGNOSIS — Z7982 Long term (current) use of aspirin: Secondary | ICD-10-CM | POA: Diagnosis not present

## 2014-12-07 ENCOUNTER — Ambulatory Visit
Admission: RE | Admit: 2014-12-07 | Discharge: 2014-12-07 | Disposition: A | Discharge status: Home / Self Care | Payer: Medicare Other | Source: Ambulatory Visit | Attending: Radiation Oncology | Admitting: Radiation Oncology

## 2014-12-07 DIAGNOSIS — N400 Enlarged prostate without lower urinary tract symptoms: Secondary | ICD-10-CM | POA: Diagnosis not present

## 2014-12-07 DIAGNOSIS — M1990 Unspecified osteoarthritis, unspecified site: Secondary | ICD-10-CM | POA: Diagnosis not present

## 2014-12-07 DIAGNOSIS — Z7982 Long term (current) use of aspirin: Secondary | ICD-10-CM | POA: Diagnosis not present

## 2014-12-07 DIAGNOSIS — C155 Malignant neoplasm of lower third of esophagus: Secondary | ICD-10-CM | POA: Diagnosis not present

## 2014-12-07 DIAGNOSIS — Z510 Encounter for antineoplastic radiation therapy: Secondary | ICD-10-CM | POA: Diagnosis not present

## 2014-12-07 DIAGNOSIS — Z79899 Other long term (current) drug therapy: Secondary | ICD-10-CM | POA: Diagnosis not present

## 2014-12-08 ENCOUNTER — Ambulatory Visit
Admission: RE | Admit: 2014-12-08 | Discharge: 2014-12-08 | Disposition: A | Discharge status: Home / Self Care | Payer: Medicare Other | Source: Ambulatory Visit | Attending: Radiation Oncology | Admitting: Radiation Oncology

## 2014-12-08 DIAGNOSIS — Z7982 Long term (current) use of aspirin: Secondary | ICD-10-CM | POA: Diagnosis not present

## 2014-12-08 DIAGNOSIS — C155 Malignant neoplasm of lower third of esophagus: Secondary | ICD-10-CM | POA: Diagnosis not present

## 2014-12-08 DIAGNOSIS — Z79899 Other long term (current) drug therapy: Secondary | ICD-10-CM | POA: Diagnosis not present

## 2014-12-08 DIAGNOSIS — N400 Enlarged prostate without lower urinary tract symptoms: Secondary | ICD-10-CM | POA: Diagnosis not present

## 2014-12-08 DIAGNOSIS — Z510 Encounter for antineoplastic radiation therapy: Secondary | ICD-10-CM | POA: Diagnosis not present

## 2014-12-08 DIAGNOSIS — M1990 Unspecified osteoarthritis, unspecified site: Secondary | ICD-10-CM | POA: Diagnosis not present

## 2014-12-09 ENCOUNTER — Ambulatory Visit (HOSPITAL_BASED_OUTPATIENT_CLINIC_OR_DEPARTMENT_OTHER): Payer: Medicare Other

## 2014-12-09 ENCOUNTER — Encounter: Payer: Self-pay | Admitting: Radiation Oncology

## 2014-12-09 ENCOUNTER — Ambulatory Visit
Admission: RE | Admit: 2014-12-09 | Discharge: 2014-12-09 | Disposition: A | Discharge status: Home / Self Care | Payer: Medicare Other | Source: Ambulatory Visit | Attending: Radiation Oncology | Admitting: Radiation Oncology

## 2014-12-09 VITALS — BP 131/68 | HR 101 | Resp 16 | Wt 238.8 lb

## 2014-12-09 DIAGNOSIS — Z510 Encounter for antineoplastic radiation therapy: Secondary | ICD-10-CM | POA: Diagnosis not present

## 2014-12-09 DIAGNOSIS — C155 Malignant neoplasm of lower third of esophagus: Secondary | ICD-10-CM | POA: Diagnosis present

## 2014-12-09 DIAGNOSIS — Z79899 Other long term (current) drug therapy: Secondary | ICD-10-CM | POA: Diagnosis not present

## 2014-12-09 DIAGNOSIS — Z7982 Long term (current) use of aspirin: Secondary | ICD-10-CM | POA: Diagnosis not present

## 2014-12-09 DIAGNOSIS — M1990 Unspecified osteoarthritis, unspecified site: Secondary | ICD-10-CM | POA: Diagnosis not present

## 2014-12-09 DIAGNOSIS — N400 Enlarged prostate without lower urinary tract symptoms: Secondary | ICD-10-CM | POA: Diagnosis not present

## 2014-12-09 LAB — CBC WITH DIFFERENTIAL/PLATELET
BASO%: 0 % (ref 0.0–2.0)
Basophils Absolute: 0 10*3/uL (ref 0.0–0.1)
EOS%: 0.4 % (ref 0.0–7.0)
Eosinophils Absolute: 0 10*3/uL (ref 0.0–0.5)
HEMATOCRIT: 41.8 % (ref 38.4–49.9)
HGB: 14.4 g/dL (ref 13.0–17.1)
LYMPH%: 11.6 % — AB (ref 14.0–49.0)
MCH: 32.5 pg (ref 27.2–33.4)
MCHC: 34.4 g/dL (ref 32.0–36.0)
MCV: 94.4 fL (ref 79.3–98.0)
MONO#: 0.3 10*3/uL (ref 0.1–0.9)
MONO%: 13.3 % (ref 0.0–14.0)
NEUT#: 1.7 10*3/uL (ref 1.5–6.5)
NEUT%: 74.7 % (ref 39.0–75.0)
Platelets: 93 10*3/uL — ABNORMAL LOW (ref 140–400)
RBC: 4.43 10*6/uL (ref 4.20–5.82)
RDW: 13.9 % (ref 11.0–14.6)
WBC: 2.3 10*3/uL — AB (ref 4.0–10.3)
lymph#: 0.3 10*3/uL — ABNORMAL LOW (ref 0.9–3.3)
nRBC: 0 % (ref 0–0)

## 2014-12-09 NOTE — Progress Notes (Signed)
  Department of Radiation Oncology  Phone:  [phone] Fax:        [phone]  Weekly Treatment Note    Name: GUILLERMO NEHRING Date: 12/09/2014 MRN: [id number] DOB: 14-Jun-1940   Current dose: 50.4 Gy  Current fraction: 20   MEDICATIONS: Current Outpatient Prescriptions  Medication Sig Dispense Refill  . ALPRAZolam (XANAX) 0.5 MG tablet Take 0.5 mg by mouth 2 (two) times daily as needed for anxiety.   4  . amLODipine (NORVASC) 5 MG tablet Take 5 mg by mouth daily.  0  . aspirin 81 MG tablet Take 81 mg by mouth daily.    . benazepril (LOTENSIN) 40 MG tablet Take 40 mg by mouth daily.    Marland Kitchen emollient (BIAFINE) cream Apply topically as needed. 454 g 0  . furosemide (LASIX) 40 MG tablet Take 40 mg by mouth daily.  3  . Liniments (DEEP BLUE RELIEF) GEL Apply 1 application topically daily as needed (FOR PAIN).    Marland Kitchen pantoprazole (PROTONIX) 40 MG tablet Take 1 tablet (40 mg total) by mouth daily. 90 tablet 3  . prochlorperazine (COMPAZINE) 10 MG tablet Take 1 tablet (10 mg total) by mouth every 6 (six) hours as needed for nausea or vomiting. 20 tablet 1  . Tamsulosin HCl (FLOMAX) 0.4 MG CAPS Take 0.4 mg by mouth daily after supper.     No current facility-administered medications for this encounter.     ALLERGIES: Nexium; Other; and Statins   LABORATORY DATA:  Lab Results  Component Value Date   WBC 2.3* 12/09/2014   HGB 14.4 12/09/2014   HCT 41.8 12/09/2014   MCV 94.4 12/09/2014   PLT 93* 12/09/2014   Lab Results  Component Value Date   NA 142 11/26/2014   K 4.1 11/26/2014   CL 110 08/20/2014   CO2 28 11/26/2014   Lab Results  Component Value Date   ALT 15 11/26/2014   AST 16 11/26/2014   ALKPHOS 60 11/26/2014   BILITOT 0.93 11/26/2014     NARRATIVE: [place] Nickel was seen today for weekly treatment management. The chart was checked and the patient's films were reviewed. Reports using biafine bid as directed. Denies pain. Reports he only has difficulty swallowing  rice. Reports mild manageable fatigue. Scheduled to follow up with a heart specialist in the coming weeks to ensure he is heart healthy for surgery. Also, Dr. Servando Snare has scheduled a CT scan for three weeks from now with surgery planned for 8 weeks out. He is scheduled to see Dr. Servando Snare on 12/31/14. One month follow up appointment card given. Patient understands to contact this RN with future needs.  PHYSICAL EXAMINATION: weight is 238 lb 12.8 oz (108.319 kg). His blood pressure is 131/68 and his pulse is 101. His respiration is 16 and oxygen saturation is 100%.  Alert and oriented times three. In no distress.  ASSESSMENT: The patient did satisfactorily with treatment.  PLAN: The patient will follow-up in our clinic in 1 month. He can call if he has any questions or concerns.

## 2014-12-09 NOTE — Progress Notes (Signed)
Weight and vitals stable. No skin changes noted within treatment field. Reports using biafine bid as directed. Denies pain. Reports he only has difficulty swallowing rice. Reports mild manageable fatigue. Scheduled to follow up with a heart specialist in the coming weeks to ensure he is heart healthy for surgery. Also, Dr. Roxy Horseman has scheduled a CT scan for three weeks from now with surgery planned for 8 weeks out. One month follow up appointment card given. Patient understands to contact this RN with future needs.   BP 131/68 mmHg  Pulse 101  Resp 16  Wt 238 lb 12.8 oz (108.319 kg)  SpO2 100% Wt Readings from Last 3 Encounters:  12/09/14 238 lb 12.8 oz (108.319 kg)  12/03/14 241 lb (109.317 kg)  12/03/14 241 lb 1.6 oz (109.362 kg)

## 2014-12-11 ENCOUNTER — Ambulatory Visit (INDEPENDENT_AMBULATORY_CARE_PROVIDER_SITE_OTHER): Payer: Medicare Other | Admitting: Cardiology

## 2014-12-11 ENCOUNTER — Encounter: Payer: Self-pay | Admitting: Cardiology

## 2014-12-11 VITALS — BP 152/62 | HR 96 | Ht 76.0 in | Wt 239.0 lb

## 2014-12-11 DIAGNOSIS — Z01810 Encounter for preprocedural cardiovascular examination: Secondary | ICD-10-CM | POA: Diagnosis not present

## 2014-12-11 DIAGNOSIS — I10 Essential (primary) hypertension: Secondary | ICD-10-CM

## 2014-12-11 NOTE — Patient Instructions (Signed)
Your physician recommends that you continue on your current medications as directed. Please refer to the Current Medication list given to you today.    Your physician recommends that you schedule a follow-up appointment in: AS NEEDED WITH DR NELSON  

## 2014-12-11 NOTE — Progress Notes (Signed)
Patient ID: David [name], male   DOB: 12-10-1940, 74 y.o.   MRN: [id number]      Cardiology Office Note  Date:  12/11/2014   ID:  David [name], DOB 05/30/1940, MRN [id number]  PCP:  Marton Redwood, MD  Cardiologist:  Dorothy Spark, MD   Chief complain: Preoperative evaluation  History of Present Illness: David [name] is a 74 y.o. male who presents for preoperative cardiovascular evaluation. The patient was seen in the past by Dr Tamala Julian for episodes of palpitations, work up was negative for significant arrhythmia. No h/o angina, CHF or valvular disease. No FH of premature CAD. He was recently diagnosed with an esophageal cancer. He is undergoing neo adjuvant chemotherapy and radiation and is scheduled for esophagectomy with Dr Servando Snare.  He is very active, walks 2 miles daily and denies any angina, DOE, palpitations, claudications, dizziness or syncope. He also denies orthopnea or PND. He has on and off LE edem afor which he chronically takes lasix.    Past Medical History  Diagnosis Date  . Hypertension   . Anxiety   . Arthritis   . Basal cell carcinoma     nose, face, back  . Food impaction of esophagus 08/20/2014  . Esophageal cancer   . Enlarged prostate   . Allergy     Past Surgical History  Procedure Laterality Date  . Total knee arthroplasty  2010    right  . Cervical laminectomy  1980  . Appendectomy  1961  . Colonoscopy    . Esophagogastroduodenoscopy [name] 08/20/2014    Procedure: ESOPHAGOGASTRODUODENOSCOPY (EGD);  Surgeon: Gatha Mayer, MD;  Location: [facility] ENDOSCOPY;  Service: Endoscopy;  Laterality: [name];  . Eus [name] 10/15/2014    Procedure: UPPER ENDOSCOPIC ULTRASOUND (EUS) LINEAR;  Surgeon: Milus Banister, MD;  Location: [facility] ENDOSCOPY;  Service: Endoscopy;  Laterality: [name];  radial linear     Current Outpatient Prescriptions  Medication Sig Dispense Refill  . ALPRAZolam (XANAX) 0.5 MG tablet Take 0.5 mg by mouth 2 (two) times daily as needed for anxiety.   4  .  amLODipine (NORVASC) 5 MG tablet Take 5 mg by mouth daily.  0  . aspirin 81 MG tablet Take 81 mg by mouth daily.    . benazepril (LOTENSIN) 40 MG tablet Take 40 mg by mouth daily.    Marland Kitchen emollient (BIAFINE) cream Apply topically as needed. 454 g 0  . furosemide (LASIX) 40 MG tablet Take 40 mg by mouth daily.  3  . Liniments (DEEP BLUE RELIEF) GEL Apply 1 application topically daily as needed (FOR PAIN).    Marland Kitchen pantoprazole (PROTONIX) 40 MG tablet Take 1 tablet (40 mg total) by mouth daily. 90 tablet 3  . prochlorperazine (COMPAZINE) 10 MG tablet Take 1 tablet (10 mg total) by mouth every 6 (six) hours as needed for nausea or vomiting. 20 tablet 1  . Tamsulosin HCl (FLOMAX) 0.4 MG CAPS Take 0.4 mg by mouth daily after supper.     No current facility-administered medications for this visit.    Allergies:   Nexium; Other; and Statins    Social History:  The patient  reports that he quit smoking about 53 years ago. He has never used smokeless tobacco. He reports that he does not drink alcohol or use illicit drugs.   Family History:  The patient's family history includes Cancer in his maternal uncle, paternal grandmother, and paternal uncle; Lymphoma in his mother. There is no history of Stomach cancer or  Colon cancer.    ROS:  Please see the history of present illness.   Otherwise, review of systems are positive for none.   All other systems are reviewed and negative.    PHYSICAL EXAM: VS:  BP 152/62 mmHg  Pulse 96  Ht 6\' 4"  (1.93 m)  Wt 239 lb (108.41 kg)  BMI 29.10 kg/m2  SpO2 96% , BMI Body mass index is 29.1 kg/(m^2). GEN: Well nourished, well developed, in no acute distress HEENT: normal Neck: no JVD, carotid bruits, or masses Cardiac: RRR; no murmurs, rubs, or gallops, trivial peri malleolar edema  Respiratory:  clear to auscultation bilaterally, normal work of breathing GI: soft, nontender, nondistended, + BS MS: no deformity or atrophy Skin: warm and dry, no rash Neuro:   Strength and sensation are intact Psych: euthymic mood, full affect  EKG:  EKG is ordered today. The ekg ordered today demonstrates ST, normal ECG, possible LAE  Recent Labs: 11/26/2014: ALT 15; BUN 19.0; Creatinine 0.8; Potassium 4.1; Sodium 142 12/09/2014: HGB 14.4; Platelets 93*   Lipid Panel No results found for: CHOL, TRIG, HDL, CHOLHDL, VLDL, LDLCALC, LDLDIRECT   Wt Readings from Last 3 Encounters:  12/11/14 239 lb (108.41 kg)  12/09/14 238 lb 12.8 oz (108.319 kg)  12/03/14 241 lb (109.317 kg)      ASSESSMENT AND PLAN:  1.  Preoperative evaluation for esophageal surgery - the patient is asymptomatic and has no signs of angina, CHF or valvular disease. He remains active and can certainly achieve at least 4 METS. He ECG is normal and suggestive of ischemia.  He doesn't require ischemic work up prior to the surgery as he is considered a low risk for a moderate risk surgery.  His recheck BP was 130/58 mmHg - continue the same regimen.  Please call us with any questions.   Signed, Dorothy Spark, MD  12/11/2014 5:30 PM    [place], [place], [place]  [address] Phone: [phone]; Fax: [phone]

## 2014-12-18 ENCOUNTER — Encounter: Payer: Self-pay | Admitting: Radiation Oncology

## 2014-12-20 NOTE — Progress Notes (Signed)
  Radiation Oncology         [phone] ________________________________  Name: David [name] MRN: [id number]  Date: 12/09/2014  DOB: March 19, 1941    End of Treatment Note    ICD-9-CM ICD-10-CM   1. Cancer of lower third of esophagus 150.5 C15.5     DIAGNOSIS: 74 yo gentleman with clinical uT3uN2M0 adenocarcinoma of the distal esophagus     Indication for treatment:  Curative Pre-Op Chemoradiotherapy       Radiation treatment dates:   11/02/2014-12/09/2014  Site/dose:   The involved segment of the esophagus and regional nodal basin was treated to 50.4 Gy in 28 fractions  Beams/energy:   IMRT was required to spare the heart, lungs and spinal cord with adequate target coverage. The patient was treated with IMRT using volumetric arc therapy delivering 6 MV X-rays to clockwise and counterclockwise circumferential arcs with a 90 degree collimator offset to avoid dose scalloping.  Image guidance was performed with daily cone beam CT prior to each fraction to align to his anatomy.  Immobilization was achieved with [employer] custom mold.  Narrative: The patient tolerated radiation treatment relatively well.   He managed to gain weight during treatment and even posed for press pictures with me for the [employer].   Plan: The patient has completed radiation treatment. The patient will return to radiation oncology clinic for routine followup in one month. I advised him to call or return sooner if he has any questions or concerns related to his recovery or treatment. ________________________________  Sheral Apley. Tammi Klippel, M.D.

## 2014-12-22 ENCOUNTER — Telehealth: Payer: Self-pay | Admitting: *Deleted

## 2014-12-22 NOTE — Telephone Encounter (Signed)
Called pt with new appointment for 8/31. He agrees to come in for lab 0930/ 10AM with Dr. Benay Spice. Order sent to schedulers.

## 2014-12-23 ENCOUNTER — Ambulatory Visit (HOSPITAL_BASED_OUTPATIENT_CLINIC_OR_DEPARTMENT_OTHER): Payer: Medicare Other | Admitting: Oncology

## 2014-12-23 ENCOUNTER — Telehealth: Payer: Self-pay | Admitting: Oncology

## 2014-12-23 ENCOUNTER — Ambulatory Visit (HOSPITAL_BASED_OUTPATIENT_CLINIC_OR_DEPARTMENT_OTHER): Payer: Medicare Other

## 2014-12-23 VITALS — BP 145/68 | HR 91 | Temp 98.1°F | Resp 18 | Ht 76.0 in | Wt 241.9 lb

## 2014-12-23 DIAGNOSIS — I10 Essential (primary) hypertension: Secondary | ICD-10-CM | POA: Diagnosis not present

## 2014-12-23 DIAGNOSIS — C155 Malignant neoplasm of lower third of esophagus: Secondary | ICD-10-CM

## 2014-12-23 DIAGNOSIS — D701 Agranulocytosis secondary to cancer chemotherapy: Secondary | ICD-10-CM

## 2014-12-23 LAB — CBC WITH DIFFERENTIAL/PLATELET
BASO%: 0.6 % (ref 0.0–2.0)
Basophils Absolute: 0 10*3/uL (ref 0.0–0.1)
EOS ABS: 0.1 10*3/uL (ref 0.0–0.5)
EOS%: 2 % (ref 0.0–7.0)
HEMATOCRIT: 43.1 % (ref 38.4–49.9)
HEMOGLOBIN: 14.6 g/dL (ref 13.0–17.1)
LYMPH%: 10.1 % — AB (ref 14.0–49.0)
MCH: 32.6 pg (ref 27.2–33.4)
MCHC: 33.8 g/dL (ref 32.0–36.0)
MCV: 96.7 fL (ref 79.3–98.0)
MONO#: 0.6 10*3/uL (ref 0.1–0.9)
MONO%: 20 % — ABNORMAL HIGH (ref 0.0–14.0)
NEUT%: 67.3 % (ref 39.0–75.0)
NEUTROS ABS: 2 10*3/uL (ref 1.5–6.5)
PLATELETS: 116 10*3/uL — AB (ref 140–400)
RBC: 4.46 10*6/uL (ref 4.20–5.82)
RDW: 16.2 % — AB (ref 11.0–14.6)
WBC: 2.9 10*3/uL — ABNORMAL LOW (ref 4.0–10.3)
lymph#: 0.3 10*3/uL — ABNORMAL LOW (ref 0.9–3.3)

## 2014-12-23 NOTE — Telephone Encounter (Signed)
per pof to sch pt appt-gave pt copy of avs °

## 2014-12-23 NOTE — Progress Notes (Signed)
  [facility] OFFICE PROGRESS NOTE   Diagnosis: Esophagus cancer  INTERVAL HISTORY:   [name] returns for scheduled visit. He completed radiation 12/09/2014. He reports intermittent dysphagia relieved with "sugar ". He feels well. Good energy level. He is working. He is scheduled to see Dr. Servando Snare with restaging CTs next week.  Objective:  Vital signs in last 24 hours:  Blood pressure 145/68, pulse 91, temperature 98.1 F (36.7 C), temperature source Oral, resp. rate 18, height 6\' 4"  (1.93 m), weight 241 lb 14.4 oz (109.725 kg), SpO2 99 %.    HEENT: Neck without mass Lymphatics: No cervical, supra-clavicular, axillary, or inguinal nodes Resp: Lungs clear bilaterally Cardio: Regular rate and rhythm GI: No hepatomegaly, nontender, no mass Vascular: No leg edema   Lab Results:  Lab Results  Component Value Date   WBC 2.9* 12/23/2014   HGB 14.6 12/23/2014   HCT 43.1 12/23/2014   MCV 96.7 12/23/2014   PLT 116* 12/23/2014   NEUTROABS 2.0 12/23/2014    Medications: I have reviewed the patient's current medications.  Assessment/Plan: 1. Adenocarcinoma of the distal esophagus,uT3uN  Presenting with a food impaction and dysphagia 08/20/2014  Staging CT scans of the chest, abdomen, and pelvis 10/06/2014 with indeterminate pulmonary nodules, a 9 mm paraesophageal lymph node, and circumferential thickening of the distal esophagus  PET scan 10/23/2014 confirmed hypermetabolic thickening at the distal esophagus with small paraesophageal nodes having faint nonspecific activity. A pleural-based focus of consolidation at the posterior right lower lobe had low metabolic activity-favored to be a benign process.  Initiation of concurrent weekly Taxol/carboplatin and radiation on 11/02/2014, radiation completed 12/09/2014  2. Prostatic hypertrophy  3. Hypertension  4. History of multiple basal cell skin cancers  5.   Thrombocytopenia secondary to  chemotherapy-improved    Disposition:  Mr. Paar has completed combined treatment with Taxol/carboplatin and radiation. He tolerated the treatment well. He is scheduled to see Dr Servando Snare next week after he completes restaging CTs. He will proceed with surgery as indicated after being evaluated by Dr. Servando Snare.  He will return for an office visit here in approximately 2 months.  Betsy Coder, MD  12/23/2014  10:38 AM

## 2014-12-30 ENCOUNTER — Telehealth: Payer: Self-pay | Admitting: Oncology

## 2014-12-30 ENCOUNTER — Ambulatory Visit: Payer: Medicare Other | Admitting: Oncology

## 2014-12-30 ENCOUNTER — Other Ambulatory Visit: Payer: Self-pay | Admitting: *Deleted

## 2014-12-30 ENCOUNTER — Other Ambulatory Visit (HOSPITAL_BASED_OUTPATIENT_CLINIC_OR_DEPARTMENT_OTHER): Payer: Medicare Other

## 2014-12-30 DIAGNOSIS — C155 Malignant neoplasm of lower third of esophagus: Secondary | ICD-10-CM

## 2014-12-30 LAB — CBC WITH DIFFERENTIAL/PLATELET
BASO%: 0.8 % (ref 0.0–2.0)
Basophils Absolute: 0 10*3/uL (ref 0.0–0.1)
EOS%: 2.6 % (ref 0.0–7.0)
Eosinophils Absolute: 0.1 10*3/uL (ref 0.0–0.5)
HCT: 43.9 % (ref 38.4–49.9)
HGB: 14.9 g/dL (ref 13.0–17.1)
LYMPH%: 10.9 % — AB (ref 14.0–49.0)
MCH: 32.8 pg (ref 27.2–33.4)
MCHC: 34.1 g/dL (ref 32.0–36.0)
MCV: 96.3 fL (ref 79.3–98.0)
MONO#: 0.7 10*3/uL (ref 0.1–0.9)
MONO%: 18.5 % — ABNORMAL HIGH (ref 0.0–14.0)
NEUT%: 67.2 % (ref 39.0–75.0)
NEUTROS ABS: 2.5 10*3/uL (ref 1.5–6.5)
Platelets: 143 10*3/uL (ref 140–400)
RBC: 4.55 10*6/uL (ref 4.20–5.82)
RDW: 17 % — ABNORMAL HIGH (ref 11.0–14.6)
WBC: 3.8 10*3/uL — AB (ref 4.0–10.3)
lymph#: 0.4 10*3/uL — ABNORMAL LOW (ref 0.9–3.3)

## 2014-12-30 NOTE — Telephone Encounter (Signed)
Patient came in as he had labs and a visit today.  This visit was old and should have been deleted.  I did check with David [name] and she is aware.  Patient does have a new appointment 10.24

## 2014-12-31 ENCOUNTER — Ambulatory Visit (INDEPENDENT_AMBULATORY_CARE_PROVIDER_SITE_OTHER): Payer: Medicare Other | Admitting: Cardiothoracic Surgery

## 2014-12-31 ENCOUNTER — Encounter: Payer: Self-pay | Admitting: Cardiothoracic Surgery

## 2014-12-31 ENCOUNTER — Other Ambulatory Visit: Payer: Medicare Other

## 2014-12-31 ENCOUNTER — Ambulatory Visit
Admission: RE | Admit: 2014-12-31 | Discharge: 2014-12-31 | Disposition: A | Discharge status: Home / Self Care | Payer: Medicare Other | Source: Ambulatory Visit | Attending: Cardiothoracic Surgery | Admitting: Cardiothoracic Surgery

## 2014-12-31 VITALS — BP 137/76 | HR 96 | Resp 20 | Ht 76.0 in | Wt 240.0 lb

## 2014-12-31 DIAGNOSIS — C160 Malignant neoplasm of cardia: Secondary | ICD-10-CM | POA: Diagnosis not present

## 2014-12-31 DIAGNOSIS — C155 Malignant neoplasm of lower third of esophagus: Secondary | ICD-10-CM

## 2014-12-31 MED ORDER — IOPAMIDOL (ISOVUE-300) INJECTION 61%
125.0000 mL | Freq: Once | INTRAVENOUS | Status: AC | PRN
  Administered 2014-12-31: 125 mL via INTRAVENOUS

## 2014-12-31 NOTE — Progress Notes (Signed)
[address]       [place] [address]             [id number]                    David [name] [facility] Medical Record #[id number] Date of Birth: 1941-02-21  Referring: Dr Benay Spice Primary Care: Marton Redwood, MD  Chief Complaint:    Chief Complaint  Patient presents with  . Esophageal Cancer    Further discuss surgery, Chest/ABD CT, and Cardiac Clearance 12/11/14- Dr.Nelson      History of Present Illness:    David [name] 74 y.o. male is seen in the office  today for follow up treatment for esophageal cancer. Patient noted  February 2016 some difficulty in swallowing and painful swallowing. This acutely became a problem with food obstruction requiring endoscopy. Endoscopy confirmed a distal esophageal mass at the GE junction. Subsequent biopsy confirmed adenocarcinoma. CT scan of the chest and abdomen , PET scan  and esophageal ultrasound  has been performed  Patient has been able to maintain his weight and nutrition. He started on Taxol carboplatinum completing his course of preoperative chemotherapy on 11/30/2014 he will complete radiation therapy on August 17.  He has tolerated the radiation chemotherapy very well with minimal side effects. Notes that he's been able to maintain his usual level of activity. His swallowing has improved  Current Activity/ Functional Status:  Patient is independent with mobility/ambulation, transfers, ADL's, IADL's.   Zubrod Score: At the time of surgery this patient's most appropriate activity status/level should be described as: [x]     0    Normal activity, no symptoms []     1    Restricted in physical strenuous activity but ambulatory, able to do out light work []     2    Ambulatory and capable of self care, unable to do work activities, up and about               >50 % of waking hours                              []     3    Only limited self care, in bed greater than 50% of waking hours []     4    Completely disabled,  no self care, confined to bed or chair []     5    Moribund   Past Medical History  Diagnosis Date  . Hypertension   . Anxiety   . Arthritis   . Basal cell carcinoma     nose, face, back  . Food impaction of esophagus 08/20/2014  . Esophageal cancer   . Enlarged prostate   . Allergy     Past Surgical History  Procedure Laterality Date  . Total knee arthroplasty  2010    right  . Cervical laminectomy  1980  . Appendectomy  1961  . Colonoscopy    . Esophagogastroduodenoscopy [name] 08/20/2014    Procedure: ESOPHAGOGASTRODUODENOSCOPY (EGD);  Surgeon: Gatha Mayer, MD;  Location: [facility] ENDOSCOPY;  Service: Endoscopy;  Laterality: [name];  . Eus [name] 10/15/2014    Procedure: UPPER ENDOSCOPIC ULTRASOUND (EUS) LINEAR;  Surgeon: Milus Banister, MD;  Location: [facility] ENDOSCOPY;  Service: Endoscopy;  Laterality: [name];  radial linear    Family History  Problem Relation Age of Onset  . Stomach cancer Neg Hx   .  Colon cancer Neg Hx   . Lymphoma Mother   . Cancer Maternal Uncle   . Cancer Paternal Uncle   . Cancer Paternal Grandmother    Patient had one son who died at age 81 of adenocarcinoma the lung was a nonsmoker, presented with brain metastases and lived approximate 68 months post diagnosis. His father had bypass surgery developed a blood dyscrasia and died at age 55, his mother had lymphoma and died at age 53.   History   Social History  . Marital Status: Married    Spouse Name: [name]  . Number of Children: [name]  . Years of Education: [name]   Occupational History  .  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 Smoking status: Former Smoker    Quit date: 06/07/1961  . Smokeless tobacco: Never Used  . Alcohol Use: No  . Drug Use: No  . Sexual Activity: Not Currently            History  Smoking status  . Former Smoker  . Quit date: 06/07/1961  Smokeless tobacco  . Never Used    History  Alcohol Use No     Allergies  Allergen Reactions    . Nexium [Esomeprazole Magnesium] Other (See Comments)    Difficulty urinating and passing stool; dry mouth  . Other     Thinks it was oxycodone; made him hallucinate  . Statins Other (See Comments)    Leg cramping    Current Outpatient Prescriptions  Medication Sig Dispense Refill  . ALPRAZolam (XANAX) 0.5 MG tablet Take 0.5 mg by mouth 2 (two) times daily as needed for anxiety.   4  . amLODipine (NORVASC) 5 MG tablet Take 5 mg by mouth daily.  0  . aspirin 81 MG tablet Take 81 mg by mouth daily.    . benazepril (LOTENSIN) 40 MG tablet Take 40 mg by mouth daily.    . furosemide (LASIX) 40 MG tablet Take 40 mg by mouth daily.  3  . Liniments (DEEP BLUE RELIEF) GEL Apply 1 application topically daily as needed (FOR PAIN).    Marland Kitchen pantoprazole (PROTONIX) 40 MG tablet Take 1 tablet (40 mg total) by mouth daily. 90 tablet 3  . prochlorperazine (COMPAZINE) 10 MG tablet Take 1 tablet (10 mg total) by mouth every 6 (six) hours as needed for nausea or vomiting. 20 tablet 1  . Tamsulosin HCl (FLOMAX) 0.4 MG CAPS Take 0.4 mg by mouth daily after supper.     No current facility-administered medications for this visit.      Review of Systems:     Cardiac Review of Systems: Y or N  Chest Pain [   n ]  Resting SOB [  n ] Exertional SOB  [n  ]  Orthopnea [ n ]   Pedal Edema [n   ]    Palpitations [ 25 years ago ] Syncope  [ n ]   Presyncope [n   ]  General Review of Systems: [Y] = yes [  ]=no Constitional: recent weight change [  ];  Wt loss over the last 3 months [  260 to 236 had been trying to loose weight ] anorexia [  ]; fatigue [  ]; nausea [  ]; night sweats [  ]; fever [  ]; or chills [  ];          Dental: poor dentition[  ]; Last Dentist visit:  Eye : blurred vision [  ]; diplopia [   ]; vision changes [ y ];  Amaurosis fugax[  ]; Resp: cough [  ];  wheezing[  ];  hemoptysis[  ]; shortness of breath[  ]; paroxysmal nocturnal dyspnea[  ]; dyspnea on exertion[  ]; or orthopnea[  ];  GI:   gallstones[  ], vomiting[  ];  dysphagia[  ]; melena[  ];  hematochezia [  ]; heartburn[  ];   Hx of  Colonoscopy[y  ]; GU: kidney stones [ n ]; hematuria[ n ];   dysuria [  ];  nocturia[y  ];  history of     obstruction [  n]; urinary frequency Blue.Reese  ]             Skin: rash, swelling[  ];, hair loss[  ];  peripheral edema[  ];  or itching[  ]; Musculosketetal: myalgias[y  ];  joint swelling[  ];  joint erythema[  ];  joint pain[  ];  back pain[  ];  Heme/Lymph: bruising[  ];  bleeding[  ];  anemia[  ];  Neuro: TIA[n  ];  headaches[ n ];  stroke[n  ];  vertigo[  ];  seizures[n  ];   paresthesias[ n ];  difficulty walking[n  ];  Psych:depression[  ]; anxiety[  ];  Endocrine: diabetes[  ];  thyroid dysfunction[  ];  Immunizations: Flu up to date Florencio.Farrier  ]; Pneumococcal up to date [ y ];  Other:  Physical Exam: BP 137/76 mmHg  Pulse 96  Resp 20  Ht 6\' 4"  (1.93 m)  Wt 240 lb (108.863 kg)  BMI 29.23 kg/m2  SpO2 97%  PHYSICAL EXAMINATION: General appearance: alert and cooperative Head: Normocephalic, without obvious abnormality, atraumatic Neck: no adenopathy, no carotid bruit, no JVD, supple, symmetrical, trachea midline and thyroid not enlarged, symmetric, no tenderness/mass/nodules Lymph nodes: Cervical, supraclavicular, and axillary nodes normal. Resp: clear to auscultation bilaterally Back: symmetric, no curvature. ROM normal. No CVA tenderness. Cardio: regular rate and rhythm, S1, S2 normal, no murmur, click, rub or gallop GI: soft, non-tender; bowel sounds normal; no masses,  no organomegaly Extremities: extremities normal, atraumatic, no cyanosis or edema and Homans sign is negative, no sign of DVT Neurologic: Grossly normal Patient has no carotid bruits, palpable DP and PT pulses bilaterally  Diagnostic Studies & Laboratory data:     Recent Radiology Findings: Ct Chest Abdomen W Contrast  12/31/2014   CLINICAL DATA:  Carcinoma of the lower third of the esophagus. Preoperative  assessment.  EXAM: CT CHEST AND ABDOMEN WITHOUT CONTRAST  TECHNIQUE: Multidetector CT imaging of the chest and abdomen was performed following the standard protocol without intravenous contrast.  COMPARISON:  10/23/2014  FINDINGS: CT CHEST FINDINGS  Mediastinum/Nodes: Left supraclavicular lymph node 7 mm in short axis on image 11 series 3, previously present on the PET-CT and not visibly hypermetabolic.  Aortic arch atherosclerosis.  Calcified mitral valve.  Distal esophageal wall thickening just above the hiatus. Adjacent lymph nodes include a 9 mm short axis node on image 53 series 3 and a 6 mm node on image 55 series 3, concerning due to their proximity to the mass. 5 mm short axis lymph node anterior to the aorta along image 49 series 3.  Lungs/Pleura: 5 mm in width nodule in the right lower lobe medially on image 57 of series 7. This is in a location rib pre but frequently corresponding to atelectasis, but is persistent from 10/06/2014.  Scarring or subsegmental  atelectasis in the posterior basal segment right lower lobe. Pleural-based nodule on the right side measures 8 mm in thickness and has a faint surrounding halo of ground-glass opacity. This is similar to the prior exam and adjacent to some mild irregularity of the right posterior eighth and ninth ribs probably representing old rib fractures.  0.7 by 0.5 cm pulmonary nodule posterior to the right bronchus intermedius on image 34 series 7, stable but more sharply defined on today's exam due to slice selection.  Musculoskeletal: Lower cervical and thoracic spondylosis. Old right posterior rib deformities adjacent to the pleural thickening as noted above.  CT ABDOMEN FINDINGS  Hepatobiliary: Unremarkable  Pancreas: Unremarkable  Spleen: Unremarkable  Adrenals/Urinary Tract: Hypodense 8 mm medial right mid kidney lesion is technically too small to characterize.  6 mm in long axis right UPJ stone with surrounding thickening and enhancement of the collecting  system, and borderline hydronephrosis. 2 mm left kidney lower pole nonobstructive calculus. 6 mm hypodense lesion of the left kidney upper pole, likely a cyst but technically too small to characterize.  Stomach/Bowel: Unremarkable  Vascular/Lymphatic: No pathologic gastrohepatic ligament or porta hepatis adenopathy. Aortoiliac atherosclerotic vascular disease.  Other: No supplemental non-categorized findings.  Musculoskeletal: Lumbar spondylosis and degenerative disc disease. Large Schmorl's node along the inferior endplate of L4 with surrounding sclerosis likely from degenerative endplate findings. Similar Schmorl's node along the superior endplate of L5. Transitional S1 vertebra. Spondylosis and degenerative disc disease cause multilevel impingement in the lumbar spine.  IMPRESSION: 1. Abnormal distal esophageal wall thickening is present compatible with esophageal cancer. 2 adjacent lymph nodes are probably involved. 2. Several right lung nodules if in the 5-6 mm range merit observation. These were present but not hypermetabolic on prior PET-CT, but below sensitive PET-CT size thresholds. 3. Pleural-based nodularity measuring 8 mm in thickness along the right posterior hemithorax was not previously hypermetabolic, and is adjacent to some old rib fractures -this probably is a residua from the prior rib fractures but likewise merit some observation. 4. 6 mm in long axis right UPJ stone with surrounding mucosal thickening/enhancement due to irritation, and borderline right hydronephrosis. There is also a 2 mm left kidney lower pole nonobstructive calculus. 5. Other imaging findings of potential clinical significance: Atherosclerosis. Calcified mitral valve. Spondylosis and degenerative disc disease causing multilevel lumbar impingement.   Electronically Signed   By: Van Clines M.D.   On: 12/31/2014 14:31      Abdomen Pelvis W Contrast  10/06/2014   CLINICAL DATA:  74 year old male with difficulty  swallowing for the past 2-3 months. Newly diagnosed adenocarcinoma of the esophagus. Evaluate for metastatic disease.  EXAM: CT CHEST AND ABDOMEN WITH CONTRAST  TECHNIQUE: Multidetector CT imaging of the chest and abdomen was performed following the standard protocol during bolus administration of intravenous contrast.  CONTRAST:  134mL OMNIPAQUE IOHEXOL 300 MG/ML  SOLN  COMPARISON:  No priors.  FINDINGS: CT CHEST FINDINGS  Mediastinum/Lymph Nodes: Heart size is normal. There is no significant pericardial fluid, thickening or pericardial calcification. There is atherosclerosis of the thoracic aorta, the great vessels of the mediastinum and the coronary arteries, including calcified atherosclerotic plaque in the right coronary artery. Calcifications of the mitral annulus and posterior leaflet of the mitral valve. Calcifications of the aortic valve. Circumferential thickening of the distal esophagus immediately above the gastroesophageal junction. 9 mm paraesophageal lymph node in the lower mediastinum (image 48 of series 2). No other pathologically enlarged mediastinal or hilar lymph nodes are noted. No axillary lymphadenopathy.  Lungs/Pleura: Multiple small pulmonary nodules scattered throughout the lungs bilaterally, the largest of which is a pleural-based 15 x 7 mm nodule (image 32 of series 3), which is surrounded by a halo of ground-glass attenuation which in total measures 16 x 24 mm. Mild linear scarring in the left lower lobe. No acute consolidative airspace disease. No pleural effusions.  Musculoskeletal/Soft Tissues: There are no aggressive appearing lytic or blastic lesions noted in the visualized portions of the skeleton.  CT ABDOMEN AND PELVIS FINDINGS  Hepatobiliary: No cystic or solid hepatic lesions. No intra or extrahepatic biliary ductal dilatation. Gallbladder is normal in appearance.  Pancreas: No pancreatic mass. No pancreatic ductal dilatation. No pancreatic or peripancreatic fluid or  inflammatory changes.  Spleen: Unremarkable.  Adrenals/Urinary Tract: Bilateral adrenal glands are normal in appearance. 11 mm intermediate attenuation (34 HU) lesion in the medial aspect of the interpolar region of the right kidney is incompletely characterized, but favored to represent a tiny proteinaceous cyst. Sub cm low-attenuation lesion in the upper pole the left kidney is also too small to characterize, but favored to represent a cyst. 4 mm nonobstructive calculi are present within the lower pole collecting systems of the kidneys bilaterally. No ureteral stones or hydroureteronephrosis to suggest urinary tract obstruction at this time. Urinary bladder is unremarkable.  Stomach/Bowel: The thickening at the gastroesophageal junction does not appear to extend into the proximal stomach. Stomach is normal in appearance. No pathologic dilatation of small bowel or colon. Colonic diverticulosis, particularly in the sigmoid colon, without surrounding inflammatory changes to suggest an acute diverticulitis at this time.  Vascular/Lymphatic: Atherosclerosis throughout the abdominal and pelvic vasculature, without evidence of aneurysm or dissection. Multiple prominent but nonenlarged retroperitoneal lymph nodes are noted, conspicuous by their number rather than their size (nonspecific). No pathologically enlarged lymph nodes are noted in the abdomen or pelvis.  Reproductive: Prostate gland is enlarged, with severe median lobe hypertrophy. Prostate gland is heterogeneous in appearance with multifocal coarse calcifications (nonspecific). Seminal vesicles are unremarkable.  Other: No significant volume of ascites.  No pneumoperitoneum.  Musculoskeletal: There are no aggressive appearing lytic or blastic lesions noted in the visualized portions of the skeleton.  IMPRESSION: 1. Circumferential thickening of the distal esophagus just before the gastroesophageal junction. A single borderline enlarged paraesophageal lymph node  noted, which is nonspecific but concerning. In addition, there are several pulmonary nodules in the lungs bilaterally which are nonspecific. The largest of these is a mixed solid and sub solid lesion in the posterior aspect of the right upper lobe. While this may be infectious or inflammatory, the possibility of a metastatic lesion or lesions is not excluded, and close attention on followup studies is recommended. If the patient has symptoms of pulmonary infection, trial of antimicrobial therapy is recommended, followed by short-term repeat noncontrast chest CT in 2-3 weeks. If there are no such symptoms, further evaluation with PET-CT could be considered, and if this lesion in the right lower lobe is hypermetabolic, biopsy may be appropriate. 2. Tiny indeterminate renal lesions bilaterally, as above. Attention at time of follow-up imaging is recommended. 3. Atherosclerosis, including right coronary artery disease. Assessment for potential risk factor modification, dietary therapy or pharmacologic therapy may be warranted, if clinically indicated. 4. Colonic diverticulosis without findings to suggest acute diverticulitis at this time. 5. Prostatomegaly with severe median lobe hypertrophy. 6. Additional incidental findings, as above.   Electronically Signed   By: Vinnie Langton M.D.   On: 10/06/2014 15:04   Nm Pet Image Initial (  pi) Skull Base To Thigh  10/23/2014   CLINICAL DATA:  Initial treatment strategy for esophageal carcinoma.  EXAM: NUCLEAR MEDICINE PET SKULL BASE TO THIGH  TECHNIQUE: 12.5 mCi F-18 FDG was injected intravenously. Full-ring PET imaging was performed from the skull base to thigh after the radiotracer. CT data was obtained and used for attenuation correction and anatomic localization.  FASTING BLOOD GLUCOSE:  Value: 106 mg/dl  COMPARISON:  CT 10/06/2014  FINDINGS: NECK  No hypermetabolic lymph nodes in the neck.  CHEST  There is hypermetabolic thickening through the distal esophagus with SUV  max equal 8.9. Small paraesophageal lymph nodes have faint nonspecific activity (8 mm on image 107, series and 7 mm on image 113 series 4).  There is a focus of peripheral consolidation in the posterior aspect of the right lower lobe along the plueral surface which measures 14 mm and is not changed in size from 15 mm on CT of 10/06/2014. This lesion has very low metabolic activity. There is no additional hypermetabolic pulmonary nodules.  ABDOMEN/PELVIS  No abnormal hypermetabolic activity within the liver, pancreas, adrenal glands, or spleen. No hypermetabolic lymph nodes in the abdomen or pelvis.  SKELETON  No focal hypermetabolic activity to suggest skeletal metastasis.  IMPRESSION: 1. Intense metabolic activity through the distal esophagus consistent with primary carcinoma. 2. Two small paraesophageal lymph nodes are suspicious for local nodal metastasis. These have low metabolic activity. 3. No evidence of liver metastasis. 4. Peripheral nodule within the right lower lobe is not changed in size. These lesions have very low nonspecific metabolic activity. Favor a focus infection, inflammation, or infarction.   Electronically Signed   By: Suzy Bouchard M.D.   On: 10/23/2014 14:55  ADDENDUM REPORT: 12/31/2014 14:46  ADDENDUM: Hypermetabolic focus in PROSTATE gland localizes to the right mid gland peripheral zone and measures approximately 1 cm (image 195 of fused data set). Recommend correlation with PSA and if elevated recommend urology consultation.   Electronically Signed  By: Suzy Bouchard M.D.  On: 12/31/2014 14:46   I have independently reviewed the above radiologic studies.  Recent Lab Findings: Lab Results  Component Value Date   WBC 3.8* 12/30/2014   HGB 14.9 12/30/2014   HCT 43.9 12/30/2014   PLT 143 12/30/2014   GLUCOSE 94 11/26/2014   ALT 15 11/26/2014   AST 16 11/26/2014   NA 142 11/26/2014   K 4.1 11/26/2014   CL 110 08/20/2014   CREATININE 0.8 11/26/2014    BUN 19.0 11/26/2014   CO2 28 11/26/2014   INR 1.36 06/16/2009   PATH:[facility] Patient: WILGUS, DEYTON A Collected: 09/28/2014 Client: [facility] Accession: [id number] Diagnosis Surgical [P], distal esophagus mass - INVASIVE ADENOCARCINOMA IN A BACKGROUND OF [name]'S ESOPHAGUS.     Endoscopic findings: 1. Clearly malignant mass at the GE juntion. The mass appears non-circumferential by endocopy, occupying about 1/2-2/3 the circumference of the distal esophagus. This is creating an incomplete GE junction stricture, lumen through the stricture is 5-3mm and so I was unable to evaluate the distal aspect of the tumor. EUS findings: 1. The mass above was actually circumferntial by EUS, maximum thickness 1.4cm. The mass clear passes into and through the muscularis propria layer of the distal esophagus wall (uT3). 2. There were three suspicious (round, well demarcated, hypoechoic) lymphnodes adjacent to the mass 9uN2). The largest lymphnode was 1cm across. ENDOSCOPIC IMPRESSION: [id number] GE junction adenocarcinoma (Stage IIIb). The mass is creating an incomplete stricture that was not able to be passed with the  echoendoscope (lumen 5-65mm).   Assessment / Plan:    INVASIVE ADENOCARCINOMA OF THE DISTAL ESOPHAGUS IN A BACKGROUND OF [name]'S ESOPHAGUS,  clinical stage IIIB (uT3N2cM0)  Siewert I,  primarily involving the distal esophagus and GE junction- I have discussed with the patient his wife and daughter the dx and indication for multimodality therapy including chem, radiation and surgical resection. I further discussed with the patient the surgical options and recommend that we proceed with resection oct 10   He's had a distant history of atrial fibrillation many years ago, currently no significant cardiac symptoms but does have a positive family history for coronary artery disease, we'll obtain cardiac clearance as soon as possible-He has been seen by Dr Meda Coffee and had cardiac  clearence.  Will refer to Dr Diona Fanti to review prostate on pet scan and renal stones- see addendum to pet scan   I  spent 30 minutes counseling the patient face to face and 50% or more the  time was spent in counseling and coordination of care. The total time spent in the appointment was 63minutes.  Grace Isaac MD      [address] [place] [address] Office [phone]   Beeper [phone]  12/31/2014 2:34 PM

## 2015-01-01 ENCOUNTER — Other Ambulatory Visit: Payer: Self-pay | Admitting: *Deleted

## 2015-01-01 DIAGNOSIS — C159 Malignant neoplasm of esophagus, unspecified: Secondary | ICD-10-CM

## 2015-01-05 NOTE — Progress Notes (Addendum)
Patient ID: David [name], male   DOB: 07-Jan-1941, 74 y.o.   MRN: [id number]  Addendum and Correction of my note from Office visit on 12/11/2014:  Patient's ECG is normal and NOT suggestive of ischemia.  He doesn't require ischemic work up prior to the surgery as he is considered a low risk for a moderate risk surgery.  Dorothy Spark 01/05/2015

## 2015-01-06 DIAGNOSIS — N200 Calculus of kidney: Secondary | ICD-10-CM | POA: Diagnosis not present

## 2015-01-06 DIAGNOSIS — R938 Abnormal findings on diagnostic imaging of other specified body structures: Secondary | ICD-10-CM | POA: Diagnosis not present

## 2015-01-06 DIAGNOSIS — N401 Enlarged prostate with lower urinary tract symptoms: Secondary | ICD-10-CM | POA: Diagnosis not present

## 2015-01-06 DIAGNOSIS — R3912 Poor urinary stream: Secondary | ICD-10-CM | POA: Diagnosis not present

## 2015-01-15 ENCOUNTER — Other Ambulatory Visit: Payer: Self-pay | Admitting: *Deleted

## 2015-01-15 DIAGNOSIS — C159 Malignant neoplasm of esophagus, unspecified: Secondary | ICD-10-CM

## 2015-01-21 ENCOUNTER — Ambulatory Visit: Payer: Self-pay | Admitting: Radiation Oncology

## 2015-01-22 ENCOUNTER — Telehealth: Payer: Self-pay | Admitting: *Deleted

## 2015-01-22 NOTE — Telephone Encounter (Signed)
"  Please let Dr. Benay Spice know I am scheduled for surgery.  I am to arrive at 5:30 am for the Esophageal Removal by Dr. Servando Snare is February 01, 2015." Informed him this  nurse will notify Dr. Benay Spice.

## 2015-01-28 ENCOUNTER — Ambulatory Visit (INDEPENDENT_AMBULATORY_CARE_PROVIDER_SITE_OTHER): Payer: Medicare Other | Admitting: Cardiothoracic Surgery

## 2015-01-28 ENCOUNTER — Encounter (HOSPITAL_COMMUNITY): Payer: Self-pay

## 2015-01-28 ENCOUNTER — Encounter (HOSPITAL_COMMUNITY)
Admission: RE | Admit: 2015-01-28 | Discharge: 2015-01-28 | Disposition: A | Discharge status: Home / Self Care | Payer: Medicare Other | Source: Ambulatory Visit | Attending: Cardiothoracic Surgery | Admitting: Cardiothoracic Surgery

## 2015-01-28 ENCOUNTER — Encounter: Payer: Self-pay | Admitting: Cardiothoracic Surgery

## 2015-01-28 ENCOUNTER — Encounter: Payer: Self-pay | Admitting: Radiation Oncology

## 2015-01-28 ENCOUNTER — Ambulatory Visit (HOSPITAL_COMMUNITY)
Admission: RE | Admit: 2015-01-28 | Discharge: 2015-01-28 | Disposition: A | Discharge status: Home / Self Care | Payer: Medicare Other | Source: Ambulatory Visit | Attending: Radiation Oncology | Admitting: Radiation Oncology

## 2015-01-28 VITALS — BP 129/75 | HR 90 | Resp 20 | Ht 75.0 in | Wt 242.0 lb

## 2015-01-28 VITALS — BP 131/63 | HR 85 | Resp 16 | Wt 242.9 lb

## 2015-01-28 VITALS — BP 140/72 | HR 78 | Temp 98.2°F | Resp 18 | Ht 75.0 in | Wt 241.3 lb

## 2015-01-28 DIAGNOSIS — C155 Malignant neoplasm of lower third of esophagus: Secondary | ICD-10-CM | POA: Diagnosis not present

## 2015-01-28 DIAGNOSIS — C160 Malignant neoplasm of cardia: Secondary | ICD-10-CM

## 2015-01-28 DIAGNOSIS — Z923 Personal history of irradiation: Secondary | ICD-10-CM | POA: Diagnosis not present

## 2015-01-28 DIAGNOSIS — C159 Malignant neoplasm of esophagus, unspecified: Secondary | ICD-10-CM

## 2015-01-28 HISTORY — DX: Barrett's esophagus without dysplasia: K22.70

## 2015-01-28 HISTORY — DX: Reserved for concepts with insufficient information to code with codable children: IMO0002

## 2015-01-28 HISTORY — DX: Frequency of micturition: R35.0

## 2015-01-28 HISTORY — DX: Presence of spectacles and contact lenses: Z97.3

## 2015-01-28 HISTORY — DX: Reserved for inherently not codable concepts without codable children: IMO0001

## 2015-01-28 HISTORY — DX: Cardiac arrhythmia, unspecified: I49.9

## 2015-01-28 HISTORY — DX: Gastro-esophageal reflux disease without esophagitis: K21.9

## 2015-01-28 LAB — URINALYSIS, ROUTINE W REFLEX MICROSCOPIC
Bilirubin Urine: NEGATIVE
Glucose, UA: NEGATIVE mg/dL
Hgb urine dipstick: NEGATIVE
Ketones, ur: NEGATIVE mg/dL
Leukocytes, UA: NEGATIVE
Nitrite: NEGATIVE
Protein, ur: NEGATIVE mg/dL
Specific Gravity, Urine: 1.02 (ref 1.005–1.030)
Urobilinogen, UA: 0.2 mg/dL (ref 0.0–1.0)
pH: 7 (ref 5.0–8.0)

## 2015-01-28 LAB — PROTIME-INR
INR: 1.06 (ref 0.00–1.49)
Prothrombin Time: 14 seconds (ref 11.6–15.2)

## 2015-01-28 LAB — BLOOD GAS, ARTERIAL
Acid-Base Excess: 1.1 mmol/L (ref 0.0–2.0)
Bicarbonate: 24.9 mEq/L — ABNORMAL HIGH (ref 20.0–24.0)
Drawn by: 42180
FIO2: 0.21
O2 Saturation: 96.5 %
Patient temperature: 98.6
TCO2: 26.1 mmol/L (ref 0–100)
pCO2 arterial: 37.7 mmHg (ref 35.0–45.0)
pH, Arterial: 7.435 (ref 7.350–7.450)
pO2, Arterial: 86 mmHg (ref 80.0–100.0)

## 2015-01-28 LAB — CBC
HCT: 43.3 % (ref 39.0–52.0)
Hemoglobin: 14.9 g/dL (ref 13.0–17.0)
MCH: 33.6 pg (ref 26.0–34.0)
MCHC: 34.4 g/dL (ref 30.0–36.0)
MCV: 97.7 fL (ref 78.0–100.0)
Platelets: 150 10*3/uL (ref 150–400)
RBC: 4.43 MIL/uL (ref 4.22–5.81)
RDW: 14.1 % (ref 11.5–15.5)
WBC: 4.7 10*3/uL (ref 4.0–10.5)

## 2015-01-28 LAB — PSA: PSA: 2.1 ng/mL (ref 0.00–4.00)

## 2015-01-28 LAB — COMPREHENSIVE METABOLIC PANEL
ALT: 29 U/L (ref 17–63)
AST: 31 U/L (ref 15–41)
Albumin: 3.7 g/dL (ref 3.5–5.0)
Alkaline Phosphatase: 74 U/L (ref 38–126)
Anion gap: 8 (ref 5–15)
BUN: 18 mg/dL (ref 6–20)
CO2: 24 mmol/L (ref 22–32)
Calcium: 9.4 mg/dL (ref 8.9–10.3)
Chloride: 109 mmol/L (ref 101–111)
Creatinine, Ser: 0.79 mg/dL (ref 0.61–1.24)
GFR calc Af Amer: >60 mL/min (ref >60)
GFR calc non Af Amer: >60 mL/min (ref >60)
Glucose, Bld: 108 mg/dL — ABNORMAL HIGH (ref 65–99)
Potassium: 4.1 mmol/L (ref 3.5–5.1)
Sodium: 141 mmol/L (ref 135–145)
Total Bilirubin: 0.5 mg/dL (ref 0.3–1.2)
Total Protein: 6.6 g/dL (ref 6.5–8.1)

## 2015-01-28 LAB — SURGICAL PCR SCREEN
MRSA, PCR: NEGATIVE
Staphylococcus aureus: POSITIVE — AB

## 2015-01-28 LAB — APTT: aPTT: 30 seconds (ref 24–37)

## 2015-01-28 NOTE — Pre-Procedure Instructions (Signed)
    JAVORIS STAR  01/28/2015      [id number] - [place] [address] [place] ([place]) [place] [address] Phone: [phone] Fax: [phone]  [id number] - [place], [place] [address] Phone: [phone] Fax: [phone]    Your procedure is scheduled on Monday, October 10th, 2016.  Report to [facility] Admitting at 5:30 A.M.  Call this number if you have problems the morning of surgery:  [phone]   Remember:  Do not eat food or drink liquids after midnight.   Take these medicines the morning of surgery with A SIP OF WATER: Amlodipine (Norvasc), Pantoprazole (Protonix).  Stop taking: Aspirin, NSAIDS, Aleve, Naproxen, Ibuprofen, Advil, Motrin, BC's, Goody's, fish oil, all herbal medications, and all vitamins.     Do not wear jewelry.  Do not wear lotions, powders, or colognes.  You may NOT wear deodorant.  Men may shave face and neck.  Do not bring valuables to the hospital.  [facility] is not responsible for any belongings or valuables.  Contacts, dentures or bridgework may not be worn into surgery.  Leave your suitcase in the car.  After surgery it may be brought to your room.  For patients admitted to the hospital, discharge time will be determined by your treatment team.  Patients discharged the day of surgery will not be allowed to drive home.   Special instructions:  See attached.   Please read over the following fact sheets that you were given. Pain Booklet, Coughing and Deep Breathing, Blood Transfusion Information, MRSA Information and Surgical Site Infection Prevention

## 2015-01-28 NOTE — Progress Notes (Signed)
Patient notified of PCR results and verbalized understanding.  Prescription called in to [facility] [address].

## 2015-01-28 NOTE — Patient Instructions (Addendum)
  Preparation for your surgery: Bowel Prep  You will need to undergo a bowel prep starting 2 days prior to your surgery.  You will start clear liquids mid-day Saturday and all day Sunday.  Nothing to eat to drink after Sunday at midnight.  Clear liquid diet:  This includes broths (vegetable, chicken or beef), juices (grape, apple, or cranberry), Jell-O, popsicles, coffee or tea (without milk or cream) and Gatorade. Hard candy and gum are OK. Avoid alcohol and all solid food. You should take in at LEAST 10 cups of fluid this day. In addition to the clear liquid diet (that you will remain on the entire day).  You will also need to drink ONE 10 ounce bottle of Magnesium Citrate on Sunday. It is best if you can drink it mid-morning, around 10 am (you can buy this over the counter at your neighborhood pharmacy).

## 2015-01-28 NOTE — Progress Notes (Addendum)
Weight and vitals stable. Denies pain. Surgery with Roxy Horseman is scheduled for Monday. Patient denies any pain or difficulty associated with swallowing. Skin within treatment field is of normal color and appearance. Patient continues to walk one mile per day. Denies fatigue. Patient without any complaints.   BP 131/63 mmHg  Pulse 85  Resp 16  Wt 242 lb 14.4 oz (110.179 kg)  SpO2 100% Wt Readings from Last 3 Encounters:  01/28/15 242 lb 14.4 oz (110.179 kg)  12/31/14 240 lb (108.863 kg)  12/23/14 241 lb 14.4 oz (109.725 kg)

## 2015-01-28 NOTE — Progress Notes (Signed)
[address]       [name] [address]             [phone]                    David [name] Medical Record #[id number] Date of Birth: 06-09-1940  Referring: Dr Benay Spice Primary Care: Marton Redwood, MD  Chief Complaint:    Chief Complaint  Patient presents with  . Esophageal Cancer    4 week f/u further discuss surgery scheduled for 02/01/2015    History of Present Illness:    David [name] 74 y.o. male is seen  for follow up treatment for esophageal cancer. Patient noted  February 2016 some difficulty in swallowing and painful swallowing. This acutely became [name] problem with food obstruction requiring endoscopy. Endoscopy confirmed [name] distal esophageal mass at the GE junction. Subsequent biopsy confirmed adenocarcinoma. CT scan of the chest and abdomen , PET scan  and esophageal ultrasound  has been performed  Patient has been able to maintain his weight and nutrition. He started on Taxol carboplatinum completing his course of preoperative chemotherapy on 11/30/2014 he will complete radiation therapy on August 17.  He has tolerated the radiation chemotherapy very well with minimal side effects. Notes that he's been able to maintain his usual level of activity. His swallowing has improved.  Patient is seen Dr. Meda Coffee with cardiology for preoperative clearance. He's also seen by urology for  Prostate evaluation.  Current Activity/ Functional Status:  Patient is independent with mobility/ambulation, transfers, ADL's, IADL's.   Zubrod Score: At the time of surgery this patient's most appropriate activity status/level should be described as: [x]     0    Normal activity, no symptoms []     1    Restricted in physical strenuous activity but ambulatory, able to do out light work []     2    Ambulatory and capable of self care, unable to do work activities, up and about               >50 % of waking hours                              []     3    Only  limited self care, in bed greater than 50% of waking hours []     4    Completely disabled, no self care, confined to bed or chair []     5    Moribund   Past Medical History  Diagnosis Date  . Hypertension   . Anxiety   . Arthritis   . Basal cell carcinoma     nose, face, back  . Food impaction of esophagus 08/20/2014  . Esophageal cancer ([place])   . Enlarged prostate   . Allergy   . Radiation   . [name]'s esophagus   . Dysrhythmia   . Kidney stone   . Urinary frequency   . GERD (gastroesophageal reflux disease)   . Wears glasses     Past Surgical History  Procedure Laterality Date  . Total knee arthroplasty  2010    right  . Cervical laminectomy  1980  . Appendectomy  1961  . Colonoscopy      x2  . Esophagogastroduodenoscopy N/[name] 08/20/2014    Procedure: ESOPHAGOGASTRODUODENOSCOPY (EGD);  Surgeon: Gatha Mayer, MD;  Location: [facility] ENDOSCOPY;  Service: Endoscopy;  Laterality: N/[name];  .  Eus N/[name] 10/15/2014    Procedure: UPPER ENDOSCOPIC ULTRASOUND (EUS) LINEAR;  Surgeon: Milus Banister, MD;  Location: [facility] ENDOSCOPY;  Service: Endoscopy;  Laterality: N/[name];  radial linear    Family History  Problem Relation Age of Onset  . Stomach cancer Neg Hx   . Colon cancer Neg Hx   . Lymphoma Mother   . Cancer Maternal Uncle   . Cancer Paternal Uncle   . Cancer Paternal Grandmother    Patient had one son who died at age 67 of adenocarcinoma the lung was [name] nonsmoker, presented with brain metastases and lived approximate 20 months post diagnosis. His father had bypass surgery developed [name] blood dyscrasia and died at age 65, his mother had lymphoma and died at age 71.   History   Social History  . Marital Status: Married    Spouse Name: N/[name]  . Number of Children: N/[name]  . Years of Education: N/[name]   Occupational History  .  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 Smoking status: Former Smoker    Quit date: 06/07/1961  . Smokeless tobacco: Never  Used  . Alcohol Use: No  . Drug Use: No  . Sexual Activity: Not Currently            History  Smoking status  . Former Smoker  . Quit date: 06/07/1961  Smokeless tobacco  . Never Used    History  Alcohol Use No     Allergies  Allergen Reactions  . Nexium [Esomeprazole Magnesium] Other (See Comments)    Difficulty urinating and passing stool; dry mouth  . Other     Thinks it was oxycodone; made him hallucinate  . Statins Other (See Comments)    Leg cramping    Current Outpatient Prescriptions  Medication Sig Dispense Refill  . ALPRAZolam (XANAX) 0.5 MG tablet Take 0.5 mg by mouth at bedtime as needed for sleep.   4  . amLODipine (NORVASC) 5 MG tablet Take 5 mg by mouth daily.  0  . aspirin 81 MG tablet Take 81 mg by mouth daily.    . benazepril (LOTENSIN) 40 MG tablet Take 40 mg by mouth daily.    . furosemide (LASIX) 40 MG tablet Take 40 mg by mouth daily.  3  . Liniments (DEEP BLUE RELIEF) GEL Apply 1 application topically daily as needed (FOR PAIN).    Marland Kitchen pantoprazole (PROTONIX) 40 MG tablet Take 1 tablet (40 mg total) by mouth daily. 90 tablet 3  . potassium citrate (UROCIT-K) 10 MEQ (1080 MG) SR tablet Take 10 mEq by mouth 3 (three) times daily with meals.    . Tamsulosin HCl (FLOMAX) 0.4 MG CAPS Take 0.4 mg by mouth daily after supper.     No current facility-administered medications for this visit.      Review of Systems:     Cardiac Review of Systems: Y or N  Chest Pain [   n ]  Resting SOB [  n ] Exertional SOB  [n  ]  Orthopnea [ n ]   Pedal Edema [n   ]    Palpitations [ 25 years ago ] Syncope  [ n ]   Presyncope [n   ]  General Review of Systems: [Y] = yes [  ]=no Constitional: recent weight change [  ];  Wt loss over the last 3 months [  260 to 236 had been trying to loose weight ]  anorexia [  ]; fatigue [  ]; nausea [  ]; night sweats [  ]; fever [  ]; or chills [  ];          Dental: poor dentition[  ]; Last Dentist visit:   Eye : blurred vision [   ]; diplopia [   ]; vision changes [ y ];  Amaurosis fugax[  ]; Resp: cough [  ];  wheezing[  ];  hemoptysis[  ]; shortness of breath[  ]; paroxysmal nocturnal dyspnea[  ]; dyspnea on exertion[  ]; or orthopnea[  ];  GI:  gallstones[  ], vomiting[  ];  dysphagia[  ]; melena[  ];  hematochezia [  ]; heartburn[  ];   Hx of  Colonoscopy[y  ]; GU: kidney stones [ n ]; hematuria[ n ];   dysuria [  ];  nocturia[y  ];  history of     obstruction [  n]; urinary frequency Blue.Reese  ]             Skin: rash, swelling[  ];, hair loss[  ];  peripheral edema[  ];  or itching[  ]; Musculosketetal: myalgias[y  ];  joint swelling[  ];  joint erythema[  ];  joint pain[  ];  back pain[  ];  Heme/Lymph: bruising[  ];  bleeding[  ];  anemia[  ];  Neuro: TIA[n  ];  headaches[ n ];  stroke[n  ];  vertigo[  ];  seizures[n  ];   paresthesias[ n ];  difficulty walking[n  ];  Psych:depression[  ]; anxiety[  ];  Endocrine: diabetes[  ];  thyroid dysfunction[  ];  Immunizations: Flu up to date Florencio.Farrier  ]; Pneumococcal up to date [ y ];  Other:  Physical Exam: Ht 6\' 3"  (1.905 m)  Wt 242 lb (109.77 kg)  BMI 30.25 kg/m2  SpO2   PHYSICAL EXAMINATION: General appearance: alert and cooperative Head: Normocephalic, without obvious abnormality, atraumatic Neck: no adenopathy, no carotid bruit, no JVD, supple, symmetrical, trachea midline and thyroid not enlarged, symmetric, no tenderness/mass/nodules Lymph nodes: Cervical, supraclavicular, and axillary nodes normal. Resp: clear to auscultation bilaterally Back: symmetric, no curvature. ROM normal. No CVA tenderness. Cardio: regular rate and rhythm, S1, S2 normal, no murmur, click, rub or gallop GI: soft, non-tender; bowel sounds normal; no masses,  no organomegaly Extremities: extremities normal, atraumatic, no cyanosis or edema and Homans sign is negative, no sign of DVT Neurologic: Grossly normal Patient has no carotid bruits, palpable DP and PT pulses bilaterally  Diagnostic  Studies & Laboratory data:     Recent Radiology Findings: Ct Chest Abdomen W Contrast  12/31/2014   CLINICAL DATA:  Carcinoma of the lower third of the esophagus. Preoperative assessment.  EXAM: CT CHEST AND ABDOMEN WITHOUT CONTRAST  TECHNIQUE: Multidetector CT imaging of the chest and abdomen was performed following the standard protocol without intravenous contrast.  COMPARISON:  10/23/2014  FINDINGS: CT CHEST FINDINGS  Mediastinum/Nodes: Left supraclavicular lymph node 7 mm in short axis on image 11 series 3, previously present on the PET-CT and not visibly hypermetabolic.  Aortic arch atherosclerosis.  Calcified mitral valve.  Distal esophageal wall thickening just above the hiatus. Adjacent lymph nodes include [name] 9 mm short axis node on image 53 series 3 and [name] 6 mm node on image 55 series 3, concerning due to their proximity to the mass. 5 mm short axis lymph node anterior to the aorta along image 49 series 3.  Lungs/Pleura: 5 mm in  width nodule in the right lower lobe medially on image 57 of series 7. This is in [name] location rib pre but frequently corresponding to atelectasis, but is persistent from 10/06/2014.  Scarring or subsegmental atelectasis in the posterior basal segment right lower lobe. Pleural-based nodule on the right side measures 8 mm in thickness and has [name] faint surrounding halo of ground-glass opacity. This is similar to the prior exam and adjacent to some mild irregularity of the right posterior eighth and ninth ribs probably representing old rib fractures.  0.7 by 0.5 cm pulmonary nodule posterior to the right bronchus intermedius on image 34 series 7, stable but more sharply defined on today's exam due to slice selection.  Musculoskeletal: Lower cervical and thoracic spondylosis. Old right posterior rib deformities adjacent to the pleural thickening as noted above.  CT ABDOMEN FINDINGS  Hepatobiliary: Unremarkable  Pancreas: Unremarkable  Spleen: Unremarkable  Adrenals/Urinary Tract:  Hypodense 8 mm medial right mid kidney lesion is technically too small to characterize.  6 mm in long axis right UPJ stone with surrounding thickening and enhancement of the collecting system, and borderline hydronephrosis. 2 mm left kidney lower pole nonobstructive calculus. 6 mm hypodense lesion of the left kidney upper pole, likely [name] cyst but technically too small to characterize.  Stomach/Bowel: Unremarkable  Vascular/Lymphatic: No pathologic gastrohepatic ligament or porta hepatis adenopathy. Aortoiliac atherosclerotic vascular disease.  Other: No supplemental non-categorized findings.  Musculoskeletal: Lumbar spondylosis and degenerative disc disease. Large Schmorl's node along the inferior endplate of L4 with surrounding sclerosis likely from degenerative endplate findings. Similar Schmorl's node along the superior endplate of L5. Transitional S1 vertebra. Spondylosis and degenerative disc disease cause multilevel impingement in the lumbar spine.  IMPRESSION: 1. Abnormal distal esophageal wall thickening is present compatible with esophageal cancer. 2 adjacent lymph nodes are probably involved. 2. Several right lung nodules if in the 5-6 mm range merit observation. These were present but not hypermetabolic on prior PET-CT, but below sensitive PET-CT size thresholds. 3. Pleural-based nodularity measuring 8 mm in thickness along the right posterior hemithorax was not previously hypermetabolic, and is adjacent to some old rib fractures -this probably is [name] residua from the prior rib fractures but likewise merit some observation. 4. 6 mm in long axis right UPJ stone with surrounding mucosal thickening/enhancement due to irritation, and borderline right hydronephrosis. There is also [name] 2 mm left kidney lower pole nonobstructive calculus. 5. Other imaging findings of potential clinical significance: Atherosclerosis. Calcified mitral valve. Spondylosis and degenerative disc disease causing multilevel lumbar  impingement.   Electronically Signed   By: Van Clines M.D.   On: 12/31/2014 14:31      Abdomen Pelvis W Contrast  10/06/2014   CLINICAL DATA:  74 year old male with difficulty swallowing for the past 2-3 months. Newly diagnosed adenocarcinoma of the esophagus. Evaluate for metastatic disease.  EXAM: CT CHEST AND ABDOMEN WITH CONTRAST  TECHNIQUE: Multidetector CT imaging of the chest and abdomen was performed following the standard protocol during bolus administration of intravenous contrast.  CONTRAST:  180mL OMNIPAQUE IOHEXOL 300 MG/ML  SOLN  COMPARISON:  No priors.  FINDINGS: CT CHEST FINDINGS  Mediastinum/Lymph Nodes: Heart size is normal. There is no significant pericardial fluid, thickening or pericardial calcification. There is atherosclerosis of the thoracic aorta, the great vessels of the mediastinum and the coronary arteries, including calcified atherosclerotic plaque in the right coronary artery. Calcifications of the mitral annulus and posterior leaflet of the mitral valve. Calcifications of the aortic valve. Circumferential thickening of the  distal esophagus immediately above the gastroesophageal junction. 9 mm paraesophageal lymph node in the lower mediastinum (image 48 of series 2). No other pathologically enlarged mediastinal or hilar lymph nodes are noted. No axillary lymphadenopathy.  Lungs/Pleura: Multiple small pulmonary nodules scattered throughout the lungs bilaterally, the largest of which is [name] pleural-based 15 x 7 mm nodule (image 32 of series 3), which is surrounded by [name] halo of ground-glass attenuation which in total measures 16 x 24 mm. Mild linear scarring in the left lower lobe. No acute consolidative airspace disease. No pleural effusions.  Musculoskeletal/Soft Tissues: There are no aggressive appearing lytic or blastic lesions noted in the visualized portions of the skeleton.  CT ABDOMEN AND PELVIS FINDINGS  Hepatobiliary: No cystic or solid hepatic lesions. No intra or  extrahepatic biliary ductal dilatation. Gallbladder is normal in appearance.  Pancreas: No pancreatic mass. No pancreatic ductal dilatation. No pancreatic or peripancreatic fluid or inflammatory changes.  Spleen: Unremarkable.  Adrenals/Urinary Tract: Bilateral adrenal glands are normal in appearance. 11 mm intermediate attenuation (34 HU) lesion in the medial aspect of the interpolar region of the right kidney is incompletely characterized, but favored to represent [name] tiny proteinaceous cyst. Sub cm low-attenuation lesion in the upper pole the left kidney is also too small to characterize, but favored to represent [name] cyst. 4 mm nonobstructive calculi are present within the lower pole collecting systems of the kidneys bilaterally. No ureteral stones or hydroureteronephrosis to suggest urinary tract obstruction at this time. Urinary bladder is unremarkable.  Stomach/Bowel: The thickening at the gastroesophageal junction does not appear to extend into the proximal stomach. Stomach is normal in appearance. No pathologic dilatation of small bowel or colon. Colonic diverticulosis, particularly in the sigmoid colon, without surrounding inflammatory changes to suggest an acute diverticulitis at this time.  Vascular/Lymphatic: Atherosclerosis throughout the abdominal and pelvic vasculature, without evidence of aneurysm or dissection. Multiple prominent but nonenlarged retroperitoneal lymph nodes are noted, conspicuous by their number rather than their size (nonspecific). No pathologically enlarged lymph nodes are noted in the abdomen or pelvis.  Reproductive: Prostate gland is enlarged, with severe median lobe hypertrophy. Prostate gland is heterogeneous in appearance with multifocal coarse calcifications (nonspecific). Seminal vesicles are unremarkable.  Other: No significant volume of ascites.  No pneumoperitoneum.  Musculoskeletal: There are no aggressive appearing lytic or blastic lesions noted in the visualized portions  of the skeleton.  IMPRESSION: 1. Circumferential thickening of the distal esophagus just before the gastroesophageal junction. [name] single borderline enlarged paraesophageal lymph node noted, which is nonspecific but concerning. In addition, there are several pulmonary nodules in the lungs bilaterally which are nonspecific. The largest of these is [name] mixed solid and sub solid lesion in the posterior aspect of the right upper lobe. While this may be infectious or inflammatory, the possibility of [name] metastatic lesion or lesions is not excluded, and close attention on followup studies is recommended. If the patient has symptoms of pulmonary infection, trial of antimicrobial therapy is recommended, followed by short-term repeat noncontrast chest CT in 2-3 weeks. If there are no such symptoms, further evaluation with PET-CT could be considered, and if this lesion in the right lower lobe is hypermetabolic, biopsy may be appropriate. 2. Tiny indeterminate renal lesions bilaterally, as above. Attention at time of follow-up imaging is recommended. 3. Atherosclerosis, including right coronary artery disease. Assessment for potential risk factor modification, dietary therapy or pharmacologic therapy may be warranted, if clinically indicated. 4. Colonic diverticulosis without findings to suggest acute diverticulitis at this  time. 5. Prostatomegaly with severe median lobe hypertrophy. 6. Additional incidental findings, as above.   Electronically Signed   By: Vinnie Langton M.D.   On: 10/06/2014 15:04   Nm Pet Image Initial (pi) Skull Base To Thigh  10/23/2014   CLINICAL DATA:  Initial treatment strategy for esophageal carcinoma.  EXAM: NUCLEAR MEDICINE PET SKULL BASE TO THIGH  TECHNIQUE: 12.5 mCi F-18 FDG was injected intravenously. Full-ring PET imaging was performed from the skull base to thigh after the radiotracer. CT data was obtained and used for attenuation correction and anatomic localization.  FASTING BLOOD GLUCOSE:   Value: 106 mg/dl  COMPARISON:  CT 10/06/2014  FINDINGS: NECK  No hypermetabolic lymph nodes in the neck.  CHEST  There is hypermetabolic thickening through the distal esophagus with SUV max equal 8.9. Small paraesophageal lymph nodes have faint nonspecific activity (8 mm on image 107, series and 7 mm on image 113 series 4).  There is [name] focus of peripheral consolidation in the posterior aspect of the right lower lobe along the plueral surface which measures 14 mm and is not changed in size from 15 mm on CT of 10/06/2014. This lesion has very low metabolic activity. There is no additional hypermetabolic pulmonary nodules.  ABDOMEN/PELVIS  No abnormal hypermetabolic activity within the liver, pancreas, adrenal glands, or spleen. No hypermetabolic lymph nodes in the abdomen or pelvis.  SKELETON  No focal hypermetabolic activity to suggest skeletal metastasis.  IMPRESSION: 1. Intense metabolic activity through the distal esophagus consistent with primary carcinoma. 2. Two small paraesophageal lymph nodes are suspicious for local nodal metastasis. These have low metabolic activity. 3. No evidence of liver metastasis. 4. Peripheral nodule within the right lower lobe is not changed in size. These lesions have very low nonspecific metabolic activity. Favor [name] focus infection, inflammation, or infarction.   Electronically Signed   By: Suzy Bouchard M.D.   On: 10/23/2014 14:55  ADDENDUM REPORT: 12/31/2014 14:46  ADDENDUM: Hypermetabolic focus in PROSTATE gland localizes to the right mid gland peripheral zone and measures approximately 1 cm (image 195 of fused data set). Recommend correlation with PSA and if elevated recommend urology consultation.   Electronically Signed  By: Suzy Bouchard M.D.  On: 12/31/2014 14:46   I have independently reviewed the above radiologic studies.  Recent Lab Findings: Lab Results  Component Value Date   WBC 3.8* 12/30/2014   HGB 14.9 12/30/2014   HCT 43.9 12/30/2014    PLT 143 12/30/2014   GLUCOSE 94 11/26/2014   ALT 15 11/26/2014   AST 16 11/26/2014   NA 142 11/26/2014   K 4.1 11/26/2014   CL 110 08/20/2014   CREATININE 0.8 11/26/2014   BUN 19.0 11/26/2014   CO2 28 11/26/2014   INR 1.36 06/16/2009   PATH:[facility] Patient: David [name], David [name] Collected: 09/28/2014 Client: [facility] Accession: [id number] Diagnosis Surgical [P], distal esophagus mass - INVASIVE ADENOCARCINOMA IN [name] BACKGROUND OF [name]'S ESOPHAGUS.     Endoscopic findings: 1. Clearly malignant mass at the GE juntion. The mass appears non-circumferential by endocopy, occupying about 1/2-2/3 the circumference of the distal esophagus. This is creating an incomplete GE junction stricture, lumen through the stricture is 5-91mm and so I was unable to evaluate the distal aspect of the tumor. EUS findings: 1. The mass above was actually circumferntial by EUS, maximum thickness 1.4cm. The mass clear passes into and through the muscularis propria layer of the distal esophagus wall (uT3). 2. There were three suspicious (round, well demarcated, hypoechoic)  lymphnodes adjacent to the mass 9uN2). The largest lymphnode was 1cm across. ENDOSCOPIC IMPRESSION: OE7O3 GE junction adenocarcinoma (Stage IIIb). The mass is creating an incomplete stricture that was not able to be passed with the echoendoscope (lumen 5-43mm).   Assessment / Plan:    INVASIVE ADENOCARCINOMA OF THE DISTAL ESOPHAGUS IN [name] BACKGROUND OF [name]'S ESOPHAGUS,  clinical stage IIIB (uT3N2cM0)  [place] I,  primarily involving the distal esophagus and GE junction- I have discussed with the patient his wife and daughter the dx and indication for multimodality therapy including chem, radiation and surgical resection. I further discussed with the patient the surgical options and recommend that we proceed with resection oct 10   The patient has tolerated radiation and chemotherapy well, I recommended that we proceed with  transhiatal total esophagectomy with cervical esophagogastrostomy and placement of feeding jejunostomy tube.   I had [name] detailed discussion with Roswell Nickel regarding the magnitude of the surgical esophagectomy procedure as well as the risks, the expected benefits, and alternatives.  I quoted [place]well Nickel 5% perioperative mortality rate and [name] complication rate as high as 40%.  We specifically discussed complications, which include, but were not limited to: recurrent nerve injury with possible permanent hoarseness, anastomotic leak, airway and great vessel injury, conduit ischemia, thoracic duct leak, the inability to complete the operation via [name] transhiatal approach requiring [name] right thoracotomy,  bleeding, need for blood transfusion and the potential need for ventilator support.  Roswell Nickel has had questions answered is well informed and willing to proceed.     I  spent 30 minutes counseling the patient face to face and 50% or more the  time was spent in counseling and coordination of care. The total time spent in the appointment was 30minutes.  Grace Isaac MD      [address] [place] [address] Office [phone]   Beeper [phone]  01/28/2015 12:48 PM

## 2015-01-28 NOTE — Progress Notes (Signed)
PCP - Dr. Marton Redwood Cardiologist - Dr. Ena Dawley  EKG- 01/28/15 - Epic CXR - will need DOS  Echo/Stress Test/Cardiac Cath - pt. Denies  Patient denies shortness of breath and chest pain at PAT appointment.

## 2015-01-28 NOTE — Progress Notes (Signed)
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  Interval Since Last Radiation:  7  weeks   Radiation treatment dates extended from 11/02/2014-12/09/2014. The site and dose used included the involved segment of the esophagus and regional nodal basin was treated to 50.4 Gy in 28 fractions. The beams and energy used included IMRT was required to spare the heart, lungs and spinal cord with adequate target coverage. The patient was treated with IMRT using volumetric arc therapy delivering 6 MV X-rays to clockwise and counterclockwise circumferential arcs with a 90 degree collimator offset to avoid dose scalloping.  Image guidance was performed with daily cone beam CT prior to each fraction to align to his anatomy.  Immobilization was achieved with [employer] custom mold.  Narrative:  The patient returns today for his routine follow-up appointment with radiation oncology. His weight and vitals are stable. He denies symptoms pain or shortness of breath or fatigue. "I've had issues with my siatic nerve, but that's been off and on for a long time." The patient's surgery is to take place with Dr. Roxy Horseman on Monday (02/01/2015). The patient also denies any pain or difficulty associated with swallowing. He continues to walk one mile a day!  The patient projected a healthy mental status and was not accompanied by family for today's radiation oncology visit. "I'm eating well. I've been blessed," the patient stated.                    ALLERGIES:  is allergic to nexium; other; and statins.  Meds: Current Outpatient Prescriptions  Medication Sig Dispense Refill  . ALPRAZolam (XANAX) 0.5 MG tablet Take 0.5 mg by mouth at bedtime as needed  for sleep.   4  . amLODipine (NORVASC) 5 MG tablet Take 5 mg by mouth daily.  0  . aspirin 81 MG tablet Take 81 mg by mouth daily.    . benazepril (LOTENSIN) 40 MG tablet Take 40 mg by mouth daily.    . furosemide (LASIX) 40 MG tablet Take 40 mg by mouth daily.  3  . Liniments (DEEP BLUE RELIEF) GEL Apply 1 application topically daily as needed (FOR PAIN).    Marland Kitchen pantoprazole (PROTONIX) 40 MG tablet Take 1 tablet (40 mg total) by mouth daily. 90 tablet 3  . potassium citrate (UROCIT-K) 10 MEQ (1080 MG) SR tablet Take 10 mEq by mouth 3 (three) times daily with meals.    . Tamsulosin HCl (FLOMAX) 0.4 MG CAPS Take 0.4 mg by mouth daily after supper.     No current facility-administered medications for this encounter.    Physical Findings: The patient is in no acute distress. Patient is alert and oriented. There is no significant changes to the status of overall health to be noted at this time.  weight is 242 lb 14.4 oz (110.179 kg). His blood pressure is 131/63 and his pulse is 85. His respiration is 16 and oxygen saturation is 100%.   Lab Findings: Lab Results  Component Value Date   WBC 3.8* 12/30/2014   WBC 8.9 08/20/2014   HGB 14.9 12/30/2014   HGB 16.7 08/20/2014   HCT 43.9 12/30/2014   HCT 49.3 08/20/2014   PLT 143 12/30/2014  PLT 189 08/20/2014    Lab Results  Component Value Date   NA 142 11/26/2014   NA 145 08/20/2014   K 4.1 11/26/2014   K 3.9 08/20/2014   CHLORIDE 108 11/26/2014   CO2 28 11/26/2014   CO2 26 08/20/2014   GLUCOSE 94 11/26/2014   GLUCOSE 105* 08/20/2014   BUN 19.0 11/26/2014   BUN 16 10/02/2014   CREATININE 0.8 11/26/2014   CREATININE 0.92 10/02/2014   BILITOT 0.93 11/26/2014   BILITOT 0.7 08/20/2014   ALKPHOS 60 11/26/2014   ALKPHOS 73 08/20/2014   AST 16 11/26/2014   AST 23 08/20/2014   ALT 15 11/26/2014   ALT 19 08/20/2014   PROT 6.0* 11/26/2014   PROT 7.4 08/20/2014   ALBUMIN 3.4* 11/26/2014   ALBUMIN 4.2 08/20/2014   CALCIUM 8.8  11/26/2014   CALCIUM 9.2 08/20/2014   ANIONGAP 7 11/26/2014   ANIONGAP 9 08/20/2014    Radiographic Findings: Ct Chest W Contrast  12/31/2014   CLINICAL DATA:  Carcinoma of the lower third of the esophagus. Preoperative assessment.  EXAM: CT CHEST AND ABDOMEN WITHOUT CONTRAST  TECHNIQUE: Multidetector CT imaging of the chest and abdomen was performed following the standard protocol without intravenous contrast.  COMPARISON:  10/23/2014  FINDINGS: CT CHEST FINDINGS  Mediastinum/Nodes: Left supraclavicular lymph node 7 mm in short axis on image 11 series 3, previously present on the PET-CT and not visibly hypermetabolic.  Aortic arch atherosclerosis.  Calcified mitral valve.  Distal esophageal wall thickening just above the hiatus. Adjacent lymph nodes include a 9 mm short axis node on image 53 series 3 and a 6 mm node on image 55 series 3, concerning due to their proximity to the mass. 5 mm short axis lymph node anterior to the aorta along image 49 series 3.  Lungs/Pleura: 5 mm in width nodule in the right lower lobe medially on image 57 of series 7. This is in a location rib pre but frequently corresponding to atelectasis, but is persistent from 10/06/2014.  Scarring or subsegmental atelectasis in the posterior basal segment right lower lobe. Pleural-based nodule on the right side measures 8 mm in thickness and has a faint surrounding halo of ground-glass opacity. This is similar to the prior exam and adjacent to some mild irregularity of the right posterior eighth and ninth ribs probably representing old rib fractures.  0.7 by 0.5 cm pulmonary nodule posterior to the right bronchus intermedius on image 34 series 7, stable but more sharply defined on today's exam due to slice selection.  Musculoskeletal: Lower cervical and thoracic spondylosis. Old right posterior rib deformities adjacent to the pleural thickening as noted above.  CT ABDOMEN FINDINGS  Hepatobiliary: Unremarkable  Pancreas: Unremarkable   Spleen: Unremarkable  Adrenals/Urinary Tract: Hypodense 8 mm medial right mid kidney lesion is technically too small to characterize.  6 mm in long axis right UPJ stone with surrounding thickening and enhancement of the collecting system, and borderline hydronephrosis. 2 mm left kidney lower pole nonobstructive calculus. 6 mm hypodense lesion of the left kidney upper pole, likely a cyst but technically too small to characterize.  Stomach/Bowel: Unremarkable  Vascular/Lymphatic: No pathologic gastrohepatic ligament or porta hepatis adenopathy. Aortoiliac atherosclerotic vascular disease.  Other: No supplemental non-categorized findings.  Musculoskeletal: Lumbar spondylosis and degenerative disc disease. Large Schmorl's node along the inferior endplate of L4 with surrounding sclerosis likely from degenerative endplate findings. Similar Schmorl's node along the superior endplate of L5. Transitional S1 vertebra. Spondylosis and degenerative disc disease cause  multilevel impingement in the lumbar spine.  IMPRESSION: 1. Abnormal distal esophageal wall thickening is present compatible with esophageal cancer. 2 adjacent lymph nodes are probably involved. 2. Several right lung nodules if in the 5-6 mm range merit observation. These were present but not hypermetabolic on prior PET-CT, but below sensitive PET-CT size thresholds. 3. Pleural-based nodularity measuring 8 mm in thickness along the right posterior hemithorax was not previously hypermetabolic, and is adjacent to some old rib fractures -this probably is a residua from the prior rib fractures but likewise merit some observation. 4. 6 mm in long axis right UPJ stone with surrounding mucosal thickening/enhancement due to irritation, and borderline right hydronephrosis. There is also a 2 mm left kidney lower pole nonobstructive calculus. 5. Other imaging findings of potential clinical significance: Atherosclerosis. Calcified mitral valve. Spondylosis and degenerative  disc disease causing multilevel lumbar impingement.   Electronically Signed   By: Van Clines M.D.   On: 12/31/2014 14:31   Ct Abdomen W Contrast  12/31/2014   CLINICAL DATA:  Carcinoma of the lower third of the esophagus. Preoperative assessment.  EXAM: CT CHEST AND ABDOMEN WITHOUT CONTRAST  TECHNIQUE: Multidetector CT imaging of the chest and abdomen was performed following the standard protocol without intravenous contrast.  COMPARISON:  10/23/2014  FINDINGS: CT CHEST FINDINGS  Mediastinum/Nodes: Left supraclavicular lymph node 7 mm in short axis on image 11 series 3, previously present on the PET-CT and not visibly hypermetabolic.  Aortic arch atherosclerosis.  Calcified mitral valve.  Distal esophageal wall thickening just above the hiatus. Adjacent lymph nodes include a 9 mm short axis node on image 53 series 3 and a 6 mm node on image 55 series 3, concerning due to their proximity to the mass. 5 mm short axis lymph node anterior to the aorta along image 49 series 3.  Lungs/Pleura: 5 mm in width nodule in the right lower lobe medially on image 57 of series 7. This is in a location rib pre but frequently corresponding to atelectasis, but is persistent from 10/06/2014.  Scarring or subsegmental atelectasis in the posterior basal segment right lower lobe. Pleural-based nodule on the right side measures 8 mm in thickness and has a faint surrounding halo of ground-glass opacity. This is similar to the prior exam and adjacent to some mild irregularity of the right posterior eighth and ninth ribs probably representing old rib fractures.  0.7 by 0.5 cm pulmonary nodule posterior to the right bronchus intermedius on image 34 series 7, stable but more sharply defined on today's exam due to slice selection.  Musculoskeletal: Lower cervical and thoracic spondylosis. Old right posterior rib deformities adjacent to the pleural thickening as noted above.  CT ABDOMEN FINDINGS  Hepatobiliary: Unremarkable  Pancreas:  Unremarkable  Spleen: Unremarkable  Adrenals/Urinary Tract: Hypodense 8 mm medial right mid kidney lesion is technically too small to characterize.  6 mm in long axis right UPJ stone with surrounding thickening and enhancement of the collecting system, and borderline hydronephrosis. 2 mm left kidney lower pole nonobstructive calculus. 6 mm hypodense lesion of the left kidney upper pole, likely a cyst but technically too small to characterize.  Stomach/Bowel: Unremarkable  Vascular/Lymphatic: No pathologic gastrohepatic ligament or porta hepatis adenopathy. Aortoiliac atherosclerotic vascular disease.  Other: No supplemental non-categorized findings.  Musculoskeletal: Lumbar spondylosis and degenerative disc disease. Large Schmorl's node along the inferior endplate of L4 with surrounding sclerosis likely from degenerative endplate findings. Similar Schmorl's node along the superior endplate of L5. Transitional S1 vertebra. Spondylosis and  degenerative disc disease cause multilevel impingement in the lumbar spine.  IMPRESSION: 1. Abnormal distal esophageal wall thickening is present compatible with esophageal cancer. 2 adjacent lymph nodes are probably involved. 2. Several right lung nodules if in the 5-6 mm range merit observation. These were present but not hypermetabolic on prior PET-CT, but below sensitive PET-CT size thresholds. 3. Pleural-based nodularity measuring 8 mm in thickness along the right posterior hemithorax was not previously hypermetabolic, and is adjacent to some old rib fractures -this probably is a residua from the prior rib fractures but likewise merit some observation. 4. 6 mm in long axis right UPJ stone with surrounding mucosal thickening/enhancement due to irritation, and borderline right hydronephrosis. There is also a 2 mm left kidney lower pole nonobstructive calculus. 5. Other imaging findings of potential clinical significance: Atherosclerosis. Calcified mitral valve. Spondylosis and  degenerative disc disease causing multilevel lumbar impingement.   Electronically Signed   By: Van Clines M.D.   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 The patient is recovering from the effects of radiation. He has surgery on Monday (02/01/2015).  The patient understands that he can access his appointments and medical records via [facility].   Plan:  He will follow-up with radiation oncology after surgery is completed in three months time.    This document serves as a record of services personally performed by Tyler Pita, MD. It was created on his behalf by Lenn Cal, a trained medical scribe. The creation of this record is based on the scribe's personal observations and the provider's statements to them. This document has been checked and approved by the attending provider.  _____________________________________  Sheral Apley. Tammi Klippel, M.D.

## 2015-01-29 NOTE — Progress Notes (Signed)
Anesthesia Chart Review:  Pt is 74 year old male scheduled for complete transhiatal total esophagectomy on 02/01/2015 with Dr. Servando Snare.   PMH includes: HTN, dysrhythmia, esophageal cancer, GERD. Former smoker. BMI 30.   Medications include: amlodipine, ASA, benazepril, lasix, protonix, potassium.   Preoperative labs reviewed.    Chest x-ray 08/20/2014 reviewed. Right lower lobe nodularity with differential including nodular airspace disease / pneumonia versus pulmonary nodules. Recommend follow-up radiographs following appropriate therapy for pneumonia. If patient does not have symptoms of pneumonia, a more immediate CT of thorax could be considered.  EKG 01/28/2015: NSR. Cannot rule out Inferior infarct, age undetermined. Cannot rule out Anterior infarct, age undetermined  Pt saw Dr. Ena Dawley 12/11/2014 and has cardiac clearance for surgery.   If no changes, I anticipate pt can proceed with surgery as scheduled.   Willeen Cass, FNP-BC [facility]/Anesthesiology Phone: [phone] 01/29/2015 2:10 PM

## 2015-02-01 ENCOUNTER — Encounter (HOSPITAL_COMMUNITY)
Admission: RE | Disposition: A | Discharge status: Home / Self Care | Payer: Self-pay | Source: Ambulatory Visit | Attending: Cardiothoracic Surgery

## 2015-02-01 ENCOUNTER — Inpatient Hospital Stay (HOSPITAL_COMMUNITY): Payer: Medicare Other | Admitting: Emergency Medicine

## 2015-02-01 ENCOUNTER — Inpatient Hospital Stay (HOSPITAL_COMMUNITY): Payer: Medicare Other

## 2015-02-01 ENCOUNTER — Encounter (HOSPITAL_COMMUNITY): Payer: Self-pay | Admitting: Certified Registered Nurse Anesthetist

## 2015-02-01 ENCOUNTER — Inpatient Hospital Stay (HOSPITAL_COMMUNITY)
Admission: RE | Admit: 2015-02-01 | Discharge: 2015-02-12 | DRG: 327 | Disposition: A | Discharge status: Home / Self Care | Payer: Medicare Other | Source: Ambulatory Visit | Attending: Cardiothoracic Surgery | Admitting: Cardiothoracic Surgery

## 2015-02-01 ENCOUNTER — Inpatient Hospital Stay (HOSPITAL_COMMUNITY): Payer: Medicare Other | Admitting: Anesthesiology

## 2015-02-01 DIAGNOSIS — K219 Gastro-esophageal reflux disease without esophagitis: Secondary | ICD-10-CM | POA: Diagnosis present

## 2015-02-01 DIAGNOSIS — F419 Anxiety disorder, unspecified: Secondary | ICD-10-CM | POA: Diagnosis present

## 2015-02-01 DIAGNOSIS — L270 Generalized skin eruption due to drugs and medicaments taken internally: Secondary | ICD-10-CM | POA: Diagnosis not present

## 2015-02-01 DIAGNOSIS — N400 Enlarged prostate without lower urinary tract symptoms: Secondary | ICD-10-CM | POA: Diagnosis present

## 2015-02-01 DIAGNOSIS — G4700 Insomnia, unspecified: Secondary | ICD-10-CM | POA: Diagnosis not present

## 2015-02-01 DIAGNOSIS — C160 Malignant neoplasm of cardia: Secondary | ICD-10-CM | POA: Diagnosis not present

## 2015-02-01 DIAGNOSIS — Z01818 Encounter for other preprocedural examination: Secondary | ICD-10-CM | POA: Diagnosis not present

## 2015-02-01 DIAGNOSIS — C159 Malignant neoplasm of esophagus, unspecified: Secondary | ICD-10-CM | POA: Diagnosis not present

## 2015-02-01 DIAGNOSIS — C155 Malignant neoplasm of lower third of esophagus: Principal | ICD-10-CM | POA: Diagnosis present

## 2015-02-01 DIAGNOSIS — Y92230 Patient room in hospital as the place of occurrence of the external cause: Secondary | ICD-10-CM | POA: Diagnosis not present

## 2015-02-01 DIAGNOSIS — M109 Gout, unspecified: Secondary | ICD-10-CM | POA: Diagnosis not present

## 2015-02-01 DIAGNOSIS — I10 Essential (primary) hypertension: Secondary | ICD-10-CM | POA: Diagnosis present

## 2015-02-01 DIAGNOSIS — T404X5A Adverse effect of other synthetic narcotics, initial encounter: Secondary | ICD-10-CM | POA: Diagnosis not present

## 2015-02-01 DIAGNOSIS — R197 Diarrhea, unspecified: Secondary | ICD-10-CM | POA: Diagnosis not present

## 2015-02-01 DIAGNOSIS — I493 Ventricular premature depolarization: Secondary | ICD-10-CM | POA: Diagnosis not present

## 2015-02-01 DIAGNOSIS — Z9049 Acquired absence of other specified parts of digestive tract: Secondary | ICD-10-CM

## 2015-02-01 DIAGNOSIS — M1990 Unspecified osteoarthritis, unspecified site: Secondary | ICD-10-CM | POA: Diagnosis not present

## 2015-02-01 DIAGNOSIS — Z9889 Other specified postprocedural states: Secondary | ICD-10-CM | POA: Diagnosis not present

## 2015-02-01 DIAGNOSIS — C771 Secondary and unspecified malignant neoplasm of intrathoracic lymph nodes: Secondary | ICD-10-CM | POA: Diagnosis present

## 2015-02-01 DIAGNOSIS — D62 Acute posthemorrhagic anemia: Secondary | ICD-10-CM | POA: Diagnosis not present

## 2015-02-01 DIAGNOSIS — J9811 Atelectasis: Secondary | ICD-10-CM | POA: Diagnosis not present

## 2015-02-01 DIAGNOSIS — Z9689 Presence of other specified functional implants: Secondary | ICD-10-CM

## 2015-02-01 DIAGNOSIS — Z903 Acquired absence of stomach [part of]: Secondary | ICD-10-CM | POA: Diagnosis not present

## 2015-02-01 HISTORY — PX: COMPLETE ESOPHAGECTOMY: SHX5286

## 2015-02-01 HISTORY — PX: PYLOROMYOTOMY: SHX5274

## 2015-02-01 HISTORY — PX: JEJUNOSTOMY: SHX313

## 2015-02-01 HISTORY — PX: ESOPHAGOGASTROSTOMY: SHX5238

## 2015-02-01 LAB — CBC
HEMATOCRIT: 36.9 % — AB (ref 39.0–52.0)
HEMOGLOBIN: 12.9 g/dL — AB (ref 13.0–17.0)
MCH: 34.1 pg — ABNORMAL HIGH (ref 26.0–34.0)
MCHC: 35 g/dL (ref 30.0–36.0)
MCV: 97.6 fL (ref 78.0–100.0)
Platelets: 124 10*3/uL — ABNORMAL LOW (ref 150–400)
RBC: 3.78 MIL/uL — ABNORMAL LOW (ref 4.22–5.81)
RDW: 14 % (ref 11.5–15.5)
WBC: 8.3 10*3/uL (ref 4.0–10.5)

## 2015-02-01 LAB — POCT I-STAT 4, (NA,K, GLUC, HGB,HCT)
Glucose, Bld: 139 mg/dL — ABNORMAL HIGH (ref 65–99)
HCT: 36 % — ABNORMAL LOW (ref 39.0–52.0)
Hemoglobin: 12.2 g/dL — ABNORMAL LOW (ref 13.0–17.0)
Potassium: 4 mmol/L (ref 3.5–5.1)
Sodium: 138 mmol/L (ref 135–145)

## 2015-02-01 LAB — POCT I-STAT 3, ART BLOOD GAS (G3+)
Acid-Base Excess: 1 mmol/L (ref 0.0–2.0)
Acid-base deficit: 1 mmol/L (ref 0.0–2.0)
Bicarbonate: 24.7 mEq/L — ABNORMAL HIGH (ref 20.0–24.0)
Bicarbonate: 24 mEq/L (ref 20.0–24.0)
O2 Saturation: 96 %
O2 Saturation: 95 %
Patient temperature: 98
Patient temperature: 98.1
TCO2: 26 mmol/L (ref 0–100)
TCO2: 25 mmol/L (ref 0–100)
pCO2 arterial: 36.5 mmHg (ref 35.0–45.0)
pCO2 arterial: 38.7 mmHg (ref 35.0–45.0)
pH, Arterial: 7.437 (ref 7.350–7.450)
pH, Arterial: 7.4 (ref 7.350–7.450)
pO2, Arterial: 77 mmHg — ABNORMAL LOW (ref 80.0–100.0)
pO2, Arterial: 76 mmHg — ABNORMAL LOW (ref 80.0–100.0)

## 2015-02-01 LAB — BASIC METABOLIC PANEL
ANION GAP: 8 (ref 5–15)
BUN: 12 mg/dL (ref 6–20)
CALCIUM: 7.9 mg/dL — AB (ref 8.9–10.3)
CHLORIDE: 101 mmol/L (ref 101–111)
CO2: 25 mmol/L (ref 22–32)
Creatinine, Ser: 0.88 mg/dL (ref 0.61–1.24)
GFR calc non Af Amer: >60 mL/min (ref >60)
Glucose, Bld: 140 mg/dL — ABNORMAL HIGH (ref 65–99)
Potassium: 3.9 mmol/L (ref 3.5–5.1)
SODIUM: 134 mmol/L — AB (ref 135–145)

## 2015-02-01 LAB — PREPARE RBC (CROSSMATCH)

## 2015-02-01 SURGERY — ESOPHAGECTOMY, TOTAL
Anesthesia: General | Site: Abdomen

## 2015-02-01 MED ORDER — 0.9 % SODIUM CHLORIDE (POUR BTL) OPTIME
TOPICAL | Status: DC | PRN
Start: 2015-02-01 — End: 2015-02-01
  Administered 2015-02-01: 2000 mL

## 2015-02-01 MED ORDER — LACTATED RINGERS IV SOLN
INTRAVENOUS | Status: DC | PRN
  Administered 2015-02-01: 07:00:00 via INTRAVENOUS

## 2015-02-01 MED ORDER — ALBUMIN HUMAN 5 % IV SOLN
INTRAVENOUS | Status: DC | PRN
  Administered 2015-02-01 (×2): via INTRAVENOUS

## 2015-02-01 MED ORDER — DIPHENHYDRAMINE HCL 50 MG/ML IJ SOLN
12.5000 mg | Freq: Four times a day (QID) | INTRAMUSCULAR | Status: DC | PRN
  Administered 2015-02-02 – 2015-02-03 (×4): 12.5 mg via INTRAVENOUS
  Filled 2015-02-01 (×4): qty 1

## 2015-02-01 MED ORDER — SUCCINYLCHOLINE CHLORIDE 20 MG/ML IJ SOLN
INTRAMUSCULAR | Status: AC
  Filled 2015-02-01: qty 1

## 2015-02-01 MED ORDER — LIDOCAINE HCL (CARDIAC) 20 MG/ML IV SOLN
INTRAVENOUS | Status: AC
  Filled 2015-02-01: qty 5

## 2015-02-01 MED ORDER — MIDAZOLAM HCL 2 MG/2ML IJ SOLN
INTRAMUSCULAR | Status: AC
  Filled 2015-02-01: qty 4

## 2015-02-01 MED ORDER — DEXTROSE 5 % IV SOLN
2.0000 g | Freq: Four times a day (QID) | INTRAVENOUS | Status: AC
  Administered 2015-02-01 – 2015-02-02 (×4): 2 g via INTRAVENOUS
  Filled 2015-02-01 (×4): qty 2

## 2015-02-01 MED ORDER — DEXMEDETOMIDINE HCL IN NACL 200 MCG/50ML IV SOLN
INTRAVENOUS | Status: DC | PRN
  Administered 2015-02-01: .5 ug/kg/h via INTRAVENOUS

## 2015-02-01 MED ORDER — OXYCODONE HCL 5 MG PO TABS: 5.0000 mg | ORAL_TABLET | Freq: Once | ORAL | Status: DC | PRN

## 2015-02-01 MED ORDER — ANTISEPTIC ORAL RINSE SOLUTION (CORINZ)
7.0000 mL | Freq: Four times a day (QID) | OROMUCOSAL | Status: DC
  Administered 2015-02-01 – 2015-02-08 (×18): 7 mL via OROMUCOSAL

## 2015-02-01 MED ORDER — KCL IN DEXTROSE-NACL 20-5-0.45 MEQ/L-%-% IV SOLN
INTRAVENOUS | Status: DC
  Administered 2015-02-01 – 2015-02-02 (×3): via INTRAVENOUS
  Administered 2015-02-02: 125 mL/h via INTRAVENOUS
  Administered 2015-02-03 – 2015-02-05 (×6): via INTRAVENOUS
  Administered 2015-02-05: 1000 mL via INTRAVENOUS
  Administered 2015-02-06 – 2015-02-10 (×2): via INTRAVENOUS
  Filled 2015-02-01 (×20): qty 1000

## 2015-02-01 MED ORDER — OXYCODONE HCL 5 MG/5ML PO SOLN: 5.0000 mg | Freq: Once | ORAL | Status: DC | PRN

## 2015-02-01 MED ORDER — MEPERIDINE HCL 25 MG/ML IJ SOLN: 6.2500 mg | INTRAMUSCULAR | Status: DC | PRN

## 2015-02-01 MED ORDER — FENTANYL CITRATE (PF) 250 MCG/5ML IJ SOLN
INTRAMUSCULAR | Status: AC
  Filled 2015-02-01: qty 5

## 2015-02-01 MED ORDER — DEXTROSE 5 % IV SOLN
2.0000 g | Freq: Once | INTRAVENOUS | Status: AC
  Administered 2015-02-01 (×2): 2 g via INTRAVENOUS
  Filled 2015-02-01 (×2): qty 2

## 2015-02-01 MED ORDER — DEXTROSE 5 % IV SOLN: 2.0000 g | Freq: Four times a day (QID) | INTRAVENOUS | Status: DC

## 2015-02-01 MED ORDER — DIPHENHYDRAMINE HCL 12.5 MG/5ML PO ELIX
12.5000 mg | ORAL_SOLUTION | Freq: Four times a day (QID) | ORAL | Status: DC | PRN
  Filled 2015-02-01: qty 5

## 2015-02-01 MED ORDER — ONDANSETRON HCL 4 MG/2ML IJ SOLN: 4.0000 mg | Freq: Four times a day (QID) | INTRAMUSCULAR | Status: DC | PRN

## 2015-02-01 MED ORDER — CHLORHEXIDINE GLUCONATE CLOTH 2 % EX PADS
6.0000 | MEDICATED_PAD | Freq: Every day | CUTANEOUS | Status: AC
  Administered 2015-02-01 – 2015-02-06 (×4): 6 via TOPICAL

## 2015-02-01 MED ORDER — SODIUM CHLORIDE 0.9 % IJ SOLN: 3.0000 mL | INTRAMUSCULAR | Status: DC | PRN

## 2015-02-01 MED ORDER — ROCURONIUM BROMIDE 100 MG/10ML IV SOLN
INTRAVENOUS | Status: DC | PRN
  Administered 2015-02-01: 50 mg via INTRAVENOUS
  Administered 2015-02-01: 20 mg via INTRAVENOUS
  Administered 2015-02-01: 10 mg via INTRAVENOUS
  Administered 2015-02-01: 30 mg via INTRAVENOUS
  Administered 2015-02-01 (×2): 20 mg via INTRAVENOUS

## 2015-02-01 MED ORDER — SUCCINYLCHOLINE CHLORIDE 200 MG/10ML IV SOSY
PREFILLED_SYRINGE | INTRAVENOUS | Status: DC | PRN
  Administered 2015-02-01: 120 mg via INTRAVENOUS

## 2015-02-01 MED ORDER — PROPOFOL 10 MG/ML IV BOLUS
INTRAVENOUS | Status: DC | PRN
  Administered 2015-02-01: 200 mg via INTRAVENOUS

## 2015-02-01 MED ORDER — SODIUM CHLORIDE 0.9 % IJ SOLN
3.0000 mL | Freq: Two times a day (BID) | INTRAMUSCULAR | Status: DC
  Administered 2015-02-01: 6 mL via INTRAVENOUS
  Administered 2015-02-02 – 2015-02-03 (×3): 3 mL via INTRAVENOUS

## 2015-02-01 MED ORDER — SODIUM CHLORIDE 0.9 % IJ SOLN: 9.0000 mL | INTRAMUSCULAR | Status: DC | PRN

## 2015-02-01 MED ORDER — NALOXONE HCL 0.4 MG/ML IJ SOLN: 0.4000 mg | INTRAMUSCULAR | Status: DC | PRN

## 2015-02-01 MED ORDER — ARTIFICIAL TEARS OP OINT
TOPICAL_OINTMENT | OPHTHALMIC | Status: DC | PRN
  Administered 2015-02-01: 1 via OPHTHALMIC

## 2015-02-01 MED ORDER — PNEUMOCOCCAL VAC POLYVALENT 25 MCG/0.5ML IJ INJ
0.5000 mL | INJECTION | INTRAMUSCULAR | Status: DC | PRN
  Filled 2015-02-01: qty 0.5

## 2015-02-01 MED ORDER — INFLUENZA VAC SPLIT QUAD 0.5 ML IM SUSY
0.5000 mL | PREFILLED_SYRINGE | INTRAMUSCULAR | Status: DC | PRN
  Filled 2015-02-01: qty 0.5

## 2015-02-01 MED ORDER — PHENYLEPHRINE HCL 10 MG/ML IJ SOLN
10.0000 mg | INTRAVENOUS | Status: DC | PRN
  Administered 2015-02-01: 20 ug/min via INTRAVENOUS

## 2015-02-01 MED ORDER — ARTIFICIAL TEARS OP OINT
TOPICAL_OINTMENT | OPHTHALMIC | Status: AC
  Filled 2015-02-01: qty 3.5

## 2015-02-01 MED ORDER — HYDROMORPHONE HCL 1 MG/ML IJ SOLN: 0.2500 mg | INTRAMUSCULAR | Status: DC | PRN

## 2015-02-01 MED ORDER — POTASSIUM CHLORIDE 10 MEQ/50ML IV SOLN: 10.0000 meq | INTRAVENOUS | Status: DC | PRN

## 2015-02-01 MED ORDER — DEXTROSE 5 % IV SOLN
INTRAVENOUS | Status: AC
  Filled 2015-02-01: qty 1.5

## 2015-02-01 MED ORDER — DEXMEDETOMIDINE HCL IN NACL 200 MCG/50ML IV SOLN
INTRAVENOUS | Status: AC
  Filled 2015-02-01: qty 50

## 2015-02-01 MED ORDER — EPHEDRINE SULFATE 50 MG/ML IJ SOLN
INTRAMUSCULAR | Status: AC
  Filled 2015-02-01: qty 1

## 2015-02-01 MED ORDER — LACTATED RINGERS IV SOLN: INTRAVENOUS | Status: DC

## 2015-02-01 MED ORDER — PHENYLEPHRINE 40 MCG/ML (10ML) SYRINGE FOR IV PUSH (FOR BLOOD PRESSURE SUPPORT)
PREFILLED_SYRINGE | INTRAVENOUS | Status: AC
  Filled 2015-02-01: qty 10

## 2015-02-01 MED ORDER — DEXTROSE 5 % IV SOLN
2.0000 g | Freq: Once | INTRAVENOUS | Status: DC
  Filled 2015-02-01: qty 2

## 2015-02-01 MED ORDER — ALBUTEROL SULFATE (2.5 MG/3ML) 0.083% IN NEBU: 2.5000 mg | INHALATION_SOLUTION | Freq: Four times a day (QID) | RESPIRATORY_TRACT | Status: AC | PRN

## 2015-02-01 MED ORDER — EPHEDRINE SULFATE 50 MG/ML IJ SOLN
INTRAMUSCULAR | Status: DC | PRN
  Administered 2015-02-01: 5 mg via INTRAVENOUS
  Administered 2015-02-01: 2.5 mg via INTRAVENOUS

## 2015-02-01 MED ORDER — DEXTROSE 5 % IV SOLN: 1.5000 g | INTRAVENOUS | Status: DC

## 2015-02-01 MED ORDER — PROPOFOL 10 MG/ML IV BOLUS
INTRAVENOUS | Status: AC
  Filled 2015-02-01: qty 20

## 2015-02-01 MED ORDER — ONDANSETRON HCL 4 MG/2ML IJ SOLN
4.0000 mg | INTRAMUSCULAR | Status: DC | PRN
Start: 2015-02-01 — End: 2015-02-01

## 2015-02-01 MED ORDER — MUPIROCIN 2 % EX OINT
TOPICAL_OINTMENT | Freq: Two times a day (BID) | CUTANEOUS | Status: AC
  Administered 2015-02-01: 1 via NASAL
  Administered 2015-02-02 (×2): via NASAL
  Filled 2015-02-01: qty 22

## 2015-02-01 MED ORDER — DEXMEDETOMIDINE HCL IN NACL 200 MCG/50ML IV SOLN
0.7000 ug/kg/h | INTRAVENOUS | Status: DC
  Administered 2015-02-01: 0.7 ug/kg/h via INTRAVENOUS
  Administered 2015-02-02: 0.4 ug/kg/h via INTRAVENOUS
  Filled 2015-02-01 (×2): qty 50

## 2015-02-01 MED ORDER — ROCURONIUM BROMIDE 50 MG/5ML IV SOLN
INTRAVENOUS | Status: AC
  Filled 2015-02-01: qty 1

## 2015-02-01 MED ORDER — FENTANYL 10 MCG/ML IV SOLN
INTRAVENOUS | Status: DC
  Administered 2015-02-01 – 2015-02-02 (×2): via INTRAVENOUS
  Administered 2015-02-02: 312.7 ug via INTRAVENOUS
  Administered 2015-02-02: 225 ug via INTRAVENOUS
  Administered 2015-02-02 (×2): via INTRAVENOUS
  Administered 2015-02-02: 432.5 ug via INTRAVENOUS
  Administered 2015-02-02: 210 ug via INTRAVENOUS
  Administered 2015-02-03: 13:00:00 via INTRAVENOUS
  Administered 2015-02-03: 180 ug via INTRAVENOUS
  Administered 2015-02-03: 255 ug via INTRAVENOUS
  Administered 2015-02-03: 245 ug via INTRAVENOUS
  Administered 2015-02-03: 375 ug via INTRAVENOUS
  Administered 2015-02-03: 22:00:00 via INTRAVENOUS
  Administered 2015-02-03: 245 ug via INTRAVENOUS
  Administered 2015-02-03: 180 ug via INTRAVENOUS
  Administered 2015-02-03: 06:00:00 via INTRAVENOUS
  Administered 2015-02-04: 270 ug via INTRAVENOUS
  Administered 2015-02-04: 200 ug via INTRAVENOUS
  Filled 2015-02-01 (×7): qty 50

## 2015-02-01 MED ORDER — MIDAZOLAM HCL 5 MG/5ML IJ SOLN
INTRAMUSCULAR | Status: DC | PRN
  Administered 2015-02-01 (×2): 1 mg via INTRAVENOUS

## 2015-02-01 MED ORDER — SODIUM CHLORIDE 0.9 % IV SOLN: Freq: Once | INTRAVENOUS | Status: DC

## 2015-02-01 MED ORDER — CHLORHEXIDINE GLUCONATE 0.12 % MT SOLN
15.0000 mL | Freq: Two times a day (BID) | OROMUCOSAL | Status: DC
  Administered 2015-02-01 – 2015-02-08 (×10): 15 mL via OROMUCOSAL
  Filled 2015-02-01 (×12): qty 15

## 2015-02-01 MED ORDER — PANTOPRAZOLE SODIUM 40 MG IV SOLR
40.0000 mg | Freq: Two times a day (BID) | INTRAVENOUS | Status: DC
  Administered 2015-02-01 – 2015-02-11 (×21): 40 mg via INTRAVENOUS
  Filled 2015-02-01 (×27): qty 40

## 2015-02-01 MED ORDER — ONDANSETRON HCL 4 MG/2ML IJ SOLN
INTRAMUSCULAR | Status: AC
  Filled 2015-02-01: qty 2

## 2015-02-01 MED ORDER — LACTATED RINGERS IV SOLN
INTRAVENOUS | Status: DC | PRN
  Administered 2015-02-01 (×5): via INTRAVENOUS

## 2015-02-01 MED ORDER — SODIUM CHLORIDE 0.9 % IJ SOLN
10.0000 mL | Freq: Two times a day (BID) | INTRAMUSCULAR | Status: DC
  Administered 2015-02-02 – 2015-02-11 (×18): 10 mL via INTRAVENOUS

## 2015-02-01 MED ORDER — FENTANYL CITRATE (PF) 100 MCG/2ML IJ SOLN
INTRAMUSCULAR | Status: DC | PRN
  Administered 2015-02-01 (×13): 50 ug via INTRAVENOUS
  Administered 2015-02-01: 25 ug via INTRAVENOUS
  Administered 2015-02-01 (×3): 50 ug via INTRAVENOUS
  Administered 2015-02-01 (×2): 100 ug via INTRAVENOUS
  Administered 2015-02-01 (×2): 50 ug via INTRAVENOUS
  Administered 2015-02-01: 25 ug via INTRAVENOUS
  Administered 2015-02-01 (×2): 50 ug via INTRAVENOUS

## 2015-02-01 SURGICAL SUPPLY — 115 items
ADH SKN CLS APL DERMABOND .7 (GAUZE/BANDAGES/DRESSINGS) ×4
BANDAGE HEMOSTAT MRDH 4X4 STRL (MISCELLANEOUS) IMPLANT
BNDG HEMOSTAT MRDH 4X4 STRL (MISCELLANEOUS)
CANISTER SUCTION 2500CC (MISCELLANEOUS) ×6 IMPLANT
CATH FOLEY 2WAY SLVR 18FR 30CC (CATHETERS) ×3 IMPLANT
CATH ROBINSON RED A/P 18FR (CATHETERS) ×3 IMPLANT
CLIP FOGARTY SPRING 6M (CLIP) ×3 IMPLANT
CLIP TI MEDIUM 24 (CLIP) ×3 IMPLANT
CLIP TI WIDE RED SMALL 24 (CLIP) ×3 IMPLANT
CONT SPECI 4OZ STER CLIK (MISCELLANEOUS) ×1 IMPLANT
COVER SURGICAL LIGHT HANDLE (MISCELLANEOUS) ×3 IMPLANT
DERMABOND ADVANCED (GAUZE/BANDAGES/DRESSINGS) ×2
DERMABOND ADVANCED .7 DNX12 (GAUZE/BANDAGES/DRESSINGS) ×4 IMPLANT
DRAIN PENROSE 1/2X36 STERILE (WOUND CARE) ×3 IMPLANT
DRAIN SUMP SARATOGA 24F (WOUND CARE) ×3 IMPLANT
DRAPE BILATERAL SPLIT (DRAPES) ×3 IMPLANT
DRAPE CAMERA VIDEO/LASER (DRAPES) ×3 IMPLANT
DRAPE CV SPLIT W-CLR ANES SCRN (DRAPES) ×3 IMPLANT
DRAPE INCISE IOBAN 66X45 STRL (DRAPES) ×6 IMPLANT
DRAPE PROXIMA HALF (DRAPES) ×1 IMPLANT
DRAPE SLUSH/WARMER DISC (DRAPES) ×3 IMPLANT
DRSG AQUACEL AG ADV 3.5X10 (GAUZE/BANDAGES/DRESSINGS) ×1 IMPLANT
DRSG AQUACEL AG ADV 3.5X14 (GAUZE/BANDAGES/DRESSINGS) ×3 IMPLANT
ELECT BLADE 4.0 EZ CLEAN MEGAD (MISCELLANEOUS) ×3
ELECT BLADE 6.5 EXT (BLADE) ×3 IMPLANT
ELECT NDL TIP 2.8 STRL (NEEDLE) ×2 IMPLANT
ELECT NEEDLE TIP 2.8 STRL (NEEDLE) ×3 IMPLANT
ELECT REM PT RETURN 9FT ADLT (ELECTROSURGICAL) ×6
ELECTRODE BLDE 4.0 EZ CLN MEGD (MISCELLANEOUS) ×2 IMPLANT
ELECTRODE REM PT RTRN 9FT ADLT (ELECTROSURGICAL) ×4 IMPLANT
GAUZE SPONGE 4X4 12PLY STRL (GAUZE/BANDAGES/DRESSINGS) ×3 IMPLANT
GLOVE BIO SURGEON STRL SZ 6.5 (GLOVE) ×12 IMPLANT
GLOVE BIOGEL M STER SZ 6 (GLOVE) ×2 IMPLANT
GLOVE BIOGEL PI IND STRL 6 (GLOVE) IMPLANT
GLOVE BIOGEL PI IND STRL 6.5 (GLOVE) IMPLANT
GLOVE BIOGEL PI INDICATOR 6 (GLOVE) ×1
GLOVE BIOGEL PI INDICATOR 6.5 (GLOVE) ×1
GOWN STRL REUS W/ TWL LRG LVL3 (GOWN DISPOSABLE) ×6 IMPLANT
GOWN STRL REUS W/TWL LRG LVL3 (GOWN DISPOSABLE) ×24
HANDLE STAPLE ENDO GIA SHORT (STAPLE) ×1
HEMOSTAT SURGICEL 2X14 (HEMOSTASIS) IMPLANT
INSERT FOGARTY 61MM (MISCELLANEOUS) IMPLANT
KIT BASIN OR (CUSTOM PROCEDURE TRAY) ×3 IMPLANT
KIT ROOM TURNOVER OR (KITS) ×3 IMPLANT
KIT SUCTION CATH 14FR (SUCTIONS) ×3 IMPLANT
LIGASURE IMPACT 36 18CM CVD LR (INSTRUMENTS) ×3 IMPLANT
LOOP VESSEL MINI RED (MISCELLANEOUS) ×1 IMPLANT
NS IRRIG 1000ML POUR BTL (IV SOLUTION) ×6 IMPLANT
PACK CHEST (CUSTOM PROCEDURE TRAY) ×3 IMPLANT
PAD ARMBOARD 7.5X6 YLW CONV (MISCELLANEOUS) ×6 IMPLANT
PAD SHARPS MAGNETIC DISPOSAL (MISCELLANEOUS) ×3 IMPLANT
PENCIL BUTTON HOLSTER BLD 10FT (ELECTRODE) IMPLANT
PLUG CATH AND CAP STER (CATHETERS) ×3 IMPLANT
PROBE NERVBE PRASS .33 (MISCELLANEOUS) ×3 IMPLANT
PROBE PENCIL 8 MHZ STRL DISP (MISCELLANEOUS) ×3 IMPLANT
RELOAD EGIA 45 MED/THCK PURPLE (STAPLE) IMPLANT
RELOAD EGIA 45 TAN VASC (STAPLE) ×2 IMPLANT
RELOAD EGIA TRIS TAN 45 CVD (STAPLE) IMPLANT
RELOAD ENDO GIA 30 3.5 (STAPLE) IMPLANT
RELOAD LINEAR CUT PROX 55 BLUE (ENDOMECHANICALS) ×24 IMPLANT
RELOAD STAPLE 45 TAN MED CVD (STAPLE) IMPLANT
RELOAD STAPLE 55 3.8 BLU REG (ENDOMECHANICALS) ×6 IMPLANT
RETAINER VISCERA MED (MISCELLANEOUS) ×2 IMPLANT
RETRACTOR WND ALEXIS 25 LRG (MISCELLANEOUS) ×2 IMPLANT
RTRCTR WOUND ALEXIS 25CM LRG (MISCELLANEOUS) ×3
SHEARS HARMONIC ACE PLUS 36CM (ENDOMECHANICALS) ×1 IMPLANT
SPONGE LAP 18X18 X RAY DECT (DISPOSABLE) ×12 IMPLANT
SPONGE TONSIL 1 RF SGL (DISPOSABLE) ×1 IMPLANT
STAPLER ENDO GIA 12 SHRT THIN (STAPLE) ×2 IMPLANT
STAPLER ENDO GIA 12MM SHORT (STAPLE) ×2 IMPLANT
STAPLER PROXIMATE 55 BLUE (STAPLE) ×4 IMPLANT
STAPLER VISISTAT 35W (STAPLE) IMPLANT
SUCTION POOLE TIP (SUCTIONS) IMPLANT
SUT ETHILON 3 0 FSL (SUTURE) ×5 IMPLANT
SUT PDS AB 4-0 SH 27 (SUTURE) IMPLANT
SUT PROLENE 0 CT 1 CR/8 (SUTURE) IMPLANT
SUT PROLENE 1 XLH (SUTURE) ×3 IMPLANT
SUT PROLENE 2 0 MH 48 (SUTURE) IMPLANT
SUT PROLENE 2 TP 1 (SUTURE) ×6 IMPLANT
SUT PROLENE 3 0 RB 1 (SUTURE) IMPLANT
SUT PROLENE 3 0 SH DA (SUTURE) ×2 IMPLANT
SUT PROLENE 4 0 RB 1 (SUTURE)
SUT PROLENE 4-0 RB1 .5 CRCL 36 (SUTURE) IMPLANT
SUT SILK 1 TIES 10X30 (SUTURE) ×4 IMPLANT
SUT SILK 2 0 SH CR/8 (SUTURE) ×5 IMPLANT
SUT SILK 2 0SH CR/8 30 (SUTURE) ×3 IMPLANT
SUT SILK 3 0 SH CR/8 (SUTURE) IMPLANT
SUT SILK 3 0SH CR/8 30 (SUTURE) IMPLANT
SUT SILK 4 0 SH CR/8 (SUTURE) ×4 IMPLANT
SUT VIC AB 1 CTX 27 (SUTURE) IMPLANT
SUT VIC AB 2-0 CT1 18 (SUTURE) IMPLANT
SUT VIC AB 2-0 CTX 27 (SUTURE) ×1 IMPLANT
SUT VIC AB 2-0 CTX 36 (SUTURE) ×3 IMPLANT
SUT VIC AB 3-0 SH 18 (SUTURE) ×1 IMPLANT
SUT VIC AB 3-0 X1 27 (SUTURE) ×8 IMPLANT
SUT VIC AB 4-0 SH 18 (SUTURE) ×11 IMPLANT
SUT VICRYL 2 TP 1 (SUTURE) IMPLANT
SYR 20CC LL (SYRINGE) IMPLANT
SYR 30ML SLIP (SYRINGE) ×3 IMPLANT
SYR 5ML LUER SLIP (SYRINGE) ×3 IMPLANT
SYR TOOMEY 50ML (SYRINGE) ×3 IMPLANT
SYRINGE 10CC LL (SYRINGE) IMPLANT
SYS LAPSCP GELPORT 120MM (MISCELLANEOUS) ×3
SYSTEM LAPSCP GELPORT 120MM (MISCELLANEOUS) IMPLANT
SYSTEM SAHARA CHEST DRAIN RE-I (WOUND CARE) ×3 IMPLANT
TAPE UMBILICAL 1/8 X36 TWILL (MISCELLANEOUS) IMPLANT
TOWEL OR 17X24 6PK STRL BLUE (TOWEL DISPOSABLE) ×6 IMPLANT
TOWEL OR 17X26 10 PK STRL BLUE (TOWEL DISPOSABLE) ×6 IMPLANT
TRAY FOLEY CATH 16FRSI W/METER (SET/KITS/TRAYS/PACK) ×3 IMPLANT
TUBE CONNECTING 12X1/4 (SUCTIONS) ×2 IMPLANT
TUBE ENDOTRAC EMG 7X10.2 (MISCELLANEOUS) IMPLANT
TUBE ENDOTRAC EMG 8X11.3 (MISCELLANEOUS) IMPLANT
TUBE ENDOTRACH  EMG 6MMTUBE EN (MISCELLANEOUS)
TUBE ENDOTRACH EMG 6MMTUBE EN (MISCELLANEOUS) IMPLANT
WATER STERILE IRR 1000ML POUR (IV SOLUTION) ×3 IMPLANT

## 2015-02-01 NOTE — Brief Op Note (Addendum)
      [address]       [place] [address]             [phone]     02/01/2015  3:30 PM  PATIENT:  Roswell Nickel  74 y.o. male  PRE-OPERATIVE DIAGNOSIS:  Esophageal Cancer  POST-OPERATIVE DIAGNOSIS:  Esophageal Cancer  PROCEDURE:   TRANSHIATAL  ESOPHAGECTOMY   CERVICAL ESOPHAGOGASTROSTOMY PYLOROMYOTOMY FEEDING JEJUNOSTOMY Left chest tube  SURGEON:  Grace Isaac, MD  ASSISTANT: Suzzanne Cloud, PA-C  ANESTHESIA:   general  SPECIMEN:  Source of Specimen:  esophagus, lymph nodes  DISPOSITION OF SPECIMEN:  Pathology  DRAINS: 28 Fr Left chest tube, Penrose drain in left neck  PATIENT CONDITION:  ICU - intubated and hemodynamically stable.  No blood transfusion Foley, j tube, left chest tube

## 2015-02-01 NOTE — Plan of Care (Signed)
Hands mitted per MD order

## 2015-02-01 NOTE — Procedures (Signed)
Extubation Procedure Note  Patient Details:   Name: David [name] DOB: Aug 29, 1940 MRN: [id number]   Airway Documentation:  Airway 8 mm (Active)  Secured at (cm) 25 cm 02/01/2015  7:09 PM  Measured From Lips 02/01/2015  8:00 PM  Secured Location Right 02/01/2015  8:00 PM  Secured By Pink Tape 02/01/2015  8:00 PM  Site Condition Dry;Cool 02/01/2015  8:00 PM    Evaluation  O2 sats: stable throughout Complications: No apparent complications Patient did tolerate procedure well. Bilateral Breath Sounds: Clear, Diminished Suctioning: Airway Yes   Patient extubated without any complications. Patient has strong cough and is able to speak. Patient placed on 4LNC and sats are 94%.  Blanchie Serve 02/01/2015, 9:04 PM

## 2015-02-01 NOTE — Progress Notes (Signed)
Patient ID: LEDGER HEINDL, male   DOB: 10-07-1940, 74 y.o.   MRN: [id number] EVENING ROUNDS NOTE :     [address]       [place] [address]             [phone]                 Day of Surgery Procedure(s) (LRB): TRANSHIATAL TOTAL ESOPHAGECTOMY   ([name]) PYLOROMYOTOMY JEJUNOSTOMY CERVICAL ESOPHAGOGASTROSTOMY  Total Length of Stay:  LOS: 0 days  BP 124/63 mmHg  Pulse 63  Temp(Src) 98.1 F (36.7 C) (Oral)  Resp 16  Ht 6\' 3"  (1.905 m)  Wt 242 lb (109.77 kg)  BMI 30.25 kg/m2  SpO2 96%  .Intake/Output      10/10 0701 - 10/11 0700   I.V. (mL/kg) 6134.8 (55.9)   Other 30   NG/GT 30   IV Piggyback 550   Total Intake(mL/kg) 6744.8 (61.4)   Urine (mL/kg/hr) 685 (0.5)   Emesis/NG output 0 (0)   Blood 850 (0.6)   Chest Tube 40 (0)   Total Output 1575   Net +5169.8         . dexmedetomidine 0.397 mcg/kg/hr (02/01/15 1810)  . dextrose 5 % and 0.45 % NaCl with KCl 20 mEq/L 125 mL/hr at 02/01/15 1704  . lactated ringers       Lab Results  Component Value Date   WBC 8.3 02/01/2015   HGB 12.2* 02/01/2015   HCT 36.0* 02/01/2015   PLT 124* 02/01/2015   GLUCOSE 139* 02/01/2015   ALT 29 01/28/2015   AST 31 01/28/2015   NA 138 02/01/2015   K 4.0 02/01/2015   CL 101 02/01/2015   CREATININE 0.88 02/01/2015   BUN 12 02/01/2015   CO2 25 02/01/2015   PSA 2.10 01/28/2015   INR 1.06 01/28/2015   Waking up, vs stable Weaning vent and extubate soon   Grace Isaac MD  Beeper [phone] Office [phone] 02/01/2015 7:12 PM

## 2015-02-01 NOTE — Plan of Care (Signed)
Problem: Phase I Progression Outcomes Goal: Tubes/drains patent Outcome: Progressing Lt   Penrose drain, NGT and J-tube patent and flushed.

## 2015-02-01 NOTE — Progress Notes (Signed)
NIF= > -40, VC= 1 L

## 2015-02-01 NOTE — Anesthesia Preprocedure Evaluation (Addendum)
Anesthesia Evaluation  Patient identified by MRN, date of birth, ID band Patient awake    Reviewed: Allergy & Precautions, NPO status , Patient's Chart, lab work & pertinent test results  History of Anesthesia Complications Negative for: history of anesthetic complications  Airway Mallampati: II  TM Distance: >3 FB Neck ROM: Full    Dental  (+) Teeth Intact, Dental Advisory Given   Pulmonary former smoker,    breath sounds clear to auscultation       Cardiovascular hypertension, Pt. on medications  Rhythm:Regular Rate:Normal     Neuro/Psych Anxiety    GI/Hepatic GERD  Medicated and Controlled,  Endo/Other    Renal/GU      Musculoskeletal  (+) Arthritis ,   Abdominal   Peds  Hematology   Anesthesia Other Findings   Reproductive/Obstetrics                         Anesthesia Physical Anesthesia Plan  ASA: III  Anesthesia Plan: General   Post-op Pain Management:    Induction: Intravenous and Rapid sequence  Airway Management Planned: Oral ETT and Video Laryngoscope Planned  Additional Equipment: CVP, Arterial line and Ultrasound Guidance Line Placement  Intra-op Plan:   Post-operative Plan: Post-operative intubation/ventilation  Informed Consent: I have reviewed the patients History and Physical, chart, labs and discussed the procedure including the risks, benefits and alternatives for the proposed anesthesia with the patient or authorized representative who has indicated his/her understanding and acceptance.   Dental advisory given  Plan Discussed with: CRNA, Anesthesiologist and Surgeon  Anesthesia Plan Comments:       Anesthesia Quick Evaluation

## 2015-02-01 NOTE — H&P (Signed)
[address]       [place], [address]             [phone]                    Konner A Holzmann [facility] Medical Record #[id number] Date of Birth: 1940/05/30  Referring: Dr Benay Spice Primary Care: Marton Redwood, MD  Chief Complaint:    No chief complaint on file.   History of Present Illness:    David [name] 74 y.o. male is seen  for follow up treatment for esophageal cancer. Patient noted  February 2016 some difficulty in swallowing and painful swallowing. This acutely became a problem with food obstruction requiring endoscopy. Endoscopy confirmed a distal esophageal mass at the GE junction. Subsequent biopsy confirmed adenocarcinoma. CT scan of the chest and abdomen , PET scan  and esophageal ultrasound  has been performed  Patient has been able to maintain his weight and nutrition. He started on Taxol/ carboplatinum completing his course of preoperative chemotherapy on 11/30/2014 he will complete radiation therapy on August 17.  He has tolerated the radiation chemotherapy very well with minimal side effects. Notes that he's been able to maintain his usual level of activity. His swallowing has improved.  Patient is seen Dr. Meda Coffee with cardiology for preoperative clearance. He's also seen by urology for  Prostate evaluation.  Current Activity/ Functional Status:  Patient is independent with mobility/ambulation, transfers, ADL's, IADL's.   Zubrod Score: At the time of surgery this patient's most appropriate activity status/level should be described as: [x]     0    Normal activity, no symptoms []     1    Restricted in physical strenuous activity but ambulatory, able to do out light work []     2    Ambulatory and capable of self care, unable to do work activities, up and about               >50 % of waking hours                              []     3    Only limited self care, in bed greater than 50% of waking hours []     4    Completely disabled, no self  care, confined to bed or chair []     5    Moribund   Past Medical History  Diagnosis Date  . Hypertension   . Anxiety   . Arthritis   . Basal cell carcinoma     nose, face, back  . Food impaction of esophagus 08/20/2014  . Esophageal cancer ([place])   . Enlarged prostate   . Allergy   . Radiation   . [name]'s esophagus   . Dysrhythmia   . Kidney stone   . Urinary frequency   . GERD (gastroesophageal reflux disease)   . Wears glasses     Past Surgical History  Procedure Laterality Date  . Total knee arthroplasty  2010    right  . Cervical laminectomy  1980  . Appendectomy  1961  . Colonoscopy      x2  . Esophagogastroduodenoscopy [name] 08/20/2014    Procedure: ESOPHAGOGASTRODUODENOSCOPY (EGD);  Surgeon: Gatha Mayer, MD;  Location: [facility] END[facility]COPY;  Service: Endoscopy;  Laterality: [name];  . Eus [name] 10/15/2014    Procedure: UPPER ENDOSCOPIC ULTRASOUND (EUS) LINEAR;  Surgeon: Melene Plan  Ardis Hughs, MD;  Location: [facility] ENDOSCOPY;  Service: Endoscopy;  Laterality: [name];  radial linear    Family History  Problem Relation Age of Onset  . Stomach cancer Neg Hx   . Colon cancer Neg Hx   . Lymphoma Mother   . Cancer Maternal Uncle   . Cancer Paternal Uncle   . Cancer Paternal Grandmother    Patient had one son who died at age 65 of adenocarcinoma the lung was a nonsmoker, presented with brain metastases and lived approximate 71 months post diagnosis. His father had bypass surgery developed a blood dyscrasia and died at age 40, his mother had lymphoma and died at age 26.   History   Social History  . Marital Status: Married    Spouse Name: [name]  . Number of Children: [name]  . Years of Education: [name]   Occupational History  .  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 Smoking status: Former Smoker    Quit date: 06/07/1961  . Smokeless tobacco: Never Used  . Alcohol Use: No  . Drug Use: No  . Sexual Activity: Not Currently            History   Smoking status  . Former Smoker  . Quit date: 06/07/1961  Smokeless tobacco  . Never Used    History  Alcohol Use No     Allergies  Allergen Reactions  . Nexium [Esomeprazole Magnesium] Other (See Comments)    Difficulty urinating and passing stool; dry mouth  . Other     Thinks it was oxycodone; made him hallucinate  . Statins Other (See Comments)    Leg cramping    Current Facility-Administered Medications  Medication Dose Route Frequency Provider Last Rate Last Dose  . 0.9 %  sodium chloride infusion   Intravenous Once Lorrene Reid, MD      . cefOXitin (MEFOXIN) 2 g in dextrose 5 % 50 mL IVPB  2 g Intravenous 4 times per day Grace Isaac, MD      . dextrose 5 % with cefUROXime (ZINACEF) ADS Med            Facility-Administered Medications Ordered in Other Encounters  Medication Dose Route Frequency Provider Last Rate Last Dose  . fentaNYL (SUBLIMAZE) injection    Anesthesia Intra-op Harden Mo, CRNA   50 mcg at 02/01/15 6644  . lactated ringers infusion    Continuous PRN Harden Mo, CRNA      . lactated ringers infusion    Continuous PRN Harden Mo, CRNA      . lactated ringers infusion    Continuous PRN Harden Mo, CRNA      . midazolam (VERSED) 5 MG/5ML injection    Anesthesia Intra-op Harden Mo, CRNA   1 mg at 02/01/15 0700      Review of Systems:     Cardiac Review of Systems: Y or N  Chest Pain [   n ]  Resting SOB [  n ] Exertional SOB  [n  ]  Orthopnea [ n ]   Pedal Edema [n   ]    Palpitations [ 25 years ago ] Syncope  [ n ]   Presyncope [n   ]  General Review of Systems: [Y] = yes [  ]=no Constitional: recent weight change [  ];  Wt loss over the last 3 months [  260 to 236 had been  trying to loose weight ] anorexia [  ]; fatigue [  ]; nausea [  ]; night sweats [  ]; fever [  ]; or chills [  ];          Dental: poor dentition[  ]; Last Dentist visit:   Eye : blurred vision [  ]; diplopia [   ]; vision changes [ y ];  Amaurosis fugax[   ]; Resp: cough [  ];  wheezing[  ];  hemoptysis[  ]; shortness of breath[  ]; paroxysmal nocturnal dyspnea[  ]; dyspnea on exertion[  ]; or orthopnea[  ];  GI:  gallstones[  ], vomiting[  ];  dysphagia[  ]; melena[  ];  hematochezia [  ]; heartburn[  ];   Hx of  Colonoscopy[y  ]; GU: kidney stones [ n ]; hematuria[ n ];   dysuria [  ];  nocturia[y  ];  history of     obstruction [  n]; urinary frequency Blue.Reese  ]             Skin: rash, swelling[  ];, hair loss[  ];  peripheral edema[  ];  or itching[  ]; Musculosketetal: myalgias[y  ];  joint swelling[  ];  joint erythema[  ];  joint pain[  ];  back pain[  ];  Heme/Lymph: bruising[  ];  bleeding[  ];  anemia[  ];  Neuro: TIA[n  ];  headaches[ n ];  stroke[n  ];  vertigo[  ];  seizures[n  ];   paresthesias[ n ];  difficulty walking[n  ];  Psych:depression[  ]; anxiety[  ];  Endocrine: diabetes[  ];  thyroid dysfunction[  ];  Immunizations: Flu up to date Florencio.Farrier  ]; Pneumococcal up to date [ y ];  Other:  Physical Exam: BP 152/73 mmHg  Pulse 79  Temp(Src) 97.5 F (36.4 C)  Resp 20  SpO2 96%  PHYSICAL EXAMINATION: General appearance: alert and cooperative Head: Normocephalic, without obvious abnormality, atraumatic Neck: no adenopathy, no carotid bruit, no JVD, supple, symmetrical, trachea midline and thyroid not enlarged, symmetric, no tenderness/mass/nodules Lymph nodes: Cervical, supraclavicular, and axillary nodes normal. Resp: clear to auscultation bilaterally Back: symmetric, no curvature. ROM normal. No CVA tenderness. Cardio: regular rate and rhythm, S1, S2 normal, no murmur, click, rub or gallop GI: soft, non-tender; bowel sounds normal; no masses,  no organomegaly Extremities: extremities normal, atraumatic, no cyanosis or edema and Homans sign is negative, no sign of DVT Neurologic: Grossly normal Patient has no carotid bruits, palpable DP and PT pulses bilaterally  Diagnostic Studies & Laboratory data:     Recent Radiology  Findings: Ct Chest Abdomen W Contrast  12/31/2014   CLINICAL DATA:  Carcinoma of the lower third of the esophagus. Preoperative assessment.  EXAM: CT CHEST AND ABDOMEN WITHOUT CONTRAST  TECHNIQUE: Multidetector CT imaging of the chest and abdomen was performed following the standard protocol without intravenous contrast.  COMPARISON:  10/23/2014  FINDINGS: CT CHEST FINDINGS  Mediastinum/Nodes: Left supraclavicular lymph node 7 mm in short axis on image 11 series 3, previously present on the PET-CT and not visibly hypermetabolic.  Aortic arch atherosclerosis.  Calcified mitral valve.  Distal esophageal wall thickening just above the hiatus. Adjacent lymph nodes include a 9 mm short axis node on image 53 series 3 and a 6 mm node on image 55 series 3, concerning due to their proximity to the mass. 5 mm short axis lymph node anterior to the aorta along image 49 series 3.  Lungs/Pleura: 5 mm in width nodule in the right lower lobe medially on image 57 of series 7. This is in a location rib pre but frequently corresponding to atelectasis, but is persistent from 10/06/2014.  Scarring or subsegmental atelectasis in the posterior basal segment right lower lobe. Pleural-based nodule on the right side measures 8 mm in thickness and has a faint surrounding halo of ground-glass opacity. This is similar to the prior exam and adjacent to some mild irregularity of the right posterior eighth and ninth ribs probably representing old rib fractures.  0.7 by 0.5 cm pulmonary nodule posterior to the right bronchus intermedius on image 34 series 7, stable but more sharply defined on today's exam due to slice selection.  Musculoskeletal: Lower cervical and thoracic spondylosis. Old right posterior rib deformities adjacent to the pleural thickening as noted above.  CT ABDOMEN FINDINGS  Hepatobiliary: Unremarkable  Pancreas: Unremarkable  Spleen: Unremarkable  Adrenals/Urinary Tract: Hypodense 8 mm medial right mid kidney lesion is  technically too small to characterize.  6 mm in long axis right UPJ stone with surrounding thickening and enhancement of the collecting system, and borderline hydronephrosis. 2 mm left kidney lower pole nonobstructive calculus. 6 mm hypodense lesion of the left kidney upper pole, likely a cyst but technically too small to characterize.  Stomach/Bowel: Unremarkable  Vascular/Lymphatic: No pathologic gastrohepatic ligament or porta hepatis adenopathy. Aortoiliac atherosclerotic vascular disease.  Other: No supplemental non-categorized findings.  Musculoskeletal: Lumbar spondylosis and degenerative disc disease. Large Schmorl's node along the inferior endplate of L4 with surrounding sclerosis likely from degenerative endplate findings. Similar Schmorl's node along the superior endplate of L5. Transitional S1 vertebra. Spondylosis and degenerative disc disease cause multilevel impingement in the lumbar spine.  IMPRESSION: 1. Abnormal distal esophageal wall thickening is present compatible with esophageal cancer. 2 adjacent lymph nodes are probably involved. 2. Several right lung nodules if in the 5-6 mm range merit observation. These were present but not hypermetabolic on prior PET-CT, but below sensitive PET-CT size thresholds. 3. Pleural-based nodularity measuring 8 mm in thickness along the right posterior hemithorax was not previously hypermetabolic, and is adjacent to some old rib fractures -this probably is a residua from the prior rib fractures but likewise merit some observation. 4. 6 mm in long axis right UPJ stone with surrounding mucosal thickening/enhancement due to irritation, and borderline right hydronephrosis. There is also a 2 mm left kidney lower pole nonobstructive calculus. 5. Other imaging findings of potential clinical significance: Atherosclerosis. Calcified mitral valve. Spondylosis and degenerative disc disease causing multilevel lumbar impingement.   Electronically Signed   By: Van Clines M.D.   On: 12/31/2014 14:31      Abdomen Pelvis W Contrast  10/06/2014   CLINICAL DATA:  74 year old male with difficulty swallowing for the past 2-3 months. Newly diagnosed adenocarcinoma of the esophagus. Evaluate for metastatic disease.  EXAM: CT CHEST AND ABDOMEN WITH CONTRAST  TECHNIQUE: Multidetector CT imaging of the chest and abdomen was performed following the standard protocol during bolus administration of intravenous contrast.  CONTRAST:  125mL OMNIPAQUE IOHEXOL 300 MG/ML  SOLN  COMPARISON:  No priors.  FINDINGS: CT CHEST FINDINGS  Mediastinum/Lymph Nodes: Heart size is normal. There is no significant pericardial fluid, thickening or pericardial calcification. There is atherosclerosis of the thoracic aorta, the great vessels of the mediastinum and the coronary arteries, including calcified atherosclerotic plaque in the right coronary artery. Calcifications of the mitral annulus and posterior leaflet of the mitral valve. Calcifications of the aortic valve.  Circumferential thickening of the distal esophagus immediately above the gastroesophageal junction. 9 mm paraesophageal lymph node in the lower mediastinum (image 48 of series 2). No other pathologically enlarged mediastinal or hilar lymph nodes are noted. No axillary lymphadenopathy.  Lungs/Pleura: Multiple small pulmonary nodules scattered throughout the lungs bilaterally, the largest of which is a pleural-based 15 x 7 mm nodule (image 32 of series 3), which is surrounded by a halo of ground-glass attenuation which in total measures 16 x 24 mm. Mild linear scarring in the left lower lobe. No acute consolidative airspace disease. No pleural effusions.  Musculoskeletal/Soft Tissues: There are no aggressive appearing lytic or blastic lesions noted in the visualized portions of the skeleton.  CT ABDOMEN AND PELVIS FINDINGS  Hepatobiliary: No cystic or solid hepatic lesions. No intra or extrahepatic biliary ductal dilatation. Gallbladder is  normal in appearance.  Pancreas: No pancreatic mass. No pancreatic ductal dilatation. No pancreatic or peripancreatic fluid or inflammatory changes.  Spleen: Unremarkable.  Adrenals/Urinary Tract: Bilateral adrenal glands are normal in appearance. 11 mm intermediate attenuation (34 HU) lesion in the medial aspect of the interpolar region of the right kidney is incompletely characterized, but favored to represent a tiny proteinaceous cyst. Sub cm low-attenuation lesion in the upper pole the left kidney is also too small to characterize, but favored to represent a cyst. 4 mm nonobstructive calculi are present within the lower pole collecting systems of the kidneys bilaterally. No ureteral stones or hydroureteronephrosis to suggest urinary tract obstruction at this time. Urinary bladder is unremarkable.  Stomach/Bowel: The thickening at the gastroesophageal junction does not appear to extend into the proximal stomach. Stomach is normal in appearance. No pathologic dilatation of small bowel or colon. Colonic diverticulosis, particularly in the sigmoid colon, without surrounding inflammatory changes to suggest an acute diverticulitis at this time.  Vascular/Lymphatic: Atherosclerosis throughout the abdominal and pelvic vasculature, without evidence of aneurysm or dissection. Multiple prominent but nonenlarged retroperitoneal lymph nodes are noted, conspicuous by their number rather than their size (nonspecific). No pathologically enlarged lymph nodes are noted in the abdomen or pelvis.  Reproductive: Prostate gland is enlarged, with severe median lobe hypertrophy. Prostate gland is heterogeneous in appearance with multifocal coarse calcifications (nonspecific). Seminal vesicles are unremarkable.  Other: No significant volume of ascites.  No pneumoperitoneum.  Musculoskeletal: There are no aggressive appearing lytic or blastic lesions noted in the visualized portions of the skeleton.  IMPRESSION: 1. Circumferential  thickening of the distal esophagus just before the gastroesophageal junction. A single borderline enlarged paraesophageal lymph node noted, which is nonspecific but concerning. In addition, there are several pulmonary nodules in the lungs bilaterally which are nonspecific. The largest of these is a mixed solid and sub solid lesion in the posterior aspect of the right upper lobe. While this may be infectious or inflammatory, the possibility of a metastatic lesion or lesions is not excluded, and close attention on followup studies is recommended. If the patient has symptoms of pulmonary infection, trial of antimicrobial therapy is recommended, followed by short-term repeat noncontrast chest CT in 2-3 weeks. If there are no such symptoms, further evaluation with PET-CT could be considered, and if this lesion in the right lower lobe is hypermetabolic, biopsy may be appropriate. 2. Tiny indeterminate renal lesions bilaterally, as above. Attention at time of follow-up imaging is recommended. 3. Atherosclerosis, including right coronary artery disease. Assessment for potential risk factor modification, dietary therapy or pharmacologic therapy may be warranted, if clinically indicated. 4. Colonic diverticulosis without findings to suggest  acute diverticulitis at this time. 5. Prostatomegaly with severe median lobe hypertrophy. 6. Additional incidental findings, as above.   Electronically Signed   By: Vinnie Langton M.D.   On: 10/06/2014 15:04   Nm Pet Image Initial (pi) Skull Base To Thigh  10/23/2014   CLINICAL DATA:  Initial treatment strategy for esophageal carcinoma.  EXAM: NUCLEAR MEDICINE PET SKULL BASE TO THIGH  TECHNIQUE: 12.5 mCi F-18 FDG was injected intravenously. Full-ring PET imaging was performed from the skull base to thigh after the radiotracer. CT data was obtained and used for attenuation correction and anatomic localization.  FASTING BLOOD GLUCOSE:  Value: 106 mg/dl  COMPARISON:  CT 10/06/2014   FINDINGS: NECK  No hypermetabolic lymph nodes in the neck.  CHEST  There is hypermetabolic thickening through the distal esophagus with SUV max equal 8.9. Small paraesophageal lymph nodes have faint nonspecific activity (8 mm on image 107, series and 7 mm on image 113 series 4).  There is a focus of peripheral consolidation in the posterior aspect of the right lower lobe along the plueral surface which measures 14 mm and is not changed in size from 15 mm on CT of 10/06/2014. This lesion has very low metabolic activity. There is no additional hypermetabolic pulmonary nodules.  ABDOMEN/PELVIS  No abnormal hypermetabolic activity within the liver, pancreas, adrenal glands, or spleen. No hypermetabolic lymph nodes in the abdomen or pelvis.  SKELETON  No focal hypermetabolic activity to suggest skeletal metastasis.  IMPRESSION: 1. Intense metabolic activity through the distal esophagus consistent with primary carcinoma. 2. Two small paraesophageal lymph nodes are suspicious for local nodal metastasis. These have low metabolic activity. 3. No evidence of liver metastasis. 4. Peripheral nodule within the right lower lobe is not changed in size. These lesions have very low nonspecific metabolic activity. Favor a focus infection, inflammation, or infarction.   Electronically Signed   By: Suzy Bouchard M.D.   On: 10/23/2014 14:55  ADDENDUM REPORT: 12/31/2014 14:46  ADDENDUM: Hypermetabolic focus in PROSTATE gland localizes to the right mid gland peripheral zone and measures approximately 1 cm (image 195 of fused data set). Recommend correlation with PSA and if elevated recommend urology consultation.   Electronically Signed  By: Suzy Bouchard M.D.  On: 12/31/2014 14:46   I have independently reviewed the above radiologic studies.  Recent Lab Findings: Lab Results  Component Value Date   WBC 4.7 01/28/2015   HGB 14.9 01/28/2015   HCT 43.3 01/28/2015   PLT 150 01/28/2015   GLUCOSE 108*  01/28/2015   ALT 29 01/28/2015   AST 31 01/28/2015   NA 141 01/28/2015   K 4.1 01/28/2015   CL 109 01/28/2015   CREATININE 0.79 01/28/2015   BUN 18 01/28/2015   CO2 24 01/28/2015   INR 1.06 01/28/2015   [facility] Patient: David [name], David [name] A Collected: 09/28/2014 Client: [facility] Accession: [id number] Diagnosis Surgical [P], distal esophagus mass - INVASIVE ADENOCARCINOMA IN A BACKGROUND OF [name]'S ESOPHAGUS.     Endoscopic findings: 1. Clearly malignant mass at the GE juntion. The mass appears non-circumferential by endocopy, occupying about 1/2-2/3 the circumference of the distal esophagus. This is creating an incomplete GE junction stricture, lumen through the stricture is 5-56mm and so I was unable to evaluate the distal aspect of the tumor. EUS findings: 1. The mass above was actually circumferntial by EUS, maximum thickness 1.4cm. The mass clear passes into and through the muscularis propria layer of the distal esophagus wall (uT3). 2. There were three suspicious (  round, well demarcated, hypoechoic) lymphnodes adjacent to the mass 9uN2). The largest lymphnode was 1cm across. ENDOSCOPIC IMPRESSION: PX1G6 GE junction adenocarcinoma (Stage IIIb). The mass is creating an incomplete stricture that was not able to be passed with the echoendoscope (lumen 5-51mm).   Assessment / Plan:    INVASIVE ADENOCARCINOMA OF THE DISTAL ESOPHAGUS IN A BACKGROUND OF [name]'S ESOPHAGUS,  clinical stage IIIB (uT3N2cM0)  [place] I,  primarily involving the distal esophagus and GE junction- I have discussed with the patient his wife and daughter the dx and indication for multimodality therapy including chem, radiation and surgical resection. I further discussed with the patient the surgical options and recommend that we proceed with resection oct 10   The patient has tolerated radiation and chemotherapy well, I recommended that we proceed with transhiatal total esophagectomy with  cervical esophagogastrostomy and placement of feeding jejunostomy tube.   I had a detailed discussion with Roswell Nickel regarding the magnitude of the surgical esophagectomy procedure as well as the risks, the expected benefits, and alternatives.  I quoted Roswell Nickel 5% perioperative mortality rate and a complication rate as high as 40%.  We specifically discussed complications, which include, but were not limited to: recurrent nerve injury with possible permanent hoarseness, anastomotic leak, airway and great vessel injury, conduit ischemia, thoracic duct leak, the inability to complete the operation via a transhiatal approach requiring a right thoracotomy,  bleeding, need for blood transfusion and the potential need for ventilator support.  Roswell Nickel has had questions answered is well informed and willing to proceed.       Grace Isaac MD      [address] [place] [address] Office [phone]   Beeper [phone]  02/01/2015 7:21 AM

## 2015-02-01 NOTE — Progress Notes (Signed)
Patient received into room 2S03 at 1600 being manually ventilated by CRNA. RT connected patient to the ventilator. RT will continue to monitor.

## 2015-02-01 NOTE — OR Nursing (Signed)
RN contacted 2S [facility]), confirming Room 3 postoperatively.

## 2015-02-01 NOTE — Transfer of Care (Signed)
Immediate Anesthesia Transfer of Care Note  Patient: David [name]  Procedure(s) Performed: Procedure(s): TRANSHIATAL TOTAL ESOPHAGECTOMY   ([name]) PYLOROMYOTOMY JEJUNOSTOMY CERVICAL ESOPHAGOGASTROSTOMY  Patient Location: ICU  Anesthesia Type:General  Level of Consciousness: sedated, unresponsive and Patient remains intubated per anesthesia plan  Airway & Oxygen Therapy: Patient remains intubated per anesthesia plan and Patient placed on Ventilator (see vital sign flow sheet for setting)  Post-op Assessment: Report given to RN and Post -op Vital signs reviewed and stable  Post vital signs: Reviewed and stable  Last Vitals:  Filed Vitals:   02/01/15 0627  BP:   Pulse:   Temp: 36.4 C  Resp:     Complications: No apparent anesthesia complications

## 2015-02-01 NOTE — Anesthesia Procedure Notes (Addendum)
Procedure Name: Intubation Date/Time: 02/01/2015 7:41 AM Performed by: Salli Quarry Autumn Gunn Pre-anesthesia Checklist: Patient identified, Emergency Drugs available, Suction available and Patient being monitored Patient Re-evaluated:Patient Re-evaluated prior to inductionOxygen Delivery Method: Circle system utilized Preoxygenation: Pre-oxygenation with 100% oxygen Intubation Type: IV induction Ventilation: Mask ventilation without difficulty Laryngoscope Size: Glidescope and 4 Grade View: Grade I Tube type: Oral (NIMS tube) Tube size: 8.0 mm Number of attempts: 1 Airway Equipment and Method: Video-laryngoscopy and Stylet Placement Confirmation: ETT inserted through vocal cords under direct vision,  positive ETCO2 and breath sounds checked- equal and bilateral Secured at: 26 (at teeth) cm Tube secured with: Tape Dental Injury: Teeth and Oropharynx as per pre-operative assessment       R IJV needle in place.

## 2015-02-02 ENCOUNTER — Inpatient Hospital Stay (HOSPITAL_COMMUNITY): Payer: Medicare Other

## 2015-02-02 ENCOUNTER — Encounter (HOSPITAL_COMMUNITY): Payer: Self-pay | Admitting: Cardiothoracic Surgery

## 2015-02-02 LAB — POCT I-STAT 3, ART BLOOD GAS (G3+)
Acid-base deficit: 2 mmol/L (ref 0.0–2.0)
Bicarbonate: 23.7 mEq/L (ref 20.0–24.0)
O2 Saturation: 94 %
Patient temperature: 98.1
TCO2: 25 mmol/L (ref 0–100)
pCO2 arterial: 42.4 mmHg (ref 35.0–45.0)
pH, Arterial: 7.354 (ref 7.350–7.450)
pO2, Arterial: 73 mmHg — ABNORMAL LOW (ref 80.0–100.0)

## 2015-02-02 LAB — CBC
HEMATOCRIT: 39.5 % (ref 39.0–52.0)
Hemoglobin: 13.5 g/dL (ref 13.0–17.0)
MCH: 33.6 pg (ref 26.0–34.0)
MCHC: 34.2 g/dL (ref 30.0–36.0)
MCV: 98.3 fL (ref 78.0–100.0)
Platelets: 127 10*3/uL — ABNORMAL LOW (ref 150–400)
RBC: 4.02 MIL/uL — AB (ref 4.22–5.81)
RDW: 14 % (ref 11.5–15.5)
WBC: 10.7 10*3/uL — AB (ref 4.0–10.5)

## 2015-02-02 LAB — BASIC METABOLIC PANEL
ANION GAP: 6 (ref 5–15)
BUN: 13 mg/dL (ref 6–20)
CO2: 26 mmol/L (ref 22–32)
Calcium: 8.2 mg/dL — ABNORMAL LOW (ref 8.9–10.3)
Chloride: 104 mmol/L (ref 101–111)
Creatinine, Ser: 0.76 mg/dL (ref 0.61–1.24)
GFR calc Af Amer: >60 mL/min (ref >60)
GFR calc non Af Amer: >60 mL/min (ref >60)
GLUCOSE: 183 mg/dL — AB (ref 65–99)
POTASSIUM: 3.9 mmol/L (ref 3.5–5.1)
Sodium: 136 mmol/L (ref 135–145)

## 2015-02-02 MED ORDER — METOPROLOL TARTRATE 1 MG/ML IV SOLN
2.5000 mg | INTRAVENOUS | Status: DC | PRN
  Administered 2015-02-02 – 2015-02-05 (×3): 2.5 mg via INTRAVENOUS
  Filled 2015-02-02 (×3): qty 5

## 2015-02-02 MED ORDER — KETOROLAC TROMETHAMINE 15 MG/ML IJ SOLN
INTRAMUSCULAR | Status: AC
  Filled 2015-02-02: qty 1

## 2015-02-02 MED ORDER — HYDRALAZINE HCL 20 MG/ML IJ SOLN
10.0000 mg | INTRAMUSCULAR | Status: AC | PRN
  Administered 2015-02-02 – 2015-02-06 (×8): 10 mg via INTRAVENOUS
  Filled 2015-02-02 (×8): qty 1

## 2015-02-02 MED ORDER — KETOROLAC TROMETHAMINE 15 MG/ML IJ SOLN
15.0000 mg | Freq: Once | INTRAMUSCULAR | Status: AC
  Administered 2015-02-02: 15 mg via INTRAVENOUS

## 2015-02-02 MED ORDER — ENOXAPARIN SODIUM 40 MG/0.4ML ~~LOC~~ SOLN
40.0000 mg | SUBCUTANEOUS | Status: DC
  Administered 2015-02-02 – 2015-02-12 (×11): 40 mg via SUBCUTANEOUS
  Filled 2015-02-02 (×11): qty 0.4

## 2015-02-02 MED ORDER — KETOROLAC TROMETHAMINE 15 MG/ML IJ SOLN
15.0000 mg | Freq: Once | INTRAMUSCULAR | Status: AC
  Administered 2015-02-02: 15 mg via INTRAVENOUS
  Filled 2015-02-02: qty 1

## 2015-02-02 NOTE — Progress Notes (Signed)
Patient ID: David [name], male   DOB: [birth year], 74 y.o.   MRN: [id number]  SICU Evening Rounds  Hypertensive, prn Lopressor ordered sats 96% Urine output ok Chest tube output low.

## 2015-02-02 NOTE — Progress Notes (Signed)
Utilization Review Completed.  

## 2015-02-02 NOTE — Progress Notes (Signed)
Patient ID: David [name], male   DOB: [birth year], 74 y.o.   MRN: [id number] [facility] DAILY ICU PROGRESS NOTE                   [address]            [place] [address]          [phone]   1 Day Post-Op Procedure(s) (LRB): TRANSHIATAL TOTAL ESOPHAGECTOMY   ([name]) PYLOROMYOTOMY JEJUNOSTOMY CERVICAL ESOPHAGOGASTROSTOMY  Total Length of Stay:  LOS: 1 day   Subjective: Awake and alert, can say "e" well  Objective: Vital signs in last 24 hours: Temp:  [97.4 F (36.3 C)-98.6 F (37 C)] 98.3 F (36.8 C) (10/11 0724) Pulse Rate:  [63-97] 71 (10/11 0700) Cardiac Rhythm:  [-] Normal sinus rhythm (10/10 2000) Resp:  [11-26] 18 (10/11 0746) BP: (115-179)/(59-93) 123/61 mmHg (10/11 0700) SpO2:  [94 %-98 %] 96 % (10/11 0746) Arterial Line BP: (114-190)/(49-82) 135/57 mmHg (10/11 0700) FiO2 (%):  [40 %-50 %] 40 % (10/10 2018) Weight:  [242 lb (109.77 kg)] 242 lb (109.77 kg) (10/10 1700)  Filed Weights   02/01/15 1700  Weight: 242 lb (109.77 kg)    Weight change:    Hemodynamic parameters for last 24 hours:    Intake/Output from previous day: 10/10 0701 - 10/11 0700 In: 8576.5 [I.V.:7716.5; NG/GT:120; IV Piggyback:650] Out: 2495 [Urine:1245; Emesis/NG output:100; Blood:850; Chest Tube:300]  Intake/Output this shift:    Current Meds: Scheduled Meds: . antiseptic oral rinse  7 mL Mouth Rinse QID  . cefOXitin  2 g Intravenous 4 times per day  . chlorhexidine  15 mL Mouth/Throat BID  . Chlorhexidine Gluconate Cloth  6 each Topical Q0600  . fentaNYL   Intravenous 6 times per day  . mupirocin ointment   Nasal BID  . pantoprazole (PROTONIX) IV  40 mg Intravenous Q12H  . sodium chloride  10 mL Intravenous Q12H  . sodium chloride  3 mL Intravenous Q12H   Continuous Infusions: . dexmedetomidine Stopped (02/02/15 0600)  . dextrose 5 % and 0.45 % NaCl with KCl 20 mEq/L 125 mL/hr at 02/02/15 0700  . lactated ringers     PRN Meds:.albuterol, diphenhydrAMINE  **OR** diphenhydrAMINE, Influenza vac split quadrivalent PF, naloxone **AND** sodium chloride, ondansetron (ZOFRAN) IV, pneumococcal 23 valent vaccine, potassium chloride, sodium chloride  General appearance: alert and cooperative Neurologic: intact Heart: regular rate and rhythm, S1, S2 normal, no murmur, click, rub or gallop Lungs: diminished breath sounds bibasilar Abdomen: soft, non-tender; bowel sounds normal; no masses,  no organomegaly Extremities: extremities normal, atraumatic, no cyanosis or edema and Homans sign is negative, no sign of DVT Wound: dressing intact minimial from chest tube  Lab Results: CBC: Recent Labs  02/01/15 1610 02/01/15 1619 02/02/15 0359  WBC 8.3  --  10.7*  HGB 12.9* 12.2* 13.5  HCT 36.9* 36.0* 39.5  PLT 124*  --  127*   BMET:  Recent Labs  02/01/15 1610 02/01/15 1619 02/02/15 0359  NA 134* 138 136  K 3.9 4.0 3.9  CL 101  --  104  CO2 25  --  26  GLUCOSE 140* 139* 183*  BUN 12  --  13  CREATININE 0.88  --  0.76  CALCIUM 7.9*  --  8.2*    PT/INR: No results for input(s): LABPROT, INR in the last 72 hours. Radiology: Dg Chest Port 1 View  02/01/2015   CLINICAL DATA:  Status post esophagectomy  EXAM: PORTABLE CHEST -  1 VIEW  COMPARISON:  02/01/2015  FINDINGS: Postsurgical changes are now seen. A right jugular central line, endotracheal tube, nasogastric catheter and left thoracostomy catheter are seen. No pneumothorax is noted. No focal infiltrate or sizable effusion is seen.  IMPRESSION: Postsurgical changes with tubes and lines as described.   Electronically Signed   By: Inez Catalina M.D.   On: 02/01/2015 16:33     Assessment/Plan: S/P Procedure(s) (LRB): TRANSHIATAL TOTAL ESOPHAGECTOMY   ([name]) PYLOROMYOTOMY JEJUNOSTOMY CERVICAL ESOPHAGOGASTROSTOMY Mobilize Diuresis Continue foley due to strict I&O     Grace Isaac 02/02/2015 8:11 AM

## 2015-02-02 NOTE — Anesthesia Postprocedure Evaluation (Signed)
  Anesthesia Post-op Note  Patient: David [name]  Procedure(s) Performed: Procedure(s): TRANSHIATAL TOTAL ESOPHAGECTOMY   ([name]) PYLOROMYOTOMY JEJUNOSTOMY CERVICAL ESOPHAGOGASTROSTOMY  Patient Location: ICU  Anesthesia Type:General  Level of Consciousness: sedated  Airway and Oxygen Therapy: Patient remains intubated per anesthesia plan  Post-op Pain: none  Post-op Assessment: Post-op Vital signs reviewed, Patient's Cardiovascular Status Stable and Respiratory Function Stable              Post-op Vital Signs: Reviewed and stable  Last Vitals:  Filed Vitals:   02/02/15 0900  BP: 152/74  Pulse: 73  Temp:   Resp: 17    Complications: No apparent anesthesia complications

## 2015-02-03 ENCOUNTER — Inpatient Hospital Stay (HOSPITAL_COMMUNITY): Payer: Medicare Other

## 2015-02-03 LAB — POCT I-STAT 7, (LYTES, BLD GAS, ICA,H+H)
Acid-Base Excess: 1 mmol/L (ref 0.0–2.0)
Bicarbonate: 27.4 mEq/L — ABNORMAL HIGH (ref 20.0–24.0)
Bicarbonate: 24.3 mEq/L — ABNORMAL HIGH (ref 20.0–24.0)
Calcium, Ion: 1.18 mmol/L (ref 1.13–1.30)
Calcium, Ion: 1.13 mmol/L (ref 1.13–1.30)
HCT: 37 % — ABNORMAL LOW (ref 39.0–52.0)
HCT: 35 % — ABNORMAL LOW (ref 39.0–52.0)
Hemoglobin: 12.6 g/dL — ABNORMAL LOW (ref 13.0–17.0)
Hemoglobin: 11.9 g/dL — ABNORMAL LOW (ref 13.0–17.0)
O2 Saturation: 98 %
O2 Saturation: 99 %
Patient temperature: 35.7
Patient temperature: 36.9
Potassium: 3.7 mmol/L (ref 3.5–5.1)
Potassium: 3.8 mmol/L (ref 3.5–5.1)
Sodium: 139 mmol/L (ref 135–145)
Sodium: 137 mmol/L (ref 135–145)
TCO2: 29 mmol/L (ref 0–100)
TCO2: 25 mmol/L (ref 0–100)
pCO2 arterial: 45 mmHg (ref 35.0–45.0)
pCO2 arterial: 37.6 mmHg (ref 35.0–45.0)
pH, Arterial: 7.387 (ref 7.350–7.450)
pH, Arterial: 7.418 (ref 7.350–7.450)
pO2, Arterial: 98 mmHg (ref 80.0–100.0)
pO2, Arterial: 117 mmHg — ABNORMAL HIGH (ref 80.0–100.0)

## 2015-02-03 LAB — BASIC METABOLIC PANEL
Anion gap: 9 (ref 5–15)
BUN: 13 mg/dL (ref 6–20)
CO2: 25 mmol/L (ref 22–32)
Calcium: 8.3 mg/dL — ABNORMAL LOW (ref 8.9–10.3)
Chloride: 102 mmol/L (ref 101–111)
Creatinine, Ser: 0.76 mg/dL (ref 0.61–1.24)
GFR calc Af Amer: >60 mL/min (ref >60)
GFR calc non Af Amer: >60 mL/min (ref >60)
Glucose, Bld: 106 mg/dL — ABNORMAL HIGH (ref 65–99)
Potassium: 3.9 mmol/L (ref 3.5–5.1)
Sodium: 136 mmol/L (ref 135–145)

## 2015-02-03 LAB — CBC
HCT: 40.5 % (ref 39.0–52.0)
Hemoglobin: 13.9 g/dL (ref 13.0–17.0)
MCH: 34.2 pg — ABNORMAL HIGH (ref 26.0–34.0)
MCHC: 34.3 g/dL (ref 30.0–36.0)
MCV: 99.5 fL (ref 78.0–100.0)
Platelets: 124 10*3/uL — ABNORMAL LOW (ref 150–400)
RBC: 4.07 MIL/uL — ABNORMAL LOW (ref 4.22–5.81)
RDW: 14.4 % (ref 11.5–15.5)
WBC: 13.4 10*3/uL — ABNORMAL HIGH (ref 4.0–10.5)

## 2015-02-03 LAB — TYPE AND SCREEN
ABO/RH(D): O POS
Antibody Screen: NEGATIVE
Unit division: 0
Unit division: 0
Unit division: 0
Unit division: 0

## 2015-02-03 LAB — GLUCOSE, CAPILLARY
Glucose-Capillary: 95 mg/dL (ref 65–99)
Glucose-Capillary: 90 mg/dL (ref 65–99)
Glucose-Capillary: 97 mg/dL (ref 65–99)
Glucose-Capillary: 111 mg/dL — ABNORMAL HIGH (ref 65–99)

## 2015-02-03 MED ORDER — ACETAMINOPHEN 10 MG/ML IV SOLN
1000.0000 mg | Freq: Four times a day (QID) | INTRAVENOUS | Status: AC
  Administered 2015-02-03 – 2015-02-04 (×3): 1000 mg via INTRAVENOUS
  Filled 2015-02-03 (×4): qty 100

## 2015-02-03 MED ORDER — KETOROLAC TROMETHAMINE 15 MG/ML IJ SOLN
15.0000 mg | Freq: Three times a day (TID) | INTRAMUSCULAR | Status: DC
  Administered 2015-02-03: 15 mg via INTRAVENOUS
  Filled 2015-02-03: qty 1

## 2015-02-03 MED ORDER — VITAL AF 1.2 CAL PO LIQD
1000.0000 mL | ORAL | Status: DC
  Administered 2015-02-03: 1000 mL
  Filled 2015-02-03: qty 1000

## 2015-02-03 MED ORDER — VITAL HIGH PROTEIN PO LIQD
1000.0000 mL | ORAL | Status: DC
Start: 2015-02-03 — End: 2015-02-03

## 2015-02-03 MED ORDER — VITAL AF 1.2 CAL PO LIQD: 1000.0000 mL | ORAL | Status: DC

## 2015-02-03 NOTE — Op Note (Signed)
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  SURGICAL PROCEDURE:  Transhiatal total esophagectomy with cervical esophagogastrostomy, pyloromyotomy and feeding jejunostomy tube.  SURGEON:  Lanelle Bal, MD  FIRST ASSISTANT:  Suzzanne Cloud, P.A.  BRIEF HISTORY:  The patient is a 74 year old male who began having swallowing difficulties in early 2016.  Further evaluation confirmed stage III esophageal adenocarcinoma extending at least 4 cm of the distal esophagus to the GE junction with CT scan, PET scan and transesophageal and EUS suggested stage III esophageal cancer.  The patient underwent preoperative radiation therapy and chemotherapy with good clinical response.  Followup CT scan showed no evidence of distant metastasis and it was recommend to the patient to proceed with esophagectomy.  Risks and options were discussed in detail and the patient was agreeable.  DESCRIPTION OF PROCEDURE:  The patient underwent general endotracheal anesthesia.  A NIMS endotracheal tube was placed without difficulty. The left neck, chest and abdomen were prepped with Betadine and draped in usual sterile manner.  Appropriate time-out was performed.  We then proceeded with upper midline abdominal incision.  Through this, we explored the abdomen, there were changes of the left lobe of the liver associated with radiation.  There were no peritoneal metastasis appreciated or  liver metastasis appreciated.  We then proceeded with resection first taking down the short gastrics with the LEGAX device carefully to divide away from the stomach wall with the fundus freed up. The GE junction was dissected free and opened, a Penrose drain was placed around the esophagus.  We began the transhiatal dissection.  At this area, there was no evidence of direct extension into surrounding tissues of tumor.  The right gastroepiploic vessel had an easily palpable pulse.  We then entered the lesser sac of the stomach freeing along the greater curve preserving the right gastroepiploic vessel.  The left cardinal vein and left gastric artery were identified and encircled with loops.  A bulldog was placed on the left gastric artery, did not interfere with pulse in the right gastroepiploic vessel.  Kocher maneuver was performed to free the GE junction to some degree.  We then proceeded with transhiatal esophagectomy freeing the esophagus.  As the dissection went, the Harmonic scalpel was used to divide tissue.  A separate periesophageal segment of lymph nodes was removed and submitted to Pathology.  After reaching as far as possible through the abdominal incision, we then moved to the left neck.  An incision was made along the sternocleidomastoid muscle.  Dissection carried down through the platysma, strap muscles were divided.  The carotid and jugular vein were retracted laterally and the dissection was carried down to the prevertebral fascia.  No metal  retractors were used deep in the neck. We rotated the trachea and esophagus slightly to the left.  The NIMS stimulator was used to confirm the location of the recurrent nerve, which was done with the stimulator in a repeated manner confirming its location.  A Penrose drain was placed around the cervical esophagus. Working from the cervical incision, an abdominal incision was then completed freeing the esophagus.  The esophagus was then  divided at the cervical level with GIA stapler and the specimen delivered to the abdomen.  We then, with vascular stapler, divided the cardinal vein and left gastric artery freeing the long lesser curve of the stomach.  A gastric tube was then created with serial firings of GIA stapler along the lesser curve of the stomach, taken sufficient margin from the GE junction.  This left a gastric tube of sufficient length and of good quality with intact blood supply.  A pyloromyotomy was then performed dividing the pyloric muscle without dividing the mucosa.  The 4-0 silk sutures were placed horizontally across the myotomy for reinforcement of the serosa.  The GI gastric tube was marked and carefully positioned and placed in a telescope bags and using a Foley catheter, the specimen was delivered to the cervical incision and reached without compromise.  The cervical esophagus was then tacked to the anterior wall of the esophagus.  The cervical esophagus was tacked to the anterior wall of the stomach.  The stapled end of the esophagus was divided and a small opening was made in the stomach.  The mucosa appeared viable and pink bleeding at this level.  A 30-cm GIA Endo stapler was then selected. The mucosal edges of the esophagus and stomach were approximated with Vicryl sutures.  The stapler was then placed one in the esophagus and one in the stomach and the back wall of the anastomosis was completed. The NG tube was passed back now across the anastomosis and positioned in the upper abdomen.  The anterior wall of anastomosis was then completed with interrupted Vicryl sutures and 3-0 silk sutures.  A Penrose drain was left in the area of the anastomosis and brought out through a separate site of the neck.  We then tacked the stomach to the hiatus to prevent herniation into the chest.  A left chest tube was placed.  An 57- French red rubber catheter was then windshield into the proximal jejunum and  brought out through the left abdominal cavity.  Sponge and needle counts were reported as correct.  We then closed the neck incision with interrupted 3-0 Vicryls and a 4-0 subcuticular stitch.  The abdominal incision was closed with #1 Prolene suture.  The subcutaneous tissue closed with a running 2-0 Vicryl and a 3-0 subcuticular stitch and skin edges.  Dry dressings were applied.  Sponge and needle counts were reported as correct at completion of procedure.  The patient tolerated the procedure without obvious complication.  Estimated blood loss approximately 400 mL.  Prior to closure of the neck incision and after all manipulation had been done, the NIMS stimulator was again used to confirm the recurrent nerve was intact.  The patient was left intubated and transferred to the Surgical Intensive care Unit for postoperative wean.     Lanelle Bal, MD     EG/MEDQ  D:  02/02/2015  T:  02/03/2015  Job:  [id number]

## 2015-02-03 NOTE — Progress Notes (Signed)
[facility] BRIEF SICU PROGRESS NOTE  2 Days Post-Op  S/P Procedure(s) (LRB): TRANSHIATAL TOTAL ESOPHAGECTOMY   ([name]) PYLOROMYOTOMY JEJUNOSTOMY CERVICAL ESOPHAGOGASTROSTOMY   Stable day NSR w/ stable BP O2 sats 95%  Plan: Continue current plan  Rexene Alberts, MD 02/03/2015 9:51 PM

## 2015-02-03 NOTE — Progress Notes (Addendum)
Initial Nutrition Assessment  DOCUMENTATION CODES:   Obesity unspecified  INTERVENTION:    Initiate Vital AF 1.2 formula at 10 ml/hr.  Advancement per MD discretion.  **Recommended goal rate is 80 ml/hr which provides 2304 kcals, 144 gm protein, 1557 ml of free water.  NUTRITION DIAGNOSIS:   Inadequate oral intake related to inability to eat (s/p esophagectomy) as evidenced by NPO status  GOAL:   Patient will meet greater than or equal to 90% of their needs  MONITOR:   TF tolerance, Diet advancement, PO intake, Labs, Weight trends, I & O's  REASON FOR ASSESSMENT:   Consult Enteral/tube feeding initiation and management  ASSESSMENT:   74 y.o. Male is seenfor follow up treatment for esophageal cancer. Patient notedFebruary 2016 some difficulty in swallowing and painful swallowing. This acutely became a problem with food obstruction requiring endoscopy. Endoscopy confirmed a distal esophageal mass at the GE junction. Subsequent biopsy confirmed adenocarcinoma. CT scan of the chest and abdomen, PET scan and esophageal ultrasound has been performed.  Patient s/p procedures TRANSHIATAL TOTAL ESOPHAGECTOMY PYLOROMYOTOMY JEJUNOSTOMY CERVICAL ESOPHAGOGASTROSTOMY  RD spoke with patient and patient's daughters at bedside.  Reports patient's appetite was good PTA.  Was eating solid foods PTA.  Pt was also drinking smoothies from [employer].  No reported weight loss.  RD explained role in nutrition support via J-tube during hospitalization.  RN notified of nutrition care plan.  Nutrition focused physical exam completed.  No muscle or subcutaneous fat depletion noticed.  Diet Order:  Diet NPO time specified  Skin:  Reviewed, no issues  Last BM:  [name]  Height:   Ht Readings from Last 1 Encounters:  02/01/15 6\' 3"  (1.905 m)    Weight:   Wt Readings from Last 1 Encounters:  02/03/15 244 lb 7.8 oz (110.9 kg)    Ideal Body Weight:  89 kg  BMI:  Body mass index is  30.56 kg/(m^2).  Estimated Nutritional Needs:   Kcal:  2458-0998  Protein:  135-145 gm  Fluid:  2.3-2.5 L  EDUCATION NEEDS:   No education needs identified at this time  Arthur Holms, RD, LDN Pager #: [phone] After-Hours Pager #: [phone]

## 2015-02-03 NOTE — Progress Notes (Addendum)
[facility] DAILY ICU PROGRESS NOTE                   [address]            [place] [address]          [phone]   2 Days Post-Op Procedure(s) (LRB): TRANSHIATAL TOTAL ESOPHAGECTOMY   ([name]) PYLOROMYOTOMY JEJUNOSTOMY CERVICAL ESOPHAGOGASTROSTOMY  Total Length of Stay:  LOS: 2 days   Subjective:  David [name] states he had a rough night.  He was not able to sleep.  He is also having issues with pain control.  He is maxing out his PCA and still not achieving adequate relief.  Objective: Vital signs in last 24 hours: Temp:  [97.9 F (36.6 C)-98.8 F (37.1 C)] 98.2 F (36.8 C) (10/12 0403) Pulse Rate:  [73-110] 105 (10/12 0700) Cardiac Rhythm:  [-] Normal sinus rhythm (10/12 0400) Resp:  [9-26] 25 (10/12 0700) BP: (130-187)/(59-86) 158/72 mmHg (10/12 0700) SpO2:  [94 %-100 %] 96 % (10/12 0700) Arterial Line BP: (173)/(68) 173/68 mmHg (10/11 0900) Weight:  [244 lb 7.8 oz (110.9 kg)] 244 lb 7.8 oz (110.9 kg) (10/12 0634)  Filed Weights   02/01/15 1700 02/03/15 0634  Weight: 242 lb (109.77 kg) 244 lb 7.8 oz (110.9 kg)    Weight change: 2 lb 7.9 oz (1.13 kg)   Intake/Output from previous day: 10/11 0701 - 10/12 0700 In: 3161.5 [I.V.:2981.5; NG/GT:90] Out: 1950 [Urine:1300; Emesis/NG output:300; Chest Tube:350]  Current Meds: Scheduled Meds: . antiseptic oral rinse  7 mL Mouth Rinse QID  . chlorhexidine  15 mL Mouth/Throat BID  . Chlorhexidine Gluconate Cloth  6 each Topical Q0600  . enoxaparin (LOVENOX) injection  40 mg Subcutaneous Q24H  . fentaNYL   Intravenous 6 times per day  . pantoprazole (PROTONIX) IV  40 mg Intravenous Q12H  . sodium chloride  10 mL Intravenous Q12H  . sodium chloride  3 mL Intravenous Q12H   Continuous Infusions: . dexmedetomidine Stopped (02/02/15 0600)  . dextrose 5 % and 0.45 % NaCl with KCl 20 mEq/L 125 mL/hr at 02/03/15 0437  . lactated ringers     PRN Meds:.albuterol, diphenhydrAMINE **OR** diphenhydrAMINE, hydrALAZINE,  Influenza vac split quadrivalent PF, metoprolol, naloxone **AND** sodium chloride, ondansetron (ZOFRAN) IV, pneumococcal 23 valent vaccine, potassium chloride, sodium chloride  General appearance: alert, cooperative and no distress Heart: regular rate and rhythm Lungs: clear to auscultation bilaterally Abdomen: soft, mild distention, no bowel sounds Wound: Penrose drain with minimal drainage  Lab Results: CBC: Recent Labs  02/02/15 0359 02/03/15 0447  WBC 10.7* 13.4*  HGB 13.5 13.9  HCT 39.5 40.5  PLT 127* 124*   BMET:  Recent Labs  02/02/15 0359 02/03/15 0447  NA 136 136  K 3.9 3.9  CL 104 102  CO2 26 25  GLUCOSE 183* 106*  BUN 13 13  CREATININE 0.76 0.76  CALCIUM 8.2* 8.3*    PT/INR: No results for input(s): LABPROT, INR in the last 72 hours. Radiology: Dg Chest Port 1 View  02/03/2015  CLINICAL DATA:  Status post hernia repair. EXAM: PORTABLE CHEST 1 VIEW COMPARISON:  February 02, 2015. FINDINGS: Stable cardiomegaly. Left-sided chest tube is unchanged in position with no evidence of pneumothorax. Stable support apparatus. Stable mild bibasilar opacities are noted concerning for subsegmental atelectasis. Bony thorax is unremarkable. IMPRESSION: Stable position of left-sided chest tube without pneumothorax. Stable bibasilar subsegmental atelectasis. Electronically Signed   By: Marijo Conception, M.D.   On:  02/03/2015 07:42   Dg Chest Port 1 View  02/02/2015  CLINICAL DATA:  Postoperative esophagectomy for distal esophageal carcinoma EXAM: PORTABLE CHEST 1 VIEW COMPARISON:  February 01, 2015 FINDINGS: Endotracheal tube has been removed. Left chest tube remains in position. Central catheter tip is in the superior vena cava. Nasogastric tube tip and side port are below the diaphragm. No pneumothorax. There is consolidation in the medial left base, stable. There is mild right base atelectasis. No new opacity. Heart is upper normal in size with pulmonary vascularity within normal  limits, stable. No adenopathy. There is degenerative change in the thoracic spine. IMPRESSION: Persistent left base consolidation. Stable right base atelectasis. No new opacity. No change in cardiac silhouette. No pneumothorax. Electronically Signed   By: Lowella Grip III M.D.   On: 02/02/2015 08:44     Assessment/Plan: S/P Procedure(s) (LRB): TRANSHIATAL TOTAL ESOPHAGECTOMY   ([name]) PYLOROMYOTOMY JEJUNOSTOMY CERVICAL ESOPHAGOGASTROSTOMY  1. CV- Hypertensive- on Hydralazine, Lopressor prn, will monitor may need to increase dosage 2. Pulm- no acute issues, bilateral atelectasis, chest tube output remains low 3. GI- continue NG tube, J tube care, remain NPO 4. Pain control- on Fentanyl PCA with some relief, will discuss pain management with Dr. Servando [name] 5. Dispo- will discuss pain control with Dr. Servando [name], continue current care,esophagram early next week     [name], David 02/03/2015 8:00 AM  ]patient ambulating in unit this am, has had prostate issues in the past and on flomax but unable to give yet will leave foley in today Start low rate j tube feeding Will try low dose toradol today for pain I have seen and examined David [name] and agree with the above assessment  and plan.  David Isaac MD Beeper [phone] Office [phone] 02/03/2015 8:57 AM

## 2015-02-04 ENCOUNTER — Inpatient Hospital Stay (HOSPITAL_COMMUNITY): Payer: Medicare Other

## 2015-02-04 LAB — BASIC METABOLIC PANEL
Anion gap: 4 — ABNORMAL LOW (ref 5–15)
BUN: 11 mg/dL (ref 6–20)
CO2: 27 mmol/L (ref 22–32)
Calcium: 8.1 mg/dL — ABNORMAL LOW (ref 8.9–10.3)
Chloride: 104 mmol/L (ref 101–111)
Creatinine, Ser: 0.65 mg/dL (ref 0.61–1.24)
GFR calc Af Amer: >60 mL/min (ref >60)
GFR calc non Af Amer: >60 mL/min (ref >60)
GLUCOSE: 117 mg/dL — AB (ref 65–99)
POTASSIUM: 4 mmol/L (ref 3.5–5.1)
Sodium: 135 mmol/L (ref 135–145)

## 2015-02-04 LAB — CBC
HCT: 39.1 % (ref 39.0–52.0)
Hemoglobin: 13 g/dL (ref 13.0–17.0)
MCH: 33.3 pg (ref 26.0–34.0)
MCHC: 33.2 g/dL (ref 30.0–36.0)
MCV: 100.3 fL — ABNORMAL HIGH (ref 78.0–100.0)
Platelets: 123 10*3/uL — ABNORMAL LOW (ref 150–400)
RBC: 3.9 MIL/uL — ABNORMAL LOW (ref 4.22–5.81)
RDW: 14.7 % (ref 11.5–15.5)
WBC: 9.6 10*3/uL (ref 4.0–10.5)

## 2015-02-04 LAB — GLUCOSE, CAPILLARY
Glucose-Capillary: 100 mg/dL — ABNORMAL HIGH (ref 65–99)
Glucose-Capillary: 104 mg/dL — ABNORMAL HIGH (ref 65–99)
Glucose-Capillary: 114 mg/dL — ABNORMAL HIGH (ref 65–99)
Glucose-Capillary: 118 mg/dL — ABNORMAL HIGH (ref 65–99)
Glucose-Capillary: 117 mg/dL — ABNORMAL HIGH (ref 65–99)

## 2015-02-04 MED ORDER — ACETAMINOPHEN 160 MG/5ML PO SOLN: 325.0000 mg | ORAL | Status: DC | PRN

## 2015-02-04 MED ORDER — ONDANSETRON HCL 4 MG/2ML IJ SOLN: 4.0000 mg | Freq: Four times a day (QID) | INTRAMUSCULAR | Status: DC | PRN

## 2015-02-04 MED ORDER — DIPHENHYDRAMINE HCL 50 MG/ML IJ SOLN
25.0000 mg | Freq: Once | INTRAMUSCULAR | Status: AC
  Administered 2015-02-04: 25 mg via INTRAVENOUS
  Filled 2015-02-04: qty 1

## 2015-02-04 MED ORDER — METHYLPREDNISOLONE SODIUM SUCC 125 MG IJ SOLR
80.0000 mg | Freq: Once | INTRAMUSCULAR | Status: AC
  Administered 2015-02-04: 80 mg via INTRAVENOUS
  Filled 2015-02-04: qty 2

## 2015-02-04 MED ORDER — NALOXONE HCL 0.4 MG/ML IJ SOLN: 0.4000 mg | INTRAMUSCULAR | Status: DC | PRN

## 2015-02-04 MED ORDER — OXYCODONE HCL 5 MG/5ML PO SOLN: 5.0000 mg | ORAL | Status: DC | PRN

## 2015-02-04 MED ORDER — HYDROMORPHONE 0.3 MG/ML IV SOLN
INTRAVENOUS | Status: DC
  Administered 2015-02-04: 0.9 mg via INTRAVENOUS
  Administered 2015-02-04: 1.5 mg via INTRAVENOUS

## 2015-02-04 MED ORDER — SODIUM CHLORIDE 0.9 % IJ SOLN: 9.0000 mL | INTRAMUSCULAR | Status: DC | PRN

## 2015-02-04 MED ORDER — DIPHENHYDRAMINE HCL 50 MG/ML IJ SOLN
12.5000 mg | Freq: Every evening | INTRAMUSCULAR | Status: DC | PRN
  Administered 2015-02-05 – 2015-02-10 (×6): 12.5 mg via INTRAVENOUS
  Filled 2015-02-04 (×6): qty 1

## 2015-02-04 MED ORDER — VITAL AF 1.2 CAL PO LIQD
1000.0000 mL | ORAL | Status: DC
  Filled 2015-02-04 (×2): qty 1000

## 2015-02-04 MED ORDER — VITAL AF 1.2 CAL PO LIQD
1000.0000 mL | ORAL | Status: DC
  Administered 2015-02-04: 1000 mL
  Filled 2015-02-04 (×2): qty 1000

## 2015-02-04 MED ORDER — DIPHENHYDRAMINE HCL 50 MG/ML IJ SOLN: 12.5000 mg | Freq: Four times a day (QID) | INTRAMUSCULAR | Status: DC | PRN

## 2015-02-04 MED ORDER — DIPHENHYDRAMINE HCL 12.5 MG/5ML PO ELIX
12.5000 mg | ORAL_SOLUTION | Freq: Four times a day (QID) | ORAL | Status: DC | PRN
  Filled 2015-02-04: qty 5

## 2015-02-04 MED ORDER — HYDROMORPHONE 0.3 MG/ML IV SOLN
INTRAVENOUS | Status: DC
  Administered 2015-02-04: 09:00:00 via INTRAVENOUS
  Filled 2015-02-04: qty 25

## 2015-02-04 MED ORDER — HYDROCODONE-ACETAMINOPHEN 7.5-325 MG/15ML PO SOLN
10.0000 mL | ORAL | Status: DC | PRN
  Administered 2015-02-04 – 2015-02-12 (×17): 10 mL via ORAL
  Filled 2015-02-04 (×18): qty 15

## 2015-02-04 NOTE — Progress Notes (Signed)
Dilaudid PCA stopped due to patient reports increased itching with some throat soreness and verbal orders from Dr. Prescott Gum. Wasted 10 ml of Dilaudid PCA down sink witnessed by [employer]. Educated the pt that for pain control he will have to ask for pain medication. Patient verbalized understanding.

## 2015-02-04 NOTE — Care Management Important Message (Signed)
Important Message  Patient Details  Name: David [name] MRN: [id number] Date of Birth: Jun 18, 1940   Medicare Important Message Given:  Yes-second notification given    Nathen May 02/04/2015, 10:36 AM

## 2015-02-04 NOTE — Progress Notes (Addendum)
TCTS DAILY ICU PROGRESS NOTE                   [address]            [place] [address]          [phone]   3 Days Post-Op Procedure(s) (LRB): TRANSHIATAL TOTAL ESOPHAGECTOMY   ([name]) PYLOROMYOTOMY JEJUNOSTOMY CERVICAL ESOPHAGOGASTROSTOMY  Total Length of Stay:  LOS: 3 days   Subjective:  David [name] complains of itching all over.  He states it has been ever since surgery.  He wonders if its related to Fentanyl.  He also continues to complain of not sleeping at night.  Objective: Vital signs in last 24 hours: Temp:  [97 F (36.1 C)-98.4 F (36.9 C)] 98.4 F (36.9 C) (10/13 0354) Pulse Rate:  [78-110] 104 (10/13 0700) Cardiac Rhythm:  [-] Normal sinus rhythm (10/13 0400) Resp:  [10-24] 18 (10/13 0700) BP: (120-177)/(57-75) 154/73 mmHg (10/13 0700) SpO2:  [93 %-98 %] 96 % (10/13 0700) FiO2 (%):  [40 %] 40 % (10/12 1600) Weight:  [245 lb 9.5 oz (111.4 kg)] 245 lb 9.5 oz (111.4 kg) (10/13 0700)  Filed Weights   02/01/15 1700 02/03/15 0634 02/04/15 0700  Weight: 242 lb (109.77 kg) 244 lb 7.8 oz (110.9 kg) 245 lb 9.5 oz (111.4 kg)    Weight change: 1 lb 1.6 oz (0.5 kg)   Intake/Output from previous day: 10/12 0701 - 10/13 0700 In: 3205.8 [I.V.:2415.8; NG/GT:350; IV Piggyback:300] Out: 2740 [Urine:2120; Emesis/NG output:500; Chest Tube:120]    Current Meds: Scheduled Meds: . acetaminophen  1,000 mg Intravenous 4 times per day  . antiseptic oral rinse  7 mL Mouth Rinse QID  . chlorhexidine  15 mL Mouth/Throat BID  . Chlorhexidine Gluconate Cloth  6 each Topical Q0600  . enoxaparin (LOVENOX) injection  40 mg Subcutaneous Q24H  . pantoprazole (PROTONIX) IV  40 mg Intravenous Q12H  . sodium chloride  10 mL Intravenous Q12H   Continuous Infusions: . dexmedetomidine Stopped (02/02/15 0600)  . dextrose 5 % and 0.45 % NaCl with KCl 20 mEq/L 100 mL/hr at 02/04/15 0737  . feeding supplement (VITAL AF 1.2 CAL) 1,000 mL (02/04/15 0400)   PRN  Meds:.hydrALAZINE, Influenza vac split quadrivalent PF, metoprolol, pneumococcal 23 valent vaccine, potassium chloride  General appearance: alert, cooperative and no distress Heart: regular rate and rhythm Lungs: diminished breath sounds bibasilar Abdomen: soft, non-tender; bowel sounds normal; no masses,  no organomegaly Wound: clean and dry, Penrose draining with mild drainage  Lab Results: CBC: Recent Labs  02/03/15 0447 02/04/15 0449  WBC 13.4* 9.6  HGB 13.9 13.0  HCT 40.5 39.1  PLT 124* 123*   BMET:  Recent Labs  02/03/15 0447 02/04/15 0449  NA 136 135  K 3.9 4.0  CL 102 104  CO2 25 27  GLUCOSE 106* 117*  BUN 13 11  CREATININE 0.76 0.65  CALCIUM 8.3* 8.1*    PT/INR: No results for input(s): LABPROT, INR in the last 72 hours. Radiology: Dg Chest Port 1 View  02/04/2015  CLINICAL DATA:  Total esophagectomy. EXAM: PORTABLE CHEST 1 VIEW COMPARISON:  Portable chest x-ray of February 03, 2015 FINDINGS: The lungs are adequately inflated. There is bibasilar interstitial density especially on the right. There is no pneumothorax or large pleural effusion. The left-sided chest tube is unchanged in position overlying the posterior medial aspect of the left seventh rib. The cardiac silhouette is mildly enlarged. The central pulmonary vascularity is prominent.  The esophagogastric tube tip projects below the inferior margin of the image. The right internal jugular venous catheter tip projects over the midportion of the SVC. The bony thorax exhibits no acute abnormality. IMPRESSION: 1. No pneumothorax or significant pleural effusion on the left. The left chest tube is unchanged in position. 2. Bibasilar subsegmental atelectasis, stable. 3. Enlargement of the cardiac silhouette with central pulmonary vascular congestion, stable. Electronically Signed   By: David  Martinique M.D.   On: 02/04/2015 07:43     Assessment/Plan: S/P Procedure(s) (LRB): TRANSHIATAL TOTAL ESOPHAGECTOMY    ([name]) PYLOROMYOTOMY JEJUNOSTOMY CERVICAL ESOPHAGOGASTROSTOMY  1. CV- Hypertensive- on Hydralazine and Lopressor prn 2. Pulm- no acute issues, continue IS, Chest tube drainage remains low 3. GI- continue NPO, tube feeds at trickle rate, patient tolerating 4. Pain control- will d/c Fentanyl in case source of itching, will switch to Dilaudid 5. Insomnia- can use iv benadryl, not taking po yet 6. Dispo- patient stable, pain medication adjusted for possible allergy, ambien for sleep, possibly transfer to stepdown today   Change to pain meds per j tube Final path still pending I have seen and examined David [name] and agree with the above assessment  and plan.  Grace Isaac MD Beeper [phone] Office [phone] 02/04/2015 10:06 AM

## 2015-02-04 NOTE — Progress Notes (Signed)
CT surgery p.m. Rounds  Patient developed a fascial-neck rash with some tightness in his throat. Patient previously had developed a rash with fentanyl. He is currently on Dilantin PCA which he has  used infrequently  today so this will be stopped. He will be given 1 dose of IV Benadryl and 80 mg solu  Medrol.  Otherwise the patient has had a stable day, has been out of bed and no nausea. Good urine output, tube feeds without difficulty

## 2015-02-05 LAB — GLUCOSE, CAPILLARY
Glucose-Capillary: 109 mg/dL — ABNORMAL HIGH (ref 65–99)
Glucose-Capillary: 114 mg/dL — ABNORMAL HIGH (ref 65–99)
Glucose-Capillary: 124 mg/dL — ABNORMAL HIGH (ref 65–99)
Glucose-Capillary: 129 mg/dL — ABNORMAL HIGH (ref 65–99)
Glucose-Capillary: 129 mg/dL — ABNORMAL HIGH (ref 65–99)
Glucose-Capillary: 149 mg/dL — ABNORMAL HIGH (ref 65–99)
Glucose-Capillary: 154 mg/dL — ABNORMAL HIGH (ref 65–99)

## 2015-02-05 MED ORDER — METOPROLOL TARTRATE 1 MG/ML IV SOLN
5.0000 mg | INTRAVENOUS | Status: DC | PRN
  Administered 2015-02-05 (×3): 5 mg via INTRAVENOUS
  Filled 2015-02-05 (×4): qty 5

## 2015-02-05 MED ORDER — POLYVINYL ALCOHOL 1.4 % OP SOLN
1.0000 [drp] | OPHTHALMIC | Status: DC | PRN
  Administered 2015-02-05: 1 [drp] via OPHTHALMIC
  Filled 2015-02-05: qty 15

## 2015-02-05 MED ORDER — LORAZEPAM 2 MG/ML IJ SOLN
1.0000 mg | Freq: Four times a day (QID) | INTRAMUSCULAR | Status: DC | PRN
  Administered 2015-02-05 – 2015-02-11 (×6): 1 mg via INTRAVENOUS
  Filled 2015-02-05 (×6): qty 1

## 2015-02-05 MED ORDER — VITAL AF 1.2 CAL PO LIQD
1000.0000 mL | ORAL | Status: DC
  Administered 2015-02-05: 1000 mL
  Filled 2015-02-05 (×6): qty 1000

## 2015-02-05 NOTE — Progress Notes (Signed)
      [address]       [place] [address]             [phone]      POD # 4 esophagectomy  Comfortable  BP 157/76 mmHg  Pulse 96  Temp(Src) 98.7 F (37.1 C) (Tympanic)  Resp 18  Ht 6\' 3"  (1.905 m)  Wt 243 lb 12.8 oz (110.587 kg)  BMI 30.47 kg/m2  SpO2 89%   Intake/Output Summary (Last 24 hours) at 02/05/15 1638 Last data filed at 02/05/15 1500  Gross per 24 hour  Intake   3030 ml  Output   2660 ml  Net    370 ml    For swallow on Monday  Madalene Mickler C. Roxan Hockey, MD [facility] [phone]

## 2015-02-05 NOTE — Progress Notes (Addendum)
TCTS DAILY ICU PROGRESS NOTE                   [address]            [place] [address]          [phone]   4 Days Post-Op Procedure(s) (LRB): TRANSHIATAL TOTAL ESOPHAGECTOMY   ([name]) PYLOROMYOTOMY JEJUNOSTOMY CERVICAL ESOPHAGOGASTROSTOMY  Total Length of Stay:  LOS: 4 days   Subjective:  David [name] has no complaints this morning.  States he is doing okay.  He did have further itching with use of Dilaudid overnight.  Objective: Vital signs in last 24 hours: Temp:  [97.4 F (36.3 C)-98.8 F (37.1 C)] 98.7 F (37.1 C) (10/14 0401) Pulse Rate:  [84-119] 105 (10/14 0600) Cardiac Rhythm:  [-] Sinus tachycardia (10/14 0630) Resp:  [9-27] 18 (10/14 0600) BP: (127-188)/(47-80) 163/68 mmHg (10/14 0700) SpO2:  [89 %-97 %] 94 % (10/14 0600) Weight:  [243 lb 12.8 oz (110.587 kg)] 243 lb 12.8 oz (110.587 kg) (10/14 0645)  Filed Weights   02/03/15 0634 02/04/15 0700 02/05/15 0645  Weight: 244 lb 7.8 oz (110.9 kg) 245 lb 9.5 oz (111.4 kg) 243 lb 12.8 oz (110.587 kg)    Weight change: -1 lb 12.7 oz (-0.813 kg)   Intake/Output from previous day: 10/13 0701 - 10/14 0700 In: 3220 [I.V.:2400; NG/GT:650] Out: 3025 [Urine:2515; Emesis/NG output:270; Drains:20; Chest Tube:220]  Current Meds: Scheduled Meds: . antiseptic oral rinse  7 mL Mouth Rinse QID  . chlorhexidine  15 mL Mouth/Throat BID  . Chlorhexidine Gluconate Cloth  6 each Topical Q0600  . enoxaparin (LOVENOX) injection  40 mg Subcutaneous Q24H  . feeding supplement (VITAL AF 1.2 CAL)  1,000 mL Per Tube Q24H  . pantoprazole (PROTONIX) IV  40 mg Intravenous Q12H  . sodium chloride  10 mL Intravenous Q12H   Continuous Infusions: . dextrose 5 % and 0.45 % NaCl with KCl 20 mEq/L 1,000 mL (02/05/15 0502)   PRN Meds:.diphenhydrAMINE, hydrALAZINE, HYDROcodone-acetaminophen, Influenza vac split quadrivalent PF, metoprolol, pneumococcal 23 valent vaccine, potassium chloride  General appearance: alert,  cooperative and no distress Heart: regular rate and rhythm Lungs: clear to auscultation bilaterally Abdomen: soft non-tender, hypoactive BS Wound: clean and dry  Lab Results: CBC: Recent Labs  02/03/15 0447 02/04/15 0449  WBC 13.4* 9.6  HGB 13.9 13.0  HCT 40.5 39.1  PLT 124* 123*   BMET:  Recent Labs  02/03/15 0447 02/04/15 0449  NA 136 135  K 3.9 4.0  CL 102 104  CO2 25 27  GLUCOSE 106* 117*  BUN 13 11  CREATININE 0.76 0.65  CALCIUM 8.3* 8.1*    PT/INR: No results for input(s): LABPROT, INR in the last 72 hours. Radiology: No results found.   Assessment/Plan: S/P Procedure(s) (LRB): TRANSHIATAL TOTAL ESOPHAGECTOMY   ([name]) PYLOROMYOTOMY JEJUNOSTOMY CERVICAL ESOPHAGOGASTROSTOMY  1.  CV- Hypertensive- on Hydralazine, will increase Lopressor to 5mg  IV Qprn 2. Pulm- no acute issues, Chest tube with 220 cc output, will leave chest tube for now 3. GI- remains NPO, will increase tube feeds to 55 per hour, patient tolerating without nausea or vomiting 4. Pain control- itching, throat tightness with Dilaudid- PCA discontinued, tolerating liquid Hydrocodone through J Tube 5. Dispo- patient stable, increase tube feeds, leave chest tube, will d/c NG tube, swallow Monday     [name], [name] 02/05/2015 7:53 AM  Feels better today, with better pain control Increasing tube feeding to max 80 as tolerated No drainage  from neck drain D/c ng today and keep npo Discussed path with patient : 9 nodes positive Cancer of lower third of esophagus ([place])   Staging form: Esophagus - Squamous Cell Carcinoma, AJCC 7th Edition     Clinical: Stage IIIB (T3, N2, M0) - Signed by Ladell Pier, MD on 10/19/2014     Pathologic stage from 02/01/2015: Stage IIIC (T3, N3, cM0, G4 - Undifferentiated, Location: Middle) - Signed by Grace Isaac, MD on 02/04/2015  gastrograffin swallow Monday- ordered Will d/c foley, has bee on flo max in past but not taking since po I have seen and  examined David [name] and agree with the above assessment  and plan.  Grace Isaac MD Beeper [phone] Office [phone] 02/05/2015 8:43 AM

## 2015-02-05 NOTE — Progress Notes (Addendum)
Nutrition Follow Up  DOCUMENTATION CODES:   Obesity unspecified  INTERVENTION:    Continue to advance Vital AF 1.2 formula to goal rate of 80 ml/hr.  Will provide 2304 kcals, 144 gm protein, 1557 ml of free water.  NUTRITION DIAGNOSIS:   Inadequate oral intake related to inability to eat (s/p esophagectomy) as evidenced by NPO status, ongoing  GOAL:   Patient will meet greater than or equal to 90% of their needs progressing  MONITOR:   TF tolerance, Diet advancement, PO intake, Labs, Weight trends, I & O's  ASSESSMENT:   74 y.o. Male is seenfor follow up treatment for esophageal cancer. Patient notedFebruary 2016 some difficulty in swallowing and painful swallowing. This acutely became a problem with food obstruction requiring endoscopy. Endoscopy confirmed a distal esophageal mass at the GE junction. Subsequent biopsy confirmed adenocarcinoma. CT scan of the chest and abdomen, PET scan and esophageal ultrasound has been performed.  Patient s/p procedures TRANSHIATAL TOTAL ESOPHAGECTOMY PYLOROMYOTOMY JEJUNOSTOMY CERVICAL ESOPHAGOGASTROSTOMY  Vital AF 1.2 formula currently infusing at 55 ml/hr via J-tube providing 1584 kcals, 99 gm protein, 1071 ml of free water.  Pt tolerating well.  No nausea or vomiting.  For gastrografin swallow Monday, 10/17.  Diet Order:  Diet NPO time specified  Skin:  Reviewed, no issues  Last BM:  [name]  Height:   Ht Readings from Last 1 Encounters:  02/05/15 6\' 3"  (1.905 m)    Weight:   Wt Readings from Last 1 Encounters:  02/05/15 243 lb 12.8 oz (110.587 kg)    Ideal Body Weight:  89 kg  BMI:  Body mass index is 30.47 kg/(m^2).  Estimated Nutritional Needs:   Kcal:  7972-8206  Protein:  135-145 gm  Fluid:  2.3-2.5 L  EDUCATION NEEDS:   No education needs identified at this time  Arthur Holms, RD, LDN Pager #: [phone] After-Hours Pager #: [phone]

## 2015-02-06 ENCOUNTER — Inpatient Hospital Stay (HOSPITAL_COMMUNITY): Payer: Medicare Other

## 2015-02-06 LAB — BASIC METABOLIC PANEL
Anion gap: 8 (ref 5–15)
BUN: 21 mg/dL — ABNORMAL HIGH (ref 6–20)
CO2: 28 mmol/L (ref 22–32)
Calcium: 8.6 mg/dL — ABNORMAL LOW (ref 8.9–10.3)
Chloride: 106 mmol/L (ref 101–111)
Creatinine, Ser: 0.78 mg/dL (ref 0.61–1.24)
GFR calc Af Amer: >60 mL/min (ref >60)
GFR calc non Af Amer: >60 mL/min (ref >60)
Glucose, Bld: 107 mg/dL — ABNORMAL HIGH (ref 65–99)
Potassium: 3.6 mmol/L (ref 3.5–5.1)
Sodium: 142 mmol/L (ref 135–145)

## 2015-02-06 LAB — CBC
HCT: 39.5 % (ref 39.0–52.0)
Hemoglobin: 13.1 g/dL (ref 13.0–17.0)
MCH: 33.6 pg (ref 26.0–34.0)
MCHC: 33.2 g/dL (ref 30.0–36.0)
MCV: 101.3 fL — ABNORMAL HIGH (ref 78.0–100.0)
Platelets: 163 10*3/uL (ref 150–400)
RBC: 3.9 MIL/uL — ABNORMAL LOW (ref 4.22–5.81)
RDW: 14.7 % (ref 11.5–15.5)
WBC: 9.1 10*3/uL (ref 4.0–10.5)

## 2015-02-06 LAB — GLUCOSE, CAPILLARY
Glucose-Capillary: 109 mg/dL — ABNORMAL HIGH (ref 65–99)
Glucose-Capillary: 103 mg/dL — ABNORMAL HIGH (ref 65–99)
Glucose-Capillary: 116 mg/dL — ABNORMAL HIGH (ref 65–99)
Glucose-Capillary: 106 mg/dL — ABNORMAL HIGH (ref 65–99)
Glucose-Capillary: 116 mg/dL — ABNORMAL HIGH (ref 65–99)

## 2015-02-06 MED ORDER — METOPROLOL TARTRATE 25 MG/10 ML ORAL SUSPENSION
12.5000 mg | Freq: Two times a day (BID) | ORAL | Status: DC
  Administered 2015-02-06 (×2): 12.5 mg
  Filled 2015-02-06 (×5): qty 5

## 2015-02-06 NOTE — Progress Notes (Signed)
Patient complained of feeling bloated , appears very uncomfortable abdomen is soft with  + bowel sound, slightly tymphanic denies passing flatus , no residual from the feeding tube noted . Feeding decrease to 30cc/hour. Patient felt relieved after several hours of feeding being titrated down. Will continue to monitor.

## 2015-02-06 NOTE — Progress Notes (Addendum)
[address]       [place] [address]             [phone]          5 Days Post-Op Procedure(s) (LRB): TRANSHIATAL TOTAL ESOPHAGECTOMY   ([name]) PYLOROMYOTOMY JEJUNOSTOMY CERVICAL ESOPHAGOGASTROSTOMY  Subjective: C/o "bloating" every time tube feeds increased. Had a lot of discomfort yesterday and was not able to tolerated TFs at 55/hr, now back at 30/hr and more comfortable.  Passing very little flatus and no BM yet.   Objective: Vital signs in last 24 hours: Patient Vitals for the past 24 hrs:  BP Temp Temp src Pulse Resp SpO2 Height Weight  02/06/15 0753 (!) 154/70 mmHg - - (!) 107 17 92 % - -  02/06/15 0700 - 98.2 F (36.8 C) Oral - - - - -  02/06/15 0500 - - - - - - - 242 lb 8.1 oz (110 kg)  02/06/15 0455 (!) 156/72 mmHg 98.3 F (36.8 C) Oral 85 16 94 % - -  02/06/15 0052 137/64 mmHg - - 93 13 (!) 88 % - -  02/05/15 2345 (!) 166/67 mmHg 98 F (36.7 C) Oral (!) 109 (!) 22 93 % - -  02/05/15 1954 - - - (!) 120 14 93 % - -  02/05/15 1947 (!) 160/54 mmHg - - (!) 116 19 90 % - -  02/05/15 1946 (!) 160/54 mmHg 98.4 F (36.9 C) Oral (!) 114 (!) 21 90 % - -  02/05/15 1844 (!) 158/75 mmHg 98 F (36.7 C) Oral (!) 117 (!) 22 93 % 6\' 3"  (1.905 m) 241 lb 2.9 oz (109.4 kg)  02/05/15 1800 (!) 167/71 mmHg - - (!) 110 17 93 % - -  02/05/15 1700 (!) 171/67 mmHg - - (!) 110 17 92 % - -  02/05/15 1600 (!) 155/68 mmHg - - 94 13 95 % - -  02/05/15 1500 (!) 157/76 mmHg - - 96 18 (!) 89 % - -  02/05/15 1400 (!) 149/69 mmHg - - (!) 112 18 93 % - -  02/05/15 1300 (!) 170/66 mmHg - - (!) 108 18 91 % - -  02/05/15 1200 (!) 161/68 mmHg - - (!) 101 (!) 22 90 % - -  02/05/15 1100 (!) 162/65 mmHg 98.7 F (37.1 C) Tympanic 96 17 90 % - -  02/05/15 1000 (!) 158/69 mmHg - - 99 18 93 % - -  02/05/15 0900 (!) 177/71 mmHg 100.1 F (37.8 C) Oral (!) 114 19 91 % - -   Current Weight  02/06/15 242 lb 8.1 oz (110 kg)     Intake/Output from previous day: 10/14 0701 -  10/15 0700 In: 2620 [I.V.:1350; NG/GT:840] Out: 1515 [Urine:1425; Emesis/NG output:50; Chest Tube:40]    PHYSICAL EXAM:  Heart: Mildly tachy around 100 with freq PVCs Lungs: Clear Abdomen: Soft, NT, slightly distended, +BS Wound: Abd/neck wounds clean and dry, no drainage from neck drain     Lab Results: CBC: Recent Labs  02/04/15 0449 02/06/15 0624  WBC 9.6 9.1  HGB 13.0 13.1  HCT 39.1 39.5  PLT 123* 163   BMET:  Recent Labs  02/04/15 0449 02/06/15 0624  NA 135 142  K 4.0 3.6  CL 104 106  CO2 27 28  GLUCOSE 117* 107*  BUN 11 21*  CREATININE 0.65 0.78  CALCIUM 8.1* 8.6*    PT/INR: No results for input(s): LABPROT, INR in the last  72 hours.  CXR: stable   Assessment/Plan: S/P Procedure(s) (LRB): TRANSHIATAL TOTAL ESOPHAGECTOMY   ([name]) PYLOROMYOTOMY JEJUNOSTOMY CERVICAL ESOPHAGOGASTROSTOMY  GI- not tolerating increase in TFs, c/o bloating. Will leave TF at current rate for now.  CV- HTN, mildly tachy with freq PVCs.  Continue prn hydralazine, Lopressor for now. Will to add scheduled dose of Lopressor via tube .  CT output low, around 40 ml/past 24 hrs. Could possibly decrease to water seal.  For swallow study on Monday.   LOS: 5 days    Arin Peral H 02/06/2015

## 2015-02-06 NOTE — Progress Notes (Signed)
Patient has increase anxiety level during rounds, HR up to the 120's and slightly Bp elevated. Noticed patient kind of asking question a lot and wanting kind of reassurance. Explained things to patient and reassurance given with diverted attention. Feeling his HR is going up. Dr. Roxan Hockey was paged and with orders made. Ativan 1mg . IV given with good relief. Patient was able to rest and sleep.

## 2015-02-07 LAB — GLUCOSE, CAPILLARY
Glucose-Capillary: 104 mg/dL — ABNORMAL HIGH (ref 65–99)
Glucose-Capillary: 104 mg/dL — ABNORMAL HIGH (ref 65–99)
Glucose-Capillary: 108 mg/dL — ABNORMAL HIGH (ref 65–99)
Glucose-Capillary: 112 mg/dL — ABNORMAL HIGH (ref 65–99)

## 2015-02-07 MED ORDER — TAMSULOSIN HCL 0.4 MG PO CAPS
0.4000 mg | ORAL_CAPSULE | Freq: Every day | ORAL | Status: DC
  Administered 2015-02-07 – 2015-02-12 (×6): 0.4 mg via ORAL
  Filled 2015-02-07 (×6): qty 1

## 2015-02-07 MED ORDER — VITAL AF 1.2 CAL PO LIQD
1000.0000 mL | ORAL | Status: DC
  Administered 2015-02-08: 1000 mL
  Filled 2015-02-07 (×4): qty 1000

## 2015-02-07 MED ORDER — METOPROLOL TARTRATE 25 MG/10 ML ORAL SUSPENSION
25.0000 mg | Freq: Two times a day (BID) | ORAL | Status: DC
  Administered 2015-02-07 – 2015-02-11 (×10): 25 mg
  Filled 2015-02-07 (×13): qty 10

## 2015-02-07 NOTE — Progress Notes (Addendum)
       [address]       [place] [address]             [phone]          6 Days Post-Op Procedure(s) (LRB): TRANSHIATAL TOTAL ESOPHAGECTOMY   ([name]) PYLOROMYOTOMY JEJUNOSTOMY CERVICAL ESOPHAGOGASTROSTOMY  Subjective: Feels much better today. Bloating resolved. No nausea.  Passing flatus and had a small BM. Walked in halls already today.   Objective: Vital signs in last 24 hours: Patient Vitals for the past 24 hrs:  BP Temp Temp src Pulse Resp SpO2 Weight  02/07/15 0444 (!) 162/98 mmHg 98.2 F (36.8 C) Oral 100 20 96 % 232 lb 5.8 oz (105.4 kg)  02/07/15 0008 (!) 142/70 mmHg 98.3 F (36.8 C) Oral 98 16 94 % -  02/06/15 2014 (!) 160/75 mmHg 98.7 F (37.1 C) Oral (!) 102 17 91 % -  02/06/15 2000 - - - (!) 101 15 91 % -  02/06/15 1947 - - - - - (!) 89 % -  02/06/15 1547 (!) 138/59 mmHg 97.9 F (36.6 C) Oral (!) 120 (!) 22 94 % -  02/06/15 1132 (!) 155/85 mmHg 98.6 F (37 C) Oral (!) 107 18 94 % -   Current Weight  02/07/15 232 lb 5.8 oz (105.4 kg)     Intake/Output from previous day: 10/15 0701 - 10/16 0700 In: 2040 [I.V.:1160; NG/GT:690] Out: 2830 [Urine:2650; Chest Tube:180]    PHYSICAL EXAM:  Heart: RRR Lungs: Clear Wound: Neck and abdominal incisions clean and dry, no drainage from L neck Abdomen: Soft. NT/ND, +BS    Lab Results: CBC: Recent Labs  02/06/15 0624  WBC 9.1  HGB 13.1  HCT 39.5  PLT 163   BMET:  Recent Labs  02/06/15 0624  NA 142  K 3.6  CL 106  CO2 28  GLUCOSE 107*  BUN 21*  CREATININE 0.78  CALCIUM 8.6*    PT/INR: No results for input(s): LABPROT, INR in the last 72 hours.    Assessment/Plan: S/P Procedure(s) (LRB): TRANSHIATAL TOTAL ESOPHAGECTOMY   ([name]) PYLOROMYOTOMY JEJUNOSTOMY CERVICAL ESOPHAGOGASTROSTOMY  GI- Symptomatically improved today. Will advance TFs slowly and watch for recurring distention.  CV- Still hypertensive and tachy at times. Will increase Lopressor via tube  .  Continue CT to water seal.  For swallow study in am.   LOS: 6 days    COLLINS,GINA H 02/07/2015  Looks better today C/o difficulty urinating, uses flomax- will order to be given via tube [employer] C. Roxan Hockey, MD [facility] [phone]

## 2015-02-08 ENCOUNTER — Encounter (HOSPITAL_COMMUNITY): Payer: Self-pay | Admitting: *Deleted

## 2015-02-08 ENCOUNTER — Inpatient Hospital Stay (HOSPITAL_COMMUNITY): Payer: Medicare Other

## 2015-02-08 LAB — GLUCOSE, CAPILLARY
Glucose-Capillary: 100 mg/dL — ABNORMAL HIGH (ref 65–99)
Glucose-Capillary: 102 mg/dL — ABNORMAL HIGH (ref 65–99)
Glucose-Capillary: 102 mg/dL — ABNORMAL HIGH (ref 65–99)
Glucose-Capillary: 118 mg/dL — ABNORMAL HIGH (ref 65–99)
Glucose-Capillary: 120 mg/dL — ABNORMAL HIGH (ref 65–99)
Glucose-Capillary: 130 mg/dL — ABNORMAL HIGH (ref 65–99)
Glucose-Capillary: 80 mg/dL (ref 65–99)

## 2015-02-08 MED ORDER — IOHEXOL 300 MG/ML  SOLN
150.0000 mL | Freq: Once | INTRAMUSCULAR | Status: DC | PRN
  Administered 2015-02-08: 100 mL via ORAL
  Filled 2015-02-08: qty 150

## 2015-02-08 NOTE — Care Management Important Message (Signed)
Important Message  Patient Details  Name: David [name] MRN: [id number] Date of Birth: 02-12-1941   Medicare Important Message Given:  Yes-third notification given    Nathen May 02/08/2015, 11:59 AM

## 2015-02-08 NOTE — Progress Notes (Addendum)
      [address]       [place] [address]             [phone]      7 Days Post-Op Procedure(s) (LRB): TRANSHIATAL TOTAL ESOPHAGECTOMY   ([name]) PYLOROMYOTOMY JEJUNOSTOMY CERVICAL ESOPHAGOGASTROSTOMY   Subjective:  David [name] has no complaints.  He had his swallow study today.  + ambulation  + BM several days ago,, + Flatus  Objective: Vital signs in last 24 hours: Temp:  [97.8 F (36.6 C)-98.2 F (36.8 C)] 97.8 F (36.6 C) (10/17 0802) Pulse Rate:  [78-111] 82 (10/17 0417) Cardiac Rhythm:  [-] Normal sinus rhythm (10/17 0018) Resp:  [10-18] 13 (10/17 0417) BP: (122-159)/(51-72) 143/71 mmHg (10/17 0417) SpO2:  [93 %-96 %] 93 % (10/17 0417) Weight:  [237 lb 6.4 oz (107.684 kg)] 237 lb 6.4 oz (107.684 kg) (10/17 0417)  Intake/Output from previous day: 10/16 0701 - 10/17 0700 In: 2030 [I.V.:1200; NG/GT:720] Out: 1575 [Urine:1475; Chest Tube:100] Intake/Output this shift: Total I/O In: -  Out: 350 [Urine:350]  General appearance: alert, cooperative and no distress Heart: regular rate and rhythm Lungs: clear to auscultation bilaterally Abdomen: soft, non-tender; bowel sounds normal; no masses,  no organomegaly Wound: clean and dry  Lab Results:  Recent Labs  02/06/15 0624  WBC 9.1  HGB 13.1  HCT 39.5  PLT 163   BMET:  Recent Labs  02/06/15 0624  NA 142  K 3.6  CL 106  CO2 28  GLUCOSE 107*  BUN 21*  CREATININE 0.78  CALCIUM 8.6*    PT/INR: No results for input(s): LABPROT, INR in the last 72 hours. ABG    Component Value Date/Time   PHART 7.354 02/02/2015 0350   HCO3 23.7 02/02/2015 0350   TCO2 25 02/02/2015 0350   ACIDBASEDEF 2.0 02/02/2015 0350   O2SAT 94.0 02/02/2015 0350   CBG (last 3)   Recent Labs  02/07/15 1955 02/08/15 0016 02/08/15 0416  GLUCAP 118* 102* 102*    Assessment/Plan: S/P Procedure(s) (LRB): TRANSHIATAL TOTAL ESOPHAGECTOMY   ([name]) PYLOROMYOTOMY JEJUNOSTOMY CERVICAL ESOPHAGOGASTROSTOMY  1.  GI- abdominal distention stable, patient tolerated increase in tube feeds yesterday currently at 40 ml/hr 2. Esophagram today- without leak, may be able to start clears  3. Pulm- no acute issues, CT with 100 cc output yesterday- can likely d/c today 4. Dispo- patient without esophageal anastomotic leak, possibly start clears today, tube feeds at 22ml/hr- will discuss further management with Dr. Servando Snare   LOS: 7 days    [name], [name] 02/08/2015  Abdomen soft Complaint of left foot pain in arch not jtoes, has history of uric acid stones and gout in the past Drinking liquids after swallow today, tolerated, no drainage from neck drain I have seen and examined David [name] and agree with the above assessment  and plan.  Grace Isaac MD Beeper [phone] Office [phone] 02/08/2015 11:57 PM

## 2015-02-08 NOTE — Progress Notes (Signed)
UR COMPLETED  

## 2015-02-09 LAB — GLUCOSE, CAPILLARY
Glucose-Capillary: 104 mg/dL — ABNORMAL HIGH (ref 65–99)
Glucose-Capillary: 113 mg/dL — ABNORMAL HIGH (ref 65–99)
Glucose-Capillary: 114 mg/dL — ABNORMAL HIGH (ref 65–99)
Glucose-Capillary: 122 mg/dL — ABNORMAL HIGH (ref 65–99)
Glucose-Capillary: 122 mg/dL — ABNORMAL HIGH (ref 65–99)
Glucose-Capillary: 99 mg/dL (ref 65–99)

## 2015-02-09 MED ORDER — VITAL AF 1.2 CAL PO LIQD
1000.0000 mL | ORAL | Status: DC
Start: 2015-02-09 — End: 2015-02-10
  Administered 2015-02-09 – 2015-02-10 (×2): 1000 mL
  Filled 2015-02-09 (×3): qty 1000

## 2015-02-09 MED ORDER — COLCHICINE 0.6 MG PO TABS
0.6000 mg | ORAL_TABLET | Freq: Every day | ORAL | Status: DC
  Administered 2015-02-09 – 2015-02-12 (×4): 0.6 mg via JEJUNOSTOMY
  Filled 2015-02-09 (×4): qty 1

## 2015-02-09 NOTE — Progress Notes (Addendum)
      [address]       [place] [address]             [phone]      8 Days Post-Op Procedure(s) (LRB): TRANSHIATAL TOTAL ESOPHAGECTOMY   ([name]) PYLOROMYOTOMY JEJUNOSTOMY CERVICAL ESOPHAGOGASTROSTOMY   Subjective:  [name] has no new complaints.  He does continue to complain of left ankle pain.  States its not getting better and it's making it difficult for him to ambulate.  He tolerated clear liquids yesterday without nausea and vomiting.   Objective: Vital signs in last 24 hours: Temp:  [98 F (36.7 C)-98.4 F (36.9 C)] 98.4 F (36.9 C) (10/18 0700) Pulse Rate:  [73-89] 89 (10/18 0400) Cardiac Rhythm:  [-] Sinus tachycardia (10/18 0805) Resp:  [14-16] 14 (10/18 0400) BP: (123-143)/(56-68) 143/63 mmHg (10/18 0400) SpO2:  [92 %-94 %] 94 % (10/18 0400) Weight:  [246 lb 4.8 oz (111.721 kg)] 246 lb 4.8 oz (111.721 kg) (10/18 0411)   Intake/Output from previous day: 10/17 0701 - 10/18 0700 In: 1962 [P.O.:120; I.V.:1200; NG/GT:440] Out: 1425 [Urine:1125; Chest Tube:300]  General appearance: alert, cooperative and no distress Heart: regular rate and rhythm Lungs: clear to auscultation bilaterally Abdomen: soft, non-tender; bowel sounds normal; no masses,  no organomegaly Extremities: Left ankle minimally swollen, no erythema Wound: clean and dry  Lab Results: No results for input(s): WBC, HGB, HCT, PLT in the last 72 hours. BMET: No results for input(s): NA, K, CL, CO2, GLUCOSE, BUN, CREATININE, CALCIUM in the last 72 hours.  PT/INR: No results for input(s): LABPROT, INR in the last 72 hours. ABG    Component Value Date/Time   PHART 7.354 02/02/2015 0350   HCO3 23.7 02/02/2015 0350   TCO2 25 02/02/2015 0350   ACIDBASEDEF 2.0 02/02/2015 0350   O2SAT 94.0 02/02/2015 0350   CBG (last 3)   Recent Labs  02/08/15 2320 02/09/15 0409 02/09/15 0731  GLUCAP 120* 104* 122*    Assessment/Plan: S/P Procedure(s) (LRB): TRANSHIATAL TOTAL  ESOPHAGECTOMY   ([name]) PYLOROMYOTOMY JEJUNOSTOMY CERVICAL ESOPHAGOGASTROSTOMY  1. Chest tube- minimal drainage initially, however per nursing after patient started drinking clears CT output picked up to about 300 cc output- will discuss management with Dr. Servando Snare 2. GI- tube feeds currently at 40 ml/hr goal is 55... Possibly increase to 50 today, will discuss with staff 3. Left ankle pain- patient's main complaint, could try gout medications vs. Ibuprofen for some relief 4. Dispo- patient stable, esophagram yesterday with no leak, however per nursing after intake of clears patients chest tube output increased... Will discuss further management with Dr. Servando Snare   LOS: 8 days    Ellwood Handler 02/09/2015  Increase tube feeding, treat for gout with colochine No drainage from neck Chest tube out today I have seen and examined [place] Nickel and agree with the above assessment  and plan.  Grace Isaac MD Beeper [phone] Office [phone] 02/09/2015 12:20 PM

## 2015-02-10 LAB — URINALYSIS, DIPSTICK ONLY
BILIRUBIN URINE: NEGATIVE
Glucose, UA: NEGATIVE mg/dL
HGB URINE DIPSTICK: NEGATIVE
KETONES UR: NEGATIVE mg/dL
Leukocytes, UA: NEGATIVE
NITRITE: NEGATIVE
Protein, ur: NEGATIVE mg/dL
Specific Gravity, Urine: 1.011 (ref 1.005–1.030)
UROBILINOGEN UA: 4 mg/dL — AB (ref 0.0–1.0)
pH: 6.5 (ref 5.0–8.0)

## 2015-02-10 LAB — GLUCOSE, CAPILLARY
Glucose-Capillary: 116 mg/dL — ABNORMAL HIGH (ref 65–99)
Glucose-Capillary: 96 mg/dL (ref 65–99)
Glucose-Capillary: 113 mg/dL — ABNORMAL HIGH (ref 65–99)
Glucose-Capillary: 102 mg/dL — ABNORMAL HIGH (ref 65–99)
Glucose-Capillary: 108 mg/dL — ABNORMAL HIGH (ref 65–99)

## 2015-02-10 MED ORDER — VITAL AF 1.2 CAL PO LIQD
1000.0000 mL | ORAL | Status: DC
Start: 2015-02-10 — End: 2015-02-12
  Administered 2015-02-11 – 2015-02-12 (×2): 1000 mL
  Filled 2015-02-10 (×4): qty 1000

## 2015-02-10 MED ORDER — VITAL AF 1.2 CAL PO LIQD
1000.0000 mL | ORAL | Status: DC
  Filled 2015-02-10 (×2): qty 1000

## 2015-02-10 NOTE — Progress Notes (Addendum)
      [address]       [place] [address]             [phone]      9 Days Post-Op Procedure(s) (LRB): TRANSHIATAL TOTAL ESOPHAGECTOMY   ([name]) PYLOROMYOTOMY JEJUNOSTOMY CERVICAL ESOPHAGOGASTROSTOMY   Subjective:  David [name] states he had a rough night.  He states he was not able to sleep.  He also states he had some diarrhea last night which he believes occurred twice.  He has increase in urination as well and his daughter is concerned he could have an infection  He questions if this could be related to Colchicine stating his wife read on the internet that it can.  His ankle pain has improved.  Objective: Vital signs in last 24 hours: Temp:  [97.4 F (36.3 C)-99.1 F (37.3 C)] 97.6 F (36.4 C) (10/19 0700) Pulse Rate:  [84-98] 84 (10/19 0510) Cardiac Rhythm:  [-] Normal sinus rhythm (10/19 0814) Resp:  [16-22] 16 (10/19 0510) BP: (138-148)/(61-76) 139/68 mmHg (10/19 0510) SpO2:  [89 %-93 %] 93 % (10/19 0510)  Intake/Output from previous day: 10/18 0701 - 10/19 0700 In: 3494.3 [P.O.:960; I.V.:1250; NG/GT:1104.3] Out: 3140 [Urine:3110; Chest Tube:30]  General appearance: alert, cooperative and no distress Heart: regular rate and rhythm Lungs: clear to auscultation bilaterally Abdomen: soft- non-disentended, hypoactive BS Wound: clean and dry.  Lab Results: No results for input(s): WBC, HGB, HCT, PLT in the last 72 hours. BMET: No results for input(s): NA, K, CL, CO2, GLUCOSE, BUN, CREATININE, CALCIUM in the last 72 hours.  PT/INR: No results for input(s): LABPROT, INR in the last 72 hours. ABG    Component Value Date/Time   PHART 7.354 02/02/2015 0350   HCO3 23.7 02/02/2015 0350   TCO2 25 02/02/2015 0350   ACIDBASEDEF 2.0 02/02/2015 0350   O2SAT 94.0 02/02/2015 0350   CBG (last 3)   Recent Labs  02/09/15 2340 02/10/15 0511 02/10/15 0741  GLUCAP 113* 116* 96    Assessment/Plan: S/P Procedure(s) (LRB): TRANSHIATAL TOTAL ESOPHAGECTOMY    ([name]) PYLOROMYOTOMY JEJUNOSTOMY CERVICAL ESOPHAGOGASTROSTOMY  1. GI- tube feeds at 20ml/hr, will increase to goal rate of 55.... On clear liquid diet, may be able to advance to full liquid today. 2. Left ankle pain- improved, continue colchicine... Diarrhea is likely related to being on tubes feeds and liquid diet... Will monitor and if diarrhea worsens can stop colchicine 3. GU- increase in urination, burning initially once foley removed, not as bad now... Will get UA 4. Dispo- patient stable, possibly d/c IV fluid as taking clears with no problems, advance diet and tube feeds   LOS: 9 days    [name], [name] 02/10/2015  Taking po well No drainage from the neck drain Advance diet  I have seen and examined David [name] and agree with the above assessment  and plan.  Grace Isaac MD Beeper [phone] Office [phone] 02/10/2015 6:30 PM

## 2015-02-11 ENCOUNTER — Other Ambulatory Visit: Payer: Self-pay | Admitting: *Deleted

## 2015-02-11 ENCOUNTER — Inpatient Hospital Stay (HOSPITAL_COMMUNITY): Payer: Medicare Other

## 2015-02-11 LAB — GLUCOSE, CAPILLARY
Glucose-Capillary: 101 mg/dL — ABNORMAL HIGH (ref 65–99)
Glucose-Capillary: 103 mg/dL — ABNORMAL HIGH (ref 65–99)
Glucose-Capillary: 106 mg/dL — ABNORMAL HIGH (ref 65–99)
Glucose-Capillary: 112 mg/dL — ABNORMAL HIGH (ref 65–99)
Glucose-Capillary: 117 mg/dL — ABNORMAL HIGH (ref 65–99)
Glucose-Capillary: 119 mg/dL — ABNORMAL HIGH (ref 65–99)

## 2015-02-11 LAB — CBC
HCT: 36.9 % — ABNORMAL LOW (ref 39.0–52.0)
Hemoglobin: 12.1 g/dL — ABNORMAL LOW (ref 13.0–17.0)
MCH: 33.1 pg (ref 26.0–34.0)
MCHC: 32.8 g/dL (ref 30.0–36.0)
MCV: 100.8 fL — ABNORMAL HIGH (ref 78.0–100.0)
Platelets: 169 10*3/uL (ref 150–400)
RBC: 3.66 MIL/uL — ABNORMAL LOW (ref 4.22–5.81)
RDW: 13.7 % (ref 11.5–15.5)
WBC: 7.7 10*3/uL (ref 4.0–10.5)

## 2015-02-11 LAB — BASIC METABOLIC PANEL
Anion gap: 7 (ref 5–15)
BUN: 11 mg/dL (ref 6–20)
CO2: 29 mmol/L (ref 22–32)
Calcium: 8.2 mg/dL — ABNORMAL LOW (ref 8.9–10.3)
Chloride: 104 mmol/L (ref 101–111)
Creatinine, Ser: 0.77 mg/dL (ref 0.61–1.24)
GFR calc Af Amer: >60 mL/min (ref >60)
GFR calc non Af Amer: >60 mL/min (ref >60)
Glucose, Bld: 99 mg/dL (ref 65–99)
Potassium: 3.7 mmol/L (ref 3.5–5.1)
Sodium: 140 mmol/L (ref 135–145)

## 2015-02-11 MED ORDER — BOOST / RESOURCE BREEZE PO LIQD
1.0000 | Freq: Three times a day (TID) | ORAL | Status: DC
  Administered 2015-02-11 – 2015-02-12 (×3): 1 via ORAL

## 2015-02-11 MED ORDER — METHOCARBAMOL 500 MG PO TABS
ORAL_TABLET | ORAL | Status: AC
  Filled 2015-02-11: qty 1

## 2015-02-11 MED ORDER — AMLODIPINE BESYLATE 5 MG PO TABS
5.0000 mg | ORAL_TABLET | Freq: Every day | ORAL | Status: DC
  Administered 2015-02-11 – 2015-02-12 (×2): 5 mg via ORAL
  Filled 2015-02-11 (×2): qty 1

## 2015-02-11 MED ORDER — HYDROMORPHONE HCL 1 MG/ML IJ SOLN
INTRAMUSCULAR | Status: AC
  Filled 2015-02-11: qty 1

## 2015-02-11 MED ORDER — OXYCODONE-ACETAMINOPHEN 5-325 MG PO TABS
ORAL_TABLET | ORAL | Status: AC
  Filled 2015-02-11: qty 2

## 2015-02-11 NOTE — Discharge Summary (Signed)
[address]       [place] [address]             [phone]              Discharge Summary  Name: David [name] DOB: Aug 29, 1940 74 y.o. 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 Hypertension   . Anxiety   . Arthritis   . Basal cell carcinoma     nose, face, back  . Food impaction of esophagus 08/20/2014  . Esophageal cancer ([place])   . Enlarged prostate   . Allergy   . Radiation   . [name]'s esophagus   . Dysrhythmia   . Kidney stone   . Urinary frequency   . GERD (gastroesophageal reflux disease)   . Wears glasses      Procedures: TRANSHIATAL TOTAL ESOPHAGECTOMY - 02/01/2015  PYLOROMYOTOMY  FEEDING JEJUNOSTOMY  CERVICAL ESOPHAGOGASTROSTOMY    HPI:  The patient is a 74 y.o. male who originally presented in February 2016 with dysphagia and painful swallowing.  He ultimately developed obstructive symptoms and required endoscopy.  Endoscopy confirmed a distal esophageal mass at the GE junction. Subsequent biopsy confirmed adenocarcinoma. CT of the chest and abdomen, PET scan and abdominal ultrasound were performed and suggested stage III esophageal cancer.  He was referred to Dr. Servando Snare for consideration of surgical resection.  Dr. Servando Snare recommended chemotherapy and radiation followed by surgery.  The patient recently completed both with good clinical response.  Follow up CT scan showed no evidence of metastatic disease, and it was recommended that the patient proceed with esophagectomy at this time.   Hospital Course:  The patient was admitted to [facility] on 02/01/2015. All risks, benefits and alternatives  of surgery were explained in detail, and the patient agreed to proceed. The patient was taken to the operating room and underwent the above procedure.    The postoperative course has been notable for hypertension, which was treated with IV Lopressor and hydralazine prn.  He remained hypertensive and tachycardic, so he was started on scheduled Lopressor via his J-tube. Tube feeds were started and he initially had some bloating and intolerance to increasing rates, but this resolved once he had a bowel movement. He was kept NPO until 10/17.  At that time, a gastrograffin swallow study was performed which showed no evidence of anastomotic leak.  He was started on sips of clear liquids, which he tolerated well.  His diet has been slowly advanced .    Overall, the patient is doing well.  He had minimal drainage from his left chest tube and it has been discontinued.  He is tolerating a soft diet at present with no dysphagia or pain.  His left neck drain has had no output and is slowly being advanced. He had some left ankle pain which was thought to be related to gout and was started on colchicine with improvement.  Incisions are healing well.  He is ambulating in the halls without difficulty. Final pathology was positive for squamous cell carcinoma (T3, N2- stage IIIb). Blood pressures have continued to be elevated and once he was taking po's, he was restarted on his home dose of Norvasc.  He is tolerating tube feeds via  his jejunostomy and was tolerating a regular diet. As a result, Dr. Servando Snare said to stop the tube feedings all together.  The patient is able to take pills as well. The penrose drain was removed on 10/21. The patient is medically stable on today's date for discharge home.   Recent vital signs:  Filed Vitals:   02/12/15 0730  BP: 155/72  Pulse: 88  Temp: 97.8 F (36.6 C)  Resp: 17    Recent laboratory studies:  CBC:  Recent Labs  02/11/15 0400  WBC 7.7  HGB 12.1*  HCT 36.9*  PLT  169   BMET:   Recent Labs  02/11/15 0400  NA 140  K 3.7  CL 104  CO2 29  GLUCOSE 99  BUN 11  CREATININE 0.77  CALCIUM 8.2*    PT/INR: No results for input(s): LABPROT, INR in the last 72 hours.   Discharge Medications:     Medication List    STOP taking these medications        furosemide 40 MG tablet  Commonly known as:  LASIX     pantoprazole 40 MG tablet  Commonly known as:  PROTONIX      TAKE these medications        ALPRAZolam 0.5 MG tablet  Commonly known as:  XANAX  Take 0.5 mg by mouth at bedtime as needed for sleep.     amLODipine 5 MG tablet  Commonly known as:  NORVASC  Take 5 mg by mouth daily.     aspirin 81 MG tablet  Take 81 mg by mouth daily.     benazepril 20 MG tablet  Commonly known as:  LOTENSIN  Take 1 tablet (20 mg total) by mouth daily.     DEEP BLUE RELIEF Gel  Apply 1 application topically daily as needed (FOR PAIN).     feeding supplement Liqd  Take 1 Container by mouth 3 (three) times daily between meals.     HYDROcodone-acetaminophen 5-325 MG tablet  Commonly known as:  NORCO  Take 1 tablet by mouth every 6 (six) hours as needed for severe pain.     metoprolol tartrate 25 MG tablet  Commonly known as:  LOPRESSOR  Take 1 tablet (25 mg total) by mouth 2 (two) times daily.     potassium citrate 10 MEQ (1080 MG) SR tablet  Commonly known as:  UROCIT-K  Take 1 tablet (10 mEq total) by mouth daily.     tamsulosin 0.4 MG Caps capsule  Commonly known as:  FLOMAX  Take 0.4 mg by mouth daily after supper.         Discharge Instructions:  The patient is to refrain from driving, heavy lifting or strenuous activity.  May shower daily and clean incisions with soap and water.  May resume regular diet.   Follow Up:  Discharge Instructions    Care order/instruction    Complete by:  As directed   Please flush feeding jejunostomy tube (red tube) with 30 cc of normal saline two times daily.           Follow-up Information     Follow up with Grace Isaac, MD On 02/23/2015.   Specialty:  Cardiothoracic Surgery   Why:  PA/LAT CXR to be taken (at [facility] which is in the same building as Dr. Everrett Coombe office) on 02/23/2015 at 3:15 pm ;Appointment time is at 4:00 pm   Contact information:   [place] [address] [phone]  Follow up with Patient.   Why:  Please flush feeding jejunostomy tube (red tube) with 30 cc of normal saline two times daily      Follow up with Betsy Coder, MD On 02/15/2015.   Specialty:  Oncology   Why:  Appointment time is at 9:30 am   Contact information:   [place] [address] [phone]        Arnoldo Lenis 02/12/2015, 9:37 AM

## 2015-02-11 NOTE — Care Management Important Message (Signed)
Important Message  Patient Details  Name: David [name] MRN: [id number] Date of Birth: May 10, 1940   Medicare Important Message Given:  Yes-fourth notification given    Nathen May 02/11/2015, 1:29 PM

## 2015-02-11 NOTE — Care Management Note (Signed)
Case Management Note  Patient Details  Name: David [name] MRN: [id number] Date of Birth: 1941-01-20  Subjective/Objective:              Admitted with esophageal ca , s/p esophagectomy with cervical esophagogastrostomy, pyloromyotomy and feeding jejunostomy tube 02/01/2015. Independent with ADL's. No DME. Lives with wife.    Action/Plan: Return to home when medically stable. CM to f/u with d/c disposition.  Expected Discharge Date:                  Expected Discharge Plan:  Home/Self Care  In-House Referral:     Discharge planning Services  CM Consult  Post Acute Care Choice:    Choice offered to:     DME Arranged:    DME Agency:     HH Arranged:    HH Agency:     Status of Service:  In process, will continue to follow  Medicare Important Message Given:   Date Medicare IM Given:    Medicare IM give by:    Date Additional Medicare IM Given:    Additional Medicare Important Message give by:     If discussed at [place] of Stay Meetings, dates discussed:    Additional Comments: Percell Lamboy (Spouse)  [phone]  Whitman Hero [place], RN, Durel Salts [phone] 02/11/2015, 3:32 PM

## 2015-02-11 NOTE — Progress Notes (Addendum)
       [name]       [name] [address]             [phone]          10 Days Post-Op Procedure(s) (LRB): TRANSHIATAL TOTAL ESOPHAGECTOMY   ([name]) PYLOROMYOTOMY JEJUNOSTOMY CERVICAL ESOPHAGOGASTROSTOMY  Subjective: Feels well, wants to go home. Tolerating fulls. No dysphagia.    Objective: Vital signs in last 24 hours: Patient Vitals for the past 24 hrs:  BP Temp Temp src Pulse Resp SpO2 Height Weight  02/11/15 0800 - 98 F (36.7 C) Oral - - - - -  02/11/15 0530 (!) 140/58 mmHg - - 83 14 (!) 89 % - -  02/11/15 0523 - 97.8 F (36.6 C) Oral - - - - -  02/11/15 0405 - - - - - - 6\' 3"  (1.905 m) 244 lb 11.4 oz (111 kg)  02/11/15 0010 (!) 152/69 mmHg - - 81 17 90 % - -  02/11/15 0006 - 98 F (36.7 C) Oral - - - - -  02/10/15 2023 (!) 151/67 mmHg - - (!) 107 (!) 35 92 % 6\' 3"  (1.905 m) 245 lb 8 oz (111.358 kg)  02/10/15 2011 - 98.4 F (36.9 C) Oral - - - - -  02/10/15 1551 - 98.7 F (37.1 C) Oral - - - - -  02/10/15 1550 (!) 164/76 mmHg - - 87 19 92 % - -  02/10/15 1106 (!) 141/69 mmHg 97.7 F (36.5 C) Oral - 17 94 % - -   Current Weight  02/11/15 244 lb 11.4 oz (111 kg)     Intake/Output from previous day: 10/19 0701 - 10/20 0700 In: 1316.7 [P.O.:60; NG/GT:1196.7] Out: 1554 [Urine:1327; Stool:2; Blood:225]    PHYSICAL EXAM:  Heart: RRR Lungs: Clear Abdomen: Soft, NT/ND, +BS Wound: Clean and dry, no drainage from L neck     Lab Results: CBC: Recent Labs  02/11/15 0400  WBC 7.7  HGB 12.1*  HCT 36.9*  PLT 169   BMET:  Recent Labs  02/11/15 0400  NA 140  K 3.7  CL 104  CO2 29  GLUCOSE 99  BUN 11  CREATININE 0.77  CALCIUM 8.2*    PT/INR: No results for input(s): LABPROT, INR in the last 72 hours.  U/a negative   Assessment/Plan: S/P Procedure(s) (LRB): TRANSHIATAL TOTAL ESOPHAGECTOMY   ([name]) PYLOROMYOTOMY JEJUNOSTOMY CERVICAL ESOPHAGOGASTROSTOMY Tolerating fulls. Hopefully can advance to soft regular diet  and start to decrease TFs. Penrose in neck with no drainage, can likely advance drain. HTN- BPs trending up, will resume po Norvasc.  Continues to progress well. Hopefully home soon.   LOS: 10 days    COLLINS,GINA H 02/11/2015  Drained advanced out, increase po diet  Feet not tender anymore Poss home in am I have seen and examined David [name] and agree with the above assessment  and plan.  Grace Isaac MD Beeper [phone] Office [phone] 02/11/2015 12:53 PM

## 2015-02-11 NOTE — Progress Notes (Signed)
Nutrition Follow-up  DOCUMENTATION CODES:   Obesity unspecified  INTERVENTION:    Boost Breeze PO TID, each supplement provides 250 kcal and 9 grams of protein  Recommend decrease Vital AF 1.2 rate to 40 ml/h to provide 1152 kcals, 72 gm protein, 779 ml free water daily (~50% of estimated needs).  NUTRITION DIAGNOSIS:   Inadequate oral intake related to altered GI function as evidenced by meal completion < 50%.  Ongoing  GOAL:   Patient will meet greater than or equal to 90% of their needs  Met  MONITOR:   Diet advancement, PO intake, Supplement acceptance, Labs, Weight trends, TF tolerance  REASON FOR ASSESSMENT:   Consult Enteral/tube feeding initiation and management  ASSESSMENT:   74 y.o. Male is seenfor follow up treatment for esophageal cancer. Patient notedFebruary 2016 some difficulty in swallowing and painful swallowing. This acutely became a problem with food obstruction requiring endoscopy. Endoscopy confirmed a distal esophageal mass at the GE junction. Subsequent biopsy confirmed adenocarcinoma. CT scan of the chest and abdomen, PET scan and esophageal ultrasound has been performed  Patient s/p procedures TRANSHIATAL TOTAL ESOPHAGECTOMY PYLOROMYOTOMY JEJUNOSTOMY CERVICAL ESOPHAGOGASTROSTOMY  Patient is currently receiving Vital AF 1.2 via J tube at 50 ml/h providing 1440 kcals, 90 gm protein, 973 ml free water daily. Patient reports good appetite. Consuming </= 50% of full liquid meals. Diet has been advanced to soft diet today. Agreed to try Boost Breeze supplements to maximize oral intake between meals.   Diet Order:  DIET SOFT Room service appropriate?: Yes; Fluid consistency:: Thin  Skin:  Reviewed, no issues  Last BM:  10/19  Height:   Ht Readings from Last 1 Encounters:  02/11/15 '6\' 3"'  (1.905 m)    Weight:   Wt Readings from Last 1 Encounters:  02/11/15 244 lb 11.4 oz (111 kg)    Ideal Body Weight:  89 kg  BMI:  Body mass  index is 30.59 kg/(m^2).  Estimated Nutritional Needs:   Kcal:  9563-8756  Protein:  135-145 gm  Fluid:  2.3-2.5 L  EDUCATION NEEDS:   No education needs identified at this time  Molli Barrows, [place], [place], [place] Pager [phone] After Hours Pager [phone]

## 2015-02-12 ENCOUNTER — Telehealth: Payer: Self-pay | Admitting: Oncology

## 2015-02-12 LAB — GLUCOSE, CAPILLARY
Glucose-Capillary: 104 mg/dL — ABNORMAL HIGH (ref 65–99)
Glucose-Capillary: 98 mg/dL (ref 65–99)
Glucose-Capillary: 101 mg/dL — ABNORMAL HIGH (ref 65–99)
Glucose-Capillary: 140 mg/dL — ABNORMAL HIGH (ref 65–99)

## 2015-02-12 MED ORDER — HYDROCODONE-ACETAMINOPHEN 5-325 MG PO TABS
1.0000 | ORAL_TABLET | Freq: Four times a day (QID) | ORAL | Status: DC | PRN
Start: 2015-02-12 — End: 2015-09-02

## 2015-02-12 MED ORDER — BOOST / RESOURCE BREEZE PO LIQD: 1.0000 | Freq: Three times a day (TID) | ORAL | Status: DC

## 2015-02-12 MED ORDER — PANTOPRAZOLE SODIUM 40 MG PO TBEC
40.0000 mg | DELAYED_RELEASE_TABLET | Freq: Every day | ORAL | Status: DC
  Administered 2015-02-12: 40 mg via ORAL
  Filled 2015-02-12: qty 1

## 2015-02-12 MED ORDER — METOPROLOL TARTRATE 25 MG PO TABS
25.0000 mg | ORAL_TABLET | Freq: Two times a day (BID) | ORAL | Status: DC
  Administered 2015-02-12: 25 mg via ORAL
  Filled 2015-02-12 (×2): qty 1

## 2015-02-12 MED ORDER — BENAZEPRIL HCL 20 MG PO TABS: 20.0000 mg | ORAL_TABLET | Freq: Every day | ORAL | Status: DC

## 2015-02-12 MED ORDER — POTASSIUM CITRATE ER 10 MEQ (1080 MG) PO TBCR: 10.0000 meq | EXTENDED_RELEASE_TABLET | Freq: Every day | ORAL | Status: DC

## 2015-02-12 MED ORDER — METOPROLOL TARTRATE 25 MG PO TABS: 25.0000 mg | ORAL_TABLET | Freq: Two times a day (BID) | ORAL | Status: DC

## 2015-02-12 NOTE — Telephone Encounter (Signed)
s.w. pt and advised cx appt moved to 10.31 pt ok and aware

## 2015-02-12 NOTE — Discharge Instructions (Signed)
We ask the patient to please flush the feeding jejunostomy tube (red tube) with 30 cc of normal saline two times daily  EATING AFTER YOUR ESOPHAGEAL SURGERY  After your esophageal surgery, you can expect some difficulty swallowing.  If food sticks when you eat, it is called "dysphagia".  This is due to swelling around your surgery site and will most likely resolve within a few weeks.  To help you through this temporary phase, we start you out on a pureed diet.  Your first meal in the hospital was clear liquids.  You should have been given a pureed diet by the time you left the hospital.  We ask patients to stay on a pureed diet for the first two weeks to avoid anything getting "stuck" near your recent surgery.  Don't be alarmed if your ability to swallow doesn't progress according to this plan.  Everyone is different and some take longer or shorter.  Use common sense.  If you are having trouble swallowing a particular food, then avoid it.  If food is sticking when you advance your diet, go back to the previous day or two.  In general some simple rules to follow are:  Maintain an upright position (as near 90 degrees as possible) whenever eating or drinking.  Take small bites - only 1/2 to 1 teaspoon at a time.  Eat slowly.  It may also help to eat only one food at a time.  Avoid talking while eating.  Do not mix solid foods and liquids in the same mouthful and do not "wash foods down" with liquids, unless you have been instructed to do so by your surgeon.  Eat in a relaxed atmosphere, with no distractions.  Following each meal, sit in an upright position (90 degree angle) for 30 to 45 minutes.  Avoid carbonated (bubbly) drinks.  If food does stick, don't panic.  Try to relax and let the food pass on its own.  Sipping strong hot black tea can also help.  If you have any questions please call our office at [phone].   LEVEL 1 PUREED FOODS:  1ST 2 WEEKS AFTER SURGERY Foods in this  group are pureed or blenderized to a smooth, mashed potato-like consistency.  If necessary, the pureed foods can keep their shape with the addition of a thickening agent.  Meat should be pureed to a smooth pasty consistency.  Hot broth or gravy may be added to the pureed meat, approximately 1 oz. of liquid per 3 oz. serving of meat. CAUTION:  If any foods do not puree into a smooth consistency, it may make eating for swallowing more difficult.  For example, zucchini seeds sometimes do not blend well. Hot Foods Cold Foods  Pureed scrambled eggs and cheese Pureed cottage cheese  Baby cereals Thickened juices and nectars  Thinned cooked cereals (no lumps) Thickened milk or eggnog  Pureed [place] toast or pancakes Ensure  Mashed potatoes Ice cream  Pureed parsley, au gratin, scalloped potatoes, candied sweet potatoes Fruit or [place] ice, sherbet  Pureed buttered or alfredo noodles Plain yogurt  Pureed vegetables (no corn or peas) Instant breakfast  Pureed soups and creamed soups Smooth pudding, mousse, custard  Pureed scalloped apples Whipped gelatin  Gravies Sugar, syrup, honey, jelly  Sauces, cheese, tomato, barbecue, white, creamed Cream  Any baby food Creamer  Alcohol in moderation (not beer or champagne) Margarine  Coffee or tea Mayonnaise   Ketchup, mustard   Apple sauce   SAMPLE MENU:  PUREED DIET  Breakfast Lunch Dinner   Orange juice, 1/2 cup  Cream of wheat, 1/2 cup  Pineapple juice, 1/2 cup  Pureed [place], barley soup, 3/4 cup  Pureed Hawaiian chicken, 3 oz   Scrambled eggs, mashed or blended with cheese, 1/2 cup  Tea or coffee, 1 cup   Whole milk, 1 cup   Non-dairy creamer, 2 Tbsp.  Mashed potatoes, 1/2 cup  Pureed cooled broccoli, 1/2 cup  Apple sauce, 1/2 cup  Coffee or tea  Mashed potatoes, 1/2 cup  Pureed spinach, 1/2 cup  Frozen yogurt, 1/2 cup  Tea or coffee    LEVEL 2 After your first 2 weeks, you can advance to a soft diet.  Keep on this diet  until everything goes down easily. Hot Foods Cold Foods  White fish Cottage cheese  Stuffed fish Junior baby fruit  Baby food meals Semi thickened juices  Minced soft cooked, scrambled, poached eggs nectars  Souffle & omelets Ripe mashed bananas  Cooked cereals Canned fruit, pineapple sauce, milk  potatoes Milkshake  Buttered or Alfredo noodles Custard  Cooked cooled vegetable Puddings, including tapioca  Sherbet Yogurt  Vegetable soup or alphabet soup Fruit ice, [place] ice  Gravies Whipped gelatin  Sugar, syrup, honey, jelly Junior baby desserts  Sauces:  Cheese, creamed, barbecue, tomato, white Cream  Coffee or tea Margarine   SAMPLE MENU:  LEVEL 2 Breakfast Lunch Dinner   Orange juice, 1/2 cup  Oatmeal, 1/2 cup  Scrambled eggs with cheese, 1/2 cup  Decaffeinated tea, 1 cup  Whole milk, 1 cup  Non-dairy creamer, 2 Tbsp  Pineapple juice, 1/2 cup  Minced beef, 3 oz  Gravy, 2 Tbsp  Mashed potatoes, 1/2 cup  Minced fresh broccoli, 1/2 cup  Applesauce, 1/2 cup  Coffee, 1 cup  [place], barley soup, 3/4 cup  Minced Hawaiian chicken, 3 oz  Mashed potatoes, 1/2 cup  Cooked spinach, 1/2 cup  Frozen yogurt, 1/2 cup  Non-dairy creamer, 2 Tbsp    LEVEL 3 After all the foods in level 2 (soft diet) are passing through well you should advance up to the next level.  It is still important to cut these foods into small pieces and eat slowly. Hot Foods Cold Foods  Poultry Cottage cheese  Chopped Swedish meatballs Yogurt  Meat salads (ground or flaked meat) Milk  Flaked fish (tuna) Milkshakes  Poached or scrambled eggs Soft, cold, dry cereal  Souffles and omelets Fruit juices or nectars  Cooked cereals Chopped canned fruit  Chopped [place] toast or pancakes Canned fruit cocktail  Noodles or pasta (no rice) Pudding, mousse, custard  Cooked vegetables (no frozen peas, corn, or mixed vegetables) Green salad  Canned small sweet peas Ice cream  Creamed soup or vegetable  soup Fruit ice, [place] ice  Pureed vegetable soup or alphabet soup Non-dairy creamer  Ground scalloped apples Margarine  Gravies Mayonnaise  Sauces:  Cheese, creamed, barbecue, tomato, white Ketchup  Coffee or tea Mustard   SAMPLE MENU:  LEVEL 3 Breakfast Lunch Dinner   Orange juice, 1/2 cup  Oatmeal, 1/2 cup  Scrambled eggs with cheese, 1/2 cup  Decaffeinated tea, 1 cup  Whole milk, 1 cup  Non-dairy creamer, 2 Tbsp  Ketchup, 1 Tbsp  Margarine, 1 tsp  Salt, 1/4 tsp  Sugar, 2 tsp  Pineapple juice, 1/2 cup  Ground beef, 3 oz  Gravy, 2 Tbsp  Mashed potatoes, 1/2 cup  Cooked spinach, 1/2 cup  Applesauce, 1/2 cup  Decaffeinated coffee  Whole milk  Non-dairy creamer,  2 Tbsp  Margarine, 1 tsp  Salt, 1/4 tsp  Pureed [place], barley soup, 3/4 cup  Barbecue chicken, 3 oz  Mashed potatoes, 1/2 cup  Ground fresh broccoli, 1/2 cup  Frozen yogurt, 1/2 cup  Decaffeinated tea, 1 cup  Non-dairy creamer, 2 Tbsp  Margarine, 1 tsp  Salt, 1/4 tsp  Sugar, 1 tsp    LEVEL 4:  REGULAR FOODS Foods in this group are soft, moist, regularly textured foods.  This level includes red meat and breads, which tend to be the hardest things to swallow.  Eat very slow, chew well and continue to avoid carbonated drinks. Hot Foods Cold Foods  Baked fish or skinned Soft cheeses - cottage cheese  Souffles and omelets Cream cheese  Eggs Yogurt  Stuffed shells Milk  Spaghetti with meat sauce Milkshakes  Cooked cereal Cold dry cereals (no nuts, dried fruit, coconut)  [place] toast or pancakes Crackers  Buttered toast Fruit juices or nectars  Noodles or pasta (no rice) Canned fruit  Potatoes (all types) Ripe bananas  Soft, cooked vegetables (no corn, lima, or baked beans) Peeled, ripe, fresh fruit  Creamed soups or vegetable soup Cakes (no nuts, dried fruit, coconut)  Canned chicken noodle soup Plain doughnuts  Gravies Ice cream  Bacon dressing Pudding, mousse, custard    Sauces:  Cheese, creamed, barbecue, tomato, white Fruit ice, [place] ice, sherbet  Decaffeinated tea or coffee Whipped gelatin  Pork chops Regular gelatin   Canned fruited gelatin molds   Sugar, syrup, honey, jam, jelly   Cream   Non-dairy   Margarine   Oil   Mayonnaise   Ketchup   Mustard

## 2015-02-12 NOTE — Progress Notes (Addendum)
      [address]       [place], [address]             [phone]       11 Days Post-Op Procedure(s) (LRB): TRANSHIATAL TOTAL ESOPHAGECTOMY   ([name]) PYLOROMYOTOMY JEJUNOSTOMY CERVICAL ESOPHAGOGASTROSTOMY  Subjective: Patient tolerated a regular diet last night (pork chop). No nausea or emesis.  Objective: Vital signs in last 24 hours: Temp:  [97.8 F (36.6 C)-98.4 F (36.9 C)] 97.8 F (36.6 C) (10/21 0413) Pulse Rate:  [84-107] 85 (10/21 0413) Cardiac Rhythm:  [-] Normal sinus rhythm (10/21 0413) Resp:  [14-23] 14 (10/21 0413) BP: (130-159)/(63-82) 130/68 mmHg (10/21 0413) SpO2:  [88 %-94 %] 91 % (10/21 0413) Weight:  [232 lb (105.235 kg)] 232 lb (105.235 kg) (10/21 0413)     Intake/Output from previous day: 10/20 0701 - 10/21 0700 In: 580 [NG/GT:550] Out: 2150 [Urine:2150]   Physical Exam:  Cardiovascular: RRR Pulmonary: Clear to auscultation bilaterally. Abdomen: Soft, non tender, bowel sounds present. Wounds: Clean and dry.  No erythema or signs of infection.   Lab Results: CBC: Recent Labs  02/11/15 0400  WBC 7.7  HGB 12.1*  HCT 36.9*  PLT 169   BMET:  Recent Labs  02/11/15 0400  NA 140  K 3.7  CL 104  CO2 29  GLUCOSE 99  BUN 11  CREATININE 0.77  CALCIUM 8.2*    PT/INR: No results for input(s): LABPROT, INR in the last 72 hours. ABG:  INR: Will add last result for INR, ABG once components are confirmed Will add last 4 CBG results once components are confirmed  Assessment/Plan:  1. CV - SR. On Norvasc 5 mg daily and Lopressor suspension 25 mg bid. 2.  Pulmonary - Encourage incentive spirometer 3.GI-On TFs and tolerating a soft diet. Per Dr. Servando Snare, stop TFs, continue soft diet. 4. Penrose drain removed, as instructed by Dr. Servando Snare 5. Remove central line 6. Flush J tube bid with saline 7. Per Dr. Servando Snare, will discharge home today.  ZIMMERMAN,DONIELLE MPA-C 02/12/2015,8:24 AM  Wounds healed well, no  drainage from neck drain site. Took po diet yesterday well Plan home today I have seen and examined David [name] and agree with the above assessment  and plan.  Grace Isaac MD Beeper [phone] Office [phone] 02/12/2015 10:54 AM

## 2015-02-12 NOTE — Progress Notes (Signed)
Patient discharge prescriptions and instructions given and explained to patient, patient's wife and daughter. Educated patient and family about how to flush jejunostomy tube. Patient, patient's wife and daughter demonstrated skill and performed teachback. Educated about dressing changes. Supplies provided to patient. All questions answered. Belongings and walker sent home with patient. Peggy, NT discharged patient via wheelchair.

## 2015-02-15 ENCOUNTER — Ambulatory Visit: Payer: Medicare Other | Admitting: Oncology

## 2015-02-19 ENCOUNTER — Other Ambulatory Visit: Payer: Self-pay | Admitting: Cardiothoracic Surgery

## 2015-02-19 DIAGNOSIS — C159 Malignant neoplasm of esophagus, unspecified: Secondary | ICD-10-CM

## 2015-02-22 ENCOUNTER — Telehealth: Payer: Self-pay | Admitting: Oncology

## 2015-02-22 ENCOUNTER — Ambulatory Visit (HOSPITAL_BASED_OUTPATIENT_CLINIC_OR_DEPARTMENT_OTHER): Payer: Medicare Other | Admitting: Oncology

## 2015-02-22 ENCOUNTER — Encounter: Payer: Self-pay | Admitting: Cardiothoracic Surgery

## 2015-02-22 VITALS — BP 154/60 | HR 94 | Temp 98.5°F | Resp 18 | Ht 75.0 in | Wt 229.3 lb

## 2015-02-22 VITALS — Ht 75.0 in

## 2015-02-22 DIAGNOSIS — C155 Malignant neoplasm of lower third of esophagus: Secondary | ICD-10-CM

## 2015-02-22 DIAGNOSIS — R531 Weakness: Secondary | ICD-10-CM

## 2015-02-22 NOTE — Telephone Encounter (Signed)
per pof to sch pt appt-gave pt copy of avs °

## 2015-02-22 NOTE — Progress Notes (Signed)
  [facility] OFFICE PROGRESS NOTE   Diagnosis: Esophagus cancer  INTERVAL HISTORY:   David [name] returns as scheduled. He underwent a transhiatal esophagectomy with a cervical esophagogastrostomy placement of a feeding tube on 02/01/2015. He was discharged on 02/11/2015. Mr. Mckey reports tolerating a regular diet. He has early satiety. The pathology from the esophagectomy ([id number]) revealed a poorly differentiated invasive adenocarcinoma measuring 2 cm,ypT3 and 8 of 9 lymph nodes contained metastatic carcinoma. Lymphovascular and perineural invasion were not identified. The proximal margin was negative. Adenocarcinoma was present at the gastric resection margin not identified on the frozen section. A treatment effect was noted in the tumor and lymph nodes.    Objective:  Vital signs in last 24 hours:  Blood pressure 154/60, pulse 94, temperature 98.5 F (36.9 C), temperature source Oral, resp. rate 18, height 6\' 3"  (1.905 m), weight 229 lb 4.8 oz (104.01 kg), SpO2 95 %.    HEENT: Neck without mass Lymphatics: No cervical or supra-clavicular nodes Resp: Lungs clear bilaterally Cardio: Regular rate and rhythm GI: No hepatomegaly, healed midline incision, left upper quadrant feeding tube site Vascular: No leg edema   Lab Results:  Lab Results  Component Value Date   WBC 7.7 02/11/2015   HGB 12.1* 02/11/2015   HCT 36.9* 02/11/2015   MCV 100.8* 02/11/2015   PLT 169 02/11/2015   NEUTROABS 2.5 12/30/2014     Medications: I have reviewed the patient's current medications.  Assessment/Plan: 1. Adenocarcinoma of the distal esophagus,uT3uN  Presenting with a food impaction and dysphagia 08/20/2014  Staging CT scans of the chest, abdomen, and pelvis 10/06/2014 with indeterminate pulmonary nodules, a 9 mm paraesophageal lymph node, and circumferential thickening of the distal esophagus  PET scan 10/23/2014 confirmed hypermetabolic thickening at the distal  esophagus with small paraesophageal nodes having faint nonspecific activity. A pleural-based focus of consolidation at the posterior right lower lobe had low metabolic activity-favored to be a benign process.  Initiation of concurrent weekly Taxol/carboplatin and radiation on 11/02/2014, radiation completed 12/09/2014  CTs 12/31/2014 revealed distal esophageal wall thickening with adjacent small lymph nodes suspicious for metastases, stable lung nodule posterior to the right bronchus intermedius and the persistent 5 mm nodule in the right lower lobe  Esophagectomy 02/01/2015 with the pathology confirming a ypT3,ypN3 tumor, positive distal margin  2. Prostatic hypertrophy  3. Hypertension  4. History of multiple basal cell skin cancers  5. History of Thrombocytopenia secondary to chemotherapy    Disposition:  Mr. Knittle continues to recover from the esophagectomy. He is tolerating a diet but reports feeling "weak ".  I reviewed the details of the surgical pathology report with Mr. Montesinos and his family. He has a high chance of developing recurrent esophagus cancer over the next few years based on the tumor stage and multiple positive lymph nodes.  I explained there is no clear data to support the use of adjuvant chemotherapy in this setting. However some oncologists recommend adjuvant FOLFOX chemotherapy in patients having multiple positive lymph nodes after receiving neoadjuvant therapy. We discussed the FOLFOX schedule and need for a Port-A-Cath. He will return for an office visit and further discussion on 03/05/2015.  Betsy Coder, MD  02/22/2015  3:31 PM

## 2015-02-23 ENCOUNTER — Ambulatory Visit
Admission: RE | Admit: 2015-02-23 | Discharge: 2015-02-23 | Disposition: A | Discharge status: Home / Self Care | Payer: Medicare Other | Source: Ambulatory Visit | Attending: Cardiothoracic Surgery | Admitting: Cardiothoracic Surgery

## 2015-02-23 ENCOUNTER — Ambulatory Visit (INDEPENDENT_AMBULATORY_CARE_PROVIDER_SITE_OTHER): Payer: Self-pay | Admitting: Cardiothoracic Surgery

## 2015-02-23 ENCOUNTER — Encounter: Payer: Self-pay | Admitting: Cardiothoracic Surgery

## 2015-02-23 ENCOUNTER — Ambulatory Visit: Payer: Medicare Other | Admitting: Cardiothoracic Surgery

## 2015-02-23 VITALS — BP 129/70 | HR 61 | Resp 16 | Ht 75.0 in | Wt 230.0 lb

## 2015-02-23 DIAGNOSIS — C160 Malignant neoplasm of cardia: Secondary | ICD-10-CM

## 2015-02-23 DIAGNOSIS — J90 Pleural effusion, not elsewhere classified: Secondary | ICD-10-CM | POA: Diagnosis not present

## 2015-02-23 DIAGNOSIS — C159 Malignant neoplasm of esophagus, unspecified: Secondary | ICD-10-CM

## 2015-02-23 NOTE — Progress Notes (Signed)
This encounter was created in error - please disregard.

## 2015-02-23 NOTE — Progress Notes (Signed)
[address]       [place] [address]             [phone]      David [name]  Medical Record #[id number] Date of Birth: Jun 11, 1940  Referring: Ladell Pier, MD Primary Care: Marton Redwood, MD  Chief Complaint:   POST OP FOLLOW UP  History of Present Illness:     Cancer of lower third of esophagus [facility])   Staging form: Esophagus - Squamous Cell Carcinoma, AJCC 7th Edition     Clinical: Stage IIIB (T3, N2, M0) - Signed by Ladell Pier, MD on 10/19/2014     Pathologic stage from 02/01/2015: Stage IIIC (T3, N3, cM0, G4 - Undifferentiated, Location: Middle) - Signed by Grace Isaac, MD on 02/04/2015  02/01/2015  OPERATIVE REPORT PREOPERATIVE DIAGNOSIS: 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 SURGICAL PROCEDURE: Transhiatal total esophagectomy with cervical esophagogastrostomy, pyloromyotomy and feeding jejunostomy tube. SURGEON: Lanelle Bal, MD        Wt Readings from Last 3 Encounters:  02/23/15 230 lb (104.327 kg)  02/22/15 229 lb 4.8 oz (104.01 kg)  02/12/15 232 lb (105.235 kg)     Past Medical History  Diagnosis Date  . Hypertension   . Anxiety   . Arthritis   . Basal cell carcinoma     nose, face, back  . Food impaction of esophagus 08/20/2014  . Esophageal cancer ([place])   . Enlarged prostate   . Allergy   . Radiation   . [name]'s esophagus   . Dysrhythmia   . Kidney stone   . Urinary frequency   . GERD (gastroesophageal reflux disease)   . Wears glasses      History  Smoking status  . Former Smoker  . Quit date: 06/07/1961  Smokeless tobacco  . Never Used    History  Alcohol Use No     Allergies  Allergen Reactions  . Dilaudid [Hydromorphone Hcl] Hives and Itching  . Fentanyl Itching  .  Nexium [Esomeprazole Magnesium] Other (See Comments)    Difficulty urinating and passing stool; dry mouth  . Other     Thinks it was oxycodone; made him hallucinate  . Oxycodone Nausea And Vomiting  . Statins Other (See Comments)    Leg cramping    Current Outpatient Prescriptions  Medication Sig Dispense Refill  . ALPRAZolam (XANAX) 0.5 MG tablet Take 0.5 mg by mouth at bedtime as needed for sleep.   4  . amLODipine (NORVASC) 5 MG tablet Take 5 mg by mouth daily.  0  . benazepril (LOTENSIN) 20 MG tablet Take 1 tablet (20 mg total) by mouth daily. (Patient taking differently: Take 40 mg by mouth daily. ) 30 tablet 1  . HYDROcodone-acetaminophen (NORCO) 5-325 MG tablet Take 1 tablet by mouth every 6 (six) hours as needed for severe pain. 30 tablet 0  . Liniments (DEEP BLUE RELIEF) GEL Apply 1 application topically daily as needed (FOR PAIN).    Marland Kitchen metoprolol tartrate (LOPRESSOR) 25 MG tablet Take 1 tablet (25 mg total) by mouth 2 (two) times daily. 60 tablet 1  . pantoprazole (PROTONIX) 20 MG tablet Take 40 mg by mouth daily.    . Tamsulosin HCl (FLOMAX) 0.4 MG CAPS Take 0.4 mg by mouth daily after supper.    Marland Kitchen aspirin 81 MG tablet Take 81 mg  by mouth daily.    . feeding supplement (BOOST / RESOURCE BREEZE) LIQD Take 1 Container by mouth 3 (three) times daily between meals. (Patient not taking: Reported on 02/23/2015)  0  . potassium citrate (UROCIT-K) 10 MEQ (1080 MG) SR tablet Take 1 tablet (10 mEq total) by mouth daily. (Patient not taking: Reported on 02/22/2015)     No current facility-administered medications for this visit.       Physical Exam: BP 129/70 mmHg  Pulse 61  Resp 16  Ht 6\' 3"  (1.905 m)  Wt 230 lb (104.327 kg)  BMI 28.75 kg/m2  SpO2 96%  General appearance: alert, cooperative and no distress Neurologic: intact Heart: regular rate and rhythm, S1, S2 normal, no murmur, click, rub or gallop Lungs: clear to auscultation bilaterally Abdomen: soft, non-tender; bowel  sounds normal; no masses,  no organomegaly Extremities: extremities normal, atraumatic, no cyanosis or edema and Homans sign is negative, no sign of DVT Wound: Abdominal and left neck incision well-healed without signs of infection, jejunostomy feeding tube is in place.   Diagnostic Studies & Laboratory data:     Recent Radiology Findings:   Dg Chest 2 View  02/23/2015  CLINICAL DATA:  History esophageal cancer status post radiation and chemotherapy with recent surgery. Patient complains of weakness. EXAM: CHEST  2 VIEW COMPARISON:  February 19, 2015 FINDINGS: The heart size and mediastinal contours are stable. The heart size is enlarged. There are small bilateral pleural effusions. No focal pneumonia or pulmonary edema is identified. The visualized skeletal structures are stable. IMPRESSION: Small bilateral pleural effusions. Electronically Signed   By: Abelardo Diesel M.D.   On: 02/23/2015 16:45      Recent Lab Findings: Lab Results  Component Value Date   WBC 7.7 02/11/2015   HGB 12.1* 02/11/2015   HCT 36.9* 02/11/2015   PLT 169 02/11/2015   GLUCOSE 99 02/11/2015   ALT 29 01/28/2015   AST 31 01/28/2015   NA 140 02/11/2015   K 3.7 02/11/2015   CL 104 02/11/2015   CREATININE 0.77 02/11/2015   BUN 11 02/11/2015   CO2 29 02/11/2015   INR 1.06 01/28/2015      Assessment / Plan:      Patient is approximately 3 weeks postop following transhiatal total esophagectomy, with pathologic stage IIIC disease with 8 of 9 positive lymph nodes, and microscopic positive gastric margin not present on frozen section but noted on permanent section. The patient is doing well postoperatively, Taking diet by mouth relatively well, he has some trouble with large pills, but has not been using his jejunostomy tube for feeding. His weight has been stable. He will continue to ground up his Benzapril since it is a large pill and use the jejunostomy tube. Otherwise he'll continue on a by mouth diet  The  patient discussed with Dr. Benay Spice proceeding with course of FOLFOX postoperatively because of the advanced stage and multiple positive lymph nodes.   I plan to see him back in 2 weeks, and if he continues to progress well he see his jejunostomy tube.     Grace Isaac MD      [address] [place] [place] [address] Office [phone]   Beeper [phone]  02/23/2015 6:05 PM

## 2015-03-05 ENCOUNTER — Ambulatory Visit (HOSPITAL_BASED_OUTPATIENT_CLINIC_OR_DEPARTMENT_OTHER): Payer: Medicare Other | Admitting: Nurse Practitioner

## 2015-03-05 ENCOUNTER — Telehealth: Payer: Self-pay | Admitting: Oncology

## 2015-03-05 ENCOUNTER — Encounter: Payer: Self-pay | Admitting: *Deleted

## 2015-03-05 VITALS — BP 129/64 | HR 65 | Temp 97.9°F | Resp 17 | Ht 75.0 in | Wt 221.0 lb

## 2015-03-05 DIAGNOSIS — C155 Malignant neoplasm of lower third of esophagus: Secondary | ICD-10-CM | POA: Diagnosis not present

## 2015-03-05 NOTE — Progress Notes (Signed)
Oncology Nurse Navigator Documentation  Oncology Nurse Navigator Flowsheets 03/05/2015  Navigator Encounter Type 3 month  Patient Visit Type Medonc  Treatment Phase Treatment  Barriers/Navigation Needs Family concerns--weight loss  Interventions Referrals;Coordination of Care  Referrals Nutrition/dietician for feeding tube orders due to weight loss  Coordination of Care Other--Epic message to Dr. Servando Snare regarding weight loss and appetite  Time Spent with Patient 30

## 2015-03-05 NOTE — Telephone Encounter (Signed)
GAVE PATIENT AVS REPORT AND APPOINTMENTS FOR November AND December  °

## 2015-03-05 NOTE — Progress Notes (Addendum)
[facility] OFFICE PROGRESS NOTE   Diagnosis:  Esophagus cancer  INTERVAL HISTORY:   Mr. Binks returns as scheduled. Appetite varies. He is tolerating a regular diet. He is not using the feeding tube. He has had weight loss over the past week. Bowels moving. No significant pain.  Objective:  Vital signs in last 24 hours:  Blood pressure 129/64, pulse 65, temperature 97.9 F (36.6 C), temperature source Oral, resp. rate 17, height 6\' 3"  (1.905 m), weight 221 lb (100.245 kg), SpO2 96 %.    HEENT: No thrush or ulcers. Resp: Lungs clear bilaterally. Cardio: Regular rate and rhythm. GI: Abdomen soft and nontender. No hepatomegaly. Left upper quadrant feeding tube site without erythema. Healed midline incision. Vascular: No leg edema.  Lab Results:  Lab Results  Component Value Date   WBC 7.7 02/11/2015   HGB 12.1* 02/11/2015   HCT 36.9* 02/11/2015   MCV 100.8* 02/11/2015   PLT 169 02/11/2015   NEUTROABS 2.5 12/30/2014    Imaging:  No results found.  Medications: I have reviewed the patient's current medications.  Assessment/Plan: 1. Adenocarcinoma of the distal esophagus,uT3uN  Presenting with a food impaction and dysphagia 08/20/2014  Staging CT scans of the chest, abdomen, and pelvis 10/06/2014 with indeterminate pulmonary nodules, a 9 mm paraesophageal lymph node, and circumferential thickening of the distal esophagus  PET scan 10/23/2014 confirmed hypermetabolic thickening at the distal esophagus with small paraesophageal nodes having faint nonspecific activity. A pleural-based focus of consolidation at the posterior right lower lobe had low metabolic activity-favored to be a benign process.  Initiation of concurrent weekly Taxol/carboplatin and radiation on 11/02/2014, radiation completed 12/09/2014  CTs 12/31/2014 revealed distal esophageal wall thickening with adjacent small lymph nodes suspicious for metastases, stable lung nodule posterior to the  right bronchus intermedius and the persistent 5 mm nodule in the right lower lobe  Esophagectomy 02/01/2015 with the pathology confirming a ypT3,ypN3 tumor, positive distal margin  2. Prostatic hypertrophy  3. Hypertension  4. History of multiple basal cell skin cancers  5. History of thrombocytopenia secondary to chemotherapy    Disposition: Mr. Smalls continues to recover from surgery. He has lost weight since his last visit. The feeding tube is not being utilized at present. We made a referral to the [facility] dietitian.  Dr. Benay Spice discussed adjuvant FOLFOX chemotherapy versus observation with Mr. Wessman and his family. Dr. Benay Spice recommends observation. Mr. Fahim is in agreement with this plan.  We scheduled a return visit in approximately one month. He will follow his weight at home and contact us with further weight loss. He will try to maximize nutrition orally but realizes he may need to begin tube feedings if the weight loss continues.  Patient seen with Dr. Benay Spice. 25 minutes were spent face-to-face at today's visit with the majority of that time involved in counseling/coordination of care.  Ned Card ANP/GNP-BC   03/05/2015  11:35 AM  This was a shared visit with Ned Card. He will be referred to the [facility] nutrition service to initiate tube feedings.  We discussed the indication for adjuvant FOLFOX. I explained the lack of data to support the use of FOLFOX in this setting. I discussed the case with the GI oncology service at [facility]. They would not recommend adjuvant FOLFOX in his case. I offered to refer Mr. Patry for a formal second opinion.  He does not wish to go for a second opinion. He will be followed with observation.  Julieanne Manson, M.D.

## 2015-03-10 ENCOUNTER — Ambulatory Visit: Payer: Medicare Other | Admitting: Nutrition

## 2015-03-10 DIAGNOSIS — N200 Calculus of kidney: Secondary | ICD-10-CM | POA: Diagnosis not present

## 2015-03-10 NOTE — Progress Notes (Signed)
74 year old male diagnosed with cancer of the lower third of his esophagus.  He is status post surgery and is now under observation by Dr. Julieanne Manson.  Past medical history includes hypertension, anxiety, food impaction in the esophagus, [name]'s esophagus and GERD.  Medications include Xanax, and protonix  Labs were reviewed.  Height: 6 foot 3 inches. Weight: 223.4 pounds. Usual body weight: 242 pounds October 6. BMI: 27.92. (overweight)  Patient has a feeding tube but has not had to use it for nutrition support since being discharged from the hospital. Weight has improved.  2 pounds over the past week. Patient reports occasional nausea but this is resolved when he takes nausea medication.   He complains of increased gas with certain foods. He has a metallic taste. Patient consumes a regular diet and consumes 4 meals/snacks daily. Patient is interested in information on healthy diet to prevent cancer recurrence  Estimated nutrition needs: 2300-2500 calories, 135-145 grams protein, 2.3 L fluid.  Nutrition diagnosis:  Food and nutrition related knowledge deficit related to esophageal cancer and associated treatments as evidenced by no prior need for nutrition related information.  Intervention: Educated patient on strategies for increasing total calories and protein consuming healthy plant-based diet with increased protein foods. Educated patient about sugar and cancer at his request. Assisted patient with developing appropriate meals and snacks. Patient does not require tube feeding at this time.  I expect he can consume adequate calories and protein by mouth Multiple fact sheets were provided. Questions were answered.  Teach back method was used.  Contact information was given.  Monitoring, evaluation, goals: Patient will continue to consume a regular plant-based diet with adequate protein to promote continued healing and weight maintenance.  Next visit: Patient to contact me  for questions.  **Disclaimer: This note was dictated with voice recognition software. Similar sounding words can inadvertently be transcribed and this note may contain transcription errors which may not have been corrected upon publication of note.**

## 2015-03-11 ENCOUNTER — Encounter: Payer: Self-pay | Admitting: Cardiothoracic Surgery

## 2015-03-11 ENCOUNTER — Ambulatory Visit (INDEPENDENT_AMBULATORY_CARE_PROVIDER_SITE_OTHER): Payer: Self-pay | Admitting: Cardiothoracic Surgery

## 2015-03-11 VITALS — BP 123/72 | HR 75 | Resp 16 | Ht 75.0 in | Wt 223.0 lb

## 2015-03-11 DIAGNOSIS — C160 Malignant neoplasm of cardia: Secondary | ICD-10-CM

## 2015-03-11 DIAGNOSIS — Z09 Encounter for follow-up examination after completed treatment for conditions other than malignant neoplasm: Secondary | ICD-10-CM

## 2015-03-11 MED ORDER — FOSINOPRIL SODIUM 20 MG PO TABS: 20.0000 mg | ORAL_TABLET | Freq: Every day | ORAL | Status: DC

## 2015-03-11 MED ORDER — METOPROLOL TARTRATE 25 MG PO TABS: 25.0000 mg | ORAL_TABLET | Freq: Two times a day (BID) | ORAL | Status: DC

## 2015-03-11 MED ORDER — PROCHLORPERAZINE MALEATE 10 MG PO TABS: 10.0000 mg | ORAL_TABLET | Freq: Four times a day (QID) | ORAL | Status: DC | PRN

## 2015-03-11 NOTE — Progress Notes (Signed)
[address]       [place], [address]             [phone]      [place] A [place] Medical Record [id number] Date of Birth: 06-23-1940  Referring: Ladell Pier, MD Primary Care: Marton Redwood, MD  Chief Complaint:   POST OP FOLLOW UP  History of Present Illness:     Cancer of lower third of esophagus 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 SURGICAL PROCEDURE: Transhiatal total esophagectomy with cervical esophagogastrostomy, pyloromyotomy and feeding jejunostomy tube. SURGEON: Lanelle Bal, MD  Patient stable postop. Has been taking po diet well. Some initial trouble with milk, eating frequent small meals. No troubling swallowing.      Wt Readings from Last 3 Encounters:  03/11/15 223 lb (101.152 kg)  03/10/15 223 lb 6.4 oz (101.334 kg)  03/05/15 221 lb (100.245 kg)     Past Medical History  Diagnosis Date  . Hypertension   . Anxiety   . Arthritis   . Basal cell carcinoma     nose, face, back  . Food impaction of esophagus 08/20/2014  . Esophageal cancer ([place])   . Enlarged prostate   . Allergy   . Radiation   . [name]'s esophagus   . Dysrhythmia   . Kidney stone   . Urinary frequency   . GERD (gastroesophageal reflux disease)   . Wears glasses      History  Smoking status  . Former Smoker  . Quit date: 06/07/1961  Smokeless tobacco  . Never Used      History  Alcohol Use No     Allergies  Allergen Reactions  . Dilaudid [Hydromorphone Hcl] Hives and Itching  . Fentanyl Itching  . Nexium [Esomeprazole Magnesium] Other (See Comments)    Difficulty urinating and passing stool; dry mouth  . Other     Thinks it was oxycodone; made him hallucinate  . Oxycodone Nausea And Vomiting  . Statins Other (See Comments)    Leg cramping    Current Outpatient Prescriptions  Medication Sig Dispense Refill  . ALPRAZolam (XANAX) 0.5 MG tablet Take 0.5 mg by mouth at bedtime as needed for sleep.   4  . amLODipine (NORVASC) 5 MG tablet Take 5 mg by mouth daily.  0  . aspirin 81 MG tablet Take 81 mg by mouth daily.    Marland Kitchen HYDROcodone-acetaminophen (NORCO) 5-325 MG tablet Take 1 tablet by mouth every 6 (six) hours as needed for severe pain. 30 tablet 0  . Liniments (DEEP BLUE RELIEF) GEL Apply 1 application topically daily as needed (FOR PAIN).    Marland Kitchen metoprolol tartrate (LOPRESSOR) 25 MG tablet Take 1 tablet (25 mg total) by mouth 2 (two) times daily. 60 tablet 3  . pantoprazole (PROTONIX) 20 MG tablet Take 40 mg by mouth daily.    . Tamsulosin HCl (FLOMAX) 0.4 MG CAPS Take 0.4 mg by mouth daily after supper.    . fosinopril (  MONOPRIL) 20 MG tablet Take 1 tablet (20 mg total) by mouth daily. 30 tablet 3  . potassium citrate (UROCIT-K) 10 MEQ (1080 MG) SR tablet Take 1 tablet (10 mEq total) by mouth daily. (Patient not taking: Reported on 02/22/2015)    . prochlorperazine (COMPAZINE) 10 MG tablet Take 1 tablet (10 mg total) by mouth every 6 (six) hours as needed for nausea or vomiting. 30 tablet 0   No current facility-administered medications for this visit.       Physical Exam: BP 123/72 mmHg  Pulse 75  Resp 16  Ht 6\' 3"  (1.905 m)  Wt 223 lb (101.152 kg)  BMI 27.87 kg/m2  SpO2 96%  General appearance: alert, cooperative and no distress Neurologic: intact Heart: regular rate and rhythm, S1, S2 normal, no murmur, click, rub or  gallop Lungs: clear to auscultation bilaterally Abdomen: soft, non-tender; bowel sounds normal; no masses,  no organomegaly Extremities: extremities normal, atraumatic, no cyanosis or edema and Homans sign is negative, no sign of DVT Wound: Abdominal and left neck incision well-healed without signs of infection, jejunostomy feeding tube is in place. J tube removed in the office today without problem  Diagnostic Studies & Laboratory data:     Recent Radiology Findings:   No results found.    Recent Lab Findings: Lab Results  Component Value Date   WBC 7.7 02/11/2015   HGB 12.1* 02/11/2015   HCT 36.9* 02/11/2015   PLT 169 02/11/2015   GLUCOSE 99 02/11/2015   ALT 29 01/28/2015   AST 31 01/28/2015   NA 140 02/11/2015   K 3.7 02/11/2015   CL 104 02/11/2015   CREATININE 0.77 02/11/2015   BUN 11 02/11/2015   CO2 29 02/11/2015   INR 1.06 01/28/2015      Assessment / Plan:      Patient is approximately 4  weeks postop following transhiatal total esophagectomy, with pathologic stage IIIC disease with 8 of 9 positive lymph nodes, and microscopic positive gastric margin not present on frozen section but noted on permanent section. Feeding J tube removed today in the office Patient changed to Monopril from benazepril because the size of the pill Will see back in 4-5 weeks Has decided not to proceed with folfox     Grace Isaac MD      [address] [place] [address] Office [phone]   Beeper [phone]  03/11/2015 10:04 PM

## 2015-03-14 ENCOUNTER — Other Ambulatory Visit: Payer: Self-pay | Admitting: Physician Assistant

## 2015-03-16 DIAGNOSIS — C159 Malignant neoplasm of esophagus, unspecified: Secondary | ICD-10-CM | POA: Diagnosis not present

## 2015-03-16 DIAGNOSIS — R634 Abnormal weight loss: Secondary | ICD-10-CM | POA: Diagnosis not present

## 2015-03-16 DIAGNOSIS — Z98891 History of uterine scar from previous surgery: Secondary | ICD-10-CM | POA: Diagnosis not present

## 2015-03-16 DIAGNOSIS — Z6828 Body mass index (BMI) 28.0-28.9, adult: Secondary | ICD-10-CM | POA: Diagnosis not present

## 2015-03-16 DIAGNOSIS — I10 Essential (primary) hypertension: Secondary | ICD-10-CM | POA: Diagnosis not present

## 2015-03-16 DIAGNOSIS — R42 Dizziness and giddiness: Secondary | ICD-10-CM | POA: Diagnosis not present

## 2015-03-16 DIAGNOSIS — R5383 Other fatigue: Secondary | ICD-10-CM | POA: Diagnosis not present

## 2015-04-08 ENCOUNTER — Other Ambulatory Visit: Payer: Self-pay | Admitting: Physician Assistant

## 2015-04-15 ENCOUNTER — Encounter: Payer: Self-pay | Admitting: Cardiothoracic Surgery

## 2015-04-15 ENCOUNTER — Ambulatory Visit (INDEPENDENT_AMBULATORY_CARE_PROVIDER_SITE_OTHER): Payer: Self-pay | Admitting: Cardiothoracic Surgery

## 2015-04-15 VITALS — BP 119/72 | HR 70 | Resp 20 | Ht 75.0 in | Wt 218.0 lb

## 2015-04-15 DIAGNOSIS — Z09 Encounter for follow-up examination after completed treatment for conditions other than malignant neoplasm: Secondary | ICD-10-CM

## 2015-04-15 DIAGNOSIS — C160 Malignant neoplasm of cardia: Secondary | ICD-10-CM

## 2015-04-15 NOTE — Progress Notes (Signed)
[address]       ,[place] [address]             [phone]      Levan A Vasallo [place] Medical Record [id number] Date of Birth: 07-28-1940  Referring: Ladell Pier, MD Primary Care: Marton Redwood, MD  Chief Complaint:   POST OP FOLLOW UP  History of Present Illness:     Cancer of lower third of esophagus [facility])   Staging form: Esophagus - Squamous Cell Carcinoma, AJCC 7th Edition     Clinical: Stage IIIB (T3, N2, M0) - Signed by Ladell Pier, MD on 10/19/2014     Pathologic stage from 02/01/2015: Stage IIIC (T3, N3, cM0, G4 - Undifferentiated, Location: Middle) - Signed by Grace Isaac, MD on 02/04/2015  02/01/2015  OPERATIVE REPORT PREOPERATIVE DIAGNOSIS: 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 SURGICAL PROCEDURE: Transhiatal total esophagectomy with cervical esophagogastrostomy, pyloromyotomy and feeding jejunostomy tube. SURGEON: Lanelle Bal, MD  Patient stable postop. Has been taking po diet well. Some initial trouble with milk, eating frequent small meals. No troubling swallowing. He notes one episode of significant reflux at night, since has been much more careful about eating before lying down and sleeping with the head of the bed elevated for in a reclining chair. He's been tolerating a diet reasonably well without swallowing difficulties.     Wt Readings from Last 3 Encounters:  04/16/15 217 lb (98.431 kg)  04/15/15 218 lb (98.884 kg)  03/11/15 223 lb (101.152 kg)     Past Medical History  Diagnosis Date  . Hypertension   . Anxiety   . Arthritis   . Basal cell carcinoma     nose, face, back  . Food impaction of esophagus 08/20/2014  . Esophageal cancer ([place])   . Enlarged prostate   . Allergy   . Radiation   .  [name]'s esophagus   . Dysrhythmia   . Kidney stone   . Urinary frequency   . GERD (gastroesophageal reflux disease)   . Wears glasses      History  Smoking status  . Former Smoker  . Quit date: 06/07/1961  Smokeless tobacco  . Never Used    History  Alcohol Use No     Allergies  Allergen Reactions  . Dilaudid [Hydromorphone Hcl] Hives and Itching  . Fentanyl Itching  . Nexium [Esomeprazole Magnesium] Other (See Comments)    Difficulty urinating and passing stool; dry mouth  . Other     Thinks it was oxycodone; made him hallucinate  . Oxycodone Nausea And Vomiting  . Statins Other (See Comments)    Leg cramping    Current Outpatient Prescriptions  Medication Sig Dispense Refill  . ALPRAZolam (XANAX) 0.5 MG tablet Take 0.5 mg by mouth at bedtime as needed for sleep.   4  . amLODipine (NORVASC) 5 MG tablet Take 5 mg by mouth daily.  0  . aspirin 81 MG tablet Take 81 mg by mouth daily.    . fosinopril (MONOPRIL) 20 MG tablet Take 1 tablet (20 mg total) by mouth daily. 30 tablet 3  . HYDROcodone-acetaminophen (NORCO) 5-325 MG tablet Take 1 tablet by mouth every 6 (six) hours as needed for severe pain. 30 tablet 0  . Liniments (DEEP BLUE RELIEF) GEL Apply 1 application topically daily as needed (FOR PAIN).    Marland Kitchen  metoprolol tartrate (LOPRESSOR) 25 MG tablet TAKE 1 TABLET BY MOUTH TWICE A DAY 60 tablet 1  . pantoprazole (PROTONIX) 20 MG tablet Take 40 mg by mouth daily.    . prochlorperazine (COMPAZINE) 10 MG tablet Take 1 tablet (10 mg total) by mouth every 6 (six) hours as needed for nausea or vomiting. 30 tablet 0  . Tamsulosin HCl (FLOMAX) 0.4 MG CAPS Take 0.4 mg by mouth daily after supper.    . sodium bicarbonate 325 MG tablet Take 325 mg by mouth 3 (three) times daily. 2 pills TID     No current facility-administered medications for this visit.       Physical Exam: BP 119/72 mmHg  Pulse 70  Resp 20  Ht 6\' 3"  (1.905 m)  Wt 218 lb (98.884 kg)  BMI 27.25 kg/m2   SpO2 97%  General appearance: alert, cooperative and no distress Neurologic: intact Heart: regular rate and rhythm, S1, S2 normal, no murmur, click, rub or gallop Lungs: clear to auscultation bilaterally Abdomen: soft, non-tender; bowel sounds normal; no masses,  no organomegaly Extremities: extremities normal, atraumatic, no cyanosis or edema and Homans sign is negative, no sign of DVT Wound: Abdominal and left neck incision well-healed without signs of infection, jejunostomy feeding tube is in place. Site of the previous J-tube is well-healed, he has no evidence of incisional hernias  Diagnostic Studies & Laboratory data:     Recent Radiology Findings:   No results found.    Recent Lab Findings: Lab Results  Component Value Date   WBC 7.7 02/11/2015   HGB 12.1* 02/11/2015   HCT 36.9* 02/11/2015   PLT 169 02/11/2015   GLUCOSE 99 02/11/2015   ALT 29 01/28/2015   AST 31 01/28/2015   NA 140 02/11/2015   K 3.7 02/11/2015   CL 104 02/11/2015   CREATININE 0.77 02/11/2015   BUN 11 02/11/2015   CO2 29 02/11/2015   INR 1.06 01/28/2015      Assessment / Plan:      Patient is approximately 10 weeks postop following transhiatal total esophagectomy, with pathologic stage IIIC disease with 8 of 9 positive lymph nodes, and microscopic positive gastric margin not present on frozen section but noted on permanent section. He'll return to the office in 6 weeks    Grace Isaac MD      [address] [place] [address] Office [phone]   Beeper [phone]  04/16/2015 4:59 PM

## 2015-04-16 ENCOUNTER — Telehealth: Payer: Self-pay | Admitting: Oncology

## 2015-04-16 ENCOUNTER — Ambulatory Visit (HOSPITAL_BASED_OUTPATIENT_CLINIC_OR_DEPARTMENT_OTHER): Payer: Medicare Other | Admitting: Oncology

## 2015-04-16 VITALS — BP 126/63 | HR 66 | Temp 97.7°F | Resp 18 | Ht 75.0 in | Wt 217.0 lb

## 2015-04-16 DIAGNOSIS — C155 Malignant neoplasm of lower third of esophagus: Secondary | ICD-10-CM

## 2015-04-16 NOTE — Telephone Encounter (Signed)
Gave patient avs report and appointment for March  °

## 2015-04-16 NOTE — Progress Notes (Signed)
  [facility] OFFICE PROGRESS NOTE   Diagnosis:  Esophagus cancer  INTERVAL HISTORY:    David [name] returns as scheduled. His appetite is improving. He has been active. He has gone hunting. No pain.  Objective:  Vital signs in last 24 hours:  Blood pressure 126/63, pulse 66, temperature 97.7 F (36.5 C), temperature source Oral, resp. rate 18, height 6\' 3"  (1.905 m), weight 217 lb (98.431 kg), SpO2 98 %.    HEENT:  Neck without mass Lymphatics:  No cervical, supra-clavicular, or axillary nodes Resp:  Lungs clear bilaterally Cardio:  Regular rate and rhythm GI:  No hepatomegaly, nontender, no mass Vascular:  No leg edema     Medications: I have reviewed the patient's current medications.  Assessment/Plan: 1. Adenocarcinoma of the distal esophagus,uT3uN  Presenting with a food impaction and dysphagia 08/20/2014  Staging CT scans of the chest, abdomen, and pelvis 10/06/2014 with indeterminate pulmonary nodules, a 9 mm paraesophageal lymph node, and circumferential thickening of the distal esophagus  PET scan 10/23/2014 confirmed hypermetabolic thickening at the distal esophagus with small paraesophageal nodes having faint nonspecific activity. A pleural-based focus of consolidation at the posterior right lower lobe had low metabolic activity-favored to be a benign process.  Initiation of concurrent weekly Taxol/carboplatin and radiation on 11/02/2014, radiation completed 12/09/2014  CTs 12/31/2014 revealed distal esophageal wall thickening with adjacent small lymph nodes suspicious for metastases, stable lung nodule posterior to the right bronchus intermedius and the persistent 5 mm nodule in the right lower lobe  Esophagectomy 02/01/2015 with the pathology confirming a ypT3,ypN3 tumor, positive distal margin  2. Prostatic hypertrophy  3. Hypertension  4. History of multiple basal cell skin cancers  5. History of thrombocytopenia secondary to  chemotherapy    Disposition:   David [name] appears well. He is being followed with observation following an esophagectomy procedure. He will return for an office visit in 3 months. David [name] is scheduled to see Dr. Servando Snare in the interim.  Betsy Coder, MD  04/16/2015  11:01 AM

## 2015-05-05 DIAGNOSIS — Z0000 Encounter for general adult medical examination without abnormal findings: Secondary | ICD-10-CM | POA: Diagnosis not present

## 2015-05-05 DIAGNOSIS — N200 Calculus of kidney: Secondary | ICD-10-CM | POA: Diagnosis not present

## 2015-05-27 ENCOUNTER — Ambulatory Visit (INDEPENDENT_AMBULATORY_CARE_PROVIDER_SITE_OTHER): Payer: Medicare Other | Admitting: Cardiothoracic Surgery

## 2015-05-27 ENCOUNTER — Encounter: Payer: Self-pay | Admitting: Cardiothoracic Surgery

## 2015-05-27 VITALS — BP 133/79 | HR 70 | Resp 16 | Ht 75.0 in | Wt 210.5 lb

## 2015-05-27 DIAGNOSIS — Z09 Encounter for follow-up examination after completed treatment for conditions other than malignant neoplasm: Secondary | ICD-10-CM | POA: Diagnosis not present

## 2015-05-27 DIAGNOSIS — C160 Malignant neoplasm of cardia: Secondary | ICD-10-CM | POA: Diagnosis not present

## 2015-05-27 NOTE — Progress Notes (Signed)
[address]       ,[place] [address]             [phone]      David [name] [place] Medical Record [id number] Date of Birth: Feb 17, 1941  Referring: Ladell Pier, MD Primary Care: Marton Redwood, MD  Chief Complaint:   POST OP FOLLOW UP  History of Present Illness:     Cancer of lower third of esophagus [facility])   Staging form: Esophagus - Squamous Cell Carcinoma, AJCC 7th Edition     Clinical: Stage IIIB (T3, N2, M0) - Signed by Ladell Pier, MD on 10/19/2014     Pathologic stage from 02/01/2015: Stage IIIC (T3, N3, cM0, G4 - Undifferentiated, Location: Middle) - Signed by Grace Isaac, MD on 02/04/2015  02/01/2015  OPERATIVE REPORT PREOPERATIVE DIAGNOSIS: 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 SURGICAL PROCEDURE: Transhiatal total esophagectomy with cervical esophagogastrostomy, pyloromyotomy and feeding jejunostomy tube. SURGEON: Lanelle Bal, MD  Patient stable postop. Has been taking po diet well. Some initial trouble with milk, eating frequent small meals. No troubling swallowing. He notes one episode of significant reflux at night, since has been much more careful about eating before lying down and sleeping with the head of the bed elevated for in a reclining chair. He's been tolerating a diet reasonably well without swallowing difficulties.     Wt Readings from Last 3 Encounters:  05/27/15 210 lb 8 oz (95.482 kg)  04/16/15 217 lb (98.431 kg)  04/15/15 218 lb (98.884 kg)     Past Medical History  Diagnosis Date  . Hypertension   . Anxiety   . Arthritis   . Basal cell carcinoma     nose, face, back  . Food impaction of esophagus 08/20/2014  . Esophageal cancer ([place])   . Enlarged prostate   . Allergy   . Radiation   .  [name]'s esophagus   . Dysrhythmia   . Kidney stone   . Urinary frequency   . GERD (gastroesophageal reflux disease)   . Wears glasses      History  Smoking status  . Former Smoker  . Quit date: 06/07/1961  Smokeless tobacco  . Never Used    History  Alcohol Use No     Allergies  Allergen Reactions  . Dilaudid [Hydromorphone Hcl] Hives and Itching  . Fentanyl Itching  . Nexium [Esomeprazole Magnesium] Other (See Comments)    Difficulty urinating and passing stool; dry mouth  . Other     Thinks it was oxycodone; made him hallucinate  . Oxycodone Nausea And Vomiting  . Statins Other (See Comments)    Leg cramping    Current Outpatient Prescriptions  Medication Sig Dispense Refill  . ALPRAZolam (XANAX) 0.5 MG tablet Take 0.5 mg by mouth at bedtime as needed for sleep.   4  . amLODipine (NORVASC) 5 MG tablet Take 5 mg by mouth daily.  0  . aspirin 81 MG tablet Take 81 mg by mouth daily.    . fosinopril (MONOPRIL) 20 MG tablet Take 1 tablet (20 mg total) by mouth daily. 30 tablet 3  . HYDROcodone-acetaminophen (NORCO) 5-325 MG tablet Take 1 tablet by mouth every 6 (six) hours as needed for severe pain. 30 tablet 0  . Liniments (DEEP BLUE RELIEF) GEL Apply 1 application topically daily as needed (FOR PAIN).    Marland Kitchen  metoprolol tartrate (LOPRESSOR) 25 MG tablet TAKE 1 TABLET BY MOUTH TWICE A DAY 60 tablet 1  . pantoprazole (PROTONIX) 20 MG tablet Take 40 mg by mouth daily.    . prochlorperazine (COMPAZINE) 10 MG tablet Take 1 tablet (10 mg total) by mouth every 6 (six) hours as needed for nausea or vomiting. 30 tablet 0  . sodium bicarbonate 325 MG tablet Take 325 mg by mouth 3 (three) times daily. 2 pills TID    . Tamsulosin HCl (FLOMAX) 0.4 MG CAPS Take 0.4 mg by mouth daily after supper.     No current facility-administered medications for this visit.       Physical Exam: BP 133/79 mmHg  Pulse 70  Resp 16  Ht 6\' 3"  (1.905 m)  Wt 210 lb 8 oz (95.482 kg)  BMI 26.31  kg/m2  SpO2 97%  General appearance: alert, cooperative and no distress Neurologic: intact Heart: regular rate and rhythm, S1, S2 normal, no murmur, click, rub or gallop Lungs: clear to auscultation bilaterally Abdomen: soft, non-tender; bowel sounds normal; no masses,  no organomegaly Extremities: extremities normal, atraumatic, no cyanosis or edema and Homans sign is negative, no sign of DVT Wound: Abdominal and left neck incision well-healed without signs of infection, jejunostomy feeding tube is in place. Site of the previous J-tube is well-healed, he has no evidence of incisional hernias  Diagnostic Studies & Laboratory data:     Recent Radiology Findings:   No results found.    Recent Lab Findings: Lab Results  Component Value Date   WBC 7.7 02/11/2015   HGB 12.1* 02/11/2015   HCT 36.9* 02/11/2015   PLT 169 02/11/2015   GLUCOSE 99 02/11/2015   ALT 29 01/28/2015   AST 31 01/28/2015   NA 140 02/11/2015   K 3.7 02/11/2015   CL 104 02/11/2015   CREATININE 0.77 02/11/2015   BUN 11 02/11/2015   CO2 29 02/11/2015   INR 1.06 01/28/2015   Wt Readings from Last 3 Encounters:  05/27/15 210 lb 8 oz (95.482 kg)  04/16/15 217 lb (98.431 kg)  04/15/15 218 lb (98.884 kg)     Assessment / Plan:      Patient is approximately 16 weeks postop following transhiatal total esophagectomy, with pathologic stage IIIC disease with 8 of 9 positive lymph nodes, and microscopic positive gastric margin not present on frozen section but noted on permanent section. He'll return to the office in 6 weeks    Grace Isaac MD      [address] [place] [address] Office [phone]   Beeper [phone]  05/27/2015 3:11 PM

## 2015-06-07 ENCOUNTER — Other Ambulatory Visit: Payer: Self-pay | Admitting: *Deleted

## 2015-06-07 DIAGNOSIS — I10 Essential (primary) hypertension: Secondary | ICD-10-CM

## 2015-06-07 MED ORDER — METOPROLOL TARTRATE 25 MG PO TABS: 25.0000 mg | ORAL_TABLET | Freq: Two times a day (BID) | ORAL | Status: DC

## 2015-07-16 ENCOUNTER — Telehealth: Payer: Self-pay | Admitting: Oncology

## 2015-07-16 ENCOUNTER — Ambulatory Visit (HOSPITAL_BASED_OUTPATIENT_CLINIC_OR_DEPARTMENT_OTHER): Payer: Medicare Other | Admitting: Oncology

## 2015-07-16 VITALS — BP 135/65 | HR 64 | Temp 97.5°F | Resp 18 | Ht 75.0 in | Wt 205.7 lb

## 2015-07-16 DIAGNOSIS — C155 Malignant neoplasm of lower third of esophagus: Secondary | ICD-10-CM

## 2015-07-16 DIAGNOSIS — M25461 Effusion, right knee: Secondary | ICD-10-CM

## 2015-07-16 NOTE — Telephone Encounter (Signed)
Gave and printed appt sched and avs for pt for July  °

## 2015-07-16 NOTE — Progress Notes (Signed)
  [facility] OFFICE PROGRESS NOTE   Diagnosis:   Esophagus cancer  INTERVAL HISTORY:    David [name] returns as scheduled. He feels well. Good appetite. He is active. He recently fell off cutting  a tree in his daughters yard. The right knee has been slightly swollen since the fall. He reports his weight has stabilized over the past month.  Objective:  Vital signs in last 24 hours:  Blood pressure 135/65, pulse 64, temperature 97.5 F (36.4 C), temperature source Oral, resp. rate 18, height 6\' 3"  (1.905 m), weight 205 lb 11.2 oz (93.305 kg), SpO2 100 %.    HEENT:  Neck without mass Lymphatics:  No cervical, supra-clavicular, or axillary nodes Resp:  Lungs clear bilaterally Cardio:  Regular rate and rhythm GI:  No hepatomegaly , no mass, nontender Vascular:  The right lower leg is slightly larger than left side surrounding the knee joint. The lower leg and ankle are not swollen. No erythema   Medications: I have reviewed the patient's current medications.  Assessment/Plan: 1. Adenocarcinoma of the distal esophagus,uT3uN  Presenting with a food impaction and dysphagia 08/20/2014  Staging CT scans of the chest, abdomen, and pelvis 10/06/2014 with indeterminate pulmonary nodules, a 9 mm paraesophageal lymph node, and circumferential thickening of the distal esophagus  PET scan 10/23/2014 confirmed hypermetabolic thickening at the distal esophagus with small paraesophageal nodes having faint nonspecific activity. A pleural-based focus of consolidation at the posterior right lower lobe had low metabolic activity-favored to be a benign process.  Initiation of concurrent weekly Taxol/carboplatin and radiation on 11/02/2014, radiation completed 12/09/2014  CTs 12/31/2014 revealed distal esophageal wall thickening with adjacent small lymph nodes suspicious for metastases, stable lung nodule posterior to the right bronchus intermedius and the persistent 5 mm nodule in the right  lower lobe  Esophagectomy 02/01/2015 with the pathology confirming a ypT3,ypN3 tumor, positive distal margin  2. Prostatic hypertrophy  3. Hypertension  4. History of multiple basal cell skin cancers  5. History of thrombocytopenia secondary to chemotherapy   Disposition:   David [name] remains in clinical remission from  Esophagus cancer. He will see Dr. Servando Snare in May. He will return for an office visit here in 4 months. I suspect the slight swelling at the right knee is related to the knee replacement surgery and recent fall. I have a low clinical suspicion for a DVT.    Betsy Coder, MD  07/16/2015  9:55 AM

## 2015-08-05 ENCOUNTER — Telehealth: Payer: Self-pay | Admitting: *Deleted

## 2015-08-05 NOTE — Telephone Encounter (Signed)
David [name] has called stating he feels as if he is beginning to have side effects from taking the metoprolol for so long, mainly being so lethargic.  He would like to decrease the dosage or stop it . I discussed this with Dr. Servando Snare. He said to decrease the dose by one half...1/2 tab twice daily....for 7-10 days and then d/c. I instructed David [name] on the new directions and he understood.

## 2015-08-26 ENCOUNTER — Encounter: Payer: Medicare Other | Admitting: Cardiothoracic Surgery

## 2015-09-01 ENCOUNTER — Other Ambulatory Visit: Payer: Self-pay | Admitting: Cardiothoracic Surgery

## 2015-09-01 DIAGNOSIS — C155 Malignant neoplasm of lower third of esophagus: Secondary | ICD-10-CM

## 2015-09-02 ENCOUNTER — Encounter: Payer: Self-pay | Admitting: Cardiothoracic Surgery

## 2015-09-02 ENCOUNTER — Ambulatory Visit (INDEPENDENT_AMBULATORY_CARE_PROVIDER_SITE_OTHER): Payer: Medicare Other | Admitting: Cardiothoracic Surgery

## 2015-09-02 ENCOUNTER — Ambulatory Visit
Admission: RE | Admit: 2015-09-02 | Discharge: 2015-09-02 | Disposition: A | Discharge status: Home / Self Care | Payer: Medicare Other | Source: Ambulatory Visit | Attending: Cardiothoracic Surgery | Admitting: Cardiothoracic Surgery

## 2015-09-02 VITALS — BP 146/76 | HR 73 | Resp 16 | Ht 75.0 in | Wt 201.2 lb

## 2015-09-02 DIAGNOSIS — C160 Malignant neoplasm of cardia: Secondary | ICD-10-CM

## 2015-09-02 DIAGNOSIS — Z09 Encounter for follow-up examination after completed treatment for conditions other than malignant neoplasm: Secondary | ICD-10-CM

## 2015-09-02 DIAGNOSIS — C159 Malignant neoplasm of esophagus, unspecified: Secondary | ICD-10-CM | POA: Diagnosis not present

## 2015-09-02 DIAGNOSIS — C155 Malignant neoplasm of lower third of esophagus: Secondary | ICD-10-CM

## 2015-09-02 NOTE — Progress Notes (Signed)
[address]       [place], [address]             [phone]      David [name] [place] Medical Record [id number] Date of Birth: 1940/07/31  Referring: Ladell Pier, MD Primary Care: Marton Redwood, MD  Chief Complaint:   POST OP FOLLOW UP  History of Present Illness:     Cancer of lower third of esophagus [facility])   Staging form: Esophagus - Squamous Cell Carcinoma, AJCC 7th Edition     Clinical: Stage IIIB (T3, N2, M0) - Signed by Ladell Pier, MD on 10/19/2014     Pathologic stage from 02/01/2015: Stage IIIC (T3, N3, cM0, G4 - Undifferentiated, Location: Middle) - Signed by Grace Isaac, MD on 02/04/2015  02/01/2015  OPERATIVE REPORT PREOPERATIVE DIAGNOSIS: 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 SURGICAL PROCEDURE: Transhiatal total esophagectomy with cervical esophagogastrostomy, pyloromyotomy and feeding jejunostomy tube. SURGEON: Lanelle Bal, MD  Patient  Now 7 months post op. Some intermittent dumping  No troubling swallowing.     Wt Readings from Last 3 Encounters:  09/02/15 201 lb 3.2 oz (91.264 kg)  07/16/15 205 lb 11.2 oz (93.305 kg)  05/27/15 210 lb 8 oz (95.482 kg)     Past Medical History  Diagnosis Date  . Hypertension   . Anxiety   . Arthritis   . Basal cell carcinoma     nose, face, back  . Food impaction of esophagus 08/20/2014  . Esophageal cancer ([place])   . Enlarged prostate   . Allergy   . Radiation   . [name]'s esophagus   . Dysrhythmia   . Kidney stone   . Urinary frequency   . GERD (gastroesophageal reflux disease)   . Wears glasses      History  Smoking status  . Former Smoker  . Quit date: 06/07/1961  Smokeless tobacco  . Never Used    History  Alcohol Use No     Allergies  Allergen  Reactions  . Dilaudid [Hydromorphone Hcl] Hives and Itching  . Fentanyl Itching  . Nexium [Esomeprazole Magnesium] Other (See Comments)    Difficulty urinating and passing stool; dry mouth  . Other     Thinks it was oxycodone; made him hallucinate  . Oxycodone Nausea And Vomiting  . Statins Other (See Comments)    Leg cramping    Current Outpatient Prescriptions  Medication Sig Dispense Refill  . ALPRAZolam (XANAX) 0.5 MG tablet Take 0.5 mg by mouth at bedtime as needed for sleep.   4  . amLODipine (NORVASC) 5 MG tablet Take 5 mg by mouth daily.  0  . fosinopril (MONOPRIL) 20 MG tablet Take 1 tablet (20 mg total) by mouth daily. 30 tablet 3  . pantoprazole (PROTONIX) 20 MG tablet Take 40 mg by mouth daily.    . sodium bicarbonate 325 MG tablet Take 325 mg by mouth 3 (three) times daily. 2 pills TID    . Tamsulosin HCl (FLOMAX) 0.4 MG CAPS Take 0.4 mg by mouth daily after supper.    Marland Kitchen aspirin 81 MG tablet Take 81 mg by mouth daily. Reported on 09/02/2015     No current facility-administered medications for this visit.       Physical Exam: BP 146/76 mmHg  Pulse 73  Resp 16  Ht 6\' 3"  (  1.905 m)  Wt 201 lb 3.2 oz (91.264 kg)  BMI 25.15 kg/m2  SpO2 97%  General appearance: alert, cooperative and no distress Neurologic: intact Heart: regular rate and rhythm, S1, S2 normal, no murmur, click, rub or gallop Lungs: clear to auscultation bilaterally Abdomen: soft, non-tender; bowel sounds normal; no masses,  no organomegaly Extremities: extremities normal, atraumatic, no cyanosis or edema and Homans sign is negative, no sign of DVT Wound: Abdominal and left neck incision well-healed without signs of infection, jejunostomy feeding tube is in place.  he has no evidence of incisional hernias No adenopathy   Diagnostic Studies & Laboratory data:     Recent Radiology Findings:   Dg Chest 2 View  09/02/2015  CLINICAL DATA:  Esophageal cancer EXAM: CHEST  2 VIEW COMPARISON:  02/23/2015  FINDINGS: Stable blunting of the left costophrenic angle. Right pleural effusion resolved. Lungs are clear. Overlapping vascular shadows project over the right lung base. No pneumothorax. Normal heart size. Normal vascularity. IMPRESSION: Chronic pleural changes at the left base. No acute cardiopulmonary disease. Electronically Signed   By: Marybelle Killings M.D.   On: 09/02/2015 08:51      Recent Lab Findings: Lab Results  Component Value Date   WBC 7.7 02/11/2015   HGB 12.1* 02/11/2015   HCT 36.9* 02/11/2015   PLT 169 02/11/2015   GLUCOSE 99 02/11/2015   ALT 29 01/28/2015   AST 31 01/28/2015   NA 140 02/11/2015   K 3.7 02/11/2015   CL 104 02/11/2015   CREATININE 0.77 02/11/2015   BUN 11 02/11/2015   CO2 29 02/11/2015   INR 1.06 01/28/2015   Wt Readings from Last 3 Encounters:  09/02/15 201 lb 3.2 oz (91.264 kg)  07/16/15 205 lb 11.2 oz (93.305 kg)  05/27/15 210 lb 8 oz (95.482 kg)   Patient notes past 6-8 weeks weight has been stable  Assessment / Plan:      Patient is approximately 7 months postop following transhiatal total esophagectomy, with pathologic stage IIIC disease with 8 of 9 positive lymph nodes, and microscopic positive gastric margin not present on frozen section but noted on permanent section. Watch BP may need to restart bp MEDS WILL FOLLOW UP WITH dR sHAW pLAN TO SEE IN 4 MONTHS    Grace Isaac MD      [address] [place] [address] Office [phone]   Beeper [phone]  09/02/2015 9:52 AM

## 2015-11-03 DIAGNOSIS — N400 Enlarged prostate without lower urinary tract symptoms: Secondary | ICD-10-CM | POA: Diagnosis not present

## 2015-11-03 DIAGNOSIS — N401 Enlarged prostate with lower urinary tract symptoms: Secondary | ICD-10-CM | POA: Diagnosis not present

## 2015-11-03 DIAGNOSIS — N200 Calculus of kidney: Secondary | ICD-10-CM | POA: Diagnosis not present

## 2015-11-09 ENCOUNTER — Ambulatory Visit (HOSPITAL_BASED_OUTPATIENT_CLINIC_OR_DEPARTMENT_OTHER): Payer: Medicare Other | Admitting: Oncology

## 2015-11-09 ENCOUNTER — Telehealth: Payer: Self-pay | Admitting: Oncology

## 2015-11-09 VITALS — BP 132/63 | HR 83 | Temp 98.2°F | Resp 18 | Ht 75.0 in | Wt 197.2 lb

## 2015-11-09 DIAGNOSIS — C155 Malignant neoplasm of lower third of esophagus: Secondary | ICD-10-CM | POA: Diagnosis not present

## 2015-11-09 DIAGNOSIS — I10 Essential (primary) hypertension: Secondary | ICD-10-CM | POA: Diagnosis not present

## 2015-11-09 NOTE — Telephone Encounter (Signed)
Gave pt cal & avs °

## 2015-11-09 NOTE — Progress Notes (Signed)
Family surgeon David [name] from the critical review reveals David [name] Also David [name] Was Times Okay 1 [place] OFFICE PROGRESS NOTE   Diagnosis:  Esophagus cancer  INTERVAL HISTORY:   David [name] returns as scheduled. He feels well. Good appetite and energy level. He is eating a regular diet. He is working. He reports being diagnosed with a "kidney stone". He is followed by David [name]. He also has nocturia and urinary hesitancy. He takes Flomax and was placed on post car yesterday.  Objective:  Vital signs in last 24 hours:  Blood pressure 132/63, pulse 83, temperature 98.2 F (36.8 C), temperature source Oral, resp. rate 18, height 6\' 3"  (1.905 m), weight 197 lb 3.2 oz (89.449 kg), SpO2 97 %.    HEENT: Neck without mass Lymphatics: No cervical, supraclavicular, or axillary nodes Resp: Lungs clear bilaterally Cardio: Regular rate and rhythm GI: No hepatomegaly, no mass, nontender Vascular: Trace pitting edema at the low leg and ankle bilaterally  Medications: I have reviewed the patient's current medications.  Assessment/Plan: 1. Adenocarcinoma of the distal esophagus,[id number]  Presenting with a food impaction and dysphagia 08/20/2014  Staging CT scans of the chest, abdomen, and pelvis 10/06/2014 with indeterminate pulmonary nodules, a 9 mm paraesophageal lymph node, and circumferential thickening of the distal esophagus  PET scan 10/23/2014 confirmed hypermetabolic thickening at the distal esophagus with small paraesophageal nodes having faint nonspecific activity. A pleural-based focus of consolidation at the posterior right lower lobe had low metabolic activity-favored to be a benign process.  Initiation of concurrent weekly Taxol/carboplatin and radiation on 11/02/2014, radiation completed 12/09/2014  CTs 12/31/2014 revealed distal esophageal wall thickening with adjacent small lymph nodes suspicious for metastases, stable lung nodule posterior to  the right bronchus intermedius and the persistent 5 mm nodule in the right lower lobe  Esophagectomy 02/01/2015 with the pathology confirming a ypT3,ypN3 tumor, positive distal margin  2. Prostatic hypertrophy  3. Hypertension  4. History of multiple basal cell skin cancers  5. History of thrombocytopenia secondary to chemotherapy    Disposition: David [name] remains in clinical remission from esophagus cancer. He will return for an office visit in 4 months. He will see David [name] in the interim. He will continue follow-up with David [name] for treatment of prostatic hypertrophy.   David Coder, MD  11/09/2015  10:19 AM

## 2016-01-06 ENCOUNTER — Ambulatory Visit (INDEPENDENT_AMBULATORY_CARE_PROVIDER_SITE_OTHER): Payer: Medicare Other | Admitting: Cardiothoracic Surgery

## 2016-01-06 ENCOUNTER — Encounter: Payer: Self-pay | Admitting: Cardiothoracic Surgery

## 2016-01-06 VITALS — BP 134/77 | HR 83 | Resp 20 | Ht 75.0 in | Wt 197.8 lb

## 2016-01-06 DIAGNOSIS — C160 Malignant neoplasm of cardia: Secondary | ICD-10-CM

## 2016-01-06 DIAGNOSIS — Z09 Encounter for follow-up examination after completed treatment for conditions other than malignant neoplasm: Secondary | ICD-10-CM

## 2016-01-06 NOTE — Progress Notes (Signed)
David [name]       David [name],David [name] [id number]             [phone]      [place] A [facility] Medical Record [id number] Date of Birth: 03/30/1941  Referring: David Pier, MD Primary Care: David Redwood, MD  Chief Complaint:   POST OP FOLLOW UP  History of Present Illness:     Cancer of lower third of esophagus David [name])   Staging form: Esophagus - Squamous Cell Carcinoma, AJCC 7th Edition     Clinical: Stage IIIB (T3, N2, M0) - Signed by David Pier, MD on 10/19/2014     Pathologic stage from 02/01/2015: Stage IIIC (T3, N3, cM0, G4 - Undifferentiated, Location: Middle) - Signed by David Isaac, MD on 02/04/2015  02/01/2015  OPERATIVE REPORT PREOPERATIVE DIAGNOSIS: 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 SURGICAL PROCEDURE: Transhiatal total esophagectomy with cervical esophagogastrostomy, pyloromyotomy and feeding jejunostomy tube. SURGEON: David Bal, MD  Patient  Now 11 months post op. Some intermittent dumping,   Some trouble swallowing swome  meats. Weight stable 197-201. Main complaint is trouble passing urin, discussed treatment with urology today.     Wt Readings from Last 3 Encounters:  01/06/16 197 lb 12.8 oz (89.7 kg)  11/09/15 197 lb 3.2 oz (89.4 kg)  09/02/15 201 lb 3.2 oz (91.3 kg)     Past Medical History:  Diagnosis Date  . Allergy   . Anxiety   . Arthritis   . [name]'s esophagus   . Basal cell carcinoma    nose, face, back  . Dysrhythmia   . Enlarged prostate   . Esophageal cancer ([place])   . Food impaction of esophagus 08/20/2014  . GERD (gastroesophageal reflux disease)   . Hypertension   . Kidney stone   . Radiation   . Urinary frequency   . Wears glasses      History  Smoking Status  . Former Smoker  .  Quit date: 06/07/1961  Smokeless Tobacco  . Never Used    History  Alcohol Use No     Allergies  Allergen Reactions  . Dilaudid [Hydromorphone Hcl] Hives and Itching  . Fentanyl Itching  . Nexium [Esomeprazole Magnesium] Other (See Comments)    Difficulty urinating and passing stool; dry mouth  . Other     Thinks it was oxycodone; made him hallucinate  . Oxycodone Nausea And Vomiting  . Statins Other (See Comments)    Leg cramping    Current Outpatient Prescriptions  Medication Sig Dispense Refill  . ALPRAZolam (XANAX) 0.5 MG tablet Take 0.5 mg by mouth at bedtime as needed for sleep.   4  . amLODipine (NORVASC) 5 MG tablet Take 5 mg by mouth daily.  0  . aspirin 81 MG tablet Take 81 mg by mouth daily. Reported on 09/02/2015    . fosinopril (MONOPRIL) 20 MG tablet Take 1 tablet (20 mg total) by mouth daily. (Patient not taking: Reported on 01/06/2016) 30 tablet 3  . pantoprazole (PROTONIX) 20 MG tablet Take 40 mg by mouth daily.    . sodium bicarbonate 325 MG tablet Take 325 mg by mouth 3 (three) times daily. 2 pills TID    . Tamsulosin HCl (FLOMAX) 0.4 MG CAPS Take 0.4 mg by mouth daily after supper.     No current facility-administered medications for  this visit.        Physical Exam: BP 134/77 (BP Location: Right Arm, Patient Position: Sitting, Cuff Size: Normal)   Pulse 83   Resp 20   Ht 6\' 3"  (1.905 m)   Wt 197 lb 12.8 oz (89.7 kg)   SpO2 96% Comment: RA  BMI 24.72 kg/m   General appearance: alert, cooperative and no distress Neurologic: intact Heart: regular rate and rhythm, S1, S2 normal, no murmur, click, rub or gallop Lungs: clear to auscultation bilaterally Abdomen: soft, non-tender; bowel sounds normal; no masses,  no organomegaly Extremities: extremities normal, atraumatic, no cyanosis or edema and Homans sign is negative, no sign of DVT Wound: Abdominal and left neck incision well-healed without signs of infection, jejunostomy feeding tube is in  place.  he has no evidence of incisional hernias No adenopathy   Diagnostic Studies & Laboratory data:     Recent Radiology Findings:   No results found.    Recent Lab Findings: Lab Results  Component Value Date   WBC 7.7 02/11/2015   HGB 12.1 (L) 02/11/2015   HCT 36.9 (L) 02/11/2015   PLT 169 02/11/2015   GLUCOSE 99 02/11/2015   ALT 29 01/28/2015   AST 31 01/28/2015   NA 140 02/11/2015   K 3.7 02/11/2015   CL 104 02/11/2015   CREATININE 0.77 02/11/2015   BUN 11 02/11/2015   CO2 29 02/11/2015   INR 1.06 01/28/2015     Assessment / Plan:      Patient is approximately 11 months postop following transhiatal total esophagectomy, with pathologic stage IIIC disease with 8 of 9 positive lymph nodes, and microscopic positive gastric margin not present on frozen section but noted on permanent section. Has been off BP meds but notes BP has increased , will discuss with DR Solon to see in 4 months     David Isaac MD      David [name],David [name] [id number] Office [phone]   Beeper [phone]  01/06/2016 7:00 PM

## 2016-01-07 DIAGNOSIS — N200 Calculus of kidney: Secondary | ICD-10-CM | POA: Diagnosis not present

## 2016-01-07 DIAGNOSIS — N132 Hydronephrosis with renal and ureteral calculous obstruction: Secondary | ICD-10-CM | POA: Diagnosis not present

## 2016-01-07 DIAGNOSIS — N201 Calculus of ureter: Secondary | ICD-10-CM | POA: Diagnosis not present

## 2016-01-10 ENCOUNTER — Other Ambulatory Visit: Payer: Self-pay | Admitting: Urology

## 2016-01-11 ENCOUNTER — Encounter (HOSPITAL_COMMUNITY): Payer: Self-pay | Admitting: *Deleted

## 2016-01-11 NOTE — Progress Notes (Signed)
Patient is very concerned about lying flat for ESWL treatment as he has horrible issues with reflux. [place] and spoke with [place]. Patient will be able to bring his special pillow which would elevate his head to 30 degrees to prevent reflux.

## 2016-01-13 ENCOUNTER — Ambulatory Visit (HOSPITAL_COMMUNITY): Admission: RE | Admit: 2016-01-13 | Payer: Medicare Other | Source: Ambulatory Visit | Admitting: Urology

## 2016-01-13 ENCOUNTER — Other Ambulatory Visit: Payer: Self-pay | Admitting: Urology

## 2016-01-13 SURGERY — LITHOTRIPSY, ESWL
Anesthesia: LOCAL | Laterality: Right

## 2016-01-21 ENCOUNTER — Encounter (HOSPITAL_BASED_OUTPATIENT_CLINIC_OR_DEPARTMENT_OTHER): Payer: Self-pay | Admitting: *Deleted

## 2016-01-21 NOTE — Progress Notes (Signed)
To [facility] at 1130-Hgb on arrival-Instructed Npo after Mn-solids,clear liquids only till 0300,then Npo-will take protonix in am-Due to total esophagectomy unable to lay flat,needs 30 degree elevation.Initial history reviewed with Dr Veatrice Kells, will update since history completed with patient.

## 2016-01-26 ENCOUNTER — Encounter (HOSPITAL_COMMUNITY)
Admission: RE | Disposition: A | Discharge status: Home / Self Care | Payer: Self-pay | Source: Ambulatory Visit | Attending: Urology

## 2016-01-26 ENCOUNTER — Ambulatory Visit (HOSPITAL_BASED_OUTPATIENT_CLINIC_OR_DEPARTMENT_OTHER)
Admission: RE | Admit: 2016-01-26 | Discharge: 2016-01-27 | Disposition: A | Discharge status: Home / Self Care | Payer: Medicare Other | Source: Ambulatory Visit | Attending: Urology | Admitting: Urology

## 2016-01-26 ENCOUNTER — Ambulatory Visit (HOSPITAL_BASED_OUTPATIENT_CLINIC_OR_DEPARTMENT_OTHER): Payer: Medicare Other | Admitting: Anesthesiology

## 2016-01-26 ENCOUNTER — Other Ambulatory Visit: Payer: Self-pay

## 2016-01-26 ENCOUNTER — Encounter (HOSPITAL_BASED_OUTPATIENT_CLINIC_OR_DEPARTMENT_OTHER): Payer: Self-pay | Admitting: *Deleted

## 2016-01-26 DIAGNOSIS — I493 Ventricular premature depolarization: Secondary | ICD-10-CM | POA: Diagnosis not present

## 2016-01-26 DIAGNOSIS — Z888 Allergy status to other drugs, medicaments and biological substances status: Secondary | ICD-10-CM | POA: Diagnosis not present

## 2016-01-26 DIAGNOSIS — Z807 Family history of other malignant neoplasms of lymphoid, hematopoietic and related tissues: Secondary | ICD-10-CM | POA: Insufficient documentation

## 2016-01-26 DIAGNOSIS — I10 Essential (primary) hypertension: Secondary | ICD-10-CM | POA: Insufficient documentation

## 2016-01-26 DIAGNOSIS — Z9889 Other specified postprocedural states: Secondary | ICD-10-CM | POA: Insufficient documentation

## 2016-01-26 DIAGNOSIS — Z8501 Personal history of malignant neoplasm of esophagus: Secondary | ICD-10-CM | POA: Diagnosis not present

## 2016-01-26 DIAGNOSIS — Z87442 Personal history of urinary calculi: Secondary | ICD-10-CM | POA: Diagnosis not present

## 2016-01-26 DIAGNOSIS — N138 Other obstructive and reflux uropathy: Secondary | ICD-10-CM | POA: Insufficient documentation

## 2016-01-26 DIAGNOSIS — K2270 Barrett's esophagus without dysplasia: Secondary | ICD-10-CM | POA: Diagnosis not present

## 2016-01-26 DIAGNOSIS — Z96652 Presence of left artificial knee joint: Secondary | ICD-10-CM | POA: Insufficient documentation

## 2016-01-26 DIAGNOSIS — N401 Enlarged prostate with lower urinary tract symptoms: Secondary | ICD-10-CM | POA: Diagnosis not present

## 2016-01-26 DIAGNOSIS — N201 Calculus of ureter: Secondary | ICD-10-CM | POA: Diagnosis not present

## 2016-01-26 DIAGNOSIS — Z85828 Personal history of other malignant neoplasm of skin: Secondary | ICD-10-CM | POA: Insufficient documentation

## 2016-01-26 DIAGNOSIS — Z885 Allergy status to narcotic agent status: Secondary | ICD-10-CM | POA: Insufficient documentation

## 2016-01-26 DIAGNOSIS — Z9049 Acquired absence of other specified parts of digestive tract: Secondary | ICD-10-CM | POA: Insufficient documentation

## 2016-01-26 DIAGNOSIS — M1990 Unspecified osteoarthritis, unspecified site: Secondary | ICD-10-CM | POA: Insufficient documentation

## 2016-01-26 DIAGNOSIS — Z79899 Other long term (current) drug therapy: Secondary | ICD-10-CM | POA: Insufficient documentation

## 2016-01-26 DIAGNOSIS — F419 Anxiety disorder, unspecified: Secondary | ICD-10-CM | POA: Diagnosis not present

## 2016-01-26 DIAGNOSIS — N132 Hydronephrosis with renal and ureteral calculous obstruction: Secondary | ICD-10-CM | POA: Insufficient documentation

## 2016-01-26 DIAGNOSIS — Z87891 Personal history of nicotine dependence: Secondary | ICD-10-CM | POA: Diagnosis not present

## 2016-01-26 DIAGNOSIS — K219 Gastro-esophageal reflux disease without esophagitis: Secondary | ICD-10-CM | POA: Insufficient documentation

## 2016-01-26 DIAGNOSIS — N400 Enlarged prostate without lower urinary tract symptoms: Secondary | ICD-10-CM | POA: Diagnosis not present

## 2016-01-26 DIAGNOSIS — N320 Bladder-neck obstruction: Secondary | ICD-10-CM | POA: Diagnosis not present

## 2016-01-26 HISTORY — PX: TRANSURETHRAL RESECTION OF PROSTATE: SHX73

## 2016-01-26 HISTORY — PX: CYSTOSCOPY WITH STENT PLACEMENT: SHX5790

## 2016-01-26 HISTORY — PX: CYSTOSCOPY/RETROGRADE/URETEROSCOPY/STONE EXTRACTION WITH BASKET: SHX5317

## 2016-01-26 HISTORY — PX: HOLMIUM LASER APPLICATION: SHX5852

## 2016-01-26 LAB — POCT HEMOGLOBIN-HEMACUE: Hemoglobin: 14.3 g/dL (ref 13.0–17.0)

## 2016-01-26 SURGERY — TURP (TRANSURETHRAL RESECTION OF PROSTATE)
Anesthesia: General | Site: Ureter | Laterality: Right

## 2016-01-26 MED ORDER — ARTIFICIAL TEARS OP OINT
TOPICAL_OINTMENT | OPHTHALMIC | Status: AC
  Filled 2016-01-26: qty 3.5

## 2016-01-26 MED ORDER — LIDOCAINE 2% (20 MG/ML) 5 ML SYRINGE
INTRAMUSCULAR | Status: DC | PRN
  Administered 2016-01-26: 100 mg via INTRAVENOUS

## 2016-01-26 MED ORDER — DEXAMETHASONE SODIUM PHOSPHATE 10 MG/ML IJ SOLN
INTRAMUSCULAR | Status: AC
  Filled 2016-01-26: qty 1

## 2016-01-26 MED ORDER — EPHEDRINE SULFATE-NACL 50-0.9 MG/10ML-% IV SOSY
PREFILLED_SYRINGE | INTRAVENOUS | Status: DC | PRN
  Administered 2016-01-26 (×2): 10 mg via INTRAVENOUS

## 2016-01-26 MED ORDER — PANTOPRAZOLE SODIUM 40 MG PO TBEC
40.0000 mg | DELAYED_RELEASE_TABLET | Freq: Every day | ORAL | Status: DC
  Administered 2016-01-27: 40 mg via ORAL
  Filled 2016-01-26: qty 1

## 2016-01-26 MED ORDER — ONDANSETRON HCL 4 MG/2ML IJ SOLN
4.0000 mg | INTRAMUSCULAR | Status: DC | PRN
  Administered 2016-01-26: 4 mg via INTRAVENOUS
  Filled 2016-01-26: qty 2

## 2016-01-26 MED ORDER — SUCCINYLCHOLINE CHLORIDE 20 MG/ML IJ SOLN
INTRAMUSCULAR | Status: AC
  Filled 2016-01-26: qty 1

## 2016-01-26 MED ORDER — SODIUM CHLORIDE 0.9 % IR SOLN
3000.0000 mL | Status: DC
  Administered 2016-01-26: 3000 mL
  Filled 2016-01-26: qty 3000

## 2016-01-26 MED ORDER — ROCURONIUM BROMIDE 100 MG/10ML IV SOLN: INTRAVENOUS | Status: DC | PRN

## 2016-01-26 MED ORDER — EPHEDRINE 5 MG/ML INJ
INTRAVENOUS | Status: AC
  Filled 2016-01-26: qty 10

## 2016-01-26 MED ORDER — SODIUM CHLORIDE 0.9 % IR SOLN
Status: DC | PRN
  Administered 2016-01-26: 3000 mL
  Administered 2016-01-26: 4000 mL
  Administered 2016-01-26: 3000 mL
  Administered 2016-01-26: 6000 mL via INTRAVESICAL
  Administered 2016-01-26: 3000 mL

## 2016-01-26 MED ORDER — FENTANYL CITRATE (PF) 100 MCG/2ML IJ SOLN
INTRAMUSCULAR | Status: DC | PRN
  Administered 2016-01-26: 50 ug via INTRAVENOUS
  Administered 2016-01-26: 25 ug via INTRAVENOUS
  Administered 2016-01-26: 50 ug via INTRAVENOUS

## 2016-01-26 MED ORDER — IOHEXOL 300 MG/ML  SOLN
INTRAMUSCULAR | Status: DC | PRN
  Administered 2016-01-26: 7 mL

## 2016-01-26 MED ORDER — PHENYLEPHRINE 40 MCG/ML (10ML) SYRINGE FOR IV PUSH (FOR BLOOD PRESSURE SUPPORT)
PREFILLED_SYRINGE | INTRAVENOUS | Status: AC
  Filled 2016-01-26: qty 10

## 2016-01-26 MED ORDER — METOCLOPRAMIDE HCL 5 MG/ML IJ SOLN
INTRAMUSCULAR | Status: DC | PRN
  Administered 2016-01-26: 10 mg via INTRAVENOUS

## 2016-01-26 MED ORDER — PHENYLEPHRINE 40 MCG/ML (10ML) SYRINGE FOR IV PUSH (FOR BLOOD PRESSURE SUPPORT)
PREFILLED_SYRINGE | INTRAVENOUS | Status: DC | PRN
  Administered 2016-01-26 (×3): 80 ug via INTRAVENOUS

## 2016-01-26 MED ORDER — ROCURONIUM BROMIDE 10 MG/ML (PF) SYRINGE
PREFILLED_SYRINGE | INTRAVENOUS | Status: DC | PRN
  Administered 2016-01-26: 30 mg via INTRAVENOUS

## 2016-01-26 MED ORDER — ONDANSETRON HCL 4 MG/2ML IJ SOLN
INTRAMUSCULAR | Status: AC
  Filled 2016-01-26: qty 2

## 2016-01-26 MED ORDER — ONDANSETRON HCL 4 MG/2ML IJ SOLN
INTRAMUSCULAR | Status: DC | PRN
  Administered 2016-01-26: 4 mg via INTRAVENOUS

## 2016-01-26 MED ORDER — ROCURONIUM BROMIDE 10 MG/ML (PF) SYRINGE
PREFILLED_SYRINGE | INTRAVENOUS | Status: AC
  Filled 2016-01-26: qty 10

## 2016-01-26 MED ORDER — CEFAZOLIN SODIUM-DEXTROSE 2-4 GM/100ML-% IV SOLN
2.0000 g | INTRAVENOUS | Status: AC
  Administered 2016-01-26: 2 g via INTRAVENOUS
  Filled 2016-01-26: qty 100

## 2016-01-26 MED ORDER — CEPHALEXIN 500 MG PO CAPS
500.0000 mg | ORAL_CAPSULE | Freq: Two times a day (BID) | ORAL | Status: DC
  Administered 2016-01-26 – 2016-01-27 (×2): 500 mg via ORAL
  Filled 2016-01-26 (×2): qty 1

## 2016-01-26 MED ORDER — ENSURE ENLIVE PO LIQD
237.0000 mL | Freq: Two times a day (BID) | ORAL | Status: DC
  Administered 2016-01-27: 237 mL via ORAL

## 2016-01-26 MED ORDER — SENNA 8.6 MG PO TABS
1.0000 | ORAL_TABLET | Freq: Two times a day (BID) | ORAL | Status: DC
  Administered 2016-01-26 – 2016-01-27 (×2): 8.6 mg via ORAL
  Filled 2016-01-26 (×2): qty 1

## 2016-01-26 MED ORDER — SODIUM CHLORIDE 0.45 % IV SOLN
INTRAVENOUS | Status: DC
  Administered 2016-01-26 – 2016-01-27 (×2): via INTRAVENOUS

## 2016-01-26 MED ORDER — METOCLOPRAMIDE HCL 5 MG/ML IJ SOLN
10.0000 mg | Freq: Once | INTRAMUSCULAR | Status: DC | PRN
  Filled 2016-01-26: qty 2

## 2016-01-26 MED ORDER — CEFAZOLIN SODIUM-DEXTROSE 2-4 GM/100ML-% IV SOLN
INTRAVENOUS | Status: AC
  Filled 2016-01-26: qty 100

## 2016-01-26 MED ORDER — STERILE WATER FOR IRRIGATION IR SOLN
Status: DC | PRN
  Administered 2016-01-26: 500 mL

## 2016-01-26 MED ORDER — MIDAZOLAM HCL 5 MG/5ML IJ SOLN
INTRAMUSCULAR | Status: DC | PRN
  Administered 2016-01-26: 1 mg via INTRAVENOUS

## 2016-01-26 MED ORDER — STERILE WATER FOR IRRIGATION IR SOLN
Status: DC | PRN
  Administered 2016-01-26: 6000 mL via INTRAVESICAL

## 2016-01-26 MED ORDER — LIDOCAINE HCL (CARDIAC) 20 MG/ML IV SOLN: INTRAVENOUS | Status: DC | PRN

## 2016-01-26 MED ORDER — FENTANYL CITRATE (PF) 100 MCG/2ML IJ SOLN
INTRAMUSCULAR | Status: AC
  Filled 2016-01-26: qty 2

## 2016-01-26 MED ORDER — DEXAMETHASONE SODIUM PHOSPHATE 4 MG/ML IJ SOLN
INTRAMUSCULAR | Status: DC | PRN
  Administered 2016-01-26: 10 mg via INTRAVENOUS

## 2016-01-26 MED ORDER — MIDAZOLAM HCL 2 MG/2ML IJ SOLN
INTRAMUSCULAR | Status: AC
  Filled 2016-01-26: qty 2

## 2016-01-26 MED ORDER — LIDOCAINE 2% (20 MG/ML) 5 ML SYRINGE
INTRAMUSCULAR | Status: AC
  Filled 2016-01-26: qty 10

## 2016-01-26 MED ORDER — LACTATED RINGERS IV SOLN
INTRAVENOUS | Status: DC
  Administered 2016-01-26 (×2): via INTRAVENOUS
  Filled 2016-01-26: qty 1000

## 2016-01-26 MED ORDER — ONDANSETRON HCL 4 MG/2ML IJ SOLN
4.0000 mg | Freq: Once | INTRAMUSCULAR | Status: DC | PRN
Start: 2016-01-26 — End: 2016-01-26
  Filled 2016-01-26: qty 2

## 2016-01-26 MED ORDER — PROPOFOL 10 MG/ML IV BOLUS
INTRAVENOUS | Status: DC | PRN
  Administered 2016-01-26: 200 mg via INTRAVENOUS
  Administered 2016-01-26: 20 mg via INTRAVENOUS

## 2016-01-26 MED ORDER — SUCCINYLCHOLINE CHLORIDE 20 MG/ML IJ SOLN: INTRAMUSCULAR | Status: DC | PRN

## 2016-01-26 MED ORDER — SUGAMMADEX SODIUM 200 MG/2ML IV SOLN
INTRAVENOUS | Status: AC
  Filled 2016-01-26: qty 2

## 2016-01-26 MED ORDER — HYDROCODONE-ACETAMINOPHEN 5-325 MG PO TABS
1.0000 | ORAL_TABLET | ORAL | Status: DC | PRN
  Administered 2016-01-26 – 2016-01-27 (×2): 2 via ORAL
  Filled 2016-01-26 (×2): qty 2

## 2016-01-26 MED ORDER — SUGAMMADEX SODIUM 200 MG/2ML IV SOLN
INTRAVENOUS | Status: DC | PRN
  Administered 2016-01-26: 200 mg via INTRAVENOUS

## 2016-01-26 MED ORDER — ALPRAZOLAM 0.5 MG PO TABS
0.5000 mg | ORAL_TABLET | Freq: Every evening | ORAL | Status: DC | PRN
  Administered 2016-01-26: 0.5 mg via ORAL
  Filled 2016-01-26: qty 1

## 2016-01-26 MED ORDER — CEFAZOLIN IN D5W 1 GM/50ML IV SOLN
1.0000 g | INTRAVENOUS | Status: DC
  Filled 2016-01-26: qty 50

## 2016-01-26 MED ORDER — CEPHALEXIN 500 MG PO CAPS: 500.0000 mg | ORAL_CAPSULE | Freq: Two times a day (BID) | ORAL | 0 refills | Status: DC

## 2016-01-26 MED ORDER — OXYBUTYNIN CHLORIDE 5 MG PO TABS
5.0000 mg | ORAL_TABLET | Freq: Three times a day (TID) | ORAL | Status: DC | PRN
  Administered 2016-01-26: 5 mg via ORAL
  Filled 2016-01-26: qty 1

## 2016-01-26 MED ORDER — FENTANYL CITRATE (PF) 100 MCG/2ML IJ SOLN
25.0000 ug | INTRAMUSCULAR | Status: DC | PRN
  Administered 2016-01-26 (×2): 50 ug via INTRAVENOUS
  Filled 2016-01-26: qty 1

## 2016-01-26 MED ORDER — SUCCINYLCHOLINE CHLORIDE 200 MG/10ML IV SOSY
PREFILLED_SYRINGE | INTRAVENOUS | Status: DC | PRN
  Administered 2016-01-26: 120 mg via INTRAVENOUS

## 2016-01-26 MED ORDER — EPHEDRINE SULFATE 50 MG/ML IJ SOLN: INTRAMUSCULAR | Status: DC | PRN

## 2016-01-26 SURGICAL SUPPLY — 50 items
BAG DRAIN URO-CYSTO SKYTR STRL (DRAIN) ×4 IMPLANT
BAG DRN ANRFLXCHMBR STRAP LEK (BAG)
BAG DRN UROCATH (DRAIN) ×2
BAG URINE DRAINAGE (UROLOGICAL SUPPLIES) ×2 IMPLANT
BAG URINE LEG 19OZ MD ST LTX (BAG) IMPLANT
BASKET STONE 1.7 NGAGE (UROLOGICAL SUPPLIES) ×2 IMPLANT
CATH FOLEY 2WAY SLVR  5CC 20FR (CATHETERS)
CATH FOLEY 2WAY SLVR  5CC 22FR (CATHETERS)
CATH FOLEY 2WAY SLVR 30CC 22FR (CATHETERS) ×2 IMPLANT
CATH FOLEY 2WAY SLVR 5CC 20FR (CATHETERS) IMPLANT
CATH FOLEY 2WAY SLVR 5CC 22FR (CATHETERS) IMPLANT
CATH FOLEY 3WAY 30CC 22F (CATHETERS) IMPLANT
CATH HEMA 3WAY 30CC 24FR COUDE (CATHETERS) IMPLANT
CATH HEMA 3WAY 30CC 24FR RND (CATHETERS) IMPLANT
CATH INTERMIT  6FR 70CM (CATHETERS) IMPLANT
CLOTH BEACON ORANGE TIMEOUT ST (SAFETY) ×4 IMPLANT
ELECT BUTTON BIOP 24F 90D PLAS (MISCELLANEOUS) IMPLANT
ELECT REM PT RETURN 9FT ADLT (ELECTROSURGICAL) ×4
ELECTRODE REM PT RTRN 9FT ADLT (ELECTROSURGICAL) ×2 IMPLANT
EVACUATOR MICROVAS BLADDER (UROLOGICAL SUPPLIES) ×4 IMPLANT
FIBER LASER FLEXIVA 365 (UROLOGICAL SUPPLIES) ×2 IMPLANT
GLOVE BIO SURGEON STRL SZ 6.5 (GLOVE) ×2 IMPLANT
GLOVE BIO SURGEON STRL SZ8 (GLOVE) ×4 IMPLANT
GLOVE BIO SURGEONS STRL SZ 6.5 (GLOVE) ×2
GLOVE ECLIPSE 7.0 STRL STRAW (GLOVE) ×2 IMPLANT
GLOVE INDICATOR 6.5 STRL GRN (GLOVE) ×4 IMPLANT
GLOVE INDICATOR 7.0 STRL GRN (GLOVE) ×2 IMPLANT
GOWN STRL REUS W/ TWL LRG LVL3 (GOWN DISPOSABLE) ×2 IMPLANT
GOWN STRL REUS W/ TWL XL LVL3 (GOWN DISPOSABLE) ×2 IMPLANT
GOWN STRL REUS W/TWL LRG LVL3 (GOWN DISPOSABLE) ×6 IMPLANT
GOWN STRL REUS W/TWL XL LVL3 (GOWN DISPOSABLE) ×8 IMPLANT
GUIDEWIRE ANG ZIPWIRE 038X150 (WIRE) ×2 IMPLANT
GUIDEWIRE STR DUAL SENSOR (WIRE) ×2 IMPLANT
HOLDER FOLEY CATH W/STRAP (MISCELLANEOUS) ×2 IMPLANT
IV NS 1000ML (IV SOLUTION) ×4
IV NS 1000ML BAXH (IV SOLUTION) IMPLANT
IV NS IRRIG 3000ML ARTHROMATIC (IV SOLUTION) ×14 IMPLANT
KIT ROOM TURNOVER WOR (KITS) ×4 IMPLANT
MANIFOLD NEPTUNE II (INSTRUMENTS) ×2 IMPLANT
NS IRRIG 1000ML POUR BTL (IV SOLUTION) ×2 IMPLANT
PACK CYSTO (CUSTOM PROCEDURE TRAY) ×4 IMPLANT
PLUG CATH AND CAP STER (CATHETERS) IMPLANT
SET ASPIRATION TUBING (TUBING) IMPLANT
SHEATH ACCESS URETERAL 38CM (SHEATH) ×2 IMPLANT
SYR 30ML LL (SYRINGE) IMPLANT
SYRINGE IRR TOOMEY STRL 70CC (SYRINGE) IMPLANT
TUBE CONNECTING 12'X1/4 (SUCTIONS) ×1
TUBE CONNECTING 12X1/4 (SUCTIONS) ×1 IMPLANT
WATER STERILE IRR 3000ML UROMA (IV SOLUTION) ×4 IMPLANT
WATER STERILE IRR 500ML POUR (IV SOLUTION) ×2 IMPLANT

## 2016-01-26 NOTE — Transfer of Care (Signed)
  Last Vitals:  Vitals:   01/26/16 1144 01/26/16 1535  BP: (!) 156/78 (!) 166/86  Pulse: 75 77  Resp: 16 14  Temp: 36.8 C 36.4 C    Last Pain:  Vitals:   01/26/16 1535  TempSrc:   PainSc: 0-No pain      Patients Stated Pain Goal: 5 (01/26/16 1201)  Immediate Anesthesia Transfer of Care Note  Patient: [place] Nickel  Procedure(s) Performed: Procedure(s) (LRB): TRANSURETHRAL RESECTION OF THE PROSTATE (TURP) ([name]) CYSTOSCOPY/RETROGRADE/URETEROSCOPY/STONE EXTRACTION WITH BASKET (Right) CYSTOSCOPY WITH STENT PLACEMENT (Right) HOLMIUM LASER APPLICATION (Right)  Patient Location: PACU  Anesthesia Type: General  Level of Consciousness: awake, alert  and oriented  Airway & Oxygen Therapy: Patient Spontanous Breathing and Patient connected to nasal cannula oxygen  Post-op Assessment: Report given to PACU RN and Post -op Vital signs reviewed and stable  Post vital signs: Reviewed and stable  Complications: No apparent anesthesia complications

## 2016-01-26 NOTE — Anesthesia Preprocedure Evaluation (Addendum)
Anesthesia Evaluation  Patient identified by MRN, date of birth, ID band Patient awake    Reviewed: Allergy & Precautions, H&P , NPO status , Patient's Chart, lab work & pertinent test results, reviewed documented beta blocker date and time   Airway Mallampati: II  TM Distance: >3 FB Neck ROM: full    Dental no notable dental hx. (+) Caps   Pulmonary former smoker,    Pulmonary exam normal breath sounds clear to auscultation       Cardiovascular hypertension,  Rhythm:regular Rate:Normal     Neuro/Psych negative neurological ROS  negative psych ROS   GI/Hepatic Neg liver ROS, GERD  Poorly Controlled,H/o esophageal cancer   Endo/Other  negative endocrine ROS  Renal/GU negative Renal ROS  negative genitourinary   Musculoskeletal negative musculoskeletal ROS (+)   Abdominal   Peds negative pediatric ROS (+)  Hematology negative hematology ROS (+)   Anesthesia Other Findings Hypertension  Esophageal cancer   Enlarged prostate    Allergy   Radiation    [name]'s esophagus   Dysrhythmia        GERD          Reproductive/Obstetrics negative OB ROS                           Anesthesia Physical Anesthesia Plan  ASA: III  Anesthesia Plan: General   Post-op Pain Management:    Induction: Intravenous, Rapid sequence and Cricoid pressure planned  Airway Management Planned: Oral ETT  Additional Equipment:   Intra-op Plan: Utilization of Controlled Hypotension per surrgeon request  Post-operative Plan:   Informed Consent: I have reviewed the patients History and Physical, chart, labs and discussed the procedure including the risks, benefits and alternatives for the proposed anesthesia with the patient or authorized representative who has indicated his/her understanding and acceptance.   Dental Advisory Given and Dental advisory given  Plan Discussed with: CRNA and  Surgeon  Anesthesia Plan Comments:       Anesthesia Quick Evaluation

## 2016-01-26 NOTE — Anesthesia Procedure Notes (Addendum)
Procedure Name: Intubation Date/Time: 01/26/2016 1:21 PM Performed by: [name] Pre-anesthesia Checklist: Patient identified, Emergency Drugs available, Suction available and Patient being monitored Patient Re-evaluated:Patient Re-evaluated prior to inductionOxygen Delivery Method: Circle system utilized Preoxygenation: Pre-oxygenation with 100% oxygen Intubation Type: IV induction, Rapid sequence and Cricoid Pressure applied Laryngoscope Size: Glidescope and 4 Grade View: Grade I Tube type: Oral Tube size: 7.5 mm Number of attempts: 1 Airway Equipment and Method: Oral airway,  Patient positioned with wedge pillow,  Rigid stylet and Video-laryngoscopy Placement Confirmation: ETT inserted through vocal cords under direct vision,  positive ETCO2 and breath sounds checked- equal and bilateral Secured at: 22 cm Tube secured with: Tape Dental Injury: Teeth and Oropharynx as per pre-operative assessment  Comments: Glidescope used because it was used on previous GA. OGT inserted and placed to suction. Bilious contents in suction canister.

## 2016-01-26 NOTE — Discharge Instructions (Signed)
Transurethral Resection of the Prostate ° °Care After ° °Refer to this sheet in the next few weeks. These discharge instructions provide you with general information on caring for yourself after you leave the hospital. Your caregiver may also give you specific instructions. Your treatment has been planned according to the most current medical practices available, but unavoidable complications sometimes occur. If you have any problems or questions after discharge, please call your caregiver. ° °HOME CARE INSTRUCTIONS  ° °Medications °· You may receive medicine for pain management. As your level of discomfort decreases, adjustments in your pain medicines may be made.  °· Take all medicines as directed.  °· You may be given a medicine (antibiotic) to kill germs following surgery. Finish all medicines. Let your caregiver know if you have any side effects or problems from the medicine.  °· If you are on aspirin, it would be best not to restart the aspirin until the blood in the urine clears °Hygiene °· You can take a shower after surgery.  °· You should not take a bath while you still have the urethral catheter. °Activity °· You will be encouraged to get out of bed as much as possible and increase your activity level as tolerated.  °· Spend the first week in and around your home. For 3 weeks, avoid the following:  °· Straining.  °· Running.  °· Strenuous work.  °· Walks longer than a few blocks.  °· Riding for extended periods.  °· Sexual relations.  °· Do not lift heavy objects (more than 20 pounds) for at least 1 month. When lifting, use your arms instead of your abdominal muscles.  °· You will be encouraged to walk as tolerated. Do not exert yourself. Increase your activity level slowly. Remember that it is important to keep moving after an operation of any type. This cuts down on the possibility of developing blood clots.  °· Your caregiver will tell you when you can resume driving and light housework. Discuss this  at your first office visit after discharge. °Diet °· No special diet is ordered after a TURP. However, if you are on a special diet for another medical problem, it should be continued.  °· Normal fluid intake is usually recommended.  °· Avoid alcohol and caffeinated drinks for 2 weeks. They irritate the bladder. Decaffeinated drinks are okay.  °· Avoid spicy foods.  °Bladder Function °· For the first 10 days, empty the bladder whenever you feel a definite desire. Do not try to hold the urine for long periods of time.  °· Urinating once or twice a night even after you are healed is not uncommon.  °· You may see some recurrence of blood in the urine after discharge from the hospital. This usually happens within 2 weeks after the procedure.If this occurs, force fluids again as you did in the hospital and reduce your activity.  °Bowel Function °· You may experience some constipation after surgery. This can be minimized by increasing fluids and fiber in your diet. Drink enough water and fluids to keep your urine clear or pale yellow.  °· A stool softener may be prescribed for use at home. Do not strain to move your bowels.  °· If you are requiring increased pain medicine, it is important that you take stool softeners to prevent constipation. This will help to promote proper healing by reducing the need to strain to move your bowels.  °Sexual Activity °· Semen movement in the opposite direction and into the bladder (  retrograde ejaculation) may occur. Since the semen passes into the bladder, cloudy urine can occur the first time you urinate after intercourse. Or, you may not have an ejaculation during erection. Ask your caregiver when you can resume sexual activity. Retrograde ejaculation and reduced semen discharge should not reduce one's pleasure of intercourse.  °Postoperative Visit °· Arrange the date and time of your after surgery visit with your caregiver.  °Return to Work °· After your recovery is complete, you will  be able to return to work and resume all activities. Your caregiver will inform you when you can return to work.  °Foley Catheter Care °A soft, flexible tube (Foley catheter) may have been placed in your bladder to drain urine and fluid. Follow these instructions: °Taking Care of the Catheter °· Keep the area where the catheter leaves your body clean.  °· Attach the catheter to the leg so there is no tension on the catheter.  °· Keep the drainage bag below the level of the bladder, but keep it OFF the floor.  °· Do not take [place] soaking baths. Your caregiver will give instructions about showering.  °· Wash your hands before touching ANYTHING related to the catheter or bag.  °· Using mild soap and warm water on a washcloth:  °· Clean the area closest to the catheter insertion site using a circular motion around the catheter.  °· Clean the catheter itself by wiping AWAY from the insertion site for several inches down the tube.  °· NEVER wipe upward as this could sweep bacteria up into the urethra (tube in your body that normally drains the bladder) and cause infection.  °· Place a small amount of sterile lubricant at the tip of the penis where the catheter is entering.  °Taking Care of the Drainage Bags °· Two drainage bags may be taken home: a large overnight drainage bag, and a smaller leg bag which fits underneath clothing.  °· It is okay to wear the overnight bag at any time, but NEVER wear the smaller leg bag at night.  °· Keep the drainage bag well below the level of your bladder. This prevents backflow of urine into the bladder and allows the urine to drain freely.  °· Anchor the tubing to your leg to prevent pulling or tension on the catheter. Use tape or a leg strap provided by the hospital.  °· Empty the drainage bag when it is 1/2 to 3/4 full. Wash your hands before and after touching the bag.  °· Periodically check the tubing for kinks to make sure there is no pressure on the tubing which could restrict  the flow of urine.  °Changing the Drainage Bags °· Cleanse both ends of the clean bag with alcohol before changing.  °· Pinch off the rubber catheter to avoid urine spillage during the disconnection.  °· Disconnect the dirty bag and connect the clean one.  °· Empty the dirty bag carefully to avoid a urine spill.  °· Attach the new bag to the leg with tape or a leg strap.  °Cleaning the Drainage Bags °· Whenever a drainage bag is disconnected, it must be cleaned quickly so it is ready for the next use.  °· Wash the bag in warm, soapy water.  °· Rinse the bag thoroughly with warm water.  °· Soak the bag for 30 minutes in a solution of white vinegar and water (1 cup vinegar to 1 quart warm water).  °· Rinse with warm water.  °SEEK MEDICAL   CARE IF:   You have chills or night sweats.   You are leaking around your catheter or have problems with your catheter. It is not uncommon to have sporadic leakage around your catheter as a result of bladder spasms. If the leakage stops, there is not much need for concern. If you are uncertain, call your caregiver.   You develop side effects that you think are coming from your medicines.  SEEK IMMEDIATE MEDICAL CARE IF:   You are suddenly unable to urinate. Check to see if there are any kinks in the drainage tubing that may cause this. If you cannot find any kinks, call your caregiver immediately. This is an emergency.   You develop shortness of breath or chest pains.   Bleeding persists or clots develop in your urine.   You have a fever.   You develop pain in your back or over your lower belly (abdomen).   You develop pain or swelling in your legs.   Any problems you are having get worse rather than better.  MAKE SURE YOU:   Understand these instructions.   Will watch your condition.   Will get help right away if you are not doing well or get worse.  Document Released: 04/10/2005 Document Revised: 12/21/2010 Document Reviewed: 12/02/2008 [facility]  Patient Information 2012 [place].Transurethral Resection of the Prostate Care After    Refer to this sheet in the next few weeks. These discharge instructions provide you with general information on caring for yourself after you leave the hospital. Your caregiver may also give you specific instructions. Your treatment has been planned according to the most current medical practices available, but unavoidable complications sometimes occur. If you have any problems or questions after discharge, please call your caregiver.POSTOPERATIVE CARE AFTER URETEROSCOPY  Stent management  *Stents are often left in after ureteroscopy and stone treatment. If left in, they often cause urinary frequency, urgency, occasional blood in the urine, as well as flank discomfort with urination. These are all expected issues, and should resolve after the stent is removed. *Often times, a small thread is left on the end of the stent, and brought out through the urethra. If so, this is used to remove the stent, making it unnecessary to look in the bladder with a scope in the office to remove the stent. If a thread is left on, did not pull on it until instructed. *It is okay to pull the string to remove the stent on Monday morning.  Diet  Once you have adequately recovered from anesthesia, you may gradually advance your diet, as tolerated, to your regular diet.  Activities  You may gradually increase your activities to your normal unrestricted level the day following your procedure.  Medications  You should resume all preoperative medications. If you are on aspirin-like compounds, you should not resume these until the blood clears from your urine. If given an antibiotic by the surgeon, take these until they are completed. You may also be given, if you have a stent, medications to decrease the urinary frequency and urgency.  Pain  After ureteroscopy, there may be some pain on the side of the scope. Take your pain  medicine for this. Usually, this pain resolves within a day or 2.  Fever  Please report any fever over 100 to the doctor.

## 2016-01-26 NOTE — Op Note (Signed)
Preoperative diagnosis: 1. Bladder outlet obstruction secondary to BPH 2. Right ureteral calculus  Postoperative diagnosis:  1. Bladder outlet obstruction secondary to BPH 2. Right ureteral calculus  Procedure:  1. Cystoscopy 2. Transurethral resection of the prostate. 3. Right retrograde ureteropyelogram with fluoroscopic interpretation 4. Right ureteroscopy with holmium laser lithotripsy and extraction of right ureteral stone 5. Placement of 6 French by 26 centimeter contour double-J stent with tether  Surgeon: Lillette Boxer. Jannice Beitzel, M.D.  Anesthesia: General  Complications: None  Drain: Foley catheter, 26 centimeter by 6 French contour double-J stent with tether  EBL: 100 milliliters  Specimens: 1. Stone fragments.-to the patient 2. TURP chips-to pathology    Indication: David [name] is a patient with bladder outlet obstruction secondary to benign prostatic hyperplasia.  In addition, he has a symptomatic right ureteral stone.  We talked about treatment options of this-lithotripsy versus ureteroscopy.  The patient does have significant reflux and cannot lie flat for the shockwave procedure.  Additionally, he has symptomatic BPH.  We have talked about TURP as primary management for his BPH, which is getting worse despite him being on medical therapy.  After reviewing the management options for treatment, he elected to proceed with the above surgical procedure(s). We have discussed the potential benefits and risks of the procedure, side effects of the proposed treatment, the likelihood of the patient achieving the goals of the procedure, and any potential problems that might occur during the procedure or recuperation. Informed consent has been obtained.  Description of procedure:  The patient was identified in the holding area.He received preoperative antibiotics. He was then taken to the operating room. General anesthetic was administered.  The patient was then placed in the  dorsal lithotomy position, prepped and draped in the usual sterile fashion. Timeout was then performed.  A 23 French panendoscope was advanced to the urethra which was normal except for trilobar hypertrophy with obstruction.  The bladder was entered and inspected circumferentially.  Mild/moderate trabeculations throughout.  No urothelial lesions were noted.  Ureteral orifices were normal in configuration and location.  The right ureteral orifice was cannulated with a 6 [place] open-ended catheter.  Retrograde ureteropyelogram was performed.  Omnipaque was used for the retrograde-this revealed a normal mid and distal ureter.  There was a filling defect in the mid ureter with proximal hydroureteronephrosis.  Calyces were clubbed, pelvis was somewhat enlarged.  No filling defects were seen.  More proximal to the presumed stone at the filling defect.  A 0.038 inch sensor-tip guidewire was advanced through the open-ended catheter, past the stone and into the right upper collecting system.  The open-ended catheter and cystoscope were then removed.  I dilated the ureteral orifice, and the distal/mid ureter first with the inner core and then with the entire 12/14 ureteral access catheter.  The access catheter was then removed with the guidewire being left in place.  I then negotiated a 6 French semirigid ureteroscope, dual-lumen, up to the stone.  The urethra and the bladder were easily traversed.  The stone was encountered in the proximal/mid ureter.  It was large enough to be fragmented.  Prior to extraction.  The 365 micron fiber was then advanced through the scope.  Using holmium laser energy, the stone was fragmented into possibly 6-8 smaller fragments.  These fragments were then easily removed with the engage basket without significant trauma to the ureter.  Stone fragments were then saved.  The scope was then readvanced to the ureter all the way up to  the UPJ.  No more stones were seen.  The ureteroscope was  removed.  The wire was backloaded into a cystoscope.  I then placed, following TURP, a 6 [place] by 26 centimeter contour double-J stent, the string being left on.   Following ureteroscopic stone management, a resectoscope sheath was placed using the obturator, and the resectoscope, loop and telescope were placed.  The ureteral orifices were again identified and marked so as to be avoided during the procedure.  The prostate adenoma was then resected utilizing loop cautery resection with the bipolar cutting loop.  The prostate adenoma from the bladder neck back to the verumontanum was resected beginning at the six o'clock position and then extended to include the right and left lobes of the prostate and anterior prostate, respectively. Care was taken not to resect distal to the verumontanum.  Hemostasis was then achieved with the cautery and the bladder was emptied and reinspected with no significant bleeding noted at the end of the procedure.  Resected chips were irrigated from the bladder with the evacuator and sent to pathology.  The string from the stent was brought out through the urethra and taped to the penis.  A 3 way catheter was then placed into the bladder and placed on continuous bladder irrigation.  The patient appeared to tolerate the procedure well and without complications. The patient was able to be awakened and transferred to the recovery unit in satisfactory condition. He tolerated the procedure well.

## 2016-01-26 NOTE — H&P (Signed)
Urology History and Physical Exam  CC: difficulty urinating, right-sided stone  HPI: 75 year old male with a history of symptomatic BPH and recent symptomatic right ureteral stones.  The patient does have uric acid urolithiasis.  He has been on chemolysis for his right-sided renal calculi that has recently become symptomatic with hydronephrosis and right flank pain.  The patient does have worsening BPH and obstructive symptomatology.  As the patient is symptomatic with both of the above, he presents at this time for cystoscopy, right retrograde ureteral pyelogram, right ureteroscopy, holmium laser lithotripsy and extraction of his right ureteral stone as well as TURP.  The patient is aware of risks and complications of both procedures and desires to proceed.  PMH: Past Medical History:  Diagnosis Date  . Allergy   . Anxiety   . Arthritis   . [name]'s esophagus   . Basal cell carcinoma    nose, face, back  . Dysrhythmia   . Enlarged prostate   . Esophageal cancer ([place])   . Food impaction of esophagus 08/20/2014  . GERD (gastroesophageal reflux disease)    needs 30* elevation since total esophagectomy -uses wedge at home  . Hypertension   . Kidney stone   . Radiation   . Urinary frequency   . Wears glasses     PSH: Past Surgical History:  Procedure Laterality Date  . APPENDECTOMY  1961  . CERVICAL LAMINECTOMY  1980  . COLONOSCOPY     x2  . COMPLETE ESOPHAGECTOMY [name] 02/01/2015   Procedure: TRANSHIATAL TOTAL ESOPHAGECTOMY  ;  Surgeon: Grace Isaac, MD;  Location: [facility];  Service: Thoracic;  Laterality: [name];  . ESOPHAGOGASTRODUODENOSCOPY [name] 08/20/2014   Procedure: ESOPHAGOGASTRODUODENOSCOPY (EGD);  Surgeon: Gatha Mayer, MD;  [facility] END[facility];  Service: Endoscopy;  Laterality: [name];  . ESOPHAGOGASTROSTOMY  02/01/2015   Procedure: CERVICAL ESOPHAGOGASTROSTOMY;  Surgeon: Grace Isaac, MD;  Location: [facility];  Service: Thoracic;;  . EUS [name] 10/15/2014   Procedure:  UPPER ENDOSCOPIC ULTRASOUND (EUS) LINEAR;  Surgeon: Milus Banister, MD;  Location: [facility] ENDOSCOPY;  Service: Endoscopy;  Laterality: [name];  radial linear  . JEJUNOSTOMY  02/01/2015   Procedure: JEJUNOSTOMY;  Surgeon: Grace Isaac, MD;  Location: [facility];  Service: Thoracic;;  . Edwena Blow  02/01/2015   Procedure: Edwena Blow;  Surgeon: Grace Isaac, MD;  Location: [facility];  Service: Thoracic;;  . TOTAL KNEE ARTHROPLASTY  2010   right    Allergies: Allergies  Allergen Reactions  . Dilaudid [Hydromorphone Hcl] Hives and Itching  . Fentanyl Itching  . Nexium [Esomeprazole Magnesium] Other (See Comments)    Difficulty urinating and passing stool; dry mouth  . Other     Thinks it was oxycodone; made him hallucinate  . Oxycodone Nausea And Vomiting  . Statins Other (See Comments)    Leg cramping    Medications: No prescriptions prior to admission.     Social History: Social History   Social History  . Marital status: Married    Spouse name: [name]  . Number of children: [name]  . Years of education: [name]   Occupational History  . Not on file.   Social History Main Topics  . Smoking status: Former Smoker    Quit date: 06/07/1961  . Smokeless tobacco: Never Used  . Alcohol use No  . Drug use: No  . Sexual activity: Not Currently   Other Topics Concern  . Not on file   Social History Narrative  . No narrative  on file    Family History: Family History  Problem Relation Age of Onset  . Lymphoma Mother   . Cancer Maternal Uncle   . Cancer Paternal Uncle   . Cancer Paternal Grandmother   . Stomach cancer Neg Hx   . Colon cancer Neg Hx     Review of Systems: Positive: right flank pain, frequency, urgency, slow stream, incomplete emptying, hesitancy, intermittency Negative: reflux..  A further 10 point review of systems was negative except what is listed in the HPI.                  Physical Exam: @VITALS2 @ General: No acute distress.   Awake. Head:  Normocephalic.  Atraumatic. ENT:  EOMI.  Mucous membranes moist Neck:  Supple.  No lymphadenopathy. CV:  S1 present. S2 present. Regular rate. Pulmonary: Equal effort bilaterally.  Clear to auscultation bilaterally. Abdomen: Obese.Soft.  9 tender to palpation. Skin:  Normal turgor.  No visible rash. Extremity: No gross deformity of bilateral upper extremities.  No gross deformity of                             lower extremities. Neurologic: Alert. Appropriate mood.  Penis:  circumcised.  No lesions. Urethra: Orthotopic meatus. Scrotum: No lesions.  No ecchymosis.  No erythema. Testicles: Descended bilaterally.  No masses bilaterally. Epididymis: Palpable bilaterally. Nontender to palpation.  Studies:  No results for input(s): HGB, WBC, PLT in the last 72 hours.  No results for input(s): NA, K, CL, CO2, BUN, CREATININE, CALCIUM, GFRNONAA, GFRAA in the last 72 hours.  Invalid input(s): MAGNESIUM   No results for input(s): INR, APTT in the last 72 hours.  Invalid input(s): PT   Invalid input(s): ABG    Assessment:  Right upper ureteral stone with hydronephrosis, symptomatic BPH  Plan: Cystoscopy, right retrograde ureteropyelogram, right ureteroscopy, holmium laser lithotripsy and extraction of right ureteral stone, TURP

## 2016-01-27 ENCOUNTER — Encounter (HOSPITAL_BASED_OUTPATIENT_CLINIC_OR_DEPARTMENT_OTHER): Payer: Self-pay | Admitting: Urology

## 2016-01-27 DIAGNOSIS — N320 Bladder-neck obstruction: Secondary | ICD-10-CM | POA: Diagnosis not present

## 2016-01-27 DIAGNOSIS — Z87442 Personal history of urinary calculi: Secondary | ICD-10-CM | POA: Diagnosis not present

## 2016-01-27 DIAGNOSIS — N138 Other obstructive and reflux uropathy: Secondary | ICD-10-CM | POA: Diagnosis not present

## 2016-01-27 DIAGNOSIS — F419 Anxiety disorder, unspecified: Secondary | ICD-10-CM | POA: Diagnosis not present

## 2016-01-27 DIAGNOSIS — N401 Enlarged prostate with lower urinary tract symptoms: Secondary | ICD-10-CM | POA: Diagnosis not present

## 2016-01-27 DIAGNOSIS — N132 Hydronephrosis with renal and ureteral calculous obstruction: Secondary | ICD-10-CM | POA: Diagnosis not present

## 2016-01-27 LAB — CREATININE, SERUM
CREATININE: 0.84 mg/dL (ref 0.61–1.24)
GFR calc Af Amer: >60 mL/min (ref >60)

## 2016-01-27 MED ORDER — CEPHALEXIN 500 MG PO CAPS: 500.0000 mg | ORAL_CAPSULE | Freq: Two times a day (BID) | ORAL | 0 refills | Status: DC

## 2016-01-27 NOTE — Progress Notes (Signed)
Pt D/C'd to home, acknowledge understanding of instructions. SRP, RN

## 2016-01-27 NOTE — Anesthesia Postprocedure Evaluation (Signed)
Anesthesia Post Note  Patient: David [name]  Procedure(s) Performed: Procedure(s) (LRB): TRANSURETHRAL RESECTION OF THE PROSTATE (TURP) ([name]) CYSTOSCOPY/RETROGRADE/URETEROSCOPY/STONE EXTRACTION WITH BASKET (Right) CYSTOSCOPY WITH STENT PLACEMENT (Right) HOLMIUM LASER APPLICATION (Right)  Patient location during evaluation: PACU Anesthesia Type: General Level of consciousness: awake and alert Pain management: pain level controlled Vital Signs Assessment: post-procedure vital signs reviewed and stable Respiratory status: spontaneous breathing, nonlabored ventilation, respiratory function stable and patient connected to nasal cannula oxygen Cardiovascular status: blood pressure returned to baseline and stable Postop Assessment: no signs of nausea or vomiting Anesthetic complications: no    Last Vitals:  Vitals:   01/26/16 2140 01/27/16 0519  BP: (!) 149/71 (!) 147/79  Pulse: 91 88  Resp: 18 18  Temp: 36.5 C 36.5 C    Last Pain:  Vitals:   01/27/16 0519  TempSrc: Oral  PainSc:                  [name]

## 2016-02-10 DIAGNOSIS — H2513 Age-related nuclear cataract, bilateral: Secondary | ICD-10-CM | POA: Diagnosis not present

## 2016-02-28 DIAGNOSIS — N200 Calculus of kidney: Secondary | ICD-10-CM | POA: Diagnosis not present

## 2016-02-28 DIAGNOSIS — N401 Enlarged prostate with lower urinary tract symptoms: Secondary | ICD-10-CM | POA: Diagnosis not present

## 2016-02-29 ENCOUNTER — Telehealth: Payer: Self-pay | Admitting: *Deleted

## 2016-02-29 NOTE — Telephone Encounter (Signed)
"  I need to know when my next appointment is with Dr. Benay Spice."  Appointment information provided.  No further questions.

## 2016-03-10 ENCOUNTER — Telehealth: Payer: Self-pay | Admitting: Oncology

## 2016-03-10 ENCOUNTER — Ambulatory Visit (HOSPITAL_BASED_OUTPATIENT_CLINIC_OR_DEPARTMENT_OTHER): Payer: Medicare Other | Admitting: Oncology

## 2016-03-10 VITALS — BP 147/80 | HR 81 | Temp 97.5°F | Resp 18 | Ht 75.0 in | Wt 187.9 lb

## 2016-03-10 DIAGNOSIS — C155 Malignant neoplasm of lower third of esophagus: Secondary | ICD-10-CM

## 2016-03-10 NOTE — Progress Notes (Signed)
  [place] OFFICE PROGRESS NOTE   Diagnosis: Esophagus cancer  INTERVAL HISTORY:   Mr. Bowder returns as scheduled. He underwent a TUR and laser lithotripsy/ureteral stone extraction on 01/26/2016. He has a good appetite and energy level. He eats 3 times during the day and in the evening. He continues to slowly lose weight. He has intermittent discomfort in the subxiphoid region and right flank after eating. No consistent pain. The urination is better following the TUR.  Objective:  Vital signs in last 24 hours:  Blood pressure (!) 147/80, pulse 81, temperature 97.5 F (36.4 C), temperature source Oral, resp. rate 18, height 6\' 3"  (1.905 m), weight 187 lb 14.4 oz (85.2 kg), SpO2 98 %.    HEENT: Neck without mass Lymphatics: No cervical, supraclavicular, axillary, or inguinal nodes Resp: Lungs clear bilaterally Cardio: Regular rate and rhythm GI: No hepatosplenomegaly, no mass, nontender Vascular: No leg edema  Medications: I have reviewed the patient's current medications.  Assessment/Plan: 1. Adenocarcinoma of the distal esophagus,[id number]  Presenting with a food impaction and dysphagia 08/20/2014  Staging CT scans of the chest, abdomen, and pelvis 10/06/2014 with indeterminate pulmonary nodules, a 9 mm paraesophageal lymph node, and circumferential thickening of the distal esophagus  PET scan 10/23/2014 confirmed hypermetabolic thickening at the distal esophagus with small paraesophageal nodes having faint nonspecific activity. A pleural-based focus of consolidation at the posterior right lower lobe had low metabolic activity-favored to be a benign process.  Initiation of concurrent weekly Taxol/carboplatin and radiation on 11/02/2014, radiation completed 12/09/2014  CTs 12/31/2014 revealed distal esophageal wall thickening with adjacent small lymph nodes suspicious for metastases, stable lung nodule posterior to the right bronchus intermedius and the persistent 5  mm nodule in the right lower lobe  Esophagectomy 02/01/2015 with the pathology confirming a ypT3,ypN3 tumor, positive distal margin  2. Prostatic hypertrophy-Status post TUR 01/26/2016  3. Hypertension  4. History of multiple basal cell skin cancers  5. History of thrombocytopenia secondary to chemotherapy     Disposition:  Mr. Castanos remains in clinical remission from esophagus cancer. He continues to slowly lose weight following the esophagectomy procedure. No clinical evidence for progression of the esophagus cancer. I encouraged him to increase his calorie intake as tolerated. He will return for an office visit in 4 months.  Betsy Coder, MD  03/10/2016  11:01 AM

## 2016-03-10 NOTE — Telephone Encounter (Signed)
Appointments scheduled per 03/10/16 los. AVS and appointment schedule was given to patient,per 03/10/16 los.  °

## 2016-04-21 ENCOUNTER — Other Ambulatory Visit: Payer: Self-pay | Admitting: Nurse Practitioner

## 2016-05-03 ENCOUNTER — Encounter: Payer: Self-pay | Admitting: Internal Medicine

## 2016-05-04 ENCOUNTER — Ambulatory Visit (INDEPENDENT_AMBULATORY_CARE_PROVIDER_SITE_OTHER): Payer: Medicare Other | Admitting: Cardiothoracic Surgery

## 2016-05-04 ENCOUNTER — Encounter: Payer: Self-pay | Admitting: Cardiothoracic Surgery

## 2016-05-04 VITALS — BP 143/81 | HR 86 | Resp 20 | Ht 75.0 in | Wt 192.0 lb

## 2016-05-04 DIAGNOSIS — C160 Malignant neoplasm of cardia: Secondary | ICD-10-CM

## 2016-05-04 NOTE — Progress Notes (Signed)
[address]       [place], [address]             [phone]      Raymund A Lagrow  Medical Record [id number] Date of Birth: Nov 20, 1940  Referring: Ladell Pier, MD Primary Care: Marton Redwood, MD  Chief Complaint:   POST OP FOLLOW UP  History of Present Illness:     Cancer of lower third of esophagus [facility])   Staging form: Esophagus - Squamous Cell Carcinoma, AJCC 7th Edition     Clinical: Stage IIIB (T3, N2, M0) - Signed by Ladell Pier, MD on 10/19/2014     Pathologic stage from 02/01/2015: Stage IIIC (T3, N3, cM0, G4 - Undifferentiated, Location: Middle) - Signed by Grace Isaac, MD on 02/04/2015  02/01/2015 Operative  REPORT PREOPERATIVE DIAGNOSIS: 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 SURGICAL PROCEDURE: Transhiatal total esophagectomy with cervical esophagogastrostomy, pyloromyotomy and feeding jejunostomy tube. SURGEON: Lanelle Bal, MD  Patient  Now 14 months post op. Weight stable 197-201. He continues to remain active, denies any trouble swallowing. He does note he has increased his intake of pickled artichokes Likely with increased sodium uptake which may be if affecting his blood pressure,     Wt Readings from Last 3 Encounters:  05/04/16 192 lb (87.1 kg)  03/10/16 187 lb 14.4 oz (85.2 kg)  01/26/16 191 lb (86.6 kg)     Past Medical History:  Diagnosis Date  . Allergy   . Anxiety   . Arthritis   . [name]'s esophagus   . Basal cell carcinoma    nose, face, back  . Dysrhythmia   . Enlarged prostate   . Esophageal cancer ([place])   . Food impaction of esophagus 08/20/2014  . GERD (gastroesophageal reflux disease)    needs 30* elevation since total esophagectomy -uses wedge at home  . Hypertension   . Kidney  stone   . Radiation   . Urinary frequency   . Wears glasses      History  Smoking Status  . Former Smoker  . Quit date: 06/07/1961  Smokeless Tobacco  . Never Used    History  Alcohol Use No     Allergies  Allergen Reactions  . Dilaudid [Hydromorphone Hcl] Hives and Itching  . Fentanyl Itching  . Nexium [Esomeprazole Magnesium] Other (See Comments)    Difficulty urinating and passing stool; dry mouth  . Other     Thinks it was oxycodone; made him hallucinate  . Oxycodone Nausea And Vomiting  . Statins Other (See Comments)    Leg cramping    Current Outpatient Prescriptions  Medication Sig Dispense Refill  . ALPRAZolam (XANAX) 0.5 MG tablet Take 0.5 mg by mouth at bedtime as needed for sleep.   4  . pantoprazole (PROTONIX) 20 MG tablet Take 40 mg by mouth daily.     No current facility-administered medications for this visit.        Physical Exam: BP (!) 143/81 (BP Location: Left Arm, Cuff Size: Normal)   Pulse 86   Resp 20   Ht 6\' 3"  (1.905 m)   Wt 192 lb (87.1 kg)   SpO2 97%   BMI 24.00 kg/m   General appearance: alert, cooperative and no distress Neurologic: intact Heart: regular rate and rhythm, S1, S2 normal, no murmur, click, rub or gallop  Lungs: clear to auscultation bilaterally Abdomen: soft, non-tender; bowel sounds normal; no masses,  no organomegaly Extremities: extremities normal, atraumatic, no cyanosis or edema and Homans sign is negative, no sign of DVT Wound: Abdominal and left neck incision well-healed without signs of infection, jejunostomy feeding tube is in place.  he has no evidence of incisional hernias No adenopathy , no cervical or supraclavicular or axillary adenopathy appreciated  Diagnostic Studies & Laboratory data:     Recent Radiology Findings:   No results found.    Recent Lab Findings: Lab Results  Component Value Date   WBC 7.7 02/11/2015   HGB 14.3 01/26/2016   HCT 36.9 (L) 02/11/2015   PLT 169 02/11/2015    GLUCOSE 99 02/11/2015   ALT 29 01/28/2015   AST 31 01/28/2015   NA 140 02/11/2015   K 3.7 02/11/2015   CL 104 02/11/2015   CREATININE 0.84 01/27/2016   BUN 11 02/11/2015   CO2 29 02/11/2015   INR 1.06 01/28/2015     Assessment / Plan:      Patient is approximately 15 months postop following transhiatal total esophagectomy, with pathologic stage IIIC disease with 8 of 9 positive lymph nodes, and microscopic positive gastric margin not present on frozen section but noted on permanent section. Has been off BP meds but notes BP has increased , will discuss with DR Brigitte Pulse   Patient remains stable without evidence of recurrence at this point I'll plan to see him back in June, he has a follow-up appointment with oncology in March    Grace Isaac MD      [address] [place] [address] Office [phone]   Beeper [phone]  05/04/2016 2:35 PM

## 2016-05-04 NOTE — Patient Instructions (Signed)
Wt Readings from Last 3 Encounters:  05/04/16 192 lb (87.1 kg)  03/10/16 187 lb 14.4 oz (85.2 kg)  01/26/16 191 lb (86.6 kg)

## 2016-05-12 DIAGNOSIS — I10 Essential (primary) hypertension: Secondary | ICD-10-CM | POA: Diagnosis not present

## 2016-05-12 DIAGNOSIS — Z125 Encounter for screening for malignant neoplasm of prostate: Secondary | ICD-10-CM | POA: Diagnosis not present

## 2016-05-12 DIAGNOSIS — R8299 Other abnormal findings in urine: Secondary | ICD-10-CM | POA: Diagnosis not present

## 2016-05-12 DIAGNOSIS — E784 Other hyperlipidemia: Secondary | ICD-10-CM | POA: Diagnosis not present

## 2016-05-17 DIAGNOSIS — Z0000 Encounter for general adult medical examination without abnormal findings: Secondary | ICD-10-CM | POA: Diagnosis not present

## 2016-05-17 DIAGNOSIS — Z86010 Personal history of colonic polyps: Secondary | ICD-10-CM | POA: Diagnosis not present

## 2016-05-17 DIAGNOSIS — I10 Essential (primary) hypertension: Secondary | ICD-10-CM | POA: Diagnosis not present

## 2016-05-17 DIAGNOSIS — Z87448 Personal history of other diseases of urinary system: Secondary | ICD-10-CM | POA: Diagnosis not present

## 2016-05-17 DIAGNOSIS — E784 Other hyperlipidemia: Secondary | ICD-10-CM | POA: Diagnosis not present

## 2016-05-17 DIAGNOSIS — R634 Abnormal weight loss: Secondary | ICD-10-CM | POA: Diagnosis not present

## 2016-05-17 DIAGNOSIS — Z1389 Encounter for screening for other disorder: Secondary | ICD-10-CM | POA: Diagnosis not present

## 2016-05-17 DIAGNOSIS — Z6824 Body mass index (BMI) 24.0-24.9, adult: Secondary | ICD-10-CM | POA: Diagnosis not present

## 2016-05-17 DIAGNOSIS — Z8501 Personal history of malignant neoplasm of esophagus: Secondary | ICD-10-CM | POA: Diagnosis not present

## 2016-06-13 ENCOUNTER — Other Ambulatory Visit: Payer: Self-pay | Admitting: Registered Nurse

## 2016-06-13 DIAGNOSIS — R0609 Other forms of dyspnea: Secondary | ICD-10-CM | POA: Diagnosis not present

## 2016-06-13 DIAGNOSIS — Z6824 Body mass index (BMI) 24.0-24.9, adult: Secondary | ICD-10-CM | POA: Diagnosis not present

## 2016-06-13 DIAGNOSIS — R05 Cough: Secondary | ICD-10-CM | POA: Diagnosis not present

## 2016-06-13 DIAGNOSIS — I493 Ventricular premature depolarization: Secondary | ICD-10-CM

## 2016-06-13 DIAGNOSIS — I10 Essential (primary) hypertension: Secondary | ICD-10-CM | POA: Diagnosis not present

## 2016-06-14 ENCOUNTER — Ambulatory Visit (HOSPITAL_BASED_OUTPATIENT_CLINIC_OR_DEPARTMENT_OTHER)
Admission: RE | Admit: 2016-06-14 | Discharge: 2016-06-14 | Disposition: A | Discharge status: Home / Self Care | Payer: Medicare Other | Source: Ambulatory Visit | Attending: Internal Medicine | Admitting: Internal Medicine

## 2016-06-14 DIAGNOSIS — J90 Pleural effusion, not elsewhere classified: Secondary | ICD-10-CM | POA: Diagnosis not present

## 2016-06-14 DIAGNOSIS — R0609 Other forms of dyspnea: Secondary | ICD-10-CM | POA: Diagnosis not present

## 2016-06-14 DIAGNOSIS — I10 Essential (primary) hypertension: Secondary | ICD-10-CM | POA: Insufficient documentation

## 2016-06-14 DIAGNOSIS — I493 Ventricular premature depolarization: Secondary | ICD-10-CM | POA: Insufficient documentation

## 2016-06-14 DIAGNOSIS — I4891 Unspecified atrial fibrillation: Secondary | ICD-10-CM | POA: Diagnosis not present

## 2016-06-14 NOTE — Progress Notes (Signed)
Echocardiogram 2D Echocardiogram has been performed.  David [name] 06/14/2016, 10:05 AM

## 2016-06-22 ENCOUNTER — Encounter: Payer: Self-pay | Admitting: Internal Medicine

## 2016-06-22 ENCOUNTER — Ambulatory Visit (INDEPENDENT_AMBULATORY_CARE_PROVIDER_SITE_OTHER): Payer: Medicare Other | Admitting: Internal Medicine

## 2016-06-22 VITALS — BP 122/72 | HR 81 | Ht 75.0 in | Wt 191.4 lb

## 2016-06-22 DIAGNOSIS — R0609 Other forms of dyspnea: Secondary | ICD-10-CM

## 2016-06-22 DIAGNOSIS — I493 Ventricular premature depolarization: Secondary | ICD-10-CM

## 2016-06-22 MED ORDER — FLECAINIDE ACETATE 100 MG PO TABS: 100.0000 mg | ORAL_TABLET | Freq: Two times a day (BID) | ORAL | 6 refills | Status: DC

## 2016-06-22 NOTE — Patient Instructions (Addendum)
Medication Instructions:    Your physician has recommended you make the following change in your medication:  1) STOP Fosinopril 2) START Flecainide 100 mg twice daily -- you will begin this medication 10-14 days prior to stress test.  --- If you need a refill on your cardiac medications before your next appointment, please call your pharmacy. ---  Labwork:  None ordered  Testing/Procedures: Your physician has requested that you have an exercise tolerance test in 10-14 days. For further information please visit [email or web address]. Please also follow instruction sheet, as given.  ----- You will start Flecainide 10-14 days prior to this testing -----  Follow-Up:  Please call Heather, RN in a week/two after starting Flecainide to let her know how you are doing.   Your physician wants you to follow-up in: 6 months with Dr. Caryl [name].  You will receive a reminder letter in the mail two months in advance. If you don't receive a letter, please call our office to schedule the follow-up appointment.  Thank you for choosing [facility]!!     Any Other Special Instructions Will Be Listed Below (If Applicable).  Flecainide tablets What is this medicine? FLECAINIDE (FLEK a nide) is an antiarrhythmic drug. This medicine is used to prevent irregular heart rhythm. It can also slow down fast heartbeats called tachycardia. This medicine may be used for other purposes; ask your health care provider or pharmacist if you have questions. COMMON BRAND NAME(S): Tambocor What should I tell my health care provider before I take this medicine? They need to know if you have any of these conditions: -abnormal levels of potassium in the blood -heart disease including heart rhythm and heart rate problems -kidney or liver disease -recent heart attack -an unusual or allergic reaction to flecainide, local anesthetics, other medicines, foods, dyes, or preservatives -pregnant or trying to get  pregnant -breast-feeding How should I use this medicine? Take this medicine by mouth with a glass of water. Follow the directions on the prescription label. You can take this medicine with or without food. Take your doses at regular intervals. Do not take your medicine more often than directed. Do not stop taking this medicine suddenly. This may cause serious, heart-related side effects. If your doctor wants you to stop the medicine, the dose may be slowly lowered over time to avoid any side effects. Talk to your pediatrician regarding the use of this medicine in children. While this drug may be prescribed for children as young as 1 year of age for selected conditions, precautions do apply. Overdosage: If you think you have taken too much of this medicine contact a poison control center or emergency room at once. NOTE: This medicine is only for you. Do not share this medicine with others. What if I miss a dose? If you miss a dose, take it as soon as you can. If it is almost time for your next dose, take only that dose. Do not take double or extra doses. What may interact with this medicine? Do not take this medicine with any of the following medications: -amoxapine -arsenic trioxide -certain antibiotics like clarithromycin, erythromycin, gatifloxacin, gemifloxacin, levofloxacin, moxifloxacin, sparfloxacin, or troleandomycin -certain antidepressants called tricyclic antidepressants like amitriptyline, imipramine, or nortriptyline -certain medicines to control heart rhythm like disopyramide, dofetilide, encainide, moricizine, procainamide, propafenone, and quinidine -cisapride -cyclobenzaprine -delavirdine -droperidol -haloperidol -hawthorn -imatinib -levomethadyl -maprotiline -medicines for malaria like chloroquine and halofantrine -pentamidine -phenothiazines like chlorpromazine, mesoridazine, prochlorperazine,  thioridazine -pimozide -quinine -ranolazine -ritonavir -sertindole -ziprasidone This medicine may also interact  with the following medications: -cimetidine -medicines for angina or high blood pressure -medicines to control heart rhythm like amiodarone and digoxin This list may not describe all possible interactions. Give your health care provider a list of all the medicines, herbs, non-prescription drugs, or dietary supplements you use. Also tell them if you smoke, drink alcohol, or use illegal drugs. Some items may interact with your medicine. What should I watch for while using this medicine? Visit your doctor or health care professional for regular checks on your progress. Because your condition and the use of this medicine carries some risk, it is a good idea to carry an identification card, necklace or bracelet with details of your condition, medications and doctor or health care professional. Check your blood pressure and pulse rate regularly. Ask your health care professional what your blood pressure and pulse rate should be, and when you should contact him or her. Your doctor or health care professional also may schedule regular blood tests and electrocardiograms to check your progress. You may get drowsy or dizzy. Do not drive, use machinery, or do anything that needs mental alertness until you know how this medicine affects you. Do not stand or sit up quickly, especially if you are an older patient. This reduces the risk of dizzy or fainting spells. Alcohol can make you more dizzy, increase flushing and rapid heartbeats. Avoid alcoholic drinks. What side effects may I notice from receiving this medicine? Side effects that you should report to your doctor or health care professional as soon as possible: -chest pain, continued irregular heartbeats -difficulty breathing -swelling of the legs or feet -trembling, shaking -unusually weak or tired Side effects that usually do not require  medical attention (report to your doctor or health care professional if they continue or are bothersome): -blurred vision -constipation -headache -nausea, vomiting -stomach pain This list may not describe all possible side effects. Call your doctor for medical advice about side effects. You may report side effects to FDA at 1-800-FDA-1088. Where should I keep my medicine? Keep out of the reach of children. Store at room temperature between 15 and 30 degrees C (59 and 86 degrees F). Protect from light. Keep container tightly closed. Throw away any unused medicine after the expiration date. NOTE: This sheet is a summary. It may not cover all possible information. If you have questions about this medicine, talk to your doctor, pharmacist, or health care provider.  2018 Elsevier/Gold Standard (2007-08-14 16:46:09)    Exercise Stress Electrocardiogram An exercise stress electrocardiogram is a test that is done to evaluate the blood supply to your heart. This test may also be called exercise stress electrocardiography. The test is done while you are walking on a treadmill. The goal of this test is to raise your heart rate. This test is done to find areas of poor blood flow to the heart by determining the extent of coronary artery disease (CAD). CAD is defined as narrowing in one or more heart (coronary) arteries of more than 70%. If you have an abnormal test result, this may mean that you are not getting adequate blood flow to your heart during exercise. Additional testing may be needed to understand why your test was abnormal. Tell a health care provider about:  Any allergies you have.  All medicines you are taking, including vitamins, herbs, eye drops, creams, and over-the-counter medicines.  Any problems you or family members have had with anesthetic medicines.  Any blood disorders you have.  Any surgeries you have had.  Any medical conditions you have.  Possibility of pregnancy, if  this applies. What are the risks? Generally, this is a safe procedure. However, as with any procedure, complications can occur. Possible complications can include:  Pain or pressure in the following areas:  Chest.  Jaw or neck.  Between your shoulder blades.  Radiating down your left arm.  Dizziness or light-headedness.  Shortness of breath.  Increased or irregular heartbeats.  Nausea or vomiting.  Heart attack (rare). What happens before the procedure?  Avoid all forms of caffeine 24 hours before your test or as directed by your health care provider. This includes coffee, tea (even decaffeinated tea), caffeinated sodas, chocolate, cocoa, and certain pain medicines.  Follow your health care provider's instructions regarding eating and drinking before the test.  Take your medicines as directed at regular times with water unless instructed otherwise. Exceptions may include:  If you have diabetes, ask how you are to take your insulin or pills. It is common to adjust insulin dosing the morning of the test.  If you are taking beta-blocker medicines, it is important to talk to your health care provider about these medicines well before the date of your test. Taking beta-blocker medicines may interfere with the test. In some cases, these medicines need to be changed or stopped 24 hours or more before the test.  If you wear a nitroglycerin patch, it may need to be removed prior to the test. Ask your health care provider if the patch should be removed before the test.  If you use an inhaler for any breathing condition, bring it with you to the test.  If you are an outpatient, bring a snack so you can eat right after the stress phase of the test.  Do not smoke for 4 hours prior to the test or as directed by your health care provider.  Do not apply lotions, powders, creams, or oils on your chest prior to the test.  Wear loose-fitting clothes and comfortable shoes for the test. This  test involves walking on a treadmill. What happens during the procedure?  Multiple patches (electrodes) will be put on your chest. If needed, small areas of your chest may have to be shaved to get better contact with the electrodes. Once the electrodes are attached to your body, multiple wires will be attached to the electrodes and your heart rate will be monitored.  Your heart will be monitored both at rest and while exercising.  You will walk on a treadmill. The treadmill will be started at a slow pace. The treadmill speed and incline will gradually be increased to raise your heart rate. What happens after the procedure?  Your heart rate and blood pressure will be monitored after the test.  You may return to your normal schedule including diet, activities, and medicines, unless your health care provider tells you otherwise. This information is not intended to replace advice given to you by your health care provider. Make sure you discuss any questions you have with your health care provider. Document Released: 04/07/2000 Document Revised: 09/16/2015 Document Reviewed: 12/16/2012 Elsevier Interactive Patient Education  2017 [employer].

## 2016-06-22 NOTE — Progress Notes (Signed)
ELECTROPHYSIOLOGY CONSULT NOTE  Patient ID: David [name], MRN: [id number], DOB/AGE: 1940/08/20 76 y.o. Admit date: (Not on file) Date of Consult: 06/22/2016  Primary Physician: Marton Redwood, MD Primary Cardiologist:none  Consulting Physician none  Chief Complaint:  Dyspnea nd PVC   HPI David [name] is a 76 y.o. male  Referred because of PVCs and dyspnea.  He developed a flulike illness about 6 weeks ago. It left him quite dyspneic on exertion. He has a long-standing history of PVCs which have been quiescent but became much more problematic concurrent with the dyspnea. Evaluation of the dyspnea has included a chest x-ray which by report the patient was not all that enlightening. I called to speak with his primary care physician today; he was not available. I wonder whether the x-ray showed   His dyspnea is only mildly better of late. His PVCs may be a little bit better also since his amlodipine was changed to metoprolol. He was also taking fosinopril. He has  noted a dry cough.  The patient has a history of cancer. He has not had recent long travel.  Echocardiogram 2/18 he has 60-65% functionally bicuspid aortic valve without stenosis LAE and RAE  He has a history of esophageal cancer having undergone esophagectomy 10/16  ECG 10/17 demonstrates an isolated right bundle branch block PVC  Past Medical History:  Diagnosis Date  . Allergy   . Anxiety   . Arthritis   . [name]'s esophagus   . Basal cell carcinoma    nose, face, back  . Dysrhythmia   . Enlarged prostate   . Esophageal cancer ([place])   . Food impaction of esophagus 08/20/2014  . GERD (gastroesophageal reflux disease)    needs 30* elevation since total esophagectomy -uses wedge at home  . Hypertension   . Kidney stone   . Radiation   . Urinary frequency   . Wears glasses       Surgical History:  Past Surgical History:  Procedure Laterality Date  . APPENDECTOMY  1961  . CERVICAL LAMINECTOMY  1980  .  COLONOSCOPY     x2  . COMPLETE ESOPHAGECTOMY [name] 02/01/2015   Procedure: TRANSHIATAL TOTAL ESOPHAGECTOMY  ;  Surgeon: Grace Isaac, MD;  Location: [place];  Service: Thoracic;  Laterality: [name];  . CYSTOSCOPY WITH STENT PLACEMENT Right 01/26/2016   Procedure: CYSTOSCOPY WITH STENT PLACEMENT;  Surgeon: Franchot Gallo, MD;  Location: [facility];  Service: Urology;  Laterality: Right;  . CYSTOSCOPY/RETROGRADE/URETEROSCOPY/STONE EXTRACTION WITH BASKET Right 01/26/2016   Procedure: CYSTOSCOPY/RETROGRADE/URETEROSCOPY/STONE EXTRACTION WITH BASKET;  Surgeon: Franchot Gallo, MD;  Location: [facility];  Service: Urology;  Laterality: Right;  . ESOPHAGOGASTRODUODENOSCOPY [name] 08/20/2014   Procedure: ESOPHAGOGASTRODUODENOSCOPY (EGD);  Surgeon: Gatha Mayer, MD;  Location: [facility];  Service: Endoscopy;  Laterality: [name];  . ESOPHAGOGASTROSTOMY  02/01/2015   Procedure: CERVICAL ESOPHAGOGASTROSTOMY;  Surgeon: Grace Isaac, MD;  Location: [place];  Service: Thoracic;;  . EUS [name] 10/15/2014   Procedure: UPPER ENDOSCOPIC ULTRASOUND (EUS) LINEAR;  Surgeon: Milus Banister, MD;  Location: [facility] ENDOSCOPY;  Service: Endoscopy;  Laterality: [name];  radial linear  . HOLMIUM LASER APPLICATION Right [id number]   Procedure: HOLMIUM LASER APPLICATION;  Surgeon: Franchot Gallo, MD;  Location: [facility];  Service: Urology;  Laterality: Right;  . JEJUNOSTOMY  02/01/2015   Procedure: JEJUNOSTOMY;  Surgeon: Grace Isaac, MD;  Location: [place];  Service: Thoracic;;  . Edwena Blow  02/01/2015  Procedure: PYLOROMYOTOMY;  Surgeon: Grace Isaac, MD;  Location: [facility];  Service: Thoracic;;  . TOTAL KNEE ARTHROPLASTY  2010   right  . TRANSURETHRAL RESECTION OF PROSTATE [name] 01/26/2016   Procedure: TRANSURETHRAL RESECTION OF THE PROSTATE (TURP);  Surgeon: Franchot Gallo, MD;  Location: [facility];  Service: Urology;  Laterality: [name];     Home  Meds: Prior to Admission medications   Medication Sig Start Date End Date Taking? Authorizing Provider  ALPRAZolam Duanne Moron) 0.5 MG tablet Take 0.5 mg by mouth at bedtime as needed for sleep.  07/22/14  Yes Historical Provider, MD  pantoprazole (PROTONIX) 20 MG tablet Take 40 mg by mouth daily.   Yes Historical Provider, MD    Allergies:  Allergies  Allergen Reactions  . Dilaudid [Hydromorphone Hcl] Hives and Itching  . Fentanyl Itching  . Nexium [Esomeprazole Magnesium] Other (See Comments)    Difficulty urinating and passing stool; dry mouth  . Other     Thinks it was oxycodone; made him hallucinate  . Oxycodone Nausea And Vomiting  . Statins Other (See Comments)    Leg cramping    Social History   Social History  . Marital status: Married    Spouse name: [name]  . Number of children: [name]  . Years of education: [name]   Occupational History  . Not on file.   Social History Main Topics  . Smoking status: Former Smoker    Quit date: 06/07/1961  . Smokeless tobacco: Never Used  . Alcohol use No  . Drug use: No  . Sexual activity: Not Currently   Other Topics Concern  . Not on file   Social History Narrative  . No narrative on file     Family History  Problem Relation Age of Onset  . Lymphoma Mother   . Cancer Maternal Uncle   . Cancer Paternal Uncle   . Cancer Paternal Grandmother   . Stomach cancer Neg Hx   . Colon cancer Neg Hx      ROS:  Please see the history of present illness.     All other systems reviewed and negative.    Physical Exam: Blood pressure 122/72, pulse 81, height 6\' 3"  (1.905 m), weight 191 lb 6.4 oz (86.8 kg), SpO2 95 %. General: Well developed, well nourished male in no acute distress. Head: Normocephalic, atraumatic, sclera non-icteric, no xanthomas, nares are without discharge. EENT: normal  Lymph Nodes:  none Neck: Negative for carotid bruits. JVD not elevated. Canonn A waves noted Back:without scoliosis kyphosis Lungs: Clear  bilaterally to auscultation without wheezes, rales, or rhonchi. Breathing is unlabored. Heart: Irregular rate and rhythm with a normal S1 and S2   2/6 systolic murmur . No rubs, or gallops appreciated. Abdomen: Soft, non-tender, non-distended with normoactive bowel sounds. No hepatomegaly. No rebound/guarding. No obvious abdominal masses. Msk:  Strength and tone appear normal for age. Extremities: No clubbing or cyanosis. No edema.  Distal pedal pulses are 2+ and equal bilaterally. Skin: Warm and Dry Neuro: Alert and oriented X 3. CN III-XII intact Grossly normal sensory and motor function . Psych:  Responds to questions appropriately with a normal affect.      Labs: Cardiac Enzymes No results for input(s): CKTOTAL, CKMB, TROPONINI in the last 72 hours. CBC Lab Results  Component Value Date   WBC 7.7 02/11/2015   HGB 14.3 01/26/2016   HCT 36.9 (L) 02/11/2015   MCV 100.8 (H) 02/11/2015   PLT 169 02/11/2015   PROTIME: No  results for input(s): LABPROT, INR in the last 72 hours. Chemistry No results for input(s): NA, K, CL, CO2, BUN, CREATININE, CALCIUM, PROT, BILITOT, ALKPHOS, ALT, AST, GLUCOSE in the last 168 hours.  Invalid input(s): LABALBU Lipids No results found for: CHOL, HDL, LDLCALC, TRIG BNP No results found for: PROBNP Thyroid Function Tests: No results for input(s): TSH, T4TOTAL, T3FREE, THYROIDAB in the last 72 hours.  Invalid input(s): FREET3 Miscellaneous No results found for: DDIMER  Radiology/Studies:  No results found.  EKG: Sinus at 80 bpm Intervals 16/08/30 Frequent PVCs right bundle superior axis   Assessment and Plan:  PVCs-right bundle superior axis  Bicuspid aortic valve  Esophageal cancer status post esophagectomy  Dyspnea on exertion  The patient has frequent PVCs of unusual origin. They are quite discombobulated. They're concurrent with his dyspnea. Both occurred in the context of this flulike illness. The dyspnea has not been explained  by chest x-ray apparently. Potentially he could be dyspneic secondary to his PVCs. A strategy of empiric antiarrhythmic therapy was discussed with benefits and risks of proarrhythmia. We will pursue that. We will use flecainide; he is aware that it we will undertake GXT testing to look for proarrhythmia following its initiation.  I'm also concerned about other causes of dyspnea; with his history of cancer he is at risk for pulmonary embolism. I will discuss this with Dr. Brigitte Pulse tomorrow and he was not available today. This is not an acute symptom that I don't think the risks of waiting until tomorrow are excessive.  We discussed also has bicuspid aortic valve likely occurring in the wake of his rheumatic heart disease. There is no aortic stenosis. It will require ongoing surveillance Virl Axe

## 2016-06-29 ENCOUNTER — Telehealth: Payer: Self-pay | Admitting: Internal Medicine

## 2016-06-29 NOTE — Telephone Encounter (Signed)
I called and spoke with the patient- he states that he feels like his heart is beating in sync now that he is on the flecainide. I have advised him I will forward to Dr. Caryl Comes as an David [name].  I have advised him to keep his appointment for his GXT on 07/07/16. He voices understanding.

## 2016-06-29 NOTE — Telephone Encounter (Signed)
New Message    Pt states he has an appt last week about his bp and results. Dr. Caryl Comes put him on a new Rx flecainide (TAMBOCOR) 100 MG tablet for his heart to beat in sync. Pt states that his heart is now beating in sync and wanted to let someone know. Requests call back for confirmation of receiving this message

## 2016-07-06 ENCOUNTER — Telehealth: Payer: Self-pay | Admitting: Oncology

## 2016-07-06 ENCOUNTER — Ambulatory Visit (HOSPITAL_BASED_OUTPATIENT_CLINIC_OR_DEPARTMENT_OTHER): Payer: Medicare Other | Admitting: Oncology

## 2016-07-06 VITALS — BP 155/78 | HR 67 | Temp 97.7°F | Resp 18 | Ht 75.0 in | Wt 189.4 lb

## 2016-07-06 DIAGNOSIS — I10 Essential (primary) hypertension: Secondary | ICD-10-CM | POA: Diagnosis not present

## 2016-07-06 DIAGNOSIS — C155 Malignant neoplasm of lower third of esophagus: Secondary | ICD-10-CM | POA: Diagnosis not present

## 2016-07-06 NOTE — Telephone Encounter (Signed)
Appointments scheduled per 3/15 LOS. Patient given AVS report and calendars with future scheduled appointments. °

## 2016-07-06 NOTE — Progress Notes (Signed)
  [place] OFFICE PROGRESS NOTE   Diagnosis: Esophagus cancer  INTERVAL HISTORY:   Mr. Chadderdon returns as scheduled. He reports having an upper respiratory infection approximately 6 weeks ago. The cough is improving. He developed an irregular heart rate and was evaluated by Dr. Caryl Comes. He noted palpitations. He reports the heart rate has been regular since he started flat at night. Good appetite. He continues to have dysphagia with certain foods. He eats multiple times per day. He has noted mild discomfort at the left lower back for the past few weeks.  Objective:  Vital signs in last 24 hours:  Blood pressure (!) 155/78, pulse 67, temperature 97.7 F (36.5 C), temperature source Oral, resp. rate 18, height 6\' 3"  (1.905 m), weight 189 lb 6.4 oz (85.9 kg), SpO2 96 %.    HEENT: Neck without mass Lymphatics: No cervical, supraclavicular, axillary, or inguinal nodes Resp: Lungs clear bilaterally, no respiratory distress Cardio: Regular rate and rhythm, 2/6 systolic murmur GI: no hepatosplenomegaly, nontender, no mass Vascular: No leg edema Musculoskeletal: No tenderness at the left lower back/iliac region    Medications: I have reviewed the patient's current medications.  Assessment/Plan: 1. Adenocarcinoma of the distal esophagus,uT3uN2  Presenting with a food impaction and dysphagia 08/20/2014  Staging CT scans of the chest, abdomen, and pelvis 10/06/2014 with indeterminate pulmonary nodules, a 9 mm paraesophageal lymph node, and circumferential thickening of the distal esophagus  PET scan 10/23/2014 confirmed hypermetabolic thickening at the distal esophagus with small paraesophageal nodes having faint nonspecific activity. A pleural-based focus of consolidation at the posterior right lower lobe had low metabolic activity-favored to be a benign process.  Initiation of concurrent weekly Taxol/carboplatin and radiation on 11/02/2014, radiation completed  12/09/2014  CTs 12/31/2014 revealed distal esophageal wall thickening with adjacent small lymph nodes suspicious for metastases, stable lung nodule posterior to the right bronchus intermedius and the persistent 5 mm nodule in the right lower lobe  Esophagectomy 02/01/2015 with the pathology confirming a ypT3,ypN3 tumor, positive distal margin  2. Prostatic hypertrophy-Status post TUR 01/26/2016  3. Hypertension  4. History of multiple basal cell skin cancers  5. History of thrombocytopenia secondary to chemotherapy  6.  PVCs/bicuspid aortic valve-followed by cardiology     Disposition:  Mr. Dinapoli remains in clinical remission from esophagus cancer. His weight has stabilized. He will contact us if the left lower back pain persist. He will see Dr. Servando Snare in June. He will discuss the significance of the finding of a hemopericardium on the 06/14/2016 echocardiogram with Dr. Servando Snare and Dr. Caryl Comes.  He will return for an office visit in 6 months.  Betsy Coder, MD  07/06/2016  4:08 PM

## 2016-07-07 ENCOUNTER — Ambulatory Visit (INDEPENDENT_AMBULATORY_CARE_PROVIDER_SITE_OTHER): Payer: Medicare Other

## 2016-07-07 DIAGNOSIS — R0609 Other forms of dyspnea: Secondary | ICD-10-CM | POA: Diagnosis not present

## 2016-07-07 DIAGNOSIS — I493 Ventricular premature depolarization: Secondary | ICD-10-CM

## 2016-07-07 LAB — EXERCISE TOLERANCE TEST
CHL RATE OF PERCEIVED EXERTION: 17
CSEPED: 6 min
Estimated workload: 7 METS
Exercise duration (sec): 0 s
MPHR: 146 {beats}/min
Peak HR: 104 {beats}/min
Percent HR: 71 %
Rest HR: 63 {beats}/min

## 2016-07-11 NOTE — Telephone Encounter (Signed)
New Message:  Pt says he have started back having PVC's today.Please call to advise.

## 2016-07-11 NOTE — Telephone Encounter (Signed)
Patient started flecainide at last ov with Dr. Caryl Comes for frequent PVCs.  They were very bothersome.  For the first couple weeks he did not sense any PVCs.  Today, about 2 hours ago, he began noticing PVCs.  They are not as frequent as in the past, so they are not very bothersome currently.  We discussed things like not sleeping well, caffeine, alcohol, stress.  He doesn't feel like any of these are contributing factors.   He has 2 BP readings today from his wrist cuff.  156/80, 56 and 169/80, 60.  Advised patient to continue to monitor PVCs/symptoms and call tomorrow with update and an updated BP reading.   He is very appreciative for the information and will call tomorrow with how he is doing.

## 2016-07-13 DIAGNOSIS — R05 Cough: Secondary | ICD-10-CM | POA: Diagnosis not present

## 2016-07-14 ENCOUNTER — Other Ambulatory Visit: Payer: Self-pay | Admitting: Internal Medicine

## 2016-07-14 DIAGNOSIS — J90 Pleural effusion, not elsewhere classified: Secondary | ICD-10-CM

## 2016-07-14 DIAGNOSIS — R0609 Other forms of dyspnea: Secondary | ICD-10-CM

## 2016-07-14 DIAGNOSIS — Z8501 Personal history of malignant neoplasm of esophagus: Secondary | ICD-10-CM

## 2016-07-17 ENCOUNTER — Ambulatory Visit
Admission: RE | Admit: 2016-07-17 | Discharge: 2016-07-17 | Disposition: A | Discharge status: Home / Self Care | Payer: Medicare Other | Source: Ambulatory Visit | Attending: Internal Medicine | Admitting: Internal Medicine

## 2016-07-17 DIAGNOSIS — Z8501 Personal history of malignant neoplasm of esophagus: Secondary | ICD-10-CM

## 2016-07-17 DIAGNOSIS — R0609 Other forms of dyspnea: Secondary | ICD-10-CM

## 2016-07-17 DIAGNOSIS — R918 Other nonspecific abnormal finding of lung field: Secondary | ICD-10-CM | POA: Diagnosis not present

## 2016-07-17 DIAGNOSIS — J90 Pleural effusion, not elsewhere classified: Secondary | ICD-10-CM

## 2016-07-17 MED ORDER — IOPAMIDOL (ISOVUE-300) INJECTION 61%
75.0000 mL | Freq: Once | INTRAVENOUS | Status: AC | PRN
  Administered 2016-07-17: 75 mL via INTRAVENOUS

## 2016-07-18 ENCOUNTER — Telehealth: Payer: Self-pay | Admitting: *Deleted

## 2016-07-18 NOTE — Telephone Encounter (Signed)
Dr. Benay Spice discussed case with Dr. Brigitte Pulse. Called pt with appt for 3/28 @ 0900. Pt agreed to appt, voiced appreciation for call. Message to scheduler.

## 2016-07-18 NOTE — Telephone Encounter (Signed)
No call back from the patient- will forward to Dr. Caryl Comes to review.

## 2016-07-19 ENCOUNTER — Telehealth: Payer: Self-pay | Admitting: Oncology

## 2016-07-19 ENCOUNTER — Ambulatory Visit (HOSPITAL_COMMUNITY)
Admission: RE | Admit: 2016-07-19 | Discharge: 2016-07-19 | Disposition: A | Discharge status: Home / Self Care | Payer: Medicare Other | Source: Ambulatory Visit | Attending: Oncology | Admitting: Oncology

## 2016-07-19 ENCOUNTER — Telehealth: Payer: Self-pay | Admitting: *Deleted

## 2016-07-19 ENCOUNTER — Ambulatory Visit (HOSPITAL_BASED_OUTPATIENT_CLINIC_OR_DEPARTMENT_OTHER): Payer: Medicare Other | Admitting: Oncology

## 2016-07-19 ENCOUNTER — Ambulatory Visit (HOSPITAL_COMMUNITY)
Admission: RE | Admit: 2016-07-19 | Discharge: 2016-07-19 | Disposition: A | Discharge status: Home / Self Care | Payer: Medicare Other | Source: Ambulatory Visit | Attending: Physician Assistant | Admitting: Physician Assistant

## 2016-07-19 VITALS — BP 172/81 | HR 77 | Temp 97.8°F | Resp 18 | Ht 75.0 in | Wt 186.4 lb

## 2016-07-19 DIAGNOSIS — J90 Pleural effusion, not elsewhere classified: Secondary | ICD-10-CM

## 2016-07-19 DIAGNOSIS — Z85828 Personal history of other malignant neoplasm of skin: Secondary | ICD-10-CM | POA: Diagnosis not present

## 2016-07-19 DIAGNOSIS — Z9889 Other specified postprocedural states: Secondary | ICD-10-CM | POA: Insufficient documentation

## 2016-07-19 DIAGNOSIS — C155 Malignant neoplasm of lower third of esophagus: Secondary | ICD-10-CM

## 2016-07-19 DIAGNOSIS — R918 Other nonspecific abnormal finding of lung field: Secondary | ICD-10-CM

## 2016-07-19 DIAGNOSIS — K769 Liver disease, unspecified: Secondary | ICD-10-CM | POA: Diagnosis not present

## 2016-07-19 DIAGNOSIS — I10 Essential (primary) hypertension: Secondary | ICD-10-CM | POA: Diagnosis not present

## 2016-07-19 DIAGNOSIS — J9811 Atelectasis: Secondary | ICD-10-CM | POA: Diagnosis not present

## 2016-07-19 DIAGNOSIS — Z8501 Personal history of malignant neoplasm of esophagus: Secondary | ICD-10-CM | POA: Insufficient documentation

## 2016-07-19 DIAGNOSIS — R846 Abnormal cytological findings in specimens from respiratory organs and thorax: Secondary | ICD-10-CM | POA: Diagnosis not present

## 2016-07-19 LAB — BODY FLUID CELL COUNT WITH DIFFERENTIAL
Lymphs, Fluid: 76 %
MONOCYTE-MACROPHAGE-SEROUS FLUID: 13 % — AB (ref 50–90)
NEUTROPHIL FLUID: 11 % (ref 0–25)
WBC FLUID: 269 uL (ref 0–1000)

## 2016-07-19 LAB — GRAM STAIN

## 2016-07-19 MED ORDER — AMOXICILLIN-POT CLAVULANATE 500-125 MG PO TABS: 1.0000 | ORAL_TABLET | Freq: Three times a day (TID) | ORAL | 0 refills | Status: DC

## 2016-07-19 MED ORDER — AMOXICILLIN-POT CLAVULANATE 500-125 MG PO TABS: 1.0000 | ORAL_TABLET | Freq: Two times a day (BID) | ORAL | 0 refills | Status: DC

## 2016-07-19 NOTE — Progress Notes (Signed)
[place] OFFICE PROGRESS NOTE   Diagnosis: Esophagus cancer  INTERVAL HISTORY:   Mr. Fichera returns prior to a scheduled visit. He saw Dr. Brigitte Pulse for symptoms of an upper respiratory infection. A chest x-ray revealed infiltrative changes that did not clear on a repeat study. He was referred for a CT of the chest on 07/17/2016. Bilateral pleural effusions were noted. Diffuse peribronchial vascular nodularity was noted. A 2.8 cm lesion in the right liver is new compared to a CT from September 2016. The lung nodules were favored to represent inflammatory lesions, though pulmonary metastases cannot be excluded. He continues to have pain at the left chest wall. He has a nonproductive cough. He reports malaise.  He was prescribed azithromycin and Levaquin by Dr. Brigitte Pulse. He has not started these medications.  Objective:  Vital signs in last 24 hours:  Blood pressure (!) 172/81, pulse 77, temperature 97.8 F (36.6 C), temperature source Oral, resp. rate 18, height 6\' 3"  (1.905 m), weight 186 lb 6.4 oz (84.6 kg), SpO2 97 %.    HEENT: Neck without mass Resp: Decreased breath sounds at the lower chest bilaterally, no respiratory distress Cardio: Regular rate and rhythm GI: No hepatosplenomegaly, no mass, nontender Vascular: No leg edema Musculoskeletal: No chest wall tenderness    L  Imaging:  Dg Chest 1 View  Result Date: 07/19/2016 CLINICAL DATA:  Post left thoracentesis EXAM: CHEST 1 VIEW COMPARISON:  CT 07/17/2016 FINDINGS: Decreasing left pleural effusion with small residual left pleural effusion and left base atelectasis. No pneumothorax. Heart is borderline in size. No confluent opacity on the right. IMPRESSION: Decreasing left pleural effusion following thoracentesis. No pneumothorax. Continued small left effusion and left base atelectasis. Electronically Signed   By: Rolm Baptise M.D.   On: 07/19/2016 11:19   Ct Chest W Contrast  Result Date: 07/17/2016 CLINICAL DATA:   Short of breath on exertion. Back pain and dry cough. Evaluate pleural effusion. History of esophageal cancer with surgery, chemotherapy, and radiation therapy 2 years ago. EXAM: CT CHEST WITH CONTRAST TECHNIQUE: Multidetector CT imaging of the chest was performed during intravenous contrast administration. CONTRAST:  9mL ISOVUE-300 IOPAMIDOL (ISOVUE-300) INJECTION 61% COMPARISON:  12/31/2014 chest CT.  CT stone study 01/07/2016. FINDINGS: Cardiovascular: Tortuous thoracic aorta. Aortic and branch vessel atherosclerosis. Mild cardiomegaly with multivessel coronary artery atherosclerosis. No central pulmonary embolism, on this non-dedicated study. Mediastinum/Nodes: No supraclavicular adenopathy. No mediastinal or hilar adenopathy. Surgical changes of gastric pull-through and esophagectomy. Lungs/Pleura: Moderate, left larger than right pleural effusions. Bibasilar dependent pulmonary opacities. Relatively diffuse peribronchovascular nodularity. Many these nodules have a "Tree-in-bud" appearance. Some nodules are larger, including within the inferior right middle lobe at 8 mm on image 122/series 5. Upper Abdomen: Subtle hypoattenuating 2.8 cm right hepatic lobe lesion on image 138/series 2, new since 12/29/2014. Normal imaged portions of the spleen, pancreas, adrenal glands. Musculoskeletal: Moderate thoracic spondylosis. IMPRESSION: 1. Bibasilar airspace opacities and more diffuse peribronchovascular nodularity with many nodules demonstrating a "Tree-in-bud" morphology. This favors infectious bronchiolitis, including atypical etiologies. Larger pulmonary nodules are indeterminate and could be infectious as well. Concurrent pulmonary metastasis cannot be excluded. CT follow-up after antibiotic therapy suggested. 2. Left larger than right pleural effusions. 3. Right hepatic lobe low-density lesion is new since 12/31/2014, suspicious for metastatic disease. Consider dedicated abdominal imaging with pre and post  contrast abdominal MRI versus complete restaging with PET. 4. Status post esophagectomy and gastric pull through. 5.  Coronary artery atherosclerosis. Aortic atherosclerosis. Electronically Signed   By:  Abigail Miyamoto M.D.   On: 07/17/2016 12:37   US Thoracentesis Asp Pleural Space W/img Guide  Result Date: 07/19/2016 INDICATION: Shortness of breath on exertion. Back pain and dry cough. Evaluate pleural effusion. History of esophageal cancer. EXAM: ULTRASOUND GUIDED LEFT THORACENTESIS MEDICATIONS: 1% Lidocaine. COMPLICATIONS: None immediate. PROCEDURE: An ultrasound guided thoracentesis was thoroughly discussed with the patient and questions answered. The benefits, risks, alternatives and complications were also discussed. The patient understands and wishes to proceed with the procedure. Written consent was obtained. Ultrasound was performed to localize and mark an adequate pocket of fluid in the left chest. The area was then prepped and draped in the normal sterile fashion. 1% Lidocaine was used for local anesthesia. Under ultrasound guidance a 6 Fr Safe-T-Centesis catheter was introduced. Thoracentesis was performed. The catheter was removed and a dressing applied. FINDINGS: A total of approximately 1.3 liters of cloudy yellow fluid was removed. Samples were sent to the laboratory as requested by the clinical team. IMPRESSION: Successful ultrasound guided left thoracentesis yielding 1.3 liters of pleural fluid. Read by:  Gareth Eagle, PA-C Electronically Signed   By: Marybelle Killings M.D.   On: 07/19/2016 11:04    Medications: I have reviewed the patient's current medications.  Assessment/Plan: 1. Adenocarcinoma of the distal esophagus,uT3uN2  Presenting with a food impaction and dysphagia 08/20/2014  Staging CT scans of the chest, abdomen, and pelvis 10/06/2014 with indeterminate pulmonary nodules, a 9 mm paraesophageal lymph node, and circumferential thickening of the distal esophagus  PET scan  10/23/2014 confirmed hypermetabolic thickening at the distal esophagus with small paraesophageal nodes having faint nonspecific activity. A pleural-based focus of consolidation at the posterior right lower lobe had low metabolic activity-favored to be a benign process.  Initiation of concurrent weekly Taxol/carboplatin and radiation on 11/02/2014, radiation completed 12/09/2014  CTs 12/31/2014 revealed distal esophageal wall thickening with adjacent small lymph nodes suspicious for metastases, stable lung nodule posterior to the right bronchus intermedius and the persistent 5 mm nodule in the right lower lobe  Esophagectomy 02/01/2015 with the pathology confirming a ypT3,ypN3 tumor, positive distal margin  CT chest 07/17/2016-bilateral pleural effusions, Bibasilar airspace opacities with peribronchial vascular nodularity, new low-density right liver lesion  2. Prostatic hypertrophy-Status post TUR 01/26/2016  3. Hypertension  4. History of multiple basal cell skin cancers  5. History of thrombocytopenia secondary to chemotherapy  6.  PVCs/bicuspid aortic valve-followed by cardiology     Disposition:  Mr. Gasparini has a history of esophagus cancer. He has a cough, malaise, and left-sided chest discomfort. A CT of the chest 07/17/2016 reveals bilateral pleural effusions, nodular changes in the lower lobe bilaterally, and a new low-density right liver lesion.  I reviewed the CT images with Mr. Bolar and his wife. He understands the clinical and x-ray findings may be related to progression of esophagus cancer. I presented his case at the GI tumor conference this morning. Mr. Colledge will be referred for a diagnostic/therapeutic left thoracentesis today. He will return for an office visit on 07/21/2016. The plan is to proceed with a liver biopsy if the pleural fluid cytology is nondiagnostic.  Our pharmacy noted interaction between flecainide, Levaquin, and azithromycin. He will  continue flecainide and I changed the antibiotic to Augmentin.  30 minutes were spent with the patient today. The majority of the time was used for counseling and coordination of care.    Betsy Coder, MD  07/19/2016  1:21 PM

## 2016-07-19 NOTE — Procedures (Signed)
PROCEDURE SUMMARY:  Successful US guided left thoracentesis. Yielded 1.3 liters of cloudy yellow fluid. Pt tolerated procedure well. No immediate complications.  Specimen was sent for labs. CXR ordered.  Daylyn Azbill S Sueann Brownley PA-C 07/19/2016 11:05 AM

## 2016-07-19 NOTE — Telephone Encounter (Signed)
Call from pt requesting clarification of antibiotic instructions. Instructed him to take one PO BID for 5 days.

## 2016-07-19 NOTE — Telephone Encounter (Signed)
Noted interaction between Flecainide and Zithromax and Levaquin that was prescribed by PCP. Reviewed with Arbie Cookey, Oncology Pharmacist: Both are major interactions. Recommend Augmentin if appropriate. Per Dr. Benay Spice: DC antibiotic scripts. Order received for Augmentin 500 mg BID for 5 days. Pt and pharmacy notified.

## 2016-07-19 NOTE — Telephone Encounter (Signed)
Gave patient avs report and appointments for March  °

## 2016-07-20 ENCOUNTER — Telehealth: Payer: Self-pay | Admitting: *Deleted

## 2016-07-20 NOTE — Telephone Encounter (Signed)
Called pt with negative cytology result. Pt agreeable to liver lesion biopsy. He understands we will call with interventional radiology and office visit appointments.

## 2016-07-20 NOTE — Telephone Encounter (Signed)
-----   Message from Ladell Pier, MD sent at 07/20/2016  5:15 PM EDT ----- Please call patient, pleural fluid cytology is negative for cancer, recommend proceeding with biopsy of liver lesion, could reschedule appt. For after liver biopsy

## 2016-07-21 ENCOUNTER — Ambulatory Visit: Payer: Medicare Other | Admitting: Oncology

## 2016-07-21 ENCOUNTER — Other Ambulatory Visit: Payer: Self-pay | Admitting: Oncology

## 2016-07-21 DIAGNOSIS — C155 Malignant neoplasm of lower third of esophagus: Secondary | ICD-10-CM

## 2016-07-22 NOTE — Telephone Encounter (Signed)
How are the PVCs  Doing

## 2016-07-24 ENCOUNTER — Telehealth: Payer: Self-pay | Admitting: Oncology

## 2016-07-24 ENCOUNTER — Telehealth: Payer: Self-pay

## 2016-07-24 LAB — CULTURE, BODY FLUID W GRAM STAIN -BOTTLE: Culture: NO GROWTH

## 2016-07-24 LAB — CULTURE, BODY FLUID-BOTTLE

## 2016-07-24 NOTE — Telephone Encounter (Signed)
Called and spoke to scheduling department. Appointment for US Biopsy is under review in the [place] department and they will be calling patient to schedule the appointment by tomorrow.    Patient has been called back to advise of appointment status.

## 2016-07-24 NOTE — Telephone Encounter (Signed)
Pt called for missed message, he has an appt next Monday with Lattie Haw. He is asking about his US biopsy - it has not been scheduled yet.

## 2016-07-24 NOTE — Telephone Encounter (Signed)
Unable to reach patient left message with appt date and time. - scheduled per 3/30 sch message . Sent appt letter out.

## 2016-07-25 NOTE — Telephone Encounter (Signed)
I called and spoke with the patient. He states that he has not any "episodes" of PVC's aside from about a week ago - he reports an episode that lasted about 10 minutes. I have advised him at this time to continue his current dose of flecainide and to let us know if he feels like he starts to have more frequent episodes of PVC's or that they are lasting longer.   Will forward as an FYI to Dr. Caryl Comes.

## 2016-07-25 NOTE — Telephone Encounter (Signed)
Patient returning call from Heather.  

## 2016-07-25 NOTE — Telephone Encounter (Signed)
I left a message for the patient to call. 

## 2016-07-31 ENCOUNTER — Telehealth: Payer: Self-pay | Admitting: Oncology

## 2016-07-31 ENCOUNTER — Other Ambulatory Visit: Payer: Self-pay | Admitting: Radiology

## 2016-07-31 ENCOUNTER — Ambulatory Visit (HOSPITAL_COMMUNITY)
Admission: RE | Admit: 2016-07-31 | Discharge: 2016-07-31 | Disposition: A | Discharge status: Home / Self Care | Payer: Medicare Other | Source: Ambulatory Visit | Attending: Nurse Practitioner | Admitting: Nurse Practitioner

## 2016-07-31 ENCOUNTER — Encounter: Payer: Self-pay | Admitting: *Deleted

## 2016-07-31 ENCOUNTER — Ambulatory Visit (HOSPITAL_BASED_OUTPATIENT_CLINIC_OR_DEPARTMENT_OTHER): Payer: Medicare Other | Admitting: Nurse Practitioner

## 2016-07-31 VITALS — BP 160/77 | HR 69 | Temp 98.3°F | Resp 17 | Ht 75.0 in | Wt 189.5 lb

## 2016-07-31 DIAGNOSIS — Z7982 Long term (current) use of aspirin: Secondary | ICD-10-CM | POA: Diagnosis not present

## 2016-07-31 DIAGNOSIS — F419 Anxiety disorder, unspecified: Secondary | ICD-10-CM | POA: Diagnosis not present

## 2016-07-31 DIAGNOSIS — Z87891 Personal history of nicotine dependence: Secondary | ICD-10-CM | POA: Insufficient documentation

## 2016-07-31 DIAGNOSIS — R05 Cough: Secondary | ICD-10-CM | POA: Diagnosis not present

## 2016-07-31 DIAGNOSIS — Z85828 Personal history of other malignant neoplasm of skin: Secondary | ICD-10-CM | POA: Insufficient documentation

## 2016-07-31 DIAGNOSIS — K769 Liver disease, unspecified: Secondary | ICD-10-CM | POA: Diagnosis not present

## 2016-07-31 DIAGNOSIS — I10 Essential (primary) hypertension: Secondary | ICD-10-CM | POA: Diagnosis not present

## 2016-07-31 DIAGNOSIS — N400 Enlarged prostate without lower urinary tract symptoms: Secondary | ICD-10-CM | POA: Diagnosis not present

## 2016-07-31 DIAGNOSIS — C155 Malignant neoplasm of lower third of esophagus: Secondary | ICD-10-CM | POA: Diagnosis not present

## 2016-07-31 DIAGNOSIS — K219 Gastro-esophageal reflux disease without esophagitis: Secondary | ICD-10-CM | POA: Diagnosis not present

## 2016-07-31 DIAGNOSIS — J90 Pleural effusion, not elsewhere classified: Secondary | ICD-10-CM | POA: Insufficient documentation

## 2016-07-31 DIAGNOSIS — Z87442 Personal history of urinary calculi: Secondary | ICD-10-CM | POA: Insufficient documentation

## 2016-07-31 DIAGNOSIS — K2270 Barrett's esophagus without dysplasia: Secondary | ICD-10-CM | POA: Diagnosis not present

## 2016-07-31 DIAGNOSIS — I700 Atherosclerosis of aorta: Secondary | ICD-10-CM | POA: Insufficient documentation

## 2016-07-31 DIAGNOSIS — Z8501 Personal history of malignant neoplasm of esophagus: Secondary | ICD-10-CM | POA: Diagnosis not present

## 2016-07-31 DIAGNOSIS — C159 Malignant neoplasm of esophagus, unspecified: Secondary | ICD-10-CM | POA: Diagnosis present

## 2016-07-31 DIAGNOSIS — R0602 Shortness of breath: Secondary | ICD-10-CM | POA: Diagnosis not present

## 2016-07-31 NOTE — Progress Notes (Addendum)
David [name] OFFICE PROGRESS NOTE   Diagnosis:  Esophagus cancer  INTERVAL HISTORY:   David [name] returns as scheduled. He underwent a left-sided thoracentesis on 07/19/2016. The cytology was nondiagnostic of malignancy. He has been referred for biopsy of a liver lesion. He continues to have a dry cough. He has dyspnea on exertion. He wonders if the pleural fluid has reaccumulated.  Objective:  Vital signs in last 24 hours:  Blood pressure (!) 160/77, pulse 69, temperature 98.3 F (36.8 C), temperature source Oral, resp. rate 17, height 6\' 3"  (1.905 m), weight 189 lb 8 oz (86 kg), SpO2 97 %.   Resp: Breath sounds diminished at the left lower lung field. No respiratory distress. Cardio: Regular rate and rhythm. GI: Abdomen soft and nontender. No hepatomegaly. Vascular: No leg edema.   Lab Results:  Lab Results  Component Value Date   WBC 7.7 02/11/2015   HGB 14.3 01/26/2016   HCT 36.9 (L) 02/11/2015   MCV 100.8 (H) 02/11/2015   PLT 169 02/11/2015   NEUTROABS 2.5 12/30/2014    Imaging:  No results found.  Medications: I have reviewed the patient's current medications.  Assessment/Plan: 1. Adenocarcinoma of the distal esophagus,[id number]  Presenting with a food impaction and dysphagia 08/20/2014  Staging CT scans of the chest, abdomen, and pelvis 10/06/2014 with indeterminate pulmonary nodules, a 9 mm paraesophageal lymph node, and circumferential thickening of the distal esophagus  PET scan 10/23/2014 confirmed hypermetabolic thickening at the distal esophagus with small paraesophageal nodes having faint nonspecific activity. A pleural-based focus of consolidation at the posterior right lower lobe had low metabolic activity-favored to be a benign process.  Initiation of concurrent weekly Taxol/carboplatin and radiation on 11/02/2014, radiation completed 12/09/2014  CTs 12/31/2014 revealed distal esophageal wall thickening with adjacent small lymph nodes  suspicious for metastases, stable lung nodule posterior to the right bronchus intermedius and the persistent 5 mm nodule in the right lower lobe  Esophagectomy 02/01/2015 with the pathology confirming a ypT3,ypN3 tumor, positive distal margin  CT chest 07/17/2016-bilateral pleural effusions, Bibasilar airspace opacities with peribronchial vascular nodularity, new low-density right liver lesion  Left thoracentesis 07/19/2016-cytology showed reactive mesothelial cells; mixed lymphoid population  2. Prostatic hypertrophy-Status post TUR 01/26/2016  3. Hypertension  4. History of multiple basal cell skin cancers  5. History of thrombocytopenia secondary to chemotherapy  6. PVCs/bicuspid aortic valve-followed by cardiology   Disposition: David [name] appears unchanged. Cytology from the pleural fluid was nondiagnostic of malignancy. He has been referred for biopsy of a liver lesion. This is scheduled to take place on 08/01/2016. We began preliminary discussion regarding chemotherapy on the FOLFOX regimen if metastatic esophagus cancer is confirmed. We will request foundation 1 and PDL 1 testing on the tumor from the esophagectomy October 2016.  David [name] will have a chest x-ray today to follow-up the left pleural effusion.  He would like to return for a follow-up visit on 08/11/2016 for further discussion. He will contact the office in the interim with any problems.  Patient seen with Dr. Benay Spice. 25 minutes were spent face-to-face at today's visit with the majority of that time involved in counseling/coordination of care.  Ned Card ANP/GNP-BC   07/31/2016  3:27 PM  This was a shared visit with Ned Card. We discussed the likelihood of recurrent esophagus cancer despite the negative pleural fluid cytology. He will undergo a diagnostic liver biopsy tomorrow. I recommend FOLFOX chemotherapy if this confirms a diagnosis of metastatic esophagus cancer. We reviewed the potential  toxicities associated with the FOLFOX regimen. He is undecided as to whether he would agree to chemotherapy for metastatic esophagus cancer.  He will be out of town most of next week. David [name] will return for an office visit and further discussion on 08/11/2016.  Julieanne Manson, M.D.

## 2016-07-31 NOTE — Telephone Encounter (Signed)
Gave patient AVS and calender per 07/31/2016 los.  

## 2016-07-31 NOTE — Progress Notes (Signed)
Oncology Nurse Navigator Documentation  Oncology Nurse Navigator Flowsheets 07/31/2016  Navigator Location [facility]  Navigator Encounter Type Other/per Ned Card NP and Dr. Benay Spice, I contacted [facility] to send tissue obtained on 02/01/2015 for foundation one and PDL 1.    Patient Visit Type MedOnc  Treatment Phase Other  Barriers/Navigation Needs Coordination of Care  Interventions Coordination of Care  Coordination of Care Other  Acuity Level 2  Acuity Level 2 Other  Time Spent with Patient 15

## 2016-08-01 ENCOUNTER — Ambulatory Visit (HOSPITAL_COMMUNITY)
Admission: RE | Admit: 2016-08-01 | Discharge: 2016-08-01 | Disposition: A | Discharge status: Home / Self Care | Payer: Medicare Other | Source: Ambulatory Visit | Attending: Oncology | Admitting: Oncology

## 2016-08-01 ENCOUNTER — Encounter (HOSPITAL_COMMUNITY): Payer: Self-pay

## 2016-08-01 DIAGNOSIS — K7689 Other specified diseases of liver: Secondary | ICD-10-CM | POA: Diagnosis not present

## 2016-08-01 DIAGNOSIS — C159 Malignant neoplasm of esophagus, unspecified: Secondary | ICD-10-CM | POA: Diagnosis not present

## 2016-08-01 DIAGNOSIS — I10 Essential (primary) hypertension: Secondary | ICD-10-CM | POA: Diagnosis not present

## 2016-08-01 DIAGNOSIS — C169 Malignant neoplasm of stomach, unspecified: Secondary | ICD-10-CM | POA: Diagnosis not present

## 2016-08-01 DIAGNOSIS — K2270 Barrett's esophagus without dysplasia: Secondary | ICD-10-CM | POA: Diagnosis not present

## 2016-08-01 DIAGNOSIS — K219 Gastro-esophageal reflux disease without esophagitis: Secondary | ICD-10-CM | POA: Diagnosis not present

## 2016-08-01 DIAGNOSIS — Z8501 Personal history of malignant neoplasm of esophagus: Secondary | ICD-10-CM | POA: Diagnosis not present

## 2016-08-01 DIAGNOSIS — K769 Liver disease, unspecified: Secondary | ICD-10-CM | POA: Diagnosis not present

## 2016-08-01 DIAGNOSIS — J90 Pleural effusion, not elsewhere classified: Secondary | ICD-10-CM | POA: Diagnosis not present

## 2016-08-01 DIAGNOSIS — C229 Malignant neoplasm of liver, not specified as primary or secondary: Secondary | ICD-10-CM | POA: Diagnosis not present

## 2016-08-01 DIAGNOSIS — C155 Malignant neoplasm of lower third of esophagus: Secondary | ICD-10-CM

## 2016-08-01 LAB — BASIC METABOLIC PANEL
Anion gap: 6 (ref 5–15)
BUN: 19 mg/dL (ref 6–20)
CHLORIDE: 104 mmol/L (ref 101–111)
CO2: 30 mmol/L (ref 22–32)
Calcium: 8.8 mg/dL — ABNORMAL LOW (ref 8.9–10.3)
Creatinine, Ser: 0.79 mg/dL (ref 0.61–1.24)
GFR calc non Af Amer: >60 mL/min (ref >60)
Glucose, Bld: 91 mg/dL (ref 65–99)
POTASSIUM: 3.8 mmol/L (ref 3.5–5.1)
SODIUM: 140 mmol/L (ref 135–145)

## 2016-08-01 LAB — PROTIME-INR
INR: 1
PROTHROMBIN TIME: 13.2 s (ref 11.4–15.2)

## 2016-08-01 LAB — CBC WITH DIFFERENTIAL/PLATELET
Basophils Absolute: 0 10*3/uL (ref 0.0–0.1)
Basophils Relative: 0 %
Eosinophils Absolute: 0 10*3/uL (ref 0.0–0.7)
Eosinophils Relative: 1 %
HEMATOCRIT: 44 % (ref 39.0–52.0)
HEMOGLOBIN: 14.8 g/dL (ref 13.0–17.0)
LYMPHS PCT: 10 %
Lymphs Abs: 0.5 10*3/uL — ABNORMAL LOW (ref 0.7–4.0)
MCH: 32 pg (ref 26.0–34.0)
MCHC: 33.6 g/dL (ref 30.0–36.0)
MCV: 95.2 fL (ref 78.0–100.0)
MONOS PCT: 9 %
Monocytes Absolute: 0.4 10*3/uL (ref 0.1–1.0)
NEUTROS PCT: 80 %
Neutro Abs: 3.7 10*3/uL (ref 1.7–7.7)
Platelets: 181 10*3/uL (ref 150–400)
RBC: 4.62 MIL/uL (ref 4.22–5.81)
RDW: 13.3 % (ref 11.5–15.5)
WBC: 4.6 10*3/uL (ref 4.0–10.5)

## 2016-08-01 MED ORDER — FENTANYL CITRATE (PF) 100 MCG/2ML IJ SOLN
INTRAMUSCULAR | Status: AC | PRN
  Administered 2016-08-01 (×2): 25 ug via INTRAVENOUS

## 2016-08-01 MED ORDER — NALOXONE HCL 0.4 MG/ML IJ SOLN
INTRAMUSCULAR | Status: AC
  Filled 2016-08-01: qty 1

## 2016-08-01 MED ORDER — FENTANYL CITRATE (PF) 100 MCG/2ML IJ SOLN
INTRAMUSCULAR | Status: AC
  Filled 2016-08-01: qty 6

## 2016-08-01 MED ORDER — DIPHENHYDRAMINE HCL 50 MG/ML IJ SOLN
INTRAMUSCULAR | Status: AC
  Filled 2016-08-01: qty 1

## 2016-08-01 MED ORDER — MIDAZOLAM HCL 2 MG/2ML IJ SOLN
INTRAMUSCULAR | Status: AC | PRN
  Administered 2016-08-01 (×2): 1 mg via INTRAVENOUS

## 2016-08-01 MED ORDER — MIDAZOLAM HCL 2 MG/2ML IJ SOLN
INTRAMUSCULAR | Status: AC
  Filled 2016-08-01: qty 6

## 2016-08-01 MED ORDER — SODIUM CHLORIDE 0.9 % IV SOLN
INTRAVENOUS | Status: DC
  Administered 2016-08-01: 12:00:00 via INTRAVENOUS

## 2016-08-01 MED ORDER — FLUMAZENIL 0.5 MG/5ML IV SOLN
INTRAVENOUS | Status: AC
  Filled 2016-08-01: qty 5

## 2016-08-01 NOTE — Discharge Instructions (Signed)
Liver Biopsy, Care After °These instructions give you information on caring for yourself after your procedure. Your doctor may also give you more specific instructions. Call your doctor if you have any problems or questions after your procedure. °Follow these instructions at home: °· Rest at home for 1-2 days or as told by your doctor. °· Have someone stay with you for at least 24 hours. °· Do not do these things in the first 24 hours: °¨ Drive. °¨ Use machinery. °¨ Take care of other people. °¨ Sign legal documents. °¨ Take a bath or shower. °· There are many different ways to close and cover a cut (incision). For example, a cut can be closed with stitches, skin glue, or adhesive strips. Follow your doctor's instructions on: °¨ Taking care of your cut. °¨ Changing and removing your bandage (dressing). °¨ Removing whatever was used to close your cut. °· Do not drink alcohol in the first week. °· Do not lift more than 5 pounds or play contact sports for the first 2 weeks. °· Take medicines only as told by your doctor. For 1 week, do not take medicine that has aspirin in it or medicines like ibuprofen. °· Get your test results. °Contact a doctor if: °· A cut bleeds and leaves more than just a small spot of blood. °· A cut is red, puffs up (swells), or hurts more than before. °· Fluid or something else comes from a cut. °· A cut smells bad. °· You have a fever or chills. °Get help right away if: °· You have swelling, bloating, or pain in your belly (abdomen). °· You get dizzy or faint. °· You have a rash. °· You feel sick to your stomach (nauseous) or throw up (vomit). °· You have trouble breathing, feel short of breath, or feel faint. °· Your chest hurts. °· You have problems talking or seeing. °· You have trouble balancing or moving your arms or legs. °This information is not intended to replace advice given to you by your health care provider. Make sure you discuss any questions you have with your health care  provider. °Document Released: 01/18/2008 Document Revised: 09/16/2015 Document Reviewed: 06/06/2013 °Elsevier Interactive Patient Education © 2017 [employer] °Moderate Conscious Sedation, Adult, Care After °These instructions provide you with information about caring for yourself after your procedure. Your health care provider may also give you more specific instructions. Your treatment has been planned according to current medical practices, but problems sometimes occur. Call your health care provider if you have any problems or questions after your procedure. °What can I expect after the procedure? °After your procedure, it is common: °· To feel sleepy for several hours. °· To feel clumsy and have poor balance for several hours. °· To have poor judgment for several hours. °· To vomit if you eat too soon. °Follow these instructions at home: °For at least 24 hours after the procedure:  ° °· Do not: °¨ Participate in activities where you could fall or become injured. °¨ Drive. °¨ Use heavy machinery. °¨ Drink alcohol. °¨ Take sleeping pills or medicines that cause drowsiness. °¨ Make important decisions or sign legal documents. °¨ Take care of children on your own. °· Rest. °Eating and drinking  °· Follow the diet recommended by your health care provider. °· If you vomit: °¨ Drink water, juice, or soup when you can drink without vomiting. °¨ Make sure you have little or no nausea before eating solid foods. °General instructions  °· Have   a responsible adult stay with you until you are awake and alert. °· Take over-the-counter and prescription medicines only as told by your health care provider. °· If you smoke, do not smoke without supervision. °· Keep all follow-up visits as told by your health care provider. This is important. °Contact a health care provider if: °· You keep feeling nauseous or you keep vomiting. °· You feel light-headed. °· You develop a rash. °· You have a fever. °Get help right away if: °· You  have trouble breathing. °This information is not intended to replace advice given to you by your health care provider. Make sure you discuss any questions you have with your health care provider. °Document Released: 01/29/2013 Document Revised: 09/13/2015 Document Reviewed: 07/31/2015 °Elsevier Interactive Patient Education © 2017 Elsevier Inc. ° °

## 2016-08-01 NOTE — H&P (Signed)
Chief Complaint: liver lesion  Referring Physician:Dr. Izola Price. [place]  Supervising Physician: Corrie Mckusick  Patient Status: [facility] - Out-pt  HPI: David [name] is an 76 y.o. male with a history of esophageal cancer who is followed by Dr. Benay Spice presented to his PCPs office about 3 weeks ago after 3 weeks of a cough and malaise and fatigue. He had a Chest CT done which revealed "1. Bibasilar airspace opacities and more diffuse peribronchovascular nodularity with many nodules demonstrating a "Tree-in-bud" morphology. This favors infectious bronchiolitis, including atypical etiologies. Larger pulmonary nodules are indeterminate and could be infectious as well. Concurrent pulmonary metastasis cannot be excluded. CT follow-up after antibiotic therapy suggested. 2. Left larger than right pleural effusions. 3. Right hepatic lobe low-density lesion is new since 12/31/2014, suspicious for metastatic disease. Consider dedicated abdominal imaging with pre and post contrast abdominal MRI versus complete restaging with PET." He was placed on abx therapy, but has still not felt like he has improved much.  He underwent a thoracentesis which did not reveal any evidence of malignancy.  Because of the lesion noted on his CT scan in his liver and his history of esophageal cancer, a request was made for a liver lesion biopsy to determine if he has liver mets.  The patient presents today for this procedure.   Past Medical History:  Past Medical History:  Diagnosis Date  . Allergy   . Anxiety   . Arthritis   . [name]'s esophagus   . Basal cell carcinoma    nose, face, back  . Dysrhythmia   . Enlarged prostate   . Esophageal cancer ([place])   . Food impaction of esophagus 08/20/2014  . GERD (gastroesophageal reflux disease)    needs 30* elevation since total esophagectomy -uses wedge at home  . Hypertension   . Kidney stone   . Radiation   . Urinary frequency   . Wears glasses     Past  Surgical History:  Past Surgical History:  Procedure Laterality Date  . APPENDECTOMY  1961  . CERVICAL LAMINECTOMY  1980  . COLONOSCOPY     x2  . COMPLETE ESOPHAGECTOMY [name] 02/01/2015   Procedure: TRANSHIATAL TOTAL ESOPHAGECTOMY  ;  Surgeon: Grace Isaac, MD;  Location: [facility];  Service: Thoracic;  Laterality: [name];  . CYSTOSCOPY WITH STENT PLACEMENT Right 01/26/2016   Procedure: CYSTOSCOPY WITH STENT PLACEMENT;  Surgeon: Franchot Gallo, MD;  Location: [facility];  Service: Urology;  Laterality: Right;  . CYSTOSCOPY/RETROGRADE/URETEROSCOPY/STONE EXTRACTION WITH BASKET Right 01/26/2016   Procedure: CYSTOSCOPY/RETROGRADE/URETEROSCOPY/STONE EXTRACTION WITH BASKET;  Surgeon: Franchot Gallo, MD;  Location: [facility];  Service: Urology;  Laterality: Right;  . ESOPHAGOGASTRODUODENOSCOPY [name] 08/20/2014   Procedure: ESOPHAGOGASTRODUODENOSCOPY (EGD);  Surgeon: Gatha Mayer, MD;  Location[facility];  Service: Endoscopy;  Laterality: [name];  . ESOPHAGOGASTROSTOMY  02/01/2015   Procedure: CERVICAL ESOPHAGOGASTROSTOMY;  Surgeon: Grace Isaac, MD;  Location: [place];  Service: Thoracic;;  . EUS [name] 10/15/2014   Procedure: UPPER ENDOSCOPIC ULTRASOUND (EUS) LINEAR;  Surgeon: Milus Banister, MD;  Location: [facility] ENDOSCOPY;  Service: Endoscopy;  Laterality: [name];  radial linear  . HOLMIUM LASER APPLICATION Right 09/24/6946   Procedure: HOLMIUM LASER APPLICATION;  Surgeon: Franchot Gallo, MD;  Location[facility];  Service: Urology;  Laterality: Right;  . JEJUNOSTOMY  02/01/2015   Procedure: JEJUNOSTOMY;  Surgeon: Grace Isaac, MD;  Location: [place];  Service: Thoracic;;  . PYLOROMYOTOMY  02/01/2015   Procedure:  PYLOROMYOTOMY;  Surgeon: Grace Isaac, MD;  Location: [place];  Service: Thoracic;;  . TOTAL KNEE ARTHROPLASTY  2010   right  . TRANSURETHRAL RESECTION OF PROSTATE [name] 01/26/2016   Procedure: TRANSURETHRAL RESECTION OF THE PROSTATE  (TURP);  Surgeon: Franchot Gallo, MD;  Location[facility];  Service: Urology;  Laterality: [name];    Family History:  Family History  Problem Relation Age of Onset  . Lymphoma Mother   . Cancer Maternal Uncle   . Cancer Paternal Uncle   . Cancer Paternal Grandmother   . Stomach cancer Neg Hx   . Colon cancer Neg Hx     Social History:  reports that he quit smoking about 55 years ago. He has never used smokeless tobacco. He reports that he does not drink alcohol or use drugs.  Allergies:  Allergies  Allergen Reactions  . Dilaudid [Hydromorphone Hcl] Hives and Itching  . Fentanyl Itching  . Nexium [Esomeprazole Magnesium] Other (See Comments)    Difficulty urinating and passing stool; dry mouth  . Other     Thinks it was oxycodone; made him hallucinate  . Oxycodone Nausea And Vomiting  . Statins Other (See Comments)    Leg cramping    Medications: Medications reviewed in epic  Please HPI for pertinent positives, otherwise complete 10 system ROS negative, except occasional SOB, mostly with exertion.  Mallampati Score: MD Evaluation Airway: WNL Heart: WNL Abdomen: WNL Chest/ Lungs: WNL ASA  Classification: 2 Mallampati/Airway Score: Two  Physical Exam: BP (!) 166/78   Pulse 63   Temp 98.3 F (36.8 C) (Oral)   Resp 18   SpO2 97%  There is no height or weight on file to calculate BMI. General: pleasant, WD, WN white male who is laying in bed in NAD HEENT: head is normocephalic, atraumatic.  Sclera are noninjected.  PERRL.  Ears and nose without any masses or lesions.  Mouth is pink and moist Heart: regular, rate, and rhythm.  Normal s1,s2. +murmur. No obvious gallops, or rubs noted.  Palpable radial and pedal pulses bilaterally Lungs: CTAB, no wheezes, rhonchi, or rales noted.  Respiratory effort nonlabored Abd: soft, NT, ND, +BS, no masses, hernias, or organomegaly Psych: A&Ox3 with an appropriate affect.   Labs: Results for orders placed or  performed during the hospital encounter of 08/01/16 (from the past 48 hour(s))  Basic metabolic panel     Status: Abnormal   Collection Time: 08/01/16 11:25 AM  Result Value Ref Range   Sodium 140 135 - 145 mmol/L   Potassium 3.8 3.5 - 5.1 mmol/L   Chloride 104 101 - 111 mmol/L   CO2 30 22 - 32 mmol/L   Glucose, Bld 91 65 - 99 mg/dL   BUN 19 6 - 20 mg/dL   Creatinine, Ser 0.79 0.61 - 1.24 mg/dL   Calcium 8.8 (L) 8.9 - 10.3 mg/dL   GFR calc non Af Amer >60 >60 mL/min   GFR calc Af Amer >60 >60 mL/min    Comment: (NOTE) The eGFR has been calculated using the CKD EPI equation. This calculation has not been validated in all clinical situations. eGFR's persistently <60 mL/min signify possible Chronic Kidney Disease.    Anion gap 6 5 - 15  CBC with Differential/Platelet     Status: Abnormal   Collection Time: 08/01/16 11:25 AM  Result Value Ref Range   WBC 4.6 4.0 - 10.5 K/uL   RBC 4.62 4.22 - 5.81 MIL/uL   Hemoglobin 14.8 13.0 -  17.0 g/dL   HCT 44.0 39.0 - 52.0 %   MCV 95.2 78.0 - 100.0 fL   MCH 32.0 26.0 - 34.0 pg   MCHC 33.6 30.0 - 36.0 g/dL   RDW 13.3 11.5 - 15.5 %   Platelets 181 150 - 400 K/uL   Neutrophils Relative % 80 %   Neutro Abs 3.7 1.7 - 7.7 K/uL   Lymphocytes Relative 10 %   Lymphs Abs 0.5 (L) 0.7 - 4.0 K/uL   Monocytes Relative 9 %   Monocytes Absolute 0.4 0.1 - 1.0 K/uL   Eosinophils Relative 1 %   Eosinophils Absolute 0.0 0.0 - 0.7 K/uL   Basophils Relative 0 %   Basophils Absolute 0.0 0.0 - 0.1 K/uL  Protime-INR     Status: None   Collection Time: 08/01/16 11:25 AM  Result Value Ref Range   Prothrombin Time 13.2 11.4 - 15.2 seconds   INR 1.00     Imaging: Dg Chest 2 View  Result Date: 08/01/2016 CLINICAL DATA:  Follow-up left pleural effusion. History of esophageal malignancy recently in remission but may have metastatic disease now. Nonproductive cough for 2 months associated with shortness of breath. Also flu like syndrome over the past 6 weeks.  EXAM: CHEST  2 VIEW COMPARISON:  Portable chest x-ray of July 19, 2016 FINDINGS: The lungs are adequately inflated. There is a small left pleural effusion which is increased in volume since the previous study. The interstitial markings of both lungs are coarse though stable. The heart is normal in size. The pulmonary vascularity is not engorged. There is calcification in the wall of the aortic arch. The trachea is midline. There is multilevel degenerative disc disease of the thoracic spine. IMPRESSION: Interval increase in the volume of pleural fluid on the left. There is a small right pleural effusion that is fairly stable. No pulmonary parenchymal masses or alveolar infiltrates are observed. No CHF. Given the patient's history, persistent cough, and these radiographic findings, chest CT scanning would be a useful next imaging step. Thoracic aortic atherosclerosis. Electronically Signed   By: David  Martinique M.D.   On: 08/01/2016 08:09    Assessment/Plan 1. Liver lesion, history of esophageal cancer We will plan to proceed today with a liver lesion biopsy.  His labs and vitals have been reviewed. Risks and Benefits discussed with the patient including, but not limited to bleeding, infection, damage to adjacent structures or low yield requiring additional tests. All of the patient's questions were answered, patient is agreeable to proceed. Consent signed and in chart.  Thank you for this interesting consult.  I greatly enjoyed meeting KEELAN TRIPODI and look forward to participating in their care.  A copy of this report was sent to the requesting provider on this date.  Electronically Signed: Henreitta Cea 08/01/2016, 1:07 PM   I spent a total of  30 Minutes   in face to face in clinical consultation, greater than 50% of which was counseling/coordinating care for liver lesion, history of esophageal cancer

## 2016-08-01 NOTE — Procedures (Signed)
Interventional Radiology Procedure Note  Procedure: US biopsy of liver lesion.     Complications: None Recommendations:  - Ok to shower tomorrow - Do not submerge for 7 days - Routine care - diet ok - 2 hour recovery   Signed,  Dulcy Fanny. Earleen Newport, DO

## 2016-08-07 ENCOUNTER — Telehealth: Payer: Self-pay | Admitting: *Deleted

## 2016-08-07 NOTE — Telephone Encounter (Signed)
-----   Message from Ladell Pier, MD sent at 08/02/2016  7:36 PM EDT ----- Please call patient on [birth year](birthday [birth year]), liver biopsy confirms esophagus cancer as expected, f/u as scheduled to discuss chemotherapy

## 2016-08-07 NOTE — Telephone Encounter (Signed)
LM for return call- need to discuss biopsy results and confirm appt for Friday.

## 2016-08-09 ENCOUNTER — Telehealth: Payer: Self-pay | Admitting: *Deleted

## 2016-08-09 NOTE — Telephone Encounter (Signed)
Patient has been advised biopsy results. He confirms his appointment for Friday.  Patient has many questions surrounding treatment options. Immunotherapy, tumor resection, surgery and liver transplant. Advised patient to write these questions down as they come to him and his wife to bring to the appointment with his wife and other supportive family if available.   Patient thanked me for the call and again confirmed appointment on Friday. 

## 2016-08-11 ENCOUNTER — Telehealth: Payer: Self-pay | Admitting: *Deleted

## 2016-08-11 ENCOUNTER — Ambulatory Visit (HOSPITAL_BASED_OUTPATIENT_CLINIC_OR_DEPARTMENT_OTHER): Payer: Medicare Other | Admitting: Oncology

## 2016-08-11 ENCOUNTER — Telehealth: Payer: Self-pay | Admitting: Oncology

## 2016-08-11 VITALS — BP 155/79 | HR 67 | Temp 98.1°F | Resp 16 | Ht 75.0 in | Wt 184.7 lb

## 2016-08-11 DIAGNOSIS — C155 Malignant neoplasm of lower third of esophagus: Secondary | ICD-10-CM | POA: Diagnosis not present

## 2016-08-11 DIAGNOSIS — I10 Essential (primary) hypertension: Secondary | ICD-10-CM

## 2016-08-11 DIAGNOSIS — C7800 Secondary malignant neoplasm of unspecified lung: Secondary | ICD-10-CM

## 2016-08-11 DIAGNOSIS — C787 Secondary malignant neoplasm of liver and intrahepatic bile duct: Secondary | ICD-10-CM | POA: Diagnosis not present

## 2016-08-11 NOTE — Telephone Encounter (Signed)
Gave patient avs report and appointments for May. Referral message to HIM re [facility] referral. Patient aware he will be contacted re appointment with [facility].

## 2016-08-11 NOTE — Telephone Encounter (Signed)
Patient notified per order of Dr. Benay Spice that PDL-1 is negative and is unlikely to respond to immunotherapy.  Patient appreciative of call and has no questions at this time.

## 2016-08-11 NOTE — Progress Notes (Signed)
  [place] OFFICE PROGRESS NOTE   Diagnosis:  Esophagus cancer  INTERVAL HISTORY:   David [name] returns as scheduled. He continues to have a nonproductive cough. He was able to take a trip last week. He has early satiety, but is able to eat multiple times per day. He underwent an ultrasound-guided biopsy of a liver lesion 08/01/2016 and the pathology confirmed adenocarcinoma.  Objective:  Vital signs in last 24 hours:  Blood pressure (!) 155/79, pulse 67, temperature 98.1 F (36.7 C), temperature source Oral, resp. rate 16, height _0  (1.905 m), weight 184 lb 11.2 oz (83.8 kg), SpO2 95 %.    Resp: Decreased breath sounds at the posterior bases bilaterally, no respiratory distress Cardio: Regular rate and rhythm GI: No hepatosplenomegaly, nontender Vascular: No leg edema      Lab Results:  Lab Results  Component Value Date   WBC 4.6 08/01/2016   HGB 14.8 08/01/2016   HCT 44.0 08/01/2016   MCV 95.2 08/01/2016   PLT 181 08/01/2016   NEUTROABS 3.7 08/01/2016     Medications: I have reviewed the patient's current medications.  Assessment/Plan: 1. Adenocarcinoma of the distal esophagus,uT3uN2  Presenting with a food impaction and dysphagia 08/20/2014  Staging CT scans of the chest, abdomen, and pelvis 10/06/2014 with indeterminate pulmonary nodules, a 9 mm paraesophageal lymph node, and circumferential thickening of the distal esophagus  PET scan 10/23/2014 confirmed hypermetabolic thickening at the distal esophagus with small paraesophageal nodes having faint nonspecific activity. A pleural-based focus of consolidation at the posterior right lower lobe had low metabolic activity-favored to be a benign process.  Initiation of concurrent weekly Taxol/carboplatin and radiation on 11/02/2014, radiation completed 12/09/2014  CTs 12/31/2014 revealed distal esophageal wall thickening with adjacent small lymph nodes suspicious for metastases, stable lung nodule  posterior to the right bronchus intermedius and the persistent 5 mm nodule in the right lower lobe  Esophagectomy 02/01/2015 with the pathology confirming a ypT3,ypN3 tumor, positive distal margin  CT chest 07/17/2016-bilateral pleural effusions, Bibasilar airspace opacities with peribronchial vascular nodularity, new low-density right liver lesion  Left thoracentesis 07/19/2016-cytology showed reactive mesothelial cells; mixed lymphoid population  Ultrasound-guided biopsy of a segment 8 liver lesion on 08/01/2016-adenocarcinoma  2. Prostatic hypertrophy-Status post TUR 01/26/2016  3. Hypertension  4. History of multiple basal cell skin cancers  5. History of thrombocytopenia secondary to chemotherapy  6. PVCs/bicuspid aortic valve-followed by cardiology    Disposition:  David [name] appears unchanged. He has been diagnosed with metastatic esophagus cancer. I suspect the nodular opacities in the lungs represent metastatic disease. We reviewed the most recent chest x-ray that confirms persistent small bilateral pleural effusions.  I discussed treatment options with David [name] and his wife. I recommend FOLFOX chemotherapy. He is unsure whether he wants to proceed with chemotherapy. He requests a second opinion. We will make a referral to Dr.Uronis at [facility].  We submitted the original tumor for PDL-1 and Foundation 1 testing when he was here On 07/31/2016. This testing is in process.  We discussed the potential for treatment with HER-2 directed therapy and immunotherapy based on the molecular testing.  David [name] will return for an office visit and further discussion on 08/24/2016.  25 minutes were spent with the patient today. The majority of the time was used for counseling and coordination of care.  Betsy Coder, MD  08/11/2016  8:58 AM

## 2016-08-15 ENCOUNTER — Encounter: Payer: Self-pay | Admitting: Oncology

## 2016-08-15 ENCOUNTER — Encounter: Payer: Self-pay | Admitting: Nurse Practitioner

## 2016-08-15 ENCOUNTER — Telehealth: Payer: Self-pay | Admitting: *Deleted

## 2016-08-15 ENCOUNTER — Other Ambulatory Visit: Payer: Self-pay | Admitting: *Deleted

## 2016-08-15 ENCOUNTER — Telehealth: Payer: Self-pay | Admitting: Oncology

## 2016-08-15 MED ORDER — ONDANSETRON HCL 8 MG PO TABS: 8.0000 mg | ORAL_TABLET | Freq: Three times a day (TID) | ORAL | 2 refills | Status: DC | PRN

## 2016-08-15 MED ORDER — PANTOPRAZOLE SODIUM 20 MG PO TBEC: 20.0000 mg | DELAYED_RELEASE_TABLET | Freq: Two times a day (BID) | ORAL | 0 refills | Status: DC

## 2016-08-15 NOTE — Telephone Encounter (Signed)
Pt appt with Dr. Fanny Skates is 08/21/16@8 :00 Pt is aware

## 2016-08-15 NOTE — Telephone Encounter (Signed)
Left message for pt to call office. Per Dr. Benay Spice: David [name] testing confirms he is not a good candidate for immunotherapy. Her-2 is not amplified which means he is not a Herceptin candidate. Dr. Benay Spice recommends FOLFOX. Follow up as scheduled.

## 2016-08-15 NOTE — Progress Notes (Signed)
Received rejection message for Ondansetron from [facility]. Called [phone] for B vs D determination and spoke with Crystal. Crystal asked a few questions and then joined [place] the pharmacist on the line whom asked one question. He approved through the end of year 04/22/16-04/23/17. Approval will be sent via fax as well.  Called [phone] to advise. She states it went through.

## 2016-08-15 NOTE — Telephone Encounter (Addendum)
Spoke with pt, Foundation1 test result given, per Dr. Gearldine Shown note below. He voiced understanding. Pt is scheduled to see Dr. Aleatha Borer 08/21/16. We discussed scheduling a tentative chemo appt for the week of 5/7. He will be out of town 5/4-5/7. Pt requests refill of pantoprazole.  Refill e-scribed, per Dr. Benay Spice.

## 2016-08-16 ENCOUNTER — Telehealth: Payer: Self-pay | Admitting: *Deleted

## 2016-08-16 NOTE — Telephone Encounter (Signed)
Call received from patient stating that he has an appt with Dr. Fanny Skates at [facility] on Monday, 08/21/16 and would like appt with Dr. Benay Spice after that.  Call placed back to patient to inform him that his current scheduled appt is on 08/24/16 and will be after his appt with Dr. Fanny Skates.  Patient appreciative of call back and clarification of his schedule.  Patient has no further questions at this time.

## 2016-08-17 DIAGNOSIS — C160 Malignant neoplasm of cardia: Secondary | ICD-10-CM | POA: Diagnosis not present

## 2016-08-17 DIAGNOSIS — C787 Secondary malignant neoplasm of liver and intrahepatic bile duct: Secondary | ICD-10-CM | POA: Diagnosis not present

## 2016-08-21 DIAGNOSIS — C155 Malignant neoplasm of lower third of esophagus: Secondary | ICD-10-CM | POA: Diagnosis not present

## 2016-08-24 ENCOUNTER — Ambulatory Visit (HOSPITAL_BASED_OUTPATIENT_CLINIC_OR_DEPARTMENT_OTHER): Payer: Medicare Other | Admitting: Nurse Practitioner

## 2016-08-24 ENCOUNTER — Telehealth: Payer: Self-pay | Admitting: Oncology

## 2016-08-24 VITALS — BP 155/83 | HR 79 | Temp 98.4°F | Resp 18 | Ht 75.0 in | Wt 184.9 lb

## 2016-08-24 DIAGNOSIS — R5383 Other fatigue: Secondary | ICD-10-CM

## 2016-08-24 DIAGNOSIS — I10 Essential (primary) hypertension: Secondary | ICD-10-CM | POA: Diagnosis not present

## 2016-08-24 DIAGNOSIS — Z9189 Other specified personal risk factors, not elsewhere classified: Secondary | ICD-10-CM

## 2016-08-24 DIAGNOSIS — C155 Malignant neoplasm of lower third of esophagus: Secondary | ICD-10-CM | POA: Diagnosis not present

## 2016-08-24 NOTE — Progress Notes (Addendum)
David [name] OFFICE PROGRESS NOTE   Diagnosis:  Esophagus cancer  INTERVAL HISTORY:   David [name] returns as scheduled. His main complaint is fatigue. He reports a good appetite. His weight is stable. He has mild intermittent low back pain. He has occasional nausea. Bowels move 1 time a day. He has a "sore" on the right upper buttock. He has mild dyspnea on exertion, periodic cough.   Objective:  Vital signs in last 24 hours:  Blood pressure (!) 155/83, pulse 79, temperature 98.4 F (36.9 C), temperature source Oral, resp. rate 18, height 6\' 3"  (1.905 m), weight 184 lb 14.4 oz (83.9 kg), SpO2 96 %.    HEENT: No thrush or ulcers. Resp: Lungs clear bilaterally. Breath sounds diminished at bilateral bases. Cardio: Regular rate and rhythm. GI: No hepatomegaly. Vascular: No leg edema. Skin: Sacrum with mild erythema. Small superficial scabbed lesion at the right upper medial buttock/gluteal fold.    Lab Results:  Lab Results  Component Value Date   WBC 4.6 08/01/2016   HGB 14.8 08/01/2016   HCT 44.0 08/01/2016   MCV 95.2 08/01/2016   PLT 181 08/01/2016   NEUTROABS 3.7 08/01/2016    Imaging:  No results found.  Medications: I have reviewed the patient's current medications.  Assessment/Plan: 1. Adenocarcinoma of the distal esophagus,uT3uN2  Presenting with a food impaction and dysphagia 08/20/2014  Staging CT scans of the chest, abdomen, and pelvis 10/06/2014 with indeterminate pulmonary nodules, a 9 mm paraesophageal lymph node, and circumferential thickening of the distal esophagus  PET scan 10/23/2014 confirmed hypermetabolic thickening at the distal esophagus with small paraesophageal nodes having faint nonspecific activity. A pleural-based focus of consolidation at the posterior right lower lobe had low metabolic activity-favored to be a benign process.  Initiation of concurrent weekly Taxol/carboplatin and radiation on 11/02/2014, radiation completed  12/09/2014  CTs 12/31/2014 revealed distal esophageal wall thickening with adjacent small lymph nodes suspicious for metastases, stable lung nodule posterior to the right bronchus intermedius and the persistent 5 mm nodule in the right lower lobe  Esophagectomy 02/01/2015 with the pathology confirming a ypT3,ypN3 tumor, positive distal margin  CT chest 07/17/2016-bilateral pleural effusions, Bibasilar airspace opacities with peribronchial vascular nodularity, new low-density right liver lesion  Left thoracentesis 07/19/2016-cytology showedreactive mesothelial cells; mixed lymphoid population  Ultrasound-guided biopsy of a segment 8 liver lesion on 08/01/2016-adenocarcinoma  2. Prostatic hypertrophy-Status post TUR 01/26/2016  3. Hypertension  4. History of multiple basal cell skin cancers  5. History of thrombocytopenia secondary to chemotherapy  6. PVCs/bicuspid aortic valve-followed by cardiology   Disposition: David [name] appears stable. He has decided to proceed with treatment on study at [place] with Dr. Fanny Skates. If he would like to have a Port-A-Cath placed he will let us know and we will refer to interventional radiology at [facility]. We scheduled a return visit here in 2 months. He will contact the office in the interim with any problems.  He has a superficial pressure ulcer at the right medial buttock. He is interested in a hospital bed. We will contact a home care agency regarding this.  Patient seen with Dr. Benay Spice.  Ned Card ANP/GNP-BC   08/24/2016  2:09 PM This was a shared visit with Ned Card. David [name] plans to enroll on a clinical trial at [facility]. We we will arrange for placement of a Port-A-Cath as needed. He will return for an office visit in 2 months. We are available to see him in the interim as needed. Leroy Sea  Benay Spice, M.D.

## 2016-08-24 NOTE — Progress Notes (Signed)
    Durable Medical Equipment        Start     Ordered   08/24/16 0000  DME Hospital bed    Question Answer Comment  The above medical condition requires: Patient requires the ability to reposition frequently   Bed type Semi-electric   Support Surface: Gel Overlay      08/24/16 1429

## 2016-08-24 NOTE — Telephone Encounter (Signed)
Appointments scheduled per 08/24/16 los.  °Patient was given a copy of the AVS report and appointment schedule per 08/24/16 los. °

## 2016-08-25 ENCOUNTER — Other Ambulatory Visit: Payer: Self-pay | Admitting: *Deleted

## 2016-08-25 DIAGNOSIS — C155 Malignant neoplasm of lower third of esophagus: Secondary | ICD-10-CM

## 2016-08-28 ENCOUNTER — Encounter (HOSPITAL_COMMUNITY): Payer: Self-pay

## 2016-08-29 ENCOUNTER — Telehealth: Payer: Self-pay | Admitting: *Deleted

## 2016-08-29 ENCOUNTER — Other Ambulatory Visit: Payer: Self-pay | Admitting: Oncology

## 2016-08-29 DIAGNOSIS — C155 Malignant neoplasm of lower third of esophagus: Secondary | ICD-10-CM

## 2016-08-29 NOTE — Telephone Encounter (Signed)
Spoke with pt, he reports he is not eligible for clinical trial due to his cardiac history. Pt is requesting to be set up for FOLFOX. Will review with Dr. Benay Spice.

## 2016-08-31 ENCOUNTER — Telehealth: Payer: Self-pay | Admitting: Oncology

## 2016-08-31 NOTE — Telephone Encounter (Signed)
lvm to inform pt of 5/24 appt per sch msg

## 2016-09-04 ENCOUNTER — Other Ambulatory Visit: Payer: Self-pay | Admitting: Radiology

## 2016-09-04 NOTE — Telephone Encounter (Signed)
Port placement is scheduled for 4/16, office and chemo scheduled for 4/24 due to infusion room availability. Pt is concerned about waiting until 4/24 for treatment. Concerned he may be "losing ground." Pt reports general feeling of illness. Has intermittent nausea and  feels as though he can't focus. He also asks if he should have imaging prior to treatment since it has been almost 2 months since scan. Will review with MD for recommendations.

## 2016-09-05 ENCOUNTER — Telehealth: Payer: Self-pay | Admitting: *Deleted

## 2016-09-05 ENCOUNTER — Other Ambulatory Visit: Payer: Self-pay | Admitting: Radiology

## 2016-09-05 ENCOUNTER — Other Ambulatory Visit: Payer: Self-pay | Admitting: Student

## 2016-09-05 ENCOUNTER — Telehealth: Payer: Self-pay

## 2016-09-05 NOTE — Telephone Encounter (Signed)
[facility] was going to deliver hospital bed. He went to the store and did not like the bed b/c he is tall and feels it will not be suitable for him. Pt did call medicare and they told him the MD would have to write a new Rx. Pt went to mattress store, [employer], and found a bed he likes where head and feet elevate. He is also concerned about his neck issues. He is wanting to make sure the rx does not go to [facility] again.   He will be at [facility] tomorrow for port placement and could be able to pick it up.

## 2016-09-06 ENCOUNTER — Encounter (HOSPITAL_COMMUNITY): Payer: Self-pay

## 2016-09-06 ENCOUNTER — Ambulatory Visit (HOSPITAL_COMMUNITY)
Admission: RE | Admit: 2016-09-06 | Discharge: 2016-09-06 | Disposition: A | Discharge status: Home / Self Care | Payer: Medicare Other | Source: Ambulatory Visit | Attending: Oncology | Admitting: Oncology

## 2016-09-06 ENCOUNTER — Ambulatory Visit (HOSPITAL_BASED_OUTPATIENT_CLINIC_OR_DEPARTMENT_OTHER): Payer: Medicare Other

## 2016-09-06 ENCOUNTER — Other Ambulatory Visit: Payer: Self-pay | Admitting: Oncology

## 2016-09-06 ENCOUNTER — Telehealth: Payer: Self-pay | Admitting: *Deleted

## 2016-09-06 ENCOUNTER — Ambulatory Visit (HOSPITAL_BASED_OUTPATIENT_CLINIC_OR_DEPARTMENT_OTHER): Payer: Medicare Other | Admitting: Oncology

## 2016-09-06 ENCOUNTER — Telehealth: Payer: Self-pay | Admitting: Oncology

## 2016-09-06 VITALS — BP 128/74 | HR 65 | Temp 97.8°F | Resp 18 | Wt 179.5 lb

## 2016-09-06 DIAGNOSIS — N400 Enlarged prostate without lower urinary tract symptoms: Secondary | ICD-10-CM | POA: Diagnosis not present

## 2016-09-06 DIAGNOSIS — C155 Malignant neoplasm of lower third of esophagus: Secondary | ICD-10-CM

## 2016-09-06 DIAGNOSIS — I10 Essential (primary) hypertension: Secondary | ICD-10-CM | POA: Diagnosis not present

## 2016-09-06 DIAGNOSIS — Z7189 Other specified counseling: Secondary | ICD-10-CM | POA: Insufficient documentation

## 2016-09-06 DIAGNOSIS — Z5111 Encounter for antineoplastic chemotherapy: Secondary | ICD-10-CM | POA: Diagnosis present

## 2016-09-06 DIAGNOSIS — F419 Anxiety disorder, unspecified: Secondary | ICD-10-CM | POA: Diagnosis not present

## 2016-09-06 DIAGNOSIS — C787 Secondary malignant neoplasm of liver and intrahepatic bile duct: Secondary | ICD-10-CM | POA: Diagnosis not present

## 2016-09-06 DIAGNOSIS — C159 Malignant neoplasm of esophagus, unspecified: Secondary | ICD-10-CM | POA: Diagnosis not present

## 2016-09-06 DIAGNOSIS — R05 Cough: Secondary | ICD-10-CM

## 2016-09-06 DIAGNOSIS — K219 Gastro-esophageal reflux disease without esophagitis: Secondary | ICD-10-CM | POA: Diagnosis not present

## 2016-09-06 HISTORY — PX: IR FLUORO GUIDE PORT INSERTION RIGHT: IMG5741

## 2016-09-06 HISTORY — PX: IR US GUIDE VASC ACCESS RIGHT: IMG2390

## 2016-09-06 LAB — BASIC METABOLIC PANEL
Anion gap: 8 (ref 5–15)
BUN: 19 mg/dL (ref 6–20)
CO2: 29 mmol/L (ref 22–32)
CREATININE: 0.76 mg/dL (ref 0.61–1.24)
Calcium: 8.9 mg/dL (ref 8.9–10.3)
Chloride: 102 mmol/L (ref 101–111)
GFR calc Af Amer: >60 mL/min (ref >60)
GFR calc non Af Amer: >60 mL/min (ref >60)
Glucose, Bld: 98 mg/dL (ref 65–99)
Potassium: 3.8 mmol/L (ref 3.5–5.1)
SODIUM: 139 mmol/L (ref 135–145)

## 2016-09-06 LAB — CBC WITH DIFFERENTIAL/PLATELET
Basophils Absolute: 0 10*3/uL (ref 0.0–0.1)
Basophils Relative: 0 %
EOS PCT: 1 %
Eosinophils Absolute: 0.1 10*3/uL (ref 0.0–0.7)
HCT: 45.3 % (ref 39.0–52.0)
HEMOGLOBIN: 15.2 g/dL (ref 13.0–17.0)
LYMPHS ABS: 0.4 10*3/uL — AB (ref 0.7–4.0)
Lymphocytes Relative: 8 %
MCH: 32.7 pg (ref 26.0–34.0)
MCHC: 33.6 g/dL (ref 30.0–36.0)
MCV: 97.4 fL (ref 78.0–100.0)
MONOS PCT: 10 %
Monocytes Absolute: 0.5 10*3/uL (ref 0.1–1.0)
Neutro Abs: 4.2 10*3/uL (ref 1.7–7.7)
Neutrophils Relative %: 81 %
PLATELETS: 192 10*3/uL (ref 150–400)
RBC: 4.65 MIL/uL (ref 4.22–5.81)
RDW: 13.4 % (ref 11.5–15.5)
WBC: 5.2 10*3/uL (ref 4.0–10.5)

## 2016-09-06 LAB — HEPATIC FUNCTION PANEL
ALBUMIN: 3.5 g/dL (ref 3.5–5.0)
ALT: 18 U/L (ref 17–63)
AST: 23 U/L (ref 15–41)
Alkaline Phosphatase: 83 U/L (ref 38–126)
BILIRUBIN DIRECT: 0.1 mg/dL (ref 0.1–0.5)
BILIRUBIN INDIRECT: 0.6 mg/dL (ref 0.3–0.9)
BILIRUBIN TOTAL: 0.7 mg/dL (ref 0.3–1.2)
Total Protein: 6.8 g/dL (ref 6.5–8.1)

## 2016-09-06 LAB — PROTIME-INR
INR: 1.08
PROTHROMBIN TIME: 14 s (ref 11.4–15.2)

## 2016-09-06 MED ORDER — PALONOSETRON HCL INJECTION 0.25 MG/5ML
INTRAVENOUS | Status: AC
  Filled 2016-09-06: qty 5

## 2016-09-06 MED ORDER — CEFAZOLIN SODIUM-DEXTROSE 2-4 GM/100ML-% IV SOLN
2.0000 g | INTRAVENOUS | Status: AC
  Administered 2016-09-06: 2 g via INTRAVENOUS

## 2016-09-06 MED ORDER — FLUOROURACIL CHEMO INJECTION 2.5 GM/50ML
400.0000 mg/m2 | Freq: Once | INTRAVENOUS | Status: AC
  Administered 2016-09-06: 800 mg via INTRAVENOUS
  Filled 2016-09-06: qty 16

## 2016-09-06 MED ORDER — DEXTROSE 5 % IV SOLN
Freq: Once | INTRAVENOUS | Status: AC
  Administered 2016-09-06: 16:00:00 via INTRAVENOUS

## 2016-09-06 MED ORDER — MIDAZOLAM HCL 2 MG/2ML IJ SOLN
INTRAMUSCULAR | Status: AC
  Filled 2016-09-06: qty 6

## 2016-09-06 MED ORDER — NALOXONE HCL 0.4 MG/ML IJ SOLN
INTRAMUSCULAR | Status: AC
  Filled 2016-09-06: qty 1

## 2016-09-06 MED ORDER — ONDANSETRON HCL 4 MG/2ML IJ SOLN
INTRAMUSCULAR | Status: AC | PRN
  Administered 2016-09-06: 4 mg via INTRAVENOUS

## 2016-09-06 MED ORDER — LIDOCAINE-EPINEPHRINE (PF) 2 %-1:200000 IJ SOLN
INTRAMUSCULAR | Status: AC
  Filled 2016-09-06: qty 20

## 2016-09-06 MED ORDER — SODIUM CHLORIDE 0.9 % IV SOLN
2425.0000 mg/m2 | INTRAVENOUS | Status: DC
  Administered 2016-09-06: 5000 mg via INTRAVENOUS
  Filled 2016-09-06: qty 100

## 2016-09-06 MED ORDER — PALONOSETRON HCL INJECTION 0.25 MG/5ML
0.2500 mg | Freq: Once | INTRAVENOUS | Status: AC
  Administered 2016-09-06: 0.25 mg via INTRAVENOUS

## 2016-09-06 MED ORDER — DEXAMETHASONE SODIUM PHOSPHATE 10 MG/ML IJ SOLN
10.0000 mg | Freq: Once | INTRAMUSCULAR | Status: AC
  Administered 2016-09-06: 10 mg via INTRAVENOUS

## 2016-09-06 MED ORDER — FENTANYL CITRATE (PF) 100 MCG/2ML IJ SOLN
INTRAMUSCULAR | Status: AC
  Filled 2016-09-06: qty 2

## 2016-09-06 MED ORDER — LEUCOVORIN CALCIUM INJECTION 350 MG
400.0000 mg/m2 | Freq: Once | INTRAVENOUS | Status: AC
  Administered 2016-09-06: 824 mg via INTRAVENOUS
  Filled 2016-09-06: qty 25

## 2016-09-06 MED ORDER — HEPARIN SOD (PORK) LOCK FLUSH 100 UNIT/ML IV SOLN
INTRAVENOUS | Status: AC
Start: 2016-09-06 — End: 2016-09-06
  Filled 2016-09-06: qty 5

## 2016-09-06 MED ORDER — CEFAZOLIN SODIUM-DEXTROSE 2-4 GM/100ML-% IV SOLN
INTRAVENOUS | Status: AC
  Administered 2016-09-06: 2 g via INTRAVENOUS
  Filled 2016-09-06: qty 100

## 2016-09-06 MED ORDER — HEPARIN SOD (PORK) LOCK FLUSH 100 UNIT/ML IV SOLN
INTRAVENOUS | Status: AC | PRN
  Administered 2016-09-06: 500 [IU]

## 2016-09-06 MED ORDER — LIDOCAINE-EPINEPHRINE (PF) 2 %-1:200000 IJ SOLN
INTRAMUSCULAR | Status: AC | PRN
  Administered 2016-09-06: 20 mL

## 2016-09-06 MED ORDER — OXALIPLATIN CHEMO INJECTION 100 MG/20ML
85.0000 mg/m2 | Freq: Once | INTRAVENOUS | Status: AC
  Administered 2016-09-06: 175 mg via INTRAVENOUS
  Filled 2016-09-06: qty 35

## 2016-09-06 MED ORDER — FLUMAZENIL 0.5 MG/5ML IV SOLN
INTRAVENOUS | Status: DC
Start: 2016-09-06 — End: 2016-09-06
  Filled 2016-09-06: qty 5

## 2016-09-06 MED ORDER — ONDANSETRON HCL 4 MG/2ML IJ SOLN
INTRAMUSCULAR | Status: AC
  Filled 2016-09-06: qty 2

## 2016-09-06 MED ORDER — DEXAMETHASONE SODIUM PHOSPHATE 10 MG/ML IJ SOLN
INTRAMUSCULAR | Status: AC
  Filled 2016-09-06: qty 1

## 2016-09-06 MED ORDER — SODIUM CHLORIDE 0.9 % IV SOLN: 10.0000 mg | Freq: Once | INTRAVENOUS | Status: DC

## 2016-09-06 MED ORDER — FENTANYL CITRATE (PF) 100 MCG/2ML IJ SOLN
INTRAMUSCULAR | Status: AC | PRN
  Administered 2016-09-06: 50 ug via INTRAVENOUS
  Administered 2016-09-06 (×2): 25 ug via INTRAVENOUS

## 2016-09-06 MED ORDER — SODIUM CHLORIDE 0.9 % IV SOLN
INTRAVENOUS | Status: DC
  Administered 2016-09-06: 10:00:00 via INTRAVENOUS

## 2016-09-06 MED ORDER — MIDAZOLAM HCL 2 MG/2ML IJ SOLN
INTRAMUSCULAR | Status: AC | PRN
  Administered 2016-09-06 (×2): 1 mg via INTRAVENOUS

## 2016-09-06 NOTE — H&P (Signed)
Referring Physician(s): Ladell Pier  Supervising Physician: Daryll Brod  Patient Status:  [facility] OP  Chief Complaint:  "I'm here to get a port a cath"  Subjective: Patient familiar to IR service from prior thoracentesis in March of this year as well as liver lesion biopsy on 08/01/16. He has a history of metastatic esophageal carcinoma and presents today for Port-A-Cath placement for chemotherapy. He currently denies fever, headache, worsening chest pain, abdominal pain, vomiting or abnormal bleeding. He does have some dyspnea with exertion, fatigue, acid reflux, occasional cough, back pain and intermittent nausea. Additional history as below. Past Medical History:  Diagnosis Date  . Allergy   . Anxiety   . Arthritis   . [name]'s esophagus   . Basal cell carcinoma    nose, face, back  . Dysrhythmia   . Enlarged prostate   . Esophageal cancer ([place])   . Food impaction of esophagus 08/20/2014  . GERD (gastroesophageal reflux disease)    needs 30* elevation since total esophagectomy -uses wedge at home  . Hypertension   . Kidney stone   . Radiation   . Urinary frequency   . Wears glasses    Past Surgical History:  Procedure Laterality Date  . APPENDECTOMY  1961  . CERVICAL LAMINECTOMY  1980  . COLONOSCOPY     x2  . COMPLETE ESOPHAGECTOMY [name] 02/01/2015   Procedure: TRANSHIATAL TOTAL ESOPHAGECTOMY  ;  Surgeon: Grace Isaac, MD;  Location: [place];  Service: Thoracic;  Laterality: [name];  . CYSTOSCOPY WITH STENT PLACEMENT Right 01/26/2016   Procedure: CYSTOSCOPY WITH STENT PLACEMENT;  Surgeon: Franchot Gallo, MD;  Location: [facility];  Service: Urology;  Laterality: Right;  . CYSTOSCOPY/RETROGRADE/URETEROSCOPY/STONE EXTRACTION WITH BASKET Right 01/26/2016   Procedure: CYSTOSCOPY/RETROGRADE/URETEROSCOPY/STONE EXTRACTION WITH BASKET;  Surgeon: Franchot Gallo, MD;  Location: [facility];  Service: Urology;  Laterality: Right;  .  ESOPHAGOGASTRODUODENOSCOPY [name] 08/20/2014   Procedure: ESOPHAGOGASTRODUODENOSCOPY (EGD);  Surgeon: Gatha Mayer, MD;  Location: [facility] ENDOSCOPY;  Service: Endoscopy;  Laterality: [name];  . ESOPHAGOGASTROSTOMY  02/01/2015   Procedure: CERVICAL ESOPHAGOGASTROSTOMY;  Surgeon: Grace Isaac, MD;  Location: [place];  Service: Thoracic;;  . EUS [name] 10/15/2014   Procedure: UPPER ENDOSCOPIC ULTRASOUND (EUS) LINEAR;  Surgeon: Milus Banister, MD;  Location: [facility] ENDOSCOPY;  Service: Endoscopy;  Laterality: [name];  radial linear  . HOLMIUM LASER APPLICATION Right 09/24/6946   Procedure: HOLMIUM LASER APPLICATION;  Surgeon: Franchot Gallo, MD;  Location: [facility];  Service: Urology;  Laterality: Right;  . JEJUNOSTOMY  02/01/2015   Procedure: JEJUNOSTOMY;  Surgeon: Grace Isaac, MD;  Location: [place];  Service: Thoracic;;  . Edwena Blow  02/01/2015   Procedure: Edwena Blow;  Surgeon: Grace Isaac, MD;  Location: [place];  Service: Thoracic;;  . TOTAL KNEE ARTHROPLASTY  2010   right  . TRANSURETHRAL RESECTION OF PROSTATE [name] 01/26/2016   Procedure: TRANSURETHRAL RESECTION OF THE PROSTATE (TURP);  Surgeon: Franchot Gallo, MD;  Location: [facility];  Service: Urology;  Laterality: [name];     Allergies: Dilaudid [hydromorphone hcl]; Fentanyl; Nexium [esomeprazole magnesium]; Other; Oxycodone; and Statins  Medications: Prior to Admission medications   Medication Sig Start Date End Date Taking? Authorizing Provider  ALPRAZolam Duanne Moron) 0.5 MG tablet Take 0.5 mg by mouth at bedtime as needed for sleep.  07/22/14  Yes [provider]  flecainide (TAMBOCOR) 100 MG tablet Take 1 tablet (100 mg total) by mouth 2 (two) times daily. 06/22/16  Yes Deboraha Sprang, MD  metoprolol succinate (TOPROL-XL) 50 MG 24 hr tablet Take 50 mg by mouth daily.   Yes [provider]  ondansetron (ZOFRAN) 8 MG tablet Take 1 tablet (8 mg total) by mouth every 8 (eight) hours as  needed for nausea or vomiting. 08/15/16  Yes Ladell Pier, MD  pantoprazole (PROTONIX) 20 MG tablet Take 1 tablet (20 mg total) by mouth 2 (two) times daily. 08/15/16  Yes Ladell Pier, MD     Vital Signs: BP (!) 160/84   Pulse 66   Temp 98 F (36.7 C) (Oral)   Resp 16   Ht 6\' 3"  (1.905 m)   Wt 177 lb (80.3 kg)   SpO2 96%   BMI 22.12 kg/m   Physical Exam awake /alert; chest with slightly diminished breath sounds bases; heart with regular rate and rhythm, positive murmur; abdomen soft, positive bowel sounds, nontender; lower extremities with trace edema.  Imaging: No results found.  Labs:  CBC:  Recent Labs  01/26/16 1231 08/01/16 1125  WBC  --  4.6  HGB 14.3 14.8  HCT  --  44.0  PLT  --  181    COAGS:  Recent Labs  08/01/16 1125  INR 1.00    BMP:  Recent Labs  01/27/16 0531 08/01/16 1125  NA  --  140  K  --  3.8  CL  --  104  CO2  --  30  GLUCOSE  --  91  BUN  --  19  CALCIUM  --  8.8*  CREATININE 0.84 0.79  GFRNONAA >60 >60  GFRAA >60 >60    LIVER FUNCTION TESTS: No results for input(s): BILITOT, AST, ALT, ALKPHOS, PROT, ALBUMIN in the last 8760 hours.  Assessment and Plan: Pt with history of metastatic esophageal carcinoma who presents today for Port-A-Cath placement for chemotherapy. Risks and benefits discussed with the patient/spouse including, but not limited to bleeding, infection, pneumothorax, or fibrin sheath development and need for additional procedures. All of the patient's questions were answered, patient is agreeable to proceed. Consent signed and in chart.     Electronically Signed: D. Nash Mantis 09/06/2016, 9:59 AM   I spent a total of 20 minutes at the the patient's bedside AND on the patient's hospital floor or unit, greater than 50% of which was counseling/coordinating care for Port-A-Cath placement

## 2016-09-06 NOTE — Sedation Documentation (Signed)
Patient denies pain and is resting comfortably.  

## 2016-09-06 NOTE — Telephone Encounter (Signed)
Lab, flush, follow up and FOLFOX  Appointments scheduled every 2 weeks per 09/06/16, throughout 10/19/16.  Patient was given a copy of the AVS and appointment schedule per 09/06/16 los.

## 2016-09-06 NOTE — Progress Notes (Signed)
[place] OFFICE PROGRESS NOTE   Diagnosis: Esophagus cancer  INTERVAL HISTORY:   David [name] underwent placement of a Port-A-Cath earlier today. He reports the procedure went well. He complains of malaise, and intermittent cough, and anorexia. No dysphagia. He was scheduled to begin FOLFOX chemotherapy next week, but a chemotherapy spot became open for this afternoon. He returns to begin FOLFOX chemotherapy.  Objective:  Vital signs in last 24 hours:  Blood pressure 128/74, pulse 65, temperature 97.8 F (36.6 C), temperature source Oral, resp. rate 18, weight 179 lb 8 oz (81.4 kg), SpO2 94 %.    Resp: Lungs clear bilaterally, no respiratory distress Cardio: Regular rate and rhythm GI: No hepatosplenomegaly, nontender Vascular: No leg edema Skin: Superficial decubitus ulcer at the right upper gluteal fold    Portacath site at the right upper chest with a gauze dressing.  Lab Results:  Lab Results  Component Value Date   WBC 5.2 09/06/2016   HGB 15.2 09/06/2016   HCT 45.3 09/06/2016   MCV 97.4 09/06/2016   PLT 192 09/06/2016   NEUTROABS 4.2 09/06/2016    CMP     Component Value Date/Time   NA 139 09/06/2016 0947   NA 142 11/26/2014 1122   K 3.8 09/06/2016 0947   K 4.1 11/26/2014 1122   CL 102 09/06/2016 0947   CO2 29 09/06/2016 0947   CO2 28 11/26/2014 1122   GLUCOSE 98 09/06/2016 0947   GLUCOSE 94 11/26/2014 1122   BUN 19 09/06/2016 0947   BUN 19.0 11/26/2014 1122   CREATININE 0.76 09/06/2016 0947   CREATININE 0.8 11/26/2014 1122   CALCIUM 8.9 09/06/2016 0947   CALCIUM 8.8 11/26/2014 1122   PROT 6.6 01/28/2015 1226   PROT 6.0 (L) 11/26/2014 1122   ALBUMIN 3.7 01/28/2015 1226   ALBUMIN 3.4 (L) 11/26/2014 1122   AST 31 01/28/2015 1226   AST 16 11/26/2014 1122   ALT 29 01/28/2015 1226   ALT 15 11/26/2014 1122   ALKPHOS 74 01/28/2015 1226   ALKPHOS 60 11/26/2014 1122   BILITOT 0.5 01/28/2015 1226   BILITOT 0.93 11/26/2014 1122   GFRNONAA  >60 09/06/2016 0947   GFRAA >60 09/06/2016 0947    Medications: I have reviewed the patient's current medications.  Assessment/Plan: 1. Adenocarcinoma of the distal esophagus,uT3uN2  Presenting with a food impaction and dysphagia 08/20/2014  Staging CT scans of the chest, abdomen, and pelvis 10/06/2014 with indeterminate pulmonary nodules, a 9 mm paraesophageal lymph node, and circumferential thickening of the distal esophagus  PET scan 10/23/2014 confirmed hypermetabolic thickening at the distal esophagus with small paraesophageal nodes having faint nonspecific activity. A pleural-based focus of consolidation at the posterior right lower lobe had low metabolic activity-favored to be a benign process.  Initiation of concurrent weekly Taxol/carboplatin and radiation on 11/02/2014, radiation completed 12/09/2014  CTs 12/31/2014 revealed distal esophageal wall thickening with adjacent small lymph nodes suspicious for metastases, stable lung nodule posterior to the right bronchus intermedius and the persistent 5 mm nodule in the right lower lobe  Esophagectomy 02/01/2015 with the pathology confirming a ypT3,ypN3 tumor, positive distal margin  CT chest 07/17/2016-bilateral pleural effusions, Bibasilar airspace opacities with peribronchial vascular nodularity, new low-density right liver lesion  Left thoracentesis 07/19/2016-cytology showedreactive mesothelial cells; mixed lymphoid population  Ultrasound-guided biopsy of a segment 8 liver lesion on 08/01/2016-adenocarcinoma  Cycle 1 FOLFOX 09/06/2016  2. Prostatic hypertrophy-Status post TUR 01/26/2016  3. Hypertension  4. History of multiple basal cell skin cancers  5.  History of thrombocytopenia secondary to chemotherapy  6. PVCs/bicuspid aortic valve-followed by cardiology  7.  Port-A-Cath placement 09/06/2016   Disposition:  Mr. Sanden appears stable. He underwent placement of a Port-A-Cath earlier today. The  plan is to begin FOLFOX chemotherapy today. We reviewed potential toxicities associated with the FOLFOX regimen. He agrees to proceed. We will check a CEA today. He will return for an office visit and cycle 2 FOLFOX on 09/21/2016. He will contact us in the interim as needed. We will obtain a chest x-ray to follow-up on the pleural effusions when he is here for the pump disconnect on 09/08/2016.  He will keep the gluteal ulcer clean and try to avoid pressure on this area.  25 minutes were spent with the patient today. The majority of the time was used for counseling and coordination of care.   Betsy Coder, MD  09/06/2016  3:50 PM

## 2016-09-06 NOTE — Patient Instructions (Signed)
[facility] Discharge Instructions for Patients Receiving Chemotherapy  Today you received the following chemotherapy agents: Oxaliplatin, Leucovorin, Adrucil   To help prevent nausea and vomiting after your treatment, we encourage you to take your nausea medication as prescribed.    If you develop nausea and vomiting that is not controlled by your nausea medication, call the clinic.   BELOW ARE SYMPTOMS THAT SHOULD BE REPORTED IMMEDIATELY:  *FEVER GREATER THAN 100.5 F  *CHILLS WITH OR WITHOUT FEVER  NAUSEA AND VOMITING THAT IS NOT CONTROLLED WITH YOUR NAUSEA MEDICATION  *UNUSUAL SHORTNESS OF BREATH  *UNUSUAL BRUISING OR BLEEDING  TENDERNESS IN MOUTH AND THROAT WITH OR WITHOUT PRESENCE OF ULCERS  *URINARY PROBLEMS  *BOWEL PROBLEMS  UNUSUAL RASH Items with * indicate a potential emergency and should be followed up as soon as possible.  Feel free to call the clinic you have any questions or concerns. The clinic phone number is [phone].  Please show the [facility] at check-in to the Emergency Department and triage nurse. Oxaliplatin Injection What is this medicine? OXALIPLATIN (ox AL i PLA tin) is a chemotherapy drug. It targets fast dividing cells, like cancer cells, and causes these cells to die. This medicine is used to treat cancers of the colon and rectum, and many other cancers. This medicine may be used for other purposes; ask your health care provider or pharmacist if you have questions. COMMON BRAND NAME(S): Eloxatin What should I tell my health care provider before I take this medicine? They need to know if you have any of these conditions: -kidney disease -an unusual or allergic reaction to oxaliplatin, other chemotherapy, other medicines, foods, dyes, or preservatives -pregnant or trying to get pregnant -breast-feeding How should I use this medicine? This drug is given as an infusion into a vein. It is administered in a hospital or  clinic by a specially trained health care professional. Talk to your pediatrician regarding the use of this medicine in children. Special care may be needed. Overdosage: If you think you have taken too much of this medicine contact a poison control center or emergency room at once. NOTE: This medicine is only for you. Do not share this medicine with others. What if I miss a dose? It is important not to miss a dose. Call your doctor or health care professional if you are unable to keep an appointment. What may interact with this medicine? -medicines to increase blood counts like filgrastim, pegfilgrastim, sargramostim -probenecid -some antibiotics like amikacin, gentamicin, neomycin, polymyxin B, streptomycin, tobramycin -zalcitabine Talk to your doctor or health care professional before taking any of these medicines: -acetaminophen -aspirin -ibuprofen -ketoprofen -naproxen This list may not describe all possible interactions. Give your health care provider a list of all the medicines, herbs, non-prescription drugs, or dietary supplements you use. Also tell them if you smoke, drink alcohol, or use illegal drugs. Some items may interact with your medicine. What should I watch for while using this medicine? Your condition will be monitored carefully while you are receiving this medicine. You will need important blood work done while you are taking this medicine. This medicine can make you more sensitive to cold. Do not drink cold drinks or use ice. Cover exposed skin before coming in contact with cold temperatures or cold objects. When out in cold weather wear warm clothing and cover your mouth and nose to warm the air that goes into your lungs. Tell your doctor if you get sensitive to the cold. This  drug may make you feel generally unwell. This is not uncommon, as chemotherapy can affect healthy cells as well as cancer cells. Report any side effects. Continue your course of treatment even though  you feel ill unless your doctor tells you to stop. In some cases, you may be given additional medicines to help with side effects. Follow all directions for their use. Call your doctor or health care professional for advice if you get a fever, chills or sore throat, or other symptoms of a cold or flu. Do not treat yourself. This drug decreases your body's ability to fight infections. Try to avoid being around people who are sick. This medicine may increase your risk to bruise or bleed. Call your doctor or health care professional if you notice any unusual bleeding. Be careful brushing and flossing your teeth or using a toothpick because you may get an infection or bleed more easily. If you have any dental work done, tell your dentist you are receiving this medicine. Avoid taking products that contain aspirin, acetaminophen, ibuprofen, naproxen, or ketoprofen unless instructed by your doctor. These medicines may hide a fever. Do not become pregnant while taking this medicine. Women should inform their doctor if they wish to become pregnant or think they might be pregnant. There is a potential for serious side effects to an unborn child. Talk to your health care professional or pharmacist for more information. Do not breast-feed an infant while taking this medicine. Call your doctor or health care professional if you get diarrhea. Do not treat yourself. What side effects may I notice from receiving this medicine? Side effects that you should report to your doctor or health care professional as soon as possible: -allergic reactions like skin rash, itching or hives, swelling of the face, lips, or tongue -low blood counts - This drug may decrease the number of white blood cells, red blood cells and platelets. You may be at increased risk for infections and bleeding. -signs of infection - fever or chills, cough, sore throat, pain or difficulty passing urine -signs of decreased platelets or bleeding -  bruising, pinpoint red spots on the skin, black, tarry stools, nosebleeds -signs of decreased red blood cells - unusually weak or tired, fainting spells, lightheadedness -breathing problems -chest pain, pressure -cough -diarrhea -jaw tightness -mouth sores -nausea and vomiting -pain, swelling, redness or irritation at the injection site -pain, tingling, numbness in the hands or feet -problems with balance, talking, walking -redness, blistering, peeling or loosening of the skin, including inside the mouth -trouble passing urine or change in the amount of urine Side effects that usually do not require medical attention (report to your doctor or health care professional if they continue or are bothersome): -changes in vision -constipation -hair loss -loss of appetite -metallic taste in the mouth or changes in taste -stomach pain This list may not describe all possible side effects. Call your doctor for medical advice about side effects. You may report side effects to FDA at 1-800-FDA-1088. Where should I keep my medicine? This drug is given in a hospital or clinic and will not be stored at home. NOTE: This sheet is a summary. It may not cover all possible information. If you have questions about this medicine, talk to your doctor, pharmacist, or health care provider.  2018 Elsevier/Gold Standard (2007-11-05 17:22:47)  Leucovorin injection What is this medicine? LEUCOVORIN (loo koe VOR in) is used to prevent or treat the harmful effects of some medicines. This medicine is used to treat anemia  caused by a low amount of folic acid in the body. It is also used with 5-fluorouracil (5-FU) to treat colon cancer. This medicine may be used for other purposes; ask your health care provider or pharmacist if you have questions. What should I tell my health care provider before I take this medicine? They need to know if you have any of these conditions: -anemia from low levels of vitamin B-12 in  the blood -an unusual or allergic reaction to leucovorin, folic acid, other medicines, foods, dyes, or preservatives -pregnant or trying to get pregnant -breast-feeding How should I use this medicine? This medicine is for injection into a muscle or into a vein. It is given by a health care professional in a hospital or clinic setting. Talk to your pediatrician regarding the use of this medicine in children. Special care may be needed. Overdosage: If you think you have taken too much of this medicine contact a poison control center or emergency room at once. NOTE: This medicine is only for you. Do not share this medicine with others. What if I miss a dose? This does not apply. What may interact with this medicine? -capecitabine -fluorouracil -phenobarbital -phenytoin -primidone -trimethoprim-sulfamethoxazole This list may not describe all possible interactions. Give your health care provider a list of all the medicines, herbs, non-prescription drugs, or dietary supplements you use. Also tell them if you smoke, drink alcohol, or use illegal drugs. Some items may interact with your medicine. What should I watch for while using this medicine? Your condition will be monitored carefully while you are receiving this medicine. This medicine may increase the side effects of 5-fluorouracil, 5-FU. Tell your doctor or health care professional if you have diarrhea or mouth sores that do not get better or that get worse. What side effects may I notice from receiving this medicine? Side effects that you should report to your doctor or health care professional as soon as possible: -allergic reactions like skin rash, itching or hives, swelling of the face, lips, or tongue -breathing problems -fever, infection -mouth sores -unusual bleeding or bruising -unusually weak or tired Side effects that usually do not require medical attention (report to your doctor or health care professional if they continue or  are bothersome): -constipation or diarrhea -loss of appetite -nausea, vomiting This list may not describe all possible side effects. Call your doctor for medical advice about side effects. You may report side effects to FDA at 1-800-FDA-1088. Where should I keep my medicine? This drug is given in a hospital or clinic and will not be stored at home. NOTE: This sheet is a summary. It may not cover all possible information. If you have questions about this medicine, talk to your doctor, pharmacist, or health care provider.  2018 Elsevier/Gold Standard (2007-10-15 16:50:29) Fluorouracil, 5-FU injection What is this medicine? FLUOROURACIL, 5-FU (flure oh YOOR a sil) is a chemotherapy drug. It slows the growth of cancer cells. This medicine is used to treat many types of cancer like breast cancer, colon or rectal cancer, pancreatic cancer, and stomach cancer. This medicine may be used for other purposes; ask your health care provider or pharmacist if you have questions. COMMON BRAND NAME(S): Adrucil What should I tell my health care provider before I take this medicine? They need to know if you have any of these conditions: -blood disorders -dihydropyrimidine dehydrogenase (DPD) deficiency -infection (especially a virus infection such as chickenpox, cold sores, or herpes) -kidney disease -liver disease -malnourished, poor nutrition -recent or ongoing radiation  therapy -an unusual or allergic reaction to fluorouracil, other chemotherapy, other medicines, foods, dyes, or preservatives -pregnant or trying to get pregnant -breast-feeding How should I use this medicine? This drug is given as an infusion or injection into a vein. It is administered in a hospital or clinic by a specially trained health care professional. Talk to your pediatrician regarding the use of this medicine in children. Special care may be needed. Overdosage: If you think you have taken too much of this medicine contact a  poison control center or emergency room at once. NOTE: This medicine is only for you. Do not share this medicine with others. What if I miss a dose? It is important not to miss your dose. Call your doctor or health care professional if you are unable to keep an appointment. What may interact with this medicine? -allopurinol -cimetidine -dapsone -digoxin -hydroxyurea -leucovorin -levamisole -medicines for seizures like ethotoin, fosphenytoin, phenytoin -medicines to increase blood counts like filgrastim, pegfilgrastim, sargramostim -medicines that treat or prevent blood clots like warfarin, enoxaparin, and dalteparin -methotrexate -metronidazole -pyrimethamine -some other chemotherapy drugs like busulfan, cisplatin, estramustine, vinblastine -trimethoprim -trimetrexate -vaccines Talk to your doctor or health care professional before taking any of these medicines: -acetaminophen -aspirin -ibuprofen -ketoprofen -naproxen This list may not describe all possible interactions. Give your health care provider a list of all the medicines, herbs, non-prescription drugs, or dietary supplements you use. Also tell them if you smoke, drink alcohol, or use illegal drugs. Some items may interact with your medicine. What should I watch for while using this medicine? Visit your doctor for checks on your progress. This drug may make you feel generally unwell. This is not uncommon, as chemotherapy can affect healthy cells as well as cancer cells. Report any side effects. Continue your course of treatment even though you feel ill unless your doctor tells you to stop. In some cases, you may be given additional medicines to help with side effects. Follow all directions for their use. Call your doctor or health care professional for advice if you get a fever, chills or sore throat, or other symptoms of a cold or flu. Do not treat yourself. This drug decreases your body's ability to fight infections. Try to  avoid being around people who are sick. This medicine may increase your risk to bruise or bleed. Call your doctor or health care professional if you notice any unusual bleeding. Be careful brushing and flossing your teeth or using a toothpick because you may get an infection or bleed more easily. If you have any dental work done, tell your dentist you are receiving this medicine. Avoid taking products that contain aspirin, acetaminophen, ibuprofen, naproxen, or ketoprofen unless instructed by your doctor. These medicines may hide a fever. Do not become pregnant while taking this medicine. Women should inform their doctor if they wish to become pregnant or think they might be pregnant. There is a potential for serious side effects to an unborn child. Talk to your health care professional or pharmacist for more information. Do not breast-feed an infant while taking this medicine. Men should inform their doctor if they wish to father a child. This medicine may lower sperm counts. Do not treat diarrhea with over the counter products. Contact your doctor if you have diarrhea that lasts more than 2 days or if it is severe and watery. This medicine can make you more sensitive to the sun. Keep out of the sun. If you cannot avoid being in the sun, wear protective  clothing and use sunscreen. Do not use sun lamps or tanning beds/booths. What side effects may I notice from receiving this medicine? Side effects that you should report to your doctor or health care professional as soon as possible: -allergic reactions like skin rash, itching or hives, swelling of the face, lips, or tongue -low blood counts - this medicine may decrease the number of white blood cells, red blood cells and platelets. You may be at increased risk for infections and bleeding. -signs of infection - fever or chills, cough, sore throat, pain or difficulty passing urine -signs of decreased platelets or bleeding - bruising, pinpoint red spots  on the skin, black, tarry stools, blood in the urine -signs of decreased red blood cells - unusually weak or tired, fainting spells, lightheadedness -breathing problems -changes in vision -chest pain -mouth sores -nausea and vomiting -pain, swelling, redness at site where injected -pain, tingling, numbness in the hands or feet -redness, swelling, or sores on hands or feet -stomach pain -unusual bleeding Side effects that usually do not require medical attention (report to your doctor or health care professional if they continue or are bothersome): -changes in finger or toe nails -diarrhea -dry or itchy skin -hair loss -headache -loss of appetite -sensitivity of eyes to the light -stomach upset -unusually teary eyes This list may not describe all possible side effects. Call your doctor for medical advice about side effects. You may report side effects to FDA at 1-800-FDA-1088. Where should I keep my medicine? This drug is given in a hospital or clinic and will not be stored at home. NOTE: This sheet is a summary. It may not cover all possible information. If you have questions about this medicine, talk to your doctor, pharmacist, or health care provider.  2018 Elsevier/Gold Standard (2007-08-14 13:53:16)

## 2016-09-06 NOTE — Procedures (Signed)
Esophageal carcinoma  Status post right IJ single lumen power port catheter  Tip SVC/RA junction  Access ready for use  EBL less than 5 mL  No immediate complication  Full report in PACs

## 2016-09-06 NOTE — Telephone Encounter (Signed)
Spoke with pt's wife, offered appt for treatment today. She will bring him in to office now to see MD, will work in for infusion.

## 2016-09-06 NOTE — Progress Notes (Signed)
Blood return noted before during and after Adrucil push. Pt tolerated infusion well.  Verbal and printed instructions given to pt, pt and patients wife verbalizes understanding. Pt stable at discharge.

## 2016-09-06 NOTE — Telephone Encounter (Signed)
Script for a hospital bed reprinted and signed by Dr. Benay Spice. Pt aware it is up at reception desk for pick up.

## 2016-09-06 NOTE — Sedation Documentation (Signed)
Patient is resting comfortably. 

## 2016-09-06 NOTE — Discharge Instructions (Signed)
Moderate Conscious Sedation, Adult, Care After °These instructions provide you with information about caring for yourself after your procedure. Your health care provider may also give you more specific instructions. Your treatment has been planned according to current medical practices, but problems sometimes occur. Call your health care provider if you have any problems or questions after your procedure. °What can I expect after the procedure? °After your procedure, it is common: °· To feel sleepy for several hours. °· To feel clumsy and have poor balance for several hours. °· To have poor judgment for several hours. °· To vomit if you eat too soon. °Follow these instructions at home: °For at least 24 hours after the procedure:  ° °· Do not: °¨ Participate in activities where you could fall or become injured. °¨ Drive. °¨ Use heavy machinery. °¨ Drink alcohol. °¨ Take sleeping pills or medicines that cause drowsiness. °¨ Make important decisions or sign legal documents. °¨ Take care of children on your own. °· Rest. °Eating and drinking  °· Follow the diet recommended by your health care provider. °· If you vomit: °¨ Drink water, juice, or soup when you can drink without vomiting. °¨ Make sure you have little or no nausea before eating solid foods. °General instructions  °· Have a responsible adult stay with you until you are awake and alert. °· Take over-the-counter and prescription medicines only as told by your health care provider. °· If you smoke, do not smoke without supervision. °· Keep all follow-up visits as told by your health care provider. This is important. °Contact a health care provider if: °· You keep feeling nauseous or you keep vomiting. °· You feel light-headed. °· You develop a rash. °· You have a fever. °Get help right away if: °· You have trouble breathing. °This information is not intended to replace advice given to you by your health care provider. Make sure you discuss any questions you  have with your health care provider. °Document Released: 01/29/2013 Document Revised: 09/13/2015 Document Reviewed: 07/31/2015 °Elsevier Interactive Patient Education © 2017 [employer]. ° ° °Implanted Port Insertion, Care After °This sheet gives you information about how to care for yourself after your procedure. Your health care provider may also give you more specific instructions. If you have problems or questions, contact your health care provider. °What can I expect after the procedure? °After your procedure, it is common to have: °· Discomfort at the port insertion site. °· Bruising on the skin over the port. This should improve over 3-4 days. °Follow these instructions at home: °Port care  °· After your port is placed, you will get a manufacturer's information card. The card has information about your port. Keep this card with you at all times. °· Take care of the port as told by your health care provider. Ask your health care provider if you or a family member can get training for taking care of the port at home. A home health care nurse may also take care of the port. °· Make sure to remember what type of port you have. °Incision care  °· Follow instructions from your health care provider about how to take care of your port insertion site. Make sure you: °¨ Wash your hands with soap and water before you change your bandage (dressing). If soap and water are not available, use hand sanitizer. °¨ Change your dressing as told by your health care provider. °¨ Leave stitches (sutures), skin glue, or adhesive strips in place. These skin   closures may need to stay in place for 2 weeks or longer. If adhesive strip edges start to loosen and curl up, you may trim the loose edges. Do not remove adhesive strips completely unless your health care provider tells you to do that. °· Check your port insertion site every day for signs of infection. Check for: °¨ More redness, swelling, or pain. °¨ More fluid or  blood. °¨ Warmth. °¨ Pus or a bad smell. °General instructions  °· Do not take baths, swim, or use a hot tub until your health care provider approves. °· Do not lift anything that is heavier than 10 lb (4.5 kg) for a week, or as told by your health care provider. °· Ask your health care provider when it is okay to: °¨ Return to work or school. °¨ Resume usual physical activities or sports. °· Do not drive for 24 hours if you were given a medicine to help you relax (sedative). °· Take over-the-counter and prescription medicines only as told by your health care provider. °· Wear a medical alert bracelet in case of an emergency. This will tell any health care providers that you have a port. °· Keep all follow-up visits as told by your health care provider. This is important. °Contact a health care provider if: °· You cannot flush your port with saline as directed, or you cannot draw blood from the port. °· You have a fever or chills. °· You have more redness, swelling, or pain around your port insertion site. °· You have more fluid or blood coming from your port insertion site. °· Your port insertion site feels warm to the touch. °· You have pus or a bad smell coming from the port insertion site. °Get help right away if: °· You have chest pain or shortness of breath. °· You have bleeding from your port that you cannot control. °Summary °· Take care of the port as told by your health care provider. °· Change your dressing as told by your health care provider. °· Keep all follow-up visits as told by your health care provider. °This information is not intended to replace advice given to you by your health care provider. Make sure you discuss any questions you have with your health care provider. °Document Released: 01/29/2013 Document Revised: 03/01/2016 Document Reviewed: 03/01/2016 °Elsevier Interactive Patient Education © 2017 [employer]. ° °

## 2016-09-06 NOTE — Progress Notes (Signed)
START ON PATHWAY REGIMEN - Gastroesophageal     A cycle is every 14 days:     Oxaliplatin      Leucovorin      5-Fluorouracil      5-Fluorouracil   **Always confirm dose/schedule in your pharmacy ordering system**    Patient Characteristics: Distant Metastases (cM1/pM1) / Locally Recurrent Disease, Adenocarcinoma - Esophageal, GE Junction, and Gastric, First Line, HER2 Negative / Unknown Histology: Adenocarcinoma Disease Classification: Esophageal Therapeutic Status: Distant Metastases (No Additional Staging) Would you be surprised if this patient died  in the next year? I would NOT be surprised if this patient died in the next year Line of therapy: First Line HER2 Status: Negative  Intent of Therapy: Non-Curative / Palliative Intent, Discussed with Patient

## 2016-09-07 ENCOUNTER — Telehealth: Payer: Self-pay | Admitting: *Deleted

## 2016-09-07 DIAGNOSIS — C155 Malignant neoplasm of lower third of esophagus: Secondary | ICD-10-CM

## 2016-09-07 LAB — CEA (PARALLEL TESTING): CEA: 3.2 ng/mL — AB

## 2016-09-07 LAB — CEA (IN HOUSE-CHCC): CEA (CHCC-IN HOUSE): 3.73 ng/mL (ref 0.00–5.00)

## 2016-09-07 MED ORDER — PANTOPRAZOLE SODIUM 20 MG PO TBEC: 20.0000 mg | DELAYED_RELEASE_TABLET | Freq: Every day | ORAL | 1 refills | Status: DC

## 2016-09-07 NOTE — Telephone Encounter (Signed)
1335: Chemo follow up call: Reviewed side effects of FOLFOX. Pt denies any issues with ambulatory pump. Instructed him to push PO fluids. He reports he forgot about cold sensitivity and ate an ice cream bar without any ill effects. He understands these side effects can happen at any time and will be cautious.

## 2016-09-08 ENCOUNTER — Ambulatory Visit (HOSPITAL_BASED_OUTPATIENT_CLINIC_OR_DEPARTMENT_OTHER): Payer: Medicare Other

## 2016-09-08 ENCOUNTER — Ambulatory Visit (HOSPITAL_COMMUNITY)
Admission: RE | Admit: 2016-09-08 | Discharge: 2016-09-08 | Disposition: A | Discharge status: Home / Self Care | Payer: Medicare Other | Source: Ambulatory Visit | Attending: Oncology | Admitting: Oncology

## 2016-09-08 VITALS — BP 161/75 | HR 66 | Temp 97.2°F | Resp 18

## 2016-09-08 DIAGNOSIS — J90 Pleural effusion, not elsewhere classified: Secondary | ICD-10-CM | POA: Insufficient documentation

## 2016-09-08 DIAGNOSIS — Z452 Encounter for adjustment and management of vascular access device: Secondary | ICD-10-CM | POA: Diagnosis present

## 2016-09-08 DIAGNOSIS — C155 Malignant neoplasm of lower third of esophagus: Secondary | ICD-10-CM | POA: Diagnosis not present

## 2016-09-08 DIAGNOSIS — R0602 Shortness of breath: Secondary | ICD-10-CM | POA: Diagnosis not present

## 2016-09-08 DIAGNOSIS — R05 Cough: Secondary | ICD-10-CM | POA: Diagnosis not present

## 2016-09-08 MED ORDER — SODIUM CHLORIDE 0.9% FLUSH
10.0000 mL | INTRAVENOUS | Status: DC | PRN
  Administered 2016-09-08: 10 mL
  Filled 2016-09-08: qty 10

## 2016-09-08 MED ORDER — HEPARIN SOD (PORK) LOCK FLUSH 100 UNIT/ML IV SOLN
500.0000 [IU] | Freq: Once | INTRAVENOUS | Status: AC | PRN
  Administered 2016-09-08: 500 [IU]
  Filled 2016-09-08: qty 5

## 2016-09-08 NOTE — Patient Instructions (Signed)
Implanted Port Home Guide An implanted port is a type of central line that is placed under the skin. Central lines are used to provide IV access when treatment or nutrition needs to be given through a person's veins. Implanted ports are used for long-term IV access. An implanted port may be placed because:  You need IV medicine that would be irritating to the small veins in your hands or arms.  You need long-term IV medicines, such as antibiotics.  You need IV nutrition for a long period.  You need frequent blood draws for lab tests.  You need dialysis.  Implanted ports are usually placed in the chest area, but they can also be placed in the upper arm, the abdomen, or the leg. An implanted port has two main parts:  Reservoir. The reservoir is round and will appear as a small, raised area under your skin. The reservoir is the part where a needle is inserted to give medicines or draw blood.  Catheter. The catheter is a thin, flexible tube that extends from the reservoir. The catheter is placed into a large vein. Medicine that is inserted into the reservoir goes into the catheter and then into the vein.  How will I care for my incision site? Do not get the incision site wet. Bathe or shower as directed by your health care provider. How is my port accessed? Special steps must be taken to access the port:  Before the port is accessed, a numbing cream can be placed on the skin. This helps numb the skin over the port site.  Your health care provider uses a sterile technique to access the port. ? Your health care provider must put on a mask and sterile gloves. ? The skin over your port is cleaned carefully with an antiseptic and allowed to dry. ? The port is gently pinched between sterile gloves, and a needle is inserted into the port.  Only "non-coring" port needles should be used to access the port. Once the port is accessed, a blood return should be checked. This helps ensure that the port  is in the vein and is not clogged.  If your port needs to remain accessed for a constant infusion, a clear (transparent) bandage will be placed over the needle site. The bandage and needle will need to be changed every week, or as directed by your health care provider.  Keep the bandage covering the needle clean and dry. Do not get it wet. Follow your health care provider's instructions on how to take a shower or bath while the port is accessed.  If your port does not need to stay accessed, no bandage is needed over the port.  What is flushing? Flushing helps keep the port from getting clogged. Follow your health care provider's instructions on how and when to flush the port. Ports are usually flushed with saline solution or a medicine called heparin. The need for flushing will depend on how the port is used.  If the port is used for intermittent medicines or blood draws, the port will need to be flushed: ? After medicines have been given. ? After blood has been drawn. ? As part of routine maintenance.  If a constant infusion is running, the port may not need to be flushed.  How long will my port stay implanted? The port can stay in for as long as your health care provider thinks it is needed. When it is time for the port to come out, surgery will be   done to remove it. The procedure is similar to the one performed when the port was put in. When should I seek immediate medical care? When you have an implanted port, you should seek immediate medical care if:  You notice a bad smell coming from the incision site.  You have swelling, redness, or drainage at the incision site.  You have more swelling or pain at the port site or the surrounding area.  You have a fever that is not controlled with medicine.  This information is not intended to replace advice given to you by your health care provider. Make sure you discuss any questions you have with your health care provider. Document  Released: 04/10/2005 Document Revised: 09/16/2015 Document Reviewed: 12/16/2012 Elsevier Interactive Patient Education  2017 [employer]  

## 2016-09-12 ENCOUNTER — Encounter: Payer: Self-pay | Admitting: Oncology

## 2016-09-12 ENCOUNTER — Other Ambulatory Visit: Payer: Self-pay | Admitting: *Deleted

## 2016-09-12 ENCOUNTER — Encounter: Payer: Self-pay | Admitting: Nurse Practitioner

## 2016-09-12 MED ORDER — LIDOCAINE-PRILOCAINE 2.5-2.5 % EX CREA: TOPICAL_CREAM | CUTANEOUS | 0 refills | Status: DC

## 2016-09-13 ENCOUNTER — Encounter: Payer: Self-pay | Admitting: Oncology

## 2016-09-13 NOTE — Progress Notes (Signed)
Received PA approval for Lidocaine-Prilocaine cream from [employer].  PA approved 04/22/16-12/14/16  Called [facility]([place]) to advise of approval. She ran it and states it went through and they will contact patient when it is ready.

## 2016-09-13 NOTE — Telephone Encounter (Signed)
Telephone call to patient to discuss mychart messages. Patient received results on Mychart and was not able to understand them. We discussed labs and Xray results and patient verbalized an understanding. Patient reported he has been increasing water intake. He reports he has to get up numerous times at night to urinate. Inquired amount patient has been drinking and when. Patient reported he drinks 6-8 glasses of water following dinner. He is unable to drink and eat at the same time as this makes him feel too full and nauseated. Advised patient to try to break up this water intake and not have so much right before bedtime also to have an earlier dinner to provide additional time prior to bed for fluid intake/output. Pt states he will try this. Patient would like to further discuss this with Dr. Benay Spice next week. Patient will call this office in the meantime with any concerns or questions.

## 2016-09-13 NOTE — Progress Notes (Signed)
Received PA request for Lidocaine-Prilocaine cream.   Submitted via Cover My Meds  David [name] (Key: [facility]) [phone]  Need help? Call us at [phone]   Status  Sent to [employer]  Next Steps  The plan will fax you a determination, typically within 1 to 5 business days.

## 2016-09-14 ENCOUNTER — Other Ambulatory Visit: Payer: Medicare Other

## 2016-09-14 ENCOUNTER — Ambulatory Visit: Payer: Medicare Other

## 2016-09-14 ENCOUNTER — Ambulatory Visit: Payer: Medicare Other | Admitting: Nurse Practitioner

## 2016-09-18 ENCOUNTER — Other Ambulatory Visit: Payer: Self-pay | Admitting: Oncology

## 2016-09-19 ENCOUNTER — Telehealth: Payer: Self-pay | Admitting: *Deleted

## 2016-09-19 NOTE — Telephone Encounter (Signed)
Pt called to confirm appointments. Reassured him he did not miss appt 5/24 it was rescheduled to 5/16. Discussed Saturday appointment process, he understands to check in in infusion. Pt plans to go out of town on 6/3.

## 2016-09-21 ENCOUNTER — Ambulatory Visit (HOSPITAL_BASED_OUTPATIENT_CLINIC_OR_DEPARTMENT_OTHER): Payer: Medicare Other | Admitting: Oncology

## 2016-09-21 ENCOUNTER — Ambulatory Visit (HOSPITAL_BASED_OUTPATIENT_CLINIC_OR_DEPARTMENT_OTHER): Payer: Medicare Other

## 2016-09-21 ENCOUNTER — Ambulatory Visit: Payer: Medicare Other

## 2016-09-21 ENCOUNTER — Other Ambulatory Visit (HOSPITAL_BASED_OUTPATIENT_CLINIC_OR_DEPARTMENT_OTHER): Payer: Medicare Other

## 2016-09-21 VITALS — BP 149/79 | HR 72 | Temp 98.4°F | Resp 17 | Ht 75.0 in | Wt 177.5 lb

## 2016-09-21 DIAGNOSIS — C155 Malignant neoplasm of lower third of esophagus: Secondary | ICD-10-CM | POA: Diagnosis not present

## 2016-09-21 DIAGNOSIS — C787 Secondary malignant neoplasm of liver and intrahepatic bile duct: Secondary | ICD-10-CM

## 2016-09-21 DIAGNOSIS — I10 Essential (primary) hypertension: Secondary | ICD-10-CM | POA: Diagnosis not present

## 2016-09-21 DIAGNOSIS — Z5111 Encounter for antineoplastic chemotherapy: Secondary | ICD-10-CM | POA: Diagnosis present

## 2016-09-21 DIAGNOSIS — Z7189 Other specified counseling: Secondary | ICD-10-CM

## 2016-09-21 DIAGNOSIS — Z95828 Presence of other vascular implants and grafts: Secondary | ICD-10-CM

## 2016-09-21 LAB — COMPREHENSIVE METABOLIC PANEL
ALK PHOS: 95 U/L (ref 40–150)
ALT: 14 U/L (ref 0–55)
AST: 19 U/L (ref 5–34)
Albumin: 3.1 g/dL — ABNORMAL LOW (ref 3.5–5.0)
Anion Gap: 7 mEq/L (ref 3–11)
BUN: 20 mg/dL (ref 7.0–26.0)
CALCIUM: 9 mg/dL (ref 8.4–10.4)
CHLORIDE: 107 meq/L (ref 98–109)
CO2: 31 mEq/L — ABNORMAL HIGH (ref 22–29)
CREATININE: 0.8 mg/dL (ref 0.7–1.3)
EGFR: 89 mL/min/{1.73_m2} — ABNORMAL LOW (ref >90)
Glucose: 89 mg/dl (ref 70–140)
Potassium: 3.6 mEq/L (ref 3.5–5.1)
Sodium: 146 mEq/L — ABNORMAL HIGH (ref 136–145)
TOTAL PROTEIN: 6.2 g/dL — AB (ref 6.4–8.3)
Total Bilirubin: 0.69 mg/dL (ref 0.20–1.20)

## 2016-09-21 LAB — CBC WITH DIFFERENTIAL/PLATELET
BASO%: 1 % (ref 0.0–2.0)
Basophils Absolute: 0 10*3/uL (ref 0.0–0.1)
EOS%: 1.8 % (ref 0.0–7.0)
Eosinophils Absolute: 0.1 10*3/uL (ref 0.0–0.5)
HEMATOCRIT: 43.2 % (ref 38.4–49.9)
HEMOGLOBIN: 14.3 g/dL (ref 13.0–17.1)
LYMPH#: 0.3 10*3/uL — AB (ref 0.9–3.3)
LYMPH%: 9.2 % — ABNORMAL LOW (ref 14.0–49.0)
MCH: 32.4 pg (ref 27.2–33.4)
MCHC: 33.2 g/dL (ref 32.0–36.0)
MCV: 97.5 fL (ref 79.3–98.0)
MONO#: 0.3 10*3/uL (ref 0.1–0.9)
MONO%: 10.9 % (ref 0.0–14.0)
NEUT%: 77.1 % — ABNORMAL HIGH (ref 39.0–75.0)
NEUTROS ABS: 2.4 10*3/uL (ref 1.5–6.5)
Platelets: 137 10*3/uL — ABNORMAL LOW (ref 140–400)
RBC: 4.43 10*6/uL (ref 4.20–5.82)
RDW: 13.8 % (ref 11.0–14.6)
WBC: 3.1 10*3/uL — ABNORMAL LOW (ref 4.0–10.3)

## 2016-09-21 MED ORDER — DEXTROSE 5 % IV SOLN
Freq: Once | INTRAVENOUS | Status: AC
  Administered 2016-09-21: 13:00:00 via INTRAVENOUS

## 2016-09-21 MED ORDER — DEXTROSE 5 % IV SOLN
85.0000 mg/m2 | Freq: Once | INTRAVENOUS | Status: AC
  Administered 2016-09-21: 175 mg via INTRAVENOUS
  Filled 2016-09-21: qty 35

## 2016-09-21 MED ORDER — DEXAMETHASONE SODIUM PHOSPHATE 10 MG/ML IJ SOLN
INTRAMUSCULAR | Status: AC
Start: 2016-09-21 — End: 2016-09-21
  Filled 2016-09-21: qty 1

## 2016-09-21 MED ORDER — PALONOSETRON HCL INJECTION 0.25 MG/5ML
0.2500 mg | Freq: Once | INTRAVENOUS | Status: AC
  Administered 2016-09-21: 0.25 mg via INTRAVENOUS

## 2016-09-21 MED ORDER — SODIUM CHLORIDE 0.9% FLUSH
10.0000 mL | INTRAVENOUS | Status: DC | PRN
  Administered 2016-09-21: 10 mL via INTRAVENOUS
  Filled 2016-09-21: qty 10

## 2016-09-21 MED ORDER — DEXAMETHASONE SODIUM PHOSPHATE 10 MG/ML IJ SOLN
10.0000 mg | Freq: Once | INTRAMUSCULAR | Status: AC
  Administered 2016-09-21: 10 mg via INTRAVENOUS

## 2016-09-21 MED ORDER — SODIUM CHLORIDE 0.9 % IV SOLN
2420.0000 mg/m2 | INTRAVENOUS | Status: DC
  Administered 2016-09-21: 5000 mg via INTRAVENOUS
  Filled 2016-09-21: qty 100

## 2016-09-21 MED ORDER — FLUOROURACIL CHEMO INJECTION 2.5 GM/50ML
400.0000 mg/m2 | Freq: Once | INTRAVENOUS | Status: AC
  Administered 2016-09-21: 800 mg via INTRAVENOUS
  Filled 2016-09-21: qty 16

## 2016-09-21 MED ORDER — LEUCOVORIN CALCIUM INJECTION 350 MG
400.0000 mg/m2 | Freq: Once | INTRAVENOUS | Status: AC
  Administered 2016-09-21: 824 mg via INTRAVENOUS
  Filled 2016-09-21: qty 41.2

## 2016-09-21 MED ORDER — PALONOSETRON HCL INJECTION 0.25 MG/5ML
INTRAVENOUS | Status: AC
  Filled 2016-09-21: qty 5

## 2016-09-21 NOTE — Patient Instructions (Addendum)
[facility] Discharge Instructions for Patients Receiving Chemotherapy  Today you received the following chemotherapy agents: Oxaliplatin, Leucovorin, Adrucil   To help prevent nausea and vomiting after your treatment, we encourage you to take your nausea medication as prescribed.  **DO NOT TAKE ZOFRAN FOR 3 DAYS. YOU MAY RE-START ZOFRAN ON Sunday**   If you develop nausea and vomiting that is not controlled by your nausea medication, call the clinic.   BELOW ARE SYMPTOMS THAT SHOULD BE REPORTED IMMEDIATELY:  *FEVER GREATER THAN 100.5 F  *CHILLS WITH OR WITHOUT FEVER  NAUSEA AND VOMITING THAT IS NOT CONTROLLED WITH YOUR NAUSEA MEDICATION  *UNUSUAL SHORTNESS OF BREATH  *UNUSUAL BRUISING OR BLEEDING  TENDERNESS IN MOUTH AND THROAT WITH OR WITHOUT PRESENCE OF ULCERS  *URINARY PROBLEMS  *BOWEL PROBLEMS  UNUSUAL RASH Items with * indicate a potential emergency and should be followed up as soon as possible.  Feel free to call the clinic you have any questions or concerns. The clinic phone number is [phone].  Please show the [facility] at check-in to the Emergency Department and triage nurse.

## 2016-09-21 NOTE — Patient Instructions (Signed)

## 2016-09-21 NOTE — Progress Notes (Signed)
[facility] OFFICE PROGRESS NOTE   Diagnosis: Esophagus cancer  INTERVAL HISTORY:   David [name] returns as scheduled. He completed a first cycle of FOLFOX 09/06/2016. He reports a dry mouth and malaise following chemotherapy. No nausea/vomiting, mouth sores, or neuropathy symptoms. He had one episode of diarrhea. He is eating snacks. The cough has improved. He has exertional dyspnea.  Objective:  Vital signs in last 24 hours:  Blood pressure (!) 149/79, pulse 72, temperature 98.4 F (36.9 C), temperature source Oral, resp. rate 17, height 6\' 3"  (1.905 m), weight 177 lb 8 oz (80.5 kg), SpO2 97 %.    HEENT: No thrush or ulcers Resp: Lungs clear bilaterally, no respiratory distress Cardio: Regular rate and rhythm GI: No hepatosplenomegaly Vascular: Trace low pretibial/ankle edema bilaterally Skin: Palms without erythema   Portacath/PICC-without erythema  Lab Results:  Lab Results  Component Value Date   WBC 3.1 (L) 09/21/2016   HGB 14.3 09/21/2016   HCT 43.2 09/21/2016   MCV 97.5 09/21/2016   PLT 137 (L) 09/21/2016   NEUTROABS 2.4 09/21/2016    CMP     Component Value Date/Time   NA 146 (H) 09/21/2016 1016   K 3.6 09/21/2016 1016   CL 102 09/06/2016 0947   CO2 31 (H) 09/21/2016 1016   GLUCOSE 89 09/21/2016 1016   BUN 20.0 09/21/2016 1016   CREATININE 0.8 09/21/2016 1016   CALCIUM 9.0 09/21/2016 1016   PROT 6.2 (L) 09/21/2016 1016   ALBUMIN 3.1 (L) 09/21/2016 1016   AST 19 09/21/2016 1016   ALT 14 09/21/2016 1016   ALKPHOS 95 09/21/2016 1016   BILITOT 0.69 09/21/2016 1016   GFRNONAA >60 09/06/2016 0947   GFRAA >60 09/06/2016 0947    Imaging:Chest x-ray 09/08/2016-unchanged pleural effusions-images reviewed   Medications: I have reviewed the patient's current medications.  Assessment/Plan: 1. Adenocarcinoma of the distal esophagus,uT3uN2  Presenting with a food impaction and dysphagia 08/20/2014  Staging CT scans of the chest, abdomen, and  pelvis 10/06/2014 with indeterminate pulmonary nodules, a 9 mm paraesophageal lymph node, and circumferential thickening of the distal esophagus  PET scan 10/23/2014 confirmed hypermetabolic thickening at the distal esophagus with small paraesophageal nodes having faint nonspecific activity. A pleural-based focus of consolidation at the posterior right lower lobe had low metabolic activity-favored to be a benign process.  Initiation of concurrent weekly Taxol/carboplatin and radiation on 11/02/2014, radiation completed 12/09/2014  CTs 12/31/2014 revealed distal esophageal wall thickening with adjacent small lymph nodes suspicious for metastases, stable lung nodule posterior to the right bronchus intermedius and the persistent 5 mm nodule in the right lower lobe  Esophagectomy 02/01/2015 with the pathology confirming a ypT3,ypN3 tumor, positive distal margin  CT chest 07/17/2016-bilateral pleural effusions, Bibasilar airspace opacities with peribronchial vascular nodularity, new low-density right liver lesion  Left thoracentesis 07/19/2016-cytology showedreactive mesothelial cells; mixed lymphoid population  Ultrasound-guided biopsy of a segment 8 liver lesion on 08/01/2016-adenocarcinoma  Cycle 1 FOLFOX 09/06/2016  Cycle 2 FOLFOX 09/21/2016  2. Prostatic hypertrophy-Status post TUR 01/26/2016  3. Hypertension  4. History of multiple basal cell skin cancers  5. History of thrombocytopenia secondary to chemotherapy  6. PVCs/bicuspid aortic valve-followed by cardiology  7.  Port-A-Cath placement 09/06/2016    Disposition:  David [name] tolerated the first cycle of FOLFOX well. The plan is to proceed with cycle 2 today. I encouraged him to continue frequent meals and snacks. I do not think he will benefit from vitamin supplements. He will contact us for increased dyspnea.  He will be scheduled for a follow-up chest x-ray when he returns in 2 weeks.  25 minutes were spent  with the patient today. The majority of the time was used for counseling and coordination of care.  Donneta Romberg, MD  09/21/2016  11:40 AM

## 2016-09-21 NOTE — Progress Notes (Signed)
Per Dr Benay Spice ok to leave pump at current rate 5.4 ml/hr, and d/c on Saturday at 1300.

## 2016-09-23 ENCOUNTER — Ambulatory Visit (HOSPITAL_BASED_OUTPATIENT_CLINIC_OR_DEPARTMENT_OTHER): Payer: Self-pay

## 2016-09-23 VITALS — BP 152/77 | HR 71 | Temp 97.8°F | Resp 16

## 2016-09-23 DIAGNOSIS — C155 Malignant neoplasm of lower third of esophagus: Secondary | ICD-10-CM

## 2016-09-23 MED ORDER — HEPARIN SOD (PORK) LOCK FLUSH 100 UNIT/ML IV SOLN
500.0000 [IU] | Freq: Once | INTRAVENOUS | Status: AC | PRN
  Administered 2016-09-23: 500 [IU]
  Filled 2016-09-23: qty 5

## 2016-09-23 MED ORDER — SODIUM CHLORIDE 0.9% FLUSH
10.0000 mL | INTRAVENOUS | Status: DC | PRN
  Administered 2016-09-23: 10 mL
  Filled 2016-09-23: qty 10

## 2016-09-26 ENCOUNTER — Telehealth: Payer: Self-pay | Admitting: Oncology

## 2016-09-26 NOTE — Telephone Encounter (Signed)
Patient bypassed scheduling on 09/21/16. Additional treatment added for 11/02/16. Patient will pick up copy of updated schedule at next visit.

## 2016-10-01 ENCOUNTER — Other Ambulatory Visit: Payer: Self-pay | Admitting: Oncology

## 2016-10-05 ENCOUNTER — Other Ambulatory Visit (HOSPITAL_BASED_OUTPATIENT_CLINIC_OR_DEPARTMENT_OTHER): Payer: Medicare Other

## 2016-10-05 ENCOUNTER — Ambulatory Visit (HOSPITAL_BASED_OUTPATIENT_CLINIC_OR_DEPARTMENT_OTHER): Payer: Medicare Other

## 2016-10-05 ENCOUNTER — Ambulatory Visit: Payer: Medicare Other

## 2016-10-05 ENCOUNTER — Ambulatory Visit (HOSPITAL_BASED_OUTPATIENT_CLINIC_OR_DEPARTMENT_OTHER): Payer: Medicare Other | Admitting: Oncology

## 2016-10-05 ENCOUNTER — Encounter: Payer: Medicare Other | Admitting: Cardiothoracic Surgery

## 2016-10-05 ENCOUNTER — Other Ambulatory Visit: Payer: Self-pay | Admitting: *Deleted

## 2016-10-05 VITALS — BP 158/80 | HR 67

## 2016-10-05 VITALS — BP 184/99 | HR 64 | Temp 97.8°F | Resp 18 | Ht 75.0 in | Wt 178.5 lb

## 2016-10-05 DIAGNOSIS — Z5111 Encounter for antineoplastic chemotherapy: Secondary | ICD-10-CM | POA: Diagnosis not present

## 2016-10-05 DIAGNOSIS — C155 Malignant neoplasm of lower third of esophagus: Secondary | ICD-10-CM | POA: Diagnosis not present

## 2016-10-05 DIAGNOSIS — I10 Essential (primary) hypertension: Secondary | ICD-10-CM

## 2016-10-05 DIAGNOSIS — Z95828 Presence of other vascular implants and grafts: Secondary | ICD-10-CM

## 2016-10-05 LAB — COMPREHENSIVE METABOLIC PANEL
ALBUMIN: 3 g/dL — AB (ref 3.5–5.0)
ALK PHOS: 90 U/L (ref 40–150)
ALT: 18 U/L (ref 0–55)
ANION GAP: 7 meq/L (ref 3–11)
AST: 22 U/L (ref 5–34)
BUN: 19.2 mg/dL (ref 7.0–26.0)
CALCIUM: 8.9 mg/dL (ref 8.4–10.4)
CO2: 31 mEq/L — ABNORMAL HIGH (ref 22–29)
CREATININE: 0.8 mg/dL (ref 0.7–1.3)
Chloride: 107 mEq/L (ref 98–109)
EGFR: 88 mL/min/{1.73_m2} — ABNORMAL LOW (ref >90)
Glucose: 95 mg/dl (ref 70–140)
POTASSIUM: 3.7 meq/L (ref 3.5–5.1)
Sodium: 145 mEq/L (ref 136–145)
Total Bilirubin: 0.73 mg/dL (ref 0.20–1.20)
Total Protein: 6.1 g/dL — ABNORMAL LOW (ref 6.4–8.3)

## 2016-10-05 LAB — CBC WITH DIFFERENTIAL/PLATELET
BASO%: 0.6 % (ref 0.0–2.0)
BASOS ABS: 0 10*3/uL (ref 0.0–0.1)
EOS ABS: 0.1 10*3/uL (ref 0.0–0.5)
EOS%: 1.7 % (ref 0.0–7.0)
HEMATOCRIT: 42.3 % (ref 38.4–49.9)
HEMOGLOBIN: 13.8 g/dL (ref 13.0–17.1)
LYMPH#: 0.4 10*3/uL — AB (ref 0.9–3.3)
LYMPH%: 11 % — ABNORMAL LOW (ref 14.0–49.0)
MCH: 32.4 pg (ref 27.2–33.4)
MCHC: 32.6 g/dL (ref 32.0–36.0)
MCV: 99.3 fL — ABNORMAL HIGH (ref 79.3–98.0)
MONO#: 0.5 10*3/uL (ref 0.1–0.9)
MONO%: 13.3 % (ref 0.0–14.0)
NEUT#: 2.6 10*3/uL (ref 1.5–6.5)
NEUT%: 73.4 % (ref 39.0–75.0)
PLATELETS: 110 10*3/uL — AB (ref 140–400)
RBC: 4.26 10*6/uL (ref 4.20–5.82)
RDW: 14.2 % (ref 11.0–14.6)
WBC: 3.5 10*3/uL — ABNORMAL LOW (ref 4.0–10.3)

## 2016-10-05 MED ORDER — PALONOSETRON HCL INJECTION 0.25 MG/5ML
0.2500 mg | Freq: Once | INTRAVENOUS | Status: AC
  Administered 2016-10-05: 0.25 mg via INTRAVENOUS

## 2016-10-05 MED ORDER — DEXAMETHASONE SODIUM PHOSPHATE 10 MG/ML IJ SOLN
10.0000 mg | Freq: Once | INTRAMUSCULAR | Status: AC
  Administered 2016-10-05: 10 mg via INTRAVENOUS

## 2016-10-05 MED ORDER — PALONOSETRON HCL INJECTION 0.25 MG/5ML
INTRAVENOUS | Status: AC
  Filled 2016-10-05: qty 5

## 2016-10-05 MED ORDER — LEUCOVORIN CALCIUM INJECTION 350 MG
400.0000 mg/m2 | Freq: Once | INTRAVENOUS | Status: AC
  Administered 2016-10-05: 824 mg via INTRAVENOUS
  Filled 2016-10-05: qty 41.2

## 2016-10-05 MED ORDER — DEXTROSE 5 % IV SOLN
Freq: Once | INTRAVENOUS | Status: AC
  Administered 2016-10-05: 11:00:00 via INTRAVENOUS

## 2016-10-05 MED ORDER — FLUOROURACIL CHEMO INJECTION 5 GM/100ML
2425.0000 mg/m2 | INTRAVENOUS | Status: DC
  Administered 2016-10-05: 5000 mg via INTRAVENOUS
  Filled 2016-10-05: qty 100

## 2016-10-05 MED ORDER — FLUOROURACIL CHEMO INJECTION 2.5 GM/50ML
400.0000 mg/m2 | Freq: Once | INTRAVENOUS | Status: AC
  Administered 2016-10-05: 800 mg via INTRAVENOUS
  Filled 2016-10-05: qty 16

## 2016-10-05 MED ORDER — OXALIPLATIN CHEMO INJECTION 100 MG/20ML
85.0000 mg/m2 | Freq: Once | INTRAVENOUS | Status: AC
  Administered 2016-10-05: 175 mg via INTRAVENOUS
  Filled 2016-10-05: qty 35

## 2016-10-05 MED ORDER — DEXAMETHASONE SODIUM PHOSPHATE 10 MG/ML IJ SOLN
INTRAMUSCULAR | Status: AC
  Filled 2016-10-05: qty 1

## 2016-10-05 MED ORDER — SODIUM CHLORIDE 0.9% FLUSH
10.0000 mL | INTRAVENOUS | Status: AC | PRN
  Administered 2016-10-05: 10 mL via INTRAVENOUS
  Filled 2016-10-05: qty 10

## 2016-10-05 NOTE — Patient Instructions (Signed)
[facility] Discharge Instructions for Patients Receiving Chemotherapy  Today you received the following chemotherapy agents oxaliplatin/leucovorin/florouracil   To help prevent nausea and vomiting after your treatment, we encourage you to take your nausea medication as directed.   If you develop nausea and vomiting that is not controlled by your nausea medication, call the clinic.   BELOW ARE SYMPTOMS THAT SHOULD BE REPORTED IMMEDIATELY:  *FEVER GREATER THAN 100.5 F  *CHILLS WITH OR WITHOUT FEVER  NAUSEA AND VOMITING THAT IS NOT CONTROLLED WITH YOUR NAUSEA MEDICATION  *UNUSUAL SHORTNESS OF BREATH  *UNUSUAL BRUISING OR BLEEDING  TENDERNESS IN MOUTH AND THROAT WITH OR WITHOUT PRESENCE OF ULCERS  *URINARY PROBLEMS  *BOWEL PROBLEMS  UNUSUAL RASH Items with * indicate a potential emergency and should be followed up as soon as possible.  Feel free to call the clinic you have any questions or concerns. The clinic phone number is [phone].  

## 2016-10-05 NOTE — Patient Instructions (Signed)
Implanted Port Home Guide An implanted port is a type of central line that is placed under the skin. Central lines are used to provide IV access when treatment or nutrition needs to be given through a person's veins. Implanted ports are used for long-term IV access. An implanted port may be placed because:  You need IV medicine that would be irritating to the small veins in your hands or arms.  You need long-term IV medicines, such as antibiotics.  You need IV nutrition for a long period.  You need frequent blood draws for lab tests.  You need dialysis.  Implanted ports are usually placed in the chest area, but they can also be placed in the upper arm, the abdomen, or the leg. An implanted port has two main parts:  Reservoir. The reservoir is round and will appear as a small, raised area under your skin. The reservoir is the part where a needle is inserted to give medicines or draw blood.  Catheter. The catheter is a thin, flexible tube that extends from the reservoir. The catheter is placed into a large vein. Medicine that is inserted into the reservoir goes into the catheter and then into the vein.  How will I care for my incision site? Do not get the incision site wet. Bathe or shower as directed by your health care provider. How is my port accessed? Special steps must be taken to access the port:  Before the port is accessed, a numbing cream can be placed on the skin. This helps numb the skin over the port site.  Your health care provider uses a sterile technique to access the port. ? Your health care provider must put on a mask and sterile gloves. ? The skin over your port is cleaned carefully with an antiseptic and allowed to dry. ? The port is gently pinched between sterile gloves, and a needle is inserted into the port.  Only "non-coring" port needles should be used to access the port. Once the port is accessed, a blood return should be checked. This helps ensure that the port  is in the vein and is not clogged.  If your port needs to remain accessed for a constant infusion, a clear (transparent) bandage will be placed over the needle site. The bandage and needle will need to be changed every week, or as directed by your health care provider.  Keep the bandage covering the needle clean and dry. Do not get it wet. Follow your health care provider's instructions on how to take a shower or bath while the port is accessed.  If your port does not need to stay accessed, no bandage is needed over the port.  What is flushing? Flushing helps keep the port from getting clogged. Follow your health care provider's instructions on how and when to flush the port. Ports are usually flushed with saline solution or a medicine called heparin. The need for flushing will depend on how the port is used.  If the port is used for intermittent medicines or blood draws, the port will need to be flushed: ? After medicines have been given. ? After blood has been drawn. ? As part of routine maintenance.  If a constant infusion is running, the port may not need to be flushed.  How long will my port stay implanted? The port can stay in for as long as your health care provider thinks it is needed. When it is time for the port to come out, surgery will be   done to remove it. The procedure is similar to the one performed when the port was put in. When should I seek immediate medical care? When you have an implanted port, you should seek immediate medical care if:  You notice a bad smell coming from the incision site.  You have swelling, redness, or drainage at the incision site.  You have more swelling or pain at the port site or the surrounding area.  You have a fever that is not controlled with medicine.  This information is not intended to replace advice given to you by your health care provider. Make sure you discuss any questions you have with your health care provider. Document  Released: 04/10/2005 Document Revised: 09/16/2015 Document Reviewed: 12/16/2012 Elsevier Interactive Patient Education  2017 [employer]  

## 2016-10-05 NOTE — Progress Notes (Signed)
[facility] OFFICE PROGRESS NOTE   Diagnosis: Esophagus cancer  INTERVAL HISTORY:   David [name] returns as scheduled. He completed another cycle of FOLFOX 09/21/2016. No nausea/vomiting, mouth sores, or diarrhea. No neuropathy symptoms. He reports stable exertional dyspnea. The cough has improved.  Objective:  Vital signs in last 24 hours:  Blood pressure (!) 184/99, pulse 64, temperature 97.8 F (36.6 C), temperature source Oral, resp. rate 18, height 6\' 3"  (1.905 m), weight 178 lb 8 oz (81 kg).    HEENT: No thrush or ulcers Resp: Lungs with decreased breath sounds at the lower chest, no respiratory distress Cardio: Regular rate and rhythm GI: No hepatosplenomegaly Vascular: Trace edema at the left lower leg and ankle  Skin: Palms without erythema   Portacath/PICC-without erythema  Lab Results:  Lab Results  Component Value Date   WBC 3.5 (L) 10/05/2016   HGB 13.8 10/05/2016   HCT 42.3 10/05/2016   MCV 99.3 (H) 10/05/2016   PLT 110 (L) 10/05/2016   NEUTROABS 2.6 10/05/2016    CMP     Component Value Date/Time   NA 145 10/05/2016 0929   K 3.7 10/05/2016 0929   CL 102 09/06/2016 0947   CO2 31 (H) 10/05/2016 0929   GLUCOSE 95 10/05/2016 0929   BUN 19.2 10/05/2016 0929   CREATININE 0.8 10/05/2016 0929   CALCIUM 8.9 10/05/2016 0929   PROT 6.1 (L) 10/05/2016 0929   ALBUMIN 3.0 (L) 10/05/2016 0929   AST 22 10/05/2016 0929   ALT 18 10/05/2016 0929   ALKPHOS 90 10/05/2016 0929   BILITOT 0.73 10/05/2016 0929   GFRNONAA >60 09/06/2016 0947   GFRAA >60 09/06/2016 0947    Lab Results  Component Value Date   CEA1 3.73 09/06/2016     Medications: I have reviewed the patient's current medications.  Assessment/Plan: 1. Adenocarcinoma of the distal esophagus,uT3uN2  Presenting with a food impaction and dysphagia 08/20/2014  Staging CT scans of the chest, abdomen, and pelvis 10/06/2014 with indeterminate pulmonary nodules, a 9 mm paraesophageal lymph  node, and circumferential thickening of the distal esophagus  PET scan 10/23/2014 confirmed hypermetabolic thickening at the distal esophagus with small paraesophageal nodes having faint nonspecific activity. A pleural-based focus of consolidation at the posterior right lower lobe had low metabolic activity-favored to be a benign process.  Initiation of concurrent weekly Taxol/carboplatin and radiation on 11/02/2014, radiation completed 12/09/2014  CTs 12/31/2014 revealed distal esophageal wall thickening with adjacent small lymph nodes suspicious for metastases, stable lung nodule posterior to the right bronchus intermedius and the persistent 5 mm nodule in the right lower lobe  Esophagectomy 02/01/2015 with the pathology confirming a ypT3,ypN3 tumor, positive distal margin  CT chest 07/17/2016-bilateral pleural effusions, Bibasilar airspace opacities with peribronchial vascular nodularity, new low-density right liver lesion  Left thoracentesis 07/19/2016-cytology showedreactive mesothelial cells; mixed lymphoid population  Ultrasound-guided biopsy of a segment 8 liver lesion on 08/01/2016-adenocarcinoma  Cycle 1 FOLFOX 09/06/2016  Cycle 2 FOLFOX 09/21/2016  Cycle 3 FOLFOX 10/05/2016  2. Prostatic hypertrophy-Status post TUR 01/26/2016  3. Hypertension  4. History of multiple basal cell skin cancers  5. History of thrombocytopenia secondary to chemotherapy  6. PVCs/bicuspid aortic valve-followed by cardiology  7. Port-A-Cath placement 09/06/2016   Disposition:  David [name] appears stable. He is tolerating the FOLFOX well. He will complete cycle 3 today. He will return for an office visit and chemotherapy in 2 weeks. We will schedule a chest x-ray to follow-up on the pleural effusions when he is  here 2 weeks.  Donneta Romberg, MD  10/05/2016  10:13 AM

## 2016-10-07 ENCOUNTER — Ambulatory Visit (HOSPITAL_BASED_OUTPATIENT_CLINIC_OR_DEPARTMENT_OTHER): Payer: Self-pay

## 2016-10-07 VITALS — BP 141/82 | HR 73 | Temp 96.8°F | Resp 18

## 2016-10-07 DIAGNOSIS — C155 Malignant neoplasm of lower third of esophagus: Secondary | ICD-10-CM

## 2016-10-07 MED ORDER — HEPARIN SOD (PORK) LOCK FLUSH 100 UNIT/ML IV SOLN
500.0000 [IU] | Freq: Once | INTRAVENOUS | Status: AC | PRN
  Administered 2016-10-07: 500 [IU]
  Filled 2016-10-07: qty 5

## 2016-10-07 MED ORDER — SODIUM CHLORIDE 0.9% FLUSH
10.0000 mL | INTRAVENOUS | Status: DC | PRN
  Administered 2016-10-07: 10 mL
  Filled 2016-10-07: qty 10

## 2016-10-07 NOTE — Patient Instructions (Signed)
Implanted Port Home Guide An implanted port is a type of central line that is placed under the skin. Central lines are used to provide IV access when treatment or nutrition needs to be given through a person's veins. Implanted ports are used for long-term IV access. An implanted port may be placed because:  You need IV medicine that would be irritating to the small veins in your hands or arms.  You need long-term IV medicines, such as antibiotics.  You need IV nutrition for a long period.  You need frequent blood draws for lab tests.  You need dialysis.  Implanted ports are usually placed in the chest area, but they can also be placed in the upper arm, the abdomen, or the leg. An implanted port has two main parts:  Reservoir. The reservoir is round and will appear as a small, raised area under your skin. The reservoir is the part where a needle is inserted to give medicines or draw blood.  Catheter. The catheter is a thin, flexible tube that extends from the reservoir. The catheter is placed into a large vein. Medicine that is inserted into the reservoir goes into the catheter and then into the vein.  How will I care for my incision site? Do not get the incision site wet. Bathe or shower as directed by your health care provider. How is my port accessed? Special steps must be taken to access the port:  Before the port is accessed, a numbing cream can be placed on the skin. This helps numb the skin over the port site.  Your health care provider uses a sterile technique to access the port. ? Your health care provider must put on a mask and sterile gloves. ? The skin over your port is cleaned carefully with an antiseptic and allowed to dry. ? The port is gently pinched between sterile gloves, and a needle is inserted into the port.  Only "non-coring" port needles should be used to access the port. Once the port is accessed, a blood return should be checked. This helps ensure that the port  is in the vein and is not clogged.  If your port needs to remain accessed for a constant infusion, a clear (transparent) bandage will be placed over the needle site. The bandage and needle will need to be changed every week, or as directed by your health care provider.  Keep the bandage covering the needle clean and dry. Do not get it wet. Follow your health care provider's instructions on how to take a shower or bath while the port is accessed.  If your port does not need to stay accessed, no bandage is needed over the port.  What is flushing? Flushing helps keep the port from getting clogged. Follow your health care provider's instructions on how and when to flush the port. Ports are usually flushed with saline solution or a medicine called heparin. The need for flushing will depend on how the port is used.  If the port is used for intermittent medicines or blood draws, the port will need to be flushed: ? After medicines have been given. ? After blood has been drawn. ? As part of routine maintenance.  If a constant infusion is running, the port may not need to be flushed.  How long will my port stay implanted? The port can stay in for as long as your health care provider thinks it is needed. When it is time for the port to come out, surgery will be   done to remove it. The procedure is similar to the one performed when the port was put in. When should I seek immediate medical care? When you have an implanted port, you should seek immediate medical care if:  You notice a bad smell coming from the incision site.  You have swelling, redness, or drainage at the incision site.  You have more swelling or pain at the port site or the surrounding area.  You have a fever that is not controlled with medicine.  This information is not intended to replace advice given to you by your health care provider. Make sure you discuss any questions you have with your health care provider. Document  Released: 04/10/2005 Document Revised: 09/16/2015 Document Reviewed: 12/16/2012 Elsevier Interactive Patient Education  2017 [employer]  

## 2016-10-10 ENCOUNTER — Other Ambulatory Visit: Payer: Self-pay | Admitting: Internal Medicine

## 2016-10-11 ENCOUNTER — Telehealth: Payer: Self-pay

## 2016-10-11 ENCOUNTER — Telehealth: Payer: Self-pay | Admitting: Oncology

## 2016-10-11 NOTE — Telephone Encounter (Signed)
Returned call to pt per sch msg. No answer. lvm for pt to call back re changing appts

## 2016-10-11 NOTE — Telephone Encounter (Signed)
Pt called requesting appointments on 10/19/16 to be rescheduled to 10/23/16 and pump d/c from 10/21/16 to 10/25/16. Per Dr. Benay Spice okay to reschedule appts and pt will see Ned Card NP instead of Dr. Benay Spice. Message sent to scheduling for appts to be rescheduled and to call pt with new appts.

## 2016-10-12 ENCOUNTER — Telehealth: Payer: Self-pay

## 2016-10-12 ENCOUNTER — Ambulatory Visit
Admission: RE | Admit: 2016-10-12 | Discharge: 2016-10-12 | Disposition: A | Discharge status: Home / Self Care | Payer: Medicare Other | Source: Ambulatory Visit | Attending: Cardiothoracic Surgery | Admitting: Cardiothoracic Surgery

## 2016-10-12 ENCOUNTER — Other Ambulatory Visit: Payer: Self-pay | Admitting: *Deleted

## 2016-10-12 ENCOUNTER — Encounter: Payer: Self-pay | Admitting: Cardiothoracic Surgery

## 2016-10-12 ENCOUNTER — Ambulatory Visit (INDEPENDENT_AMBULATORY_CARE_PROVIDER_SITE_OTHER): Payer: Medicare Other | Admitting: Cardiothoracic Surgery

## 2016-10-12 ENCOUNTER — Telehealth: Payer: Self-pay | Admitting: Oncology

## 2016-10-12 VITALS — BP 154/94 | Resp 16 | Ht 75.0 in | Wt 176.4 lb

## 2016-10-12 DIAGNOSIS — Z09 Encounter for follow-up examination after completed treatment for conditions other than malignant neoplasm: Secondary | ICD-10-CM | POA: Diagnosis not present

## 2016-10-12 DIAGNOSIS — J90 Pleural effusion, not elsewhere classified: Secondary | ICD-10-CM

## 2016-10-12 DIAGNOSIS — C160 Malignant neoplasm of cardia: Secondary | ICD-10-CM

## 2016-10-12 NOTE — Telephone Encounter (Signed)
Pt was calling to see if his appt was made for 7/2. Went over times.

## 2016-10-12 NOTE — Telephone Encounter (Signed)
lvm to inform pt of r/s 6/28 appt to 7/2 per [place] msg

## 2016-10-12 NOTE — Progress Notes (Signed)
[address]       [place] [address]             [phone]      David [name]  Medical Record #[id number] Date of Birth: November 01, 1940  Referring: Ladell Pier, MD Primary Care: Marton Redwood, MD  Chief Complaint:   POST OP FOLLOW UP  History of Present Illness:     Cancer of lower third of esophagus [facility])   Staging form: Esophagus - Squamous Cell Carcinoma, AJCC 7th Edition     Clinical: Stage IIIB (T3, N2, M0) - Signed by Ladell Pier, MD on 10/19/2014     Pathologic stage from 02/01/2015: Stage IIIC (T3, N3, cM0, G4 - Undifferentiated, Location: Middle) - Signed by Grace Isaac, MD on 02/04/2015  02/01/2015 Operative  REPORT PREOPERATIVE DIAGNOSIS: 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 SURGICAL PROCEDURE: Transhiatal total esophagectomy with cervical esophagogastrostomy, pyloromyotomy and feeding jejunostomy tube. SURGEON: Lanelle Bal, MD  Patient  Now 20 months post op. In March 2018 the patient had a CT scan demonstrating increasing left pleural effusion and liver metastasis. He is currently in on his third cycle of FOLFOX. He notes this is making him feel very poorly. 1.3 L of fluid was removed from his left chest in March, last chest x-ray a month ago was stable.  Wt Readings from Last 3 Encounters:  10/12/16 176 lb 6.4 oz (80 kg)  10/05/16 178 lb 8 oz (81 kg)  09/21/16 177 lb 8 oz (80.5 kg)     Past Medical History:  Diagnosis Date  . Allergy   . Anxiety   . Arthritis   . [name]'s esophagus   . Basal cell carcinoma    nose, face, back  . Dysrhythmia   . Enlarged prostate   . Esophageal cancer ([place])   . Food impaction of esophagus 08/20/2014  . GERD (gastroesophageal reflux disease)    needs 30* elevation since  total esophagectomy -uses wedge at home  . Hypertension   . Kidney stone   . Radiation   . Urinary frequency   . Wears glasses      History  Smoking Status  . Former Smoker  . Quit date: 06/07/1961  Smokeless Tobacco  . Never Used    History  Alcohol Use No     Allergies  Allergen Reactions  . Dilaudid [Hydromorphone Hcl] Hives and Itching  . Fentanyl Itching  . Nexium [Esomeprazole Magnesium] Other (See Comments)    Difficulty urinating and passing stool; dry mouth  . Other     Thinks it was oxycodone; made him hallucinate  . Oxycodone Nausea And Vomiting  . Statins Other (See Comments)    Leg cramping    Current Outpatient Prescriptions  Medication Sig Dispense Refill  . ALPRAZolam (XANAX) 0.5 MG tablet Take 0.5 mg by mouth at bedtime as needed for sleep.   4  . b complex vitamins capsule Take 1 capsule by mouth daily.    . cyproheptadine (PERIACTIN) 2 MG/5ML syrup Take by mouth 3 (three) times daily. 10 ml prior to each meal    . flecainide (TAMBOCOR) 100 MG tablet TAKE 1 TABLET (100 MG TOTAL) BY MOUTH 2 (TWO) TIMES DAILY. 60 tablet 8  . HYDROcodone-acetaminophen (NORCO/VICODIN) 5-325 MG tablet Take 1 tablet by mouth every 6 (six) hours as needed for moderate  pain.    . lidocaine-prilocaine (EMLA) cream Apply to port site one hour prior to use. Do not rub in. Cover with plastic. 30 g 0  . metoprolol succinate (TOPROL-XL) 50 MG 24 hr tablet Take 50 mg by mouth daily.    . Multiple Vitamin (MULTIVITAMIN) tablet Take 1 tablet by mouth daily.    . ondansetron (ZOFRAN) 8 MG tablet Take 1 tablet (8 mg total) by mouth every 8 (eight) hours as needed for nausea or vomiting. 30 tablet 2  . pantoprazole (PROTONIX) 20 MG tablet Take 1 tablet (20 mg total) by mouth daily. 30 tablet 1  . prochlorperazine (COMPAZINE) 10 MG tablet Take 10 mg by mouth every 6 (six) hours as needed for nausea or vomiting.     No current facility-administered medications for this visit.     Facility-Administered Medications Ordered in Other Visits  Medication Dose Route Frequency Provider Last Rate Last Dose  . sodium chloride flush (NS) 0.9 % injection 10 mL  10 mL Intravenous PRN Ladell Pier, MD   10 mL at 10/05/16 0933       Physical Exam: BP (!) 154/94 (BP Location: Right Arm, Patient Position: Sitting, Cuff Size: Normal)   Resp 16   Ht 6\' 3"  (1.905 m)   Wt 176 lb 6.4 oz (80 kg)   SpO2 93% Comment: ON RA  BMI 22.05 kg/m   General appearance: Appears fatigued cooperative , not is minimally sharp as usual Neurologic: intact Heart: regular rate and rhythm, S1, S2 normal, no murmur, click, rub or gallop Lungs: clear to auscultation on right, slightly decreased breath sounds left base Abdomen: soft, non-tender; bowel sounds normal; no masses,  no organomegaly Extremities: extremities normal, atraumatic, no cyanosis or edema and Homans sign is negative, no sign of DVT Wound: Abdominal left neck incision are well-healed without signs of infection, no cervical supraclavicular or axillary adenopathy he has no evidence of incisional hernias   Diagnostic Studies & Laboratory data:     Recent Radiology Findings:  Dg Chest 2 View  Result Date: 10/12/2016 CLINICAL DATA:  Shortness of breath.  Pleural effusion . EXAM: CHEST  2 VIEW COMPARISON:  09/08/2016 . FINDINGS: PowerPort catheter with lead tip projected over the cavoatrial junction. Stable cardiomegaly. Stable bibasilar mild pulmonary infiltrates/edema. Stable bilateral pleural effusions, left side greater than. IMPRESSION: 1. PowerPort catheter noted with lead tip projected over the cavoatrial junction. 2. Stable bibasilar pulmonary infiltrates and bilateral pleural effusions, left side greater than right. 3. Stable cardiomegaly . Electronically Signed   By: Marcello Moores  Register   On: 10/12/2016 15:45   I have independently reviewed the above radiology studies  and reviewed the findings with the patient.  Study  Result   CLINICAL DATA:  Short of breath on exertion. Back pain and dry cough. Evaluate pleural effusion. History of esophageal cancer with surgery, chemotherapy, and radiation therapy 2 years ago.  EXAM: CT CHEST WITH CONTRAST  TECHNIQUE: Multidetector CT imaging of the chest was performed during intravenous contrast administration.  CONTRAST:  18mL ISOVUE-300 IOPAMIDOL (ISOVUE-300) INJECTION 61%  COMPARISON:  12/31/2014 chest CT.  CT stone study 01/07/2016.  FINDINGS: Cardiovascular: Tortuous thoracic aorta. Aortic and branch vessel atherosclerosis. Mild cardiomegaly with multivessel coronary artery atherosclerosis. No central pulmonary embolism, on this non-dedicated study.  Mediastinum/Nodes: No supraclavicular adenopathy. No mediastinal or hilar adenopathy. Surgical changes of gastric pull-through and esophagectomy.  Lungs/Pleura: Moderate, left larger than right pleural effusions. Bibasilar dependent pulmonary opacities. Relatively diffuse peribronchovascular nodularity. Many these nodules  have a "Tree-in-bud" appearance. Some nodules are larger, including within the inferior right middle lobe at 8 mm on image 122/series 5.  Upper Abdomen: Subtle hypoattenuating 2.8 cm right hepatic lobe lesion on image 138/series 2, new since 12/29/2014. Normal imaged portions of the spleen, pancreas, adrenal glands.  Musculoskeletal: Moderate thoracic spondylosis.  IMPRESSION: 1. Bibasilar airspace opacities and more diffuse peribronchovascular nodularity with many nodules demonstrating a "Tree-in-bud" morphology. This favors infectious bronchiolitis, including atypical etiologies. Larger pulmonary nodules are indeterminate and could be infectious as well. Concurrent pulmonary metastasis cannot be excluded. CT follow-up after antibiotic therapy suggested. 2. Left larger than right pleural effusions. 3. Right hepatic lobe low-density lesion is new since  12/31/2014, suspicious for metastatic disease. Consider dedicated abdominal imaging with pre and post contrast abdominal MRI versus complete restaging with PET. 4. Status post esophagectomy and gastric pull through. 5.  Coronary artery atherosclerosis. Aortic atherosclerosis.   Electronically Signed   By: Abigail Miyamoto M.D.   On: 07/17/2016 12:37      Recent Lab Findings: Lab Results  Component Value Date   WBC 3.5 (L) 10/05/2016   HGB 13.8 10/05/2016   HCT 42.3 10/05/2016   PLT 110 (L) 10/05/2016   GLUCOSE 95 10/05/2016   ALT 18 10/05/2016   AST 22 10/05/2016   NA 145 10/05/2016   K 3.7 10/05/2016   CL 102 09/06/2016   CREATININE 0.8 10/05/2016   BUN 19.2 10/05/2016   CO2 31 (H) 10/05/2016   INR 1.08 09/06/2016     Assessment / Plan:    Patient with stage IV esophageal cancer now undergoing FOLFOX treatment, with fatigue  Chest x-ray Shows stable Small pleural effusions Left greater than right PlanTo see the patient back in three months Follow up Chest x-ray   Grace Isaac MD      [address] [place] [address] Office [phone]   Beeper [phone]  10/12/2016 8:05 PM

## 2016-10-19 ENCOUNTER — Other Ambulatory Visit: Payer: Medicare Other

## 2016-10-19 ENCOUNTER — Ambulatory Visit: Payer: Medicare Other

## 2016-10-19 ENCOUNTER — Telehealth: Payer: Self-pay | Admitting: *Deleted

## 2016-10-19 ENCOUNTER — Ambulatory Visit: Payer: Medicare Other | Admitting: Oncology

## 2016-10-19 NOTE — Telephone Encounter (Signed)
Returned call to patient after receipt of voicemail of a missed call.  Informed him of appointments on 10-23-2016.  begining at 0900.  Encouraged to arrive thirty minutes early for registration.  Denies further questions.

## 2016-10-20 ENCOUNTER — Other Ambulatory Visit: Payer: Self-pay | Admitting: *Deleted

## 2016-10-20 DIAGNOSIS — C155 Malignant neoplasm of lower third of esophagus: Secondary | ICD-10-CM

## 2016-10-22 ENCOUNTER — Other Ambulatory Visit: Payer: Self-pay | Admitting: Oncology

## 2016-10-23 ENCOUNTER — Ambulatory Visit (HOSPITAL_BASED_OUTPATIENT_CLINIC_OR_DEPARTMENT_OTHER): Payer: Medicare Other | Admitting: Nurse Practitioner

## 2016-10-23 ENCOUNTER — Ambulatory Visit: Payer: Medicare Other

## 2016-10-23 ENCOUNTER — Telehealth: Payer: Self-pay | Admitting: Nurse Practitioner

## 2016-10-23 ENCOUNTER — Other Ambulatory Visit: Payer: Self-pay | Admitting: Oncology

## 2016-10-23 ENCOUNTER — Other Ambulatory Visit (HOSPITAL_BASED_OUTPATIENT_CLINIC_OR_DEPARTMENT_OTHER): Payer: Medicare Other

## 2016-10-23 VITALS — BP 161/72 | HR 70 | Temp 97.9°F | Resp 20 | Ht 75.0 in | Wt 180.2 lb

## 2016-10-23 DIAGNOSIS — C155 Malignant neoplasm of lower third of esophagus: Secondary | ICD-10-CM

## 2016-10-23 DIAGNOSIS — I10 Essential (primary) hypertension: Secondary | ICD-10-CM

## 2016-10-23 LAB — COMPREHENSIVE METABOLIC PANEL
ALT: 17 U/L (ref 0–55)
ANION GAP: 8 meq/L (ref 3–11)
AST: 22 U/L (ref 5–34)
Albumin: 3 g/dL — ABNORMAL LOW (ref 3.5–5.0)
Alkaline Phosphatase: 87 U/L (ref 40–150)
BILIRUBIN TOTAL: 0.62 mg/dL (ref 0.20–1.20)
BUN: 17.9 mg/dL (ref 7.0–26.0)
CALCIUM: 9.1 mg/dL (ref 8.4–10.4)
CHLORIDE: 107 meq/L (ref 98–109)
CO2: 29 mEq/L (ref 22–29)
CREATININE: 0.8 mg/dL (ref 0.7–1.3)
EGFR: 87 mL/min/{1.73_m2} — ABNORMAL LOW (ref >90)
Glucose: 113 mg/dl (ref 70–140)
Potassium: 3.5 mEq/L (ref 3.5–5.1)
Sodium: 144 mEq/L (ref 136–145)
TOTAL PROTEIN: 6.2 g/dL — AB (ref 6.4–8.3)

## 2016-10-23 LAB — CBC WITH DIFFERENTIAL/PLATELET
BASO%: 1.3 % (ref 0.0–2.0)
Basophils Absolute: 0 10*3/uL (ref 0.0–0.1)
EOS%: 2.4 % (ref 0.0–7.0)
Eosinophils Absolute: 0 10*3/uL (ref 0.0–0.5)
HEMATOCRIT: 40.4 % (ref 38.4–49.9)
HEMOGLOBIN: 13.6 g/dL (ref 13.0–17.1)
LYMPH#: 0.3 10*3/uL — AB (ref 0.9–3.3)
LYMPH%: 18.2 % (ref 14.0–49.0)
MCH: 33 pg (ref 27.2–33.4)
MCHC: 33.7 g/dL (ref 32.0–36.0)
MCV: 97.9 fL (ref 79.3–98.0)
MONO#: 0.4 10*3/uL (ref 0.1–0.9)
MONO%: 21.7 % — ABNORMAL HIGH (ref 0.0–14.0)
NEUT%: 56.4 % (ref 39.0–75.0)
NEUTROS ABS: 1.1 10*3/uL — AB (ref 1.5–6.5)
PLATELETS: 136 10*3/uL — AB (ref 140–400)
RBC: 4.12 10*6/uL — ABNORMAL LOW (ref 4.20–5.82)
RDW: 15.6 % — AB (ref 11.0–14.6)
WBC: 1.9 10*3/uL — ABNORMAL LOW (ref 4.0–10.3)

## 2016-10-23 MED ORDER — SODIUM CHLORIDE 0.9% FLUSH
10.0000 mL | INTRAVENOUS | Status: DC | PRN
  Administered 2016-10-23: 10 mL via INTRAVENOUS
  Filled 2016-10-23: qty 10

## 2016-10-23 MED ORDER — HEPARIN SOD (PORK) LOCK FLUSH 100 UNIT/ML IV SOLN
500.0000 [IU] | Freq: Once | INTRAVENOUS | Status: AC
Start: 2016-10-23 — End: 2016-10-23
  Administered 2016-10-23: 500 [IU] via INTRAVENOUS
  Filled 2016-10-23: qty 5

## 2016-10-23 MED ORDER — SODIUM CHLORIDE 0.9% FLUSH
10.0000 mL | Freq: Once | INTRAVENOUS | Status: AC
  Administered 2016-10-23: 10 mL via INTRAVENOUS
  Filled 2016-10-23: qty 10

## 2016-10-23 MED ORDER — HYDROCODONE-ACETAMINOPHEN 5-325 MG PO TABS: 1.0000 | ORAL_TABLET | Freq: Four times a day (QID) | ORAL | 0 refills | Status: DC | PRN

## 2016-10-23 NOTE — Progress Notes (Signed)
[facility] OFFICE PROGRESS NOTE   Diagnosis:  Esophagus cancer  INTERVAL HISTORY:   David [name] returns as scheduled. He completed cycle 3 FOLFOX 10/05/2016. He denies nausea/vomiting. No mouth sores. No diarrhea. He did not experience cold sensitivity. No numbness or tingling in his hands or feet. He has stable dyspnea on exertion. Main complaint is fatigue. He intermittently notes a dry mouth. He occasionally takes hydrocodone for low back pain and would like a refill.  Objective:  Vital signs in last 24 hours:  Blood pressure (!) 161/72, pulse 78, temperature 97.9 F (36.6 C), weight 180 lb 2 oz (81.7 kg).    HEENT: No thrush or ulcers. Resp: Breath sounds diminished at the left lower lung field. No respiratory distress. Cardio: Regular rate and rhythm. GI: Abdomen soft and nontender. No hepatomegaly. Vascular: No leg edema. Neuro: Vibratory sense mildly decreased over the fingertips per tuning fork exam.  Skin: Palms mildly dry. No erythema. Port-A-Cath without erythema.    Lab Results:  Lab Results  Component Value Date   WBC 1.9 (L) 10/23/2016   HGB 13.6 10/23/2016   HCT 40.4 10/23/2016   MCV 97.9 10/23/2016   PLT 136 (L) 10/23/2016   NEUTROABS 1.1 (L) 10/23/2016    Imaging:  No results found.  Medications: I have reviewed the patient's current medications.  Assessment/Plan: 1. Adenocarcinoma of the distal esophagus,uT3uN2  Presenting with a food impaction and dysphagia 08/20/2014  Staging CT scans of the chest, abdomen, and pelvis 10/06/2014 with indeterminate pulmonary nodules, a 9 mm paraesophageal lymph node, and circumferential thickening of the distal esophagus  PET scan 10/23/2014 confirmed hypermetabolic thickening at the distal esophagus with small paraesophageal nodes having faint nonspecific activity. A pleural-based focus of consolidation at the posterior right lower lobe had low metabolic activity-favored to be a benign  process.  Initiation of concurrent weekly Taxol/carboplatin and radiation on 11/02/2014, radiation completed 12/09/2014  CTs 12/31/2014 revealed distal esophageal wall thickening with adjacent small lymph nodes suspicious for metastases, stable lung nodule posterior to the right bronchus intermedius and the persistent 5 mm nodule in the right lower lobe  Esophagectomy 02/01/2015 with the pathology confirming a ypT3,ypN3 tumor, positive distal margin  CT chest 07/17/2016-bilateral pleural effusions, Bibasilar airspace opacities with peribronchial vascular nodularity, new low-density right liver lesion  Left thoracentesis 07/19/2016-cytology showedreactive mesothelial cells; mixed lymphoid population  Ultrasound-guided biopsy of a segment 8 liver lesion on 08/01/2016-adenocarcinoma  Cycle 1 FOLFOX 09/06/2016  Cycle 2 FOLFOX 09/21/2016  Cycle 3 FOLFOX 10/05/2016  2. Prostatic hypertrophy-Status post TUR 01/26/2016  3. Hypertension  4. History of multiple basal cell skin cancers  5. History of thrombocytopenia secondary to chemotherapy  6. PVCs/bicuspid aortic valve-followed by cardiology  7. Port-A-Cath placement 09/06/2016  8.  Mild neutropenia following cycle 3 FOLFOX   Disposition: David [name] appears stable. He has completed 3 cycles of FOLFOX. He has mild neutropenia on exam today. We discussed proceeding with treatment as scheduled and administering Neulasta on the day of pump discontinuation. He is most comfortable with holding treatment today and rescheduling for one week with Neulasta on the day of pump discontinuation. We reviewed potential toxicities associate with Neulasta including bone pain, rash, splenic rupture.  He will return for cycle 4 FOLFOX on 10/30/2016, Neulasta on day of pump discontinuation. We will see him in follow-up prior to cycle 5 on 11/13/2016. He will contact the office in the interim with any problems. We specifically discussed fever,  chills, other signs of infection.  Plan reviewed with Dr. Benay Spice. 25 minutes were spent face-to-face at today's visit with the majority of that time involved in counseling/coordination of care.  Plan reviewed with Dr. Benay Spice.    Ned Card ANP/GNP-BC   10/23/2016  10:46 AM

## 2016-10-23 NOTE — Telephone Encounter (Signed)
Scheduled appt per 7/2 los - per [facility] - okay to keep schedule as is - the 7/9 is capped - next available Is 7/12 when patient is already scheduled. Scheduled next cycle per lisa - Gave patient AVS and calender per los.

## 2016-10-24 ENCOUNTER — Ambulatory Visit: Payer: Medicare Other | Admitting: Oncology

## 2016-11-01 ENCOUNTER — Telehealth: Payer: Self-pay

## 2016-11-02 ENCOUNTER — Other Ambulatory Visit (HOSPITAL_BASED_OUTPATIENT_CLINIC_OR_DEPARTMENT_OTHER): Payer: Medicare Other

## 2016-11-02 ENCOUNTER — Ambulatory Visit (HOSPITAL_BASED_OUTPATIENT_CLINIC_OR_DEPARTMENT_OTHER): Payer: Medicare Other

## 2016-11-02 ENCOUNTER — Ambulatory Visit: Payer: Medicare Other

## 2016-11-02 ENCOUNTER — Ambulatory Visit (HOSPITAL_BASED_OUTPATIENT_CLINIC_OR_DEPARTMENT_OTHER): Payer: Medicare Other | Admitting: Oncology

## 2016-11-02 ENCOUNTER — Other Ambulatory Visit: Payer: Self-pay | Admitting: *Deleted

## 2016-11-02 VITALS — BP 161/81 | HR 67 | Temp 97.8°F | Resp 18 | Ht 75.0 in | Wt 177.4 lb

## 2016-11-02 DIAGNOSIS — I10 Essential (primary) hypertension: Secondary | ICD-10-CM

## 2016-11-02 DIAGNOSIS — C155 Malignant neoplasm of lower third of esophagus: Secondary | ICD-10-CM | POA: Diagnosis not present

## 2016-11-02 DIAGNOSIS — Z85828 Personal history of other malignant neoplasm of skin: Secondary | ICD-10-CM

## 2016-11-02 DIAGNOSIS — Z5111 Encounter for antineoplastic chemotherapy: Secondary | ICD-10-CM | POA: Diagnosis not present

## 2016-11-02 DIAGNOSIS — Z95828 Presence of other vascular implants and grafts: Secondary | ICD-10-CM | POA: Insufficient documentation

## 2016-11-02 LAB — CBC WITH DIFFERENTIAL/PLATELET
BASO%: 0.6 % (ref 0.0–2.0)
Basophils Absolute: 0 10*3/uL (ref 0.0–0.1)
EOS%: 0.9 % (ref 0.0–7.0)
Eosinophils Absolute: 0 10*3/uL (ref 0.0–0.5)
HEMATOCRIT: 40 % (ref 38.4–49.9)
HEMOGLOBIN: 13 g/dL (ref 13.0–17.1)
LYMPH#: 0.4 10*3/uL — AB (ref 0.9–3.3)
LYMPH%: 8.4 % — ABNORMAL LOW (ref 14.0–49.0)
MCH: 33 pg (ref 27.2–33.4)
MCHC: 32.5 g/dL (ref 32.0–36.0)
MCV: 101.5 fL — ABNORMAL HIGH (ref 79.3–98.0)
MONO#: 0.8 10*3/uL (ref 0.1–0.9)
MONO%: 16.8 % — ABNORMAL HIGH (ref 0.0–14.0)
NEUT#: 3.4 10*3/uL (ref 1.5–6.5)
NEUT%: 73.3 % (ref 39.0–75.0)
PLATELETS: 133 10*3/uL — AB (ref 140–400)
RBC: 3.94 10*6/uL — ABNORMAL LOW (ref 4.20–5.82)
RDW: 15.6 % — ABNORMAL HIGH (ref 11.0–14.6)
WBC: 4.7 10*3/uL (ref 4.0–10.3)

## 2016-11-02 LAB — COMPREHENSIVE METABOLIC PANEL
ALBUMIN: 2.9 g/dL — AB (ref 3.5–5.0)
ALT: 14 U/L (ref 0–55)
ANION GAP: 9 meq/L (ref 3–11)
AST: 21 U/L (ref 5–34)
Alkaline Phosphatase: 87 U/L (ref 40–150)
BILIRUBIN TOTAL: 0.59 mg/dL (ref 0.20–1.20)
BUN: 31.5 mg/dL — AB (ref 7.0–26.0)
CALCIUM: 9.2 mg/dL (ref 8.4–10.4)
CHLORIDE: 109 meq/L (ref 98–109)
CO2: 28 mEq/L (ref 22–29)
CREATININE: 1.5 mg/dL — AB (ref 0.7–1.3)
EGFR: 45 mL/min/{1.73_m2} — ABNORMAL LOW (ref >90)
Glucose: 99 mg/dl (ref 70–140)
Potassium: 3.9 mEq/L (ref 3.5–5.1)
Sodium: 145 mEq/L (ref 136–145)
TOTAL PROTEIN: 6.2 g/dL — AB (ref 6.4–8.3)

## 2016-11-02 MED ORDER — DEXTROSE 5 % IV SOLN
85.0000 mg/m2 | Freq: Once | INTRAVENOUS | Status: AC
  Administered 2016-11-02: 175 mg via INTRAVENOUS
  Filled 2016-11-02: qty 35

## 2016-11-02 MED ORDER — PALONOSETRON HCL INJECTION 0.25 MG/5ML
INTRAVENOUS | Status: AC
  Filled 2016-11-02: qty 5

## 2016-11-02 MED ORDER — PALONOSETRON HCL INJECTION 0.25 MG/5ML
0.2500 mg | Freq: Once | INTRAVENOUS | Status: AC
  Administered 2016-11-02: 0.25 mg via INTRAVENOUS

## 2016-11-02 MED ORDER — FLUOROURACIL CHEMO INJECTION 2.5 GM/50ML
400.0000 mg/m2 | Freq: Once | INTRAVENOUS | Status: AC
  Administered 2016-11-02: 800 mg via INTRAVENOUS
  Filled 2016-11-02: qty 16

## 2016-11-02 MED ORDER — SODIUM CHLORIDE 0.9 % IV SOLN
2425.0000 mg/m2 | INTRAVENOUS | Status: DC
  Administered 2016-11-02: 5000 mg via INTRAVENOUS
  Filled 2016-11-02: qty 100

## 2016-11-02 MED ORDER — DEXAMETHASONE SODIUM PHOSPHATE 10 MG/ML IJ SOLN
INTRAMUSCULAR | Status: AC
  Filled 2016-11-02: qty 1

## 2016-11-02 MED ORDER — SODIUM CHLORIDE 0.9% FLUSH
10.0000 mL | Freq: Once | INTRAVENOUS | Status: AC
  Administered 2016-11-02: 10 mL
  Filled 2016-11-02: qty 10

## 2016-11-02 MED ORDER — HEPARIN SOD (PORK) LOCK FLUSH 100 UNIT/ML IV SOLN
500.0000 [IU] | Freq: Once | INTRAVENOUS | Status: DC | PRN
  Filled 2016-11-02: qty 5

## 2016-11-02 MED ORDER — DEXAMETHASONE SODIUM PHOSPHATE 10 MG/ML IJ SOLN
10.0000 mg | Freq: Once | INTRAMUSCULAR | Status: AC
  Administered 2016-11-02: 10 mg via INTRAVENOUS

## 2016-11-02 MED ORDER — LEUCOVORIN CALCIUM INJECTION 350 MG
400.0000 mg/m2 | Freq: Once | INTRAVENOUS | Status: AC
  Administered 2016-11-02: 824 mg via INTRAVENOUS
  Filled 2016-11-02: qty 41.2

## 2016-11-02 MED ORDER — DEXTROSE 5 % IV SOLN
Freq: Once | INTRAVENOUS | Status: AC
  Administered 2016-11-02: 12:00:00 via INTRAVENOUS

## 2016-11-02 NOTE — Patient Instructions (Signed)
[facility] Discharge Instructions for Patients Receiving Chemotherapy  Today you received the following chemotherapy agents Oxaliplatin/Leucovorin/Adrucil  To help prevent nausea and vomiting after your treatment, we encourage you to take your nausea medication    If you develop nausea and vomiting that is not controlled by your nausea medication, call the clinic.   BELOW ARE SYMPTOMS THAT SHOULD BE REPORTED IMMEDIATELY:  *FEVER GREATER THAN 100.5 F  *CHILLS WITH OR WITHOUT FEVER  NAUSEA AND VOMITING THAT IS NOT CONTROLLED WITH YOUR NAUSEA MEDICATION  *UNUSUAL SHORTNESS OF BREATH  *UNUSUAL BRUISING OR BLEEDING  TENDERNESS IN MOUTH AND THROAT WITH OR WITHOUT PRESENCE OF ULCERS  *URINARY PROBLEMS  *BOWEL PROBLEMS  UNUSUAL RASH Items with * indicate a potential emergency and should be followed up as soon as possible.  Feel free to call the clinic you have any questions or concerns. The clinic phone number is [phone].  Please show the [facility] at check-in to the Emergency Department and triage nurse.

## 2016-11-02 NOTE — Progress Notes (Signed)
[facility] OFFICE PROGRESS NOTE   Diagnosis: Esophagus cancer  INTERVAL HISTORY:   [name] returns for scheduled visit. Cycle 4 FOLFOX was held secondary to neutropenia. He reports a good appetite. He is active getting out of the house, but develops fatigue. He is not drinking adequate fluids. No neuropathy symptoms. Stable exertional dyspnea.  Objective:  Vital signs in last 24 hours:  Blood pressure (!) 161/81, pulse 67, temperature 97.8 F (36.6 C), temperature source Oral, resp. rate 18, height 6\' 3"  (1.905 m), weight 177 lb 6.4 oz (80.5 kg), SpO2 95 %.    HEENT: No thrush or ulcers Resp: Lungs clear bilaterally, no respiratory distress Cardio: Regular rate and rhythm GI: No hepatomegaly, no mass, nontender Vascular: No leg edema    Portacath/PICC-without erythema  Lab Results:  Lab Results  Component Value Date   WBC 4.7 11/02/2016   HGB 13.0 11/02/2016   HCT 40.0 11/02/2016   MCV 101.5 (H) 11/02/2016   PLT 133 (L) 11/02/2016   NEUTROABS 3.4 11/02/2016    CMP     Component Value Date/Time   NA 145 11/02/2016 1003   K 3.9 11/02/2016 1003   CL 102 09/06/2016 0947   CO2 28 11/02/2016 1003   GLUCOSE 99 11/02/2016 1003   BUN 31.5 (H) 11/02/2016 1003   CREATININE 1.5 (H) 11/02/2016 1003   CALCIUM 9.2 11/02/2016 1003   PROT 6.2 (L) 11/02/2016 1003   ALBUMIN 2.9 (L) 11/02/2016 1003   AST 21 11/02/2016 1003   ALT 14 11/02/2016 1003   ALKPHOS 87 11/02/2016 1003   BILITOT 0.59 11/02/2016 1003   GFRNONAA >60 09/06/2016 0947   GFRAA >60 09/06/2016 0947    Lab Results  Component Value Date   CEA1 3.73 09/06/2016     Medications: I have reviewed the patient's current medications.  Assessment/Plan: 1. Adenocarcinoma of the distal esophagus,uT3uN2  Presenting with a food impaction and dysphagia 08/20/2014  Staging CT scans of the chest, abdomen, and pelvis 10/06/2014 with indeterminate pulmonary nodules, a 9 mm paraesophageal lymph node,  and circumferential thickening of the distal esophagus  PET scan 10/23/2014 confirmed hypermetabolic thickening at the distal esophagus with small paraesophageal nodes having faint nonspecific activity. A pleural-based focus of consolidation at the posterior right lower lobe had low metabolic activity-favored to be a benign process.  Initiation of concurrent weekly Taxol/carboplatin and radiation on 11/02/2014, radiation completed 12/09/2014  CTs 12/31/2014 revealed distal esophageal wall thickening with adjacent small lymph nodes suspicious for metastases, stable lung nodule posterior to the right bronchus intermedius and the persistent 5 mm nodule in the right lower lobe  Esophagectomy 02/01/2015 with the pathology confirming a ypT3,ypN3 tumor, positive distal margin  CT chest 07/17/2016-bilateral pleural effusions, Bibasilar airspace opacities with peribronchial vascular nodularity, new low-density right liver lesion  Left thoracentesis 07/19/2016-cytology showedreactive mesothelial cells; mixed lymphoid population  Ultrasound-guided biopsy of a segment 8 liver lesion on 08/01/2016-adenocarcinoma  Cycle 1 FOLFOX 09/06/2016  Cycle 2 FOLFOX 09/21/2016  Cycle 3 FOLFOX 10/05/2016  Cycle 4 FOLFOX 11/02/2016  2. Prostatic hypertrophy-Status post TUR 01/26/2016  3. Hypertension  4. History of multiple basal cell skin cancers  5. History of thrombocytopenia secondary to chemotherapy  6. PVCs/bicuspid aortic valve-followed by cardiology  7. Port-A-Cath placement 09/06/2016  8.  Mild neutropenia following cycle 3 FOLFOX, Neulasta added with cycle 4    Disposition:  David [name] has an improved performance status since beginning FOLFOX chemotherapy. He will complete cycle 4 today. He will receive Neulasta  with this cycle. We discussed the potential toxicities associated with Neulasta. He agrees to proceed.  The BUN and creatinine are elevated today. I suspect he has  mild dehydration. He will receive intravenous fluids with chemotherapy today. We will give additional IV fluids when he is here on day 3. I encouraged him to push oral hydration.  David [name] will return for an office visit and cycle 5 chemotherapy on 11/20/2016. He will be referred for a restaging CT after cycle 5.  15 minutes were spent with the patient today. The majority of the time was used for counseling and coordination of care.  Donneta Romberg, MD  11/02/2016  11:09 AM

## 2016-11-03 ENCOUNTER — Telehealth: Payer: Self-pay | Admitting: Oncology

## 2016-11-03 ENCOUNTER — Other Ambulatory Visit: Payer: Self-pay | Admitting: *Deleted

## 2016-11-03 DIAGNOSIS — C155 Malignant neoplasm of lower third of esophagus: Secondary | ICD-10-CM

## 2016-11-03 NOTE — Telephone Encounter (Signed)
LVM TO INFORM PT OF ADDED IVF TO 7/14 APPT PER [place] MS[facility]

## 2016-11-04 ENCOUNTER — Telehealth: Payer: Self-pay | Admitting: Oncology

## 2016-11-04 ENCOUNTER — Ambulatory Visit (HOSPITAL_BASED_OUTPATIENT_CLINIC_OR_DEPARTMENT_OTHER): Payer: Medicare Other

## 2016-11-04 VITALS — BP 138/57 | HR 87 | Temp 97.6°F | Resp 18 | Ht 75.0 in

## 2016-11-04 DIAGNOSIS — D701 Agranulocytosis secondary to cancer chemotherapy: Secondary | ICD-10-CM

## 2016-11-04 DIAGNOSIS — C155 Malignant neoplasm of lower third of esophagus: Secondary | ICD-10-CM | POA: Diagnosis not present

## 2016-11-04 MED ORDER — SODIUM CHLORIDE 0.9 % IV SOLN
INTRAVENOUS | Status: AC
  Administered 2016-11-04: 11:00:00 via INTRAVENOUS

## 2016-11-04 MED ORDER — PROCHLORPERAZINE MALEATE 10 MG PO TABS
10.0000 mg | ORAL_TABLET | Freq: Once | ORAL | Status: AC
  Administered 2016-11-04: 10 mg via ORAL

## 2016-11-04 MED ORDER — PROCHLORPERAZINE MALEATE 10 MG PO TABS
ORAL_TABLET | ORAL | Status: AC
Start: 2016-11-04 — End: 2016-11-04
  Filled 2016-11-04: qty 1

## 2016-11-04 MED ORDER — SODIUM CHLORIDE 0.9% FLUSH
10.0000 mL | INTRAVENOUS | Status: DC | PRN
  Administered 2016-11-04: 10 mL
  Filled 2016-11-04: qty 10

## 2016-11-04 MED ORDER — HEPARIN SOD (PORK) LOCK FLUSH 100 UNIT/ML IV SOLN
500.0000 [IU] | Freq: Once | INTRAVENOUS | Status: AC | PRN
  Administered 2016-11-04: 500 [IU]
  Filled 2016-11-04: qty 5

## 2016-11-04 MED ORDER — PEGFILGRASTIM INJECTION 6 MG/0.6ML ~~LOC~~
6.0000 mg | PREFILLED_SYRINGE | Freq: Once | SUBCUTANEOUS | Status: AC
  Administered 2016-11-04: 6 mg via SUBCUTANEOUS

## 2016-11-04 NOTE — Telephone Encounter (Signed)
sw pt to confirm 7/18 appts per sch msg. Pt will get new sch at that visit

## 2016-11-04 NOTE — Patient Instructions (Signed)
Pegfilgrastim injection What is this medicine? PEGFILGRASTIM (PEG fil gra stim) is a long-acting granulocyte colony-stimulating factor that stimulates the growth of neutrophils, a type of white blood cell important in the body's fight against infection. It is used to reduce the incidence of fever and infection in patients with certain types of cancer who are receiving chemotherapy that affects the bone marrow, and to increase survival after being exposed to high doses of radiation. This medicine may be used for other purposes; ask your health care provider or pharmacist if you have questions. COMMON BRAND NAME(S): Neulasta What should I tell my health care provider before I take this medicine? They need to know if you have any of these conditions: -kidney disease -latex allergy -ongoing radiation therapy -sickle cell disease -skin reactions to acrylic adhesives (On-Body Injector only) -an unusual or allergic reaction to pegfilgrastim, filgrastim, other medicines, foods, dyes, or preservatives -pregnant or trying to get pregnant -breast-feeding How should I use this medicine? This medicine is for injection under the skin. If you get this medicine at home, you will be taught how to prepare and give the pre-filled syringe or how to use the On-body Injector. Refer to the patient Instructions for Use for detailed instructions. Use exactly as directed. Tell your healthcare provider immediately if you suspect that the On-body Injector may not have performed as intended or if you suspect the use of the On-body Injector resulted in a missed or partial dose. It is important that you put your used needles and syringes in a special sharps container. Do not put them in a trash can. If you do not have a sharps container, call your pharmacist or healthcare provider to get one. Talk to your pediatrician regarding the use of this medicine in children. While this drug may be prescribed for selected conditions,  precautions do apply. Overdosage: If you think you have taken too much of this medicine contact a poison control center or emergency room at once. NOTE: This medicine is only for you. Do not share this medicine with others. What if I miss a dose? It is important not to miss your dose. Call your doctor or health care professional if you miss your dose. If you miss a dose due to an On-body Injector failure or leakage, a new dose should be administered as soon as possible using a single prefilled syringe for manual use. What may interact with this medicine? Interactions have not been studied. Give your health care provider a list of all the medicines, herbs, non-prescription drugs, or dietary supplements you use. Also tell them if you smoke, drink alcohol, or use illegal drugs. Some items may interact with your medicine. This list may not describe all possible interactions. Give your health care provider a list of all the medicines, herbs, non-prescription drugs, or dietary supplements you use. Also tell them if you smoke, drink alcohol, or use illegal drugs. Some items may interact with your medicine. What should I watch for while using this medicine? You may need blood work done while you are taking this medicine. If you are going to need a MRI, CT scan, or other procedure, tell your doctor that you are using this medicine (On-Body Injector only). What side effects may I notice from receiving this medicine? Side effects that you should report to your doctor or health care professional as soon as possible: -allergic reactions like skin rash, itching or hives, swelling of the face, lips, or tongue -dizziness -fever -pain, redness, or irritation at site   where injected -pinpoint red spots on the skin -red or dark-brown urine -shortness of breath or breathing problems -stomach or side pain, or pain at the shoulder -swelling -tiredness -trouble passing urine or change in the amount of urine Side  effects that usually do not require medical attention (report to your doctor or health care professional if they continue or are bothersome): -bone pain -muscle pain This list may not describe all possible side effects. Call your doctor for medical advice about side effects. You may report side effects to FDA at 1-800-FDA-1088. Where should I keep my medicine? Keep out of the reach of children. Store pre-filled syringes in a refrigerator between 2 and 8 degrees C (36 and 46 degrees F). Do not freeze. Keep in carton to protect from light. Throw away this medicine if it is left out of the refrigerator for more than 48 hours. Throw away any unused medicine after the expiration date. NOTE: This sheet is a summary. It may not cover all possible information. If you have questions about this medicine, talk to your doctor, pharmacist, or health care provider.  2018 Elsevier/Gold Standard (2016-04-06 12:58:03)  

## 2016-11-07 ENCOUNTER — Other Ambulatory Visit (HOSPITAL_BASED_OUTPATIENT_CLINIC_OR_DEPARTMENT_OTHER): Payer: Medicare Other

## 2016-11-07 ENCOUNTER — Ambulatory Visit (HOSPITAL_BASED_OUTPATIENT_CLINIC_OR_DEPARTMENT_OTHER): Payer: Medicare Other

## 2016-11-07 ENCOUNTER — Other Ambulatory Visit: Payer: Self-pay | Admitting: *Deleted

## 2016-11-07 ENCOUNTER — Other Ambulatory Visit: Payer: Medicare Other

## 2016-11-07 DIAGNOSIS — C155 Malignant neoplasm of lower third of esophagus: Secondary | ICD-10-CM

## 2016-11-07 DIAGNOSIS — Z95828 Presence of other vascular implants and grafts: Secondary | ICD-10-CM

## 2016-11-07 DIAGNOSIS — Z452 Encounter for adjustment and management of vascular access device: Secondary | ICD-10-CM | POA: Diagnosis not present

## 2016-11-07 LAB — BASIC METABOLIC PANEL
ANION GAP: 10 meq/L (ref 3–11)
BUN: 36.6 mg/dL — AB (ref 7.0–26.0)
CHLORIDE: 108 meq/L (ref 98–109)
CO2: 27 mEq/L (ref 22–29)
Calcium: 8.8 mg/dL (ref 8.4–10.4)
Creatinine: 1.2 mg/dL (ref 0.7–1.3)
EGFR: 61 mL/min/{1.73_m2} — AB (ref >90)
GLUCOSE: 95 mg/dL (ref 70–140)
POTASSIUM: 4 meq/L (ref 3.5–5.1)
Sodium: 146 mEq/L — ABNORMAL HIGH (ref 136–145)

## 2016-11-07 MED ORDER — HEPARIN SOD (PORK) LOCK FLUSH 100 UNIT/ML IV SOLN
500.0000 [IU] | Freq: Once | INTRAVENOUS | Status: AC
  Administered 2016-11-07: 500 [IU]
  Filled 2016-11-07: qty 5

## 2016-11-07 MED ORDER — SODIUM CHLORIDE 0.9% FLUSH
10.0000 mL | Freq: Once | INTRAVENOUS | Status: AC
  Administered 2016-11-07: 10 mL
  Filled 2016-11-07: qty 10

## 2016-11-07 MED ORDER — PANTOPRAZOLE SODIUM 20 MG PO TBEC: 20.0000 mg | DELAYED_RELEASE_TABLET | Freq: Every day | ORAL | 1 refills | Status: DC

## 2016-11-08 ENCOUNTER — Ambulatory Visit (HOSPITAL_BASED_OUTPATIENT_CLINIC_OR_DEPARTMENT_OTHER): Payer: Medicare Other | Admitting: Oncology

## 2016-11-08 ENCOUNTER — Other Ambulatory Visit: Payer: Self-pay | Admitting: *Deleted

## 2016-11-08 ENCOUNTER — Other Ambulatory Visit: Payer: Medicare Other

## 2016-11-08 ENCOUNTER — Encounter: Payer: Self-pay | Admitting: Oncology

## 2016-11-08 ENCOUNTER — Telehealth: Payer: Self-pay | Admitting: *Deleted

## 2016-11-08 ENCOUNTER — Ambulatory Visit (HOSPITAL_COMMUNITY)
Admission: RE | Admit: 2016-11-08 | Discharge: 2016-11-08 | Disposition: A | Discharge status: Home / Self Care | Payer: Medicare Other | Source: Ambulatory Visit | Attending: Nurse Practitioner | Admitting: Nurse Practitioner

## 2016-11-08 VITALS — BP 164/73 | HR 77 | Resp 20 | Ht 75.0 in | Wt 182.5 lb

## 2016-11-08 DIAGNOSIS — R0600 Dyspnea, unspecified: Secondary | ICD-10-CM | POA: Insufficient documentation

## 2016-11-08 DIAGNOSIS — J90 Pleural effusion, not elsewhere classified: Secondary | ICD-10-CM | POA: Insufficient documentation

## 2016-11-08 DIAGNOSIS — C155 Malignant neoplasm of lower third of esophagus: Secondary | ICD-10-CM

## 2016-11-08 NOTE — Progress Notes (Signed)
SYMPTOM MANAGEMENT CLINIC    Chief Complaint: Increased shortness of breath  HPI:  David [name] 76 y.o. male diagnosed with esophageal cancer currently being treated with FOLFOX status post 4 cycles. Patient presented today with increased dyspnea and shortness of breath. Reports shortness of breath and dyspnea have an worsened since his last cycle of chemotherapy. He has some difficulty laying flat at night. He has no chest wall bed and has been raising the head of the bed. He notices an increased dry cough. Denies fevers and chills. Chest discomfort. Appetite has been good and he has gained 5 pounds since his last visit. Denies edema in his lower extremity.    Cancer of lower third of esophagus ([place])   09/28/2014 Initial Diagnosis    Cancer of lower third of esophagus      10/23/2014 Imaging    PET Scan:      10/23/2014 Imaging    CT C/A/P ;      10/28/2014 Pathology Results    Invasive adenocarcinoma in background of [name]'s esophagus       Review of Systems  Constitutional: Positive for malaise/fatigue. Negative for chills, fever and weight loss.  HENT: Negative.   Eyes: Negative.   Respiratory: Positive for cough and shortness of breath. Negative for hemoptysis, sputum production and wheezing.   Cardiovascular: Negative.   Gastrointestinal: Negative.   Genitourinary: Negative.   Musculoskeletal: Negative.   Skin: Negative.   Neurological: Negative.  Negative for weakness.  Endo/Heme/Allergies: Negative.   Psychiatric/Behavioral: Negative.     Past Medical History:  Diagnosis Date  . Allergy   . Anxiety   . Arthritis   . [name]'s esophagus   . Basal cell carcinoma    nose, face, back  . Dysrhythmia   . Enlarged prostate   . Esophageal cancer ([place])   . Food impaction of esophagus 08/20/2014  . GERD (gastroesophageal reflux disease)    needs 30* elevation since total esophagectomy -uses wedge at home  . Hypertension   . Kidney stone   . Radiation   . Urinary  frequency   . Wears glasses     Past Surgical History:  Procedure Laterality Date  . APPENDECTOMY  1961  . CERVICAL LAMINECTOMY  1980  . COLONOSCOPY     x2  . COMPLETE ESOPHAGECTOMY [name] 02/01/2015   Procedure: TRANSHIATAL TOTAL ESOPHAGECTOMY  ;  Surgeon: Grace Isaac, MD;  Location: [facility];  Service: Thoracic;  Laterality: [name];  . CYSTOSCOPY WITH STENT PLACEMENT Right 01/26/2016   Procedure: CYSTOSCOPY WITH STENT PLACEMENT;  Surgeon: Franchot Gallo, MD;  Location[facility];  Service: Urology;  Laterality: Right;  . CYSTOSCOPY/RETROGRADE/URETEROSCOPY/STONE EXTRACTION WITH BASKET Right 01/26/2016   Procedure: CYSTOSCOPY/RETROGRADE/URETEROSCOPY/STONE EXTRACTION WITH BASKET;  Surgeon: Franchot Gallo, MD;  Location: [facility];  Service: Urology;  Laterality: Right;  . ESOPHAGOGASTRODUODENOSCOPY [name] 08/20/2014   Procedure: ESOPHAGOGASTRODUODENOSCOPY (EGD);  Surgeon: Gatha Mayer, MD;  Location: [facility] ENDOSCOPY;  Service: Endoscopy;  Laterality: [name];  . ESOPHAGOGASTROSTOMY  02/01/2015   Procedure: CERVICAL ESOPHAGOGASTROSTOMY;  Surgeon: Grace Isaac, MD;  Location: [place];  Service: Thoracic;;  . EUS [name] 10/15/2014   Procedure: UPPER ENDOSCOPIC ULTRASOUND (EUS) LINEAR;  Surgeon: Milus Banister, MD;  Location: [facility] ENDOSCOPY;  Service: Endoscopy;  Laterality: [name];  radial linear  . HOLMIUM LASER APPLICATION Right 48/11/8914   Procedure: HOLMIUM LASER APPLICATION;  Surgeon: Franchot Gallo, MD;  Location: [facility];  Service: Urology;  Laterality: Right;  .  IR FLUORO GUIDE PORT INSERTION RIGHT  09/06/2016  . IR US GUIDE VASC ACCESS RIGHT  09/06/2016  . JEJUNOSTOMY  02/01/2015   Procedure: JEJUNOSTOMY;  Surgeon: Grace Isaac, MD;  Location: [place];  Service: Thoracic;;  . Edwena Blow  02/01/2015   Procedure: Edwena Blow;  Surgeon: Grace Isaac, MD;  Location: [place];  Service: Thoracic;;  . TOTAL KNEE ARTHROPLASTY  2010    right  . TRANSURETHRAL RESECTION OF PROSTATE [name] 01/26/2016   Procedure: TRANSURETHRAL RESECTION OF THE PROSTATE (TURP);  Surgeon: Franchot Gallo, MD;  Location: [facility];  Service: Urology;  Laterality: [name];    has Food impaction of esophagus; Cancer of lower third of esophagus ([place]); Hyperplasia of prostate with lower urinary tract symptoms (LUTS); Goals of care, counseling/discussion; and Port catheter in place on his problem list.    is allergic to dilaudid [hydromorphone hcl]; fentanyl; nexium [esomeprazole magnesium]; other; oxycodone; and statins.  Allergies as of 11/08/2016      Reactions   Dilaudid [hydromorphone Hcl] Hives, Itching   Fentanyl Itching   Nexium [esomeprazole Magnesium] Other (See Comments)   Difficulty urinating and passing stool; dry mouth   Other    Thinks it was oxycodone; made him hallucinate   Oxycodone Nausea And Vomiting   Statins Other (See Comments)   Leg cramping      Medication List       Accurate as of 11/08/16  4:43 PM. Always use your most recent med list.          ALPRAZolam 0.5 MG tablet Commonly known as:  XANAX Take 0.5 mg by mouth at bedtime as needed for sleep.   b complex vitamins capsule Take 1 capsule by mouth daily.   cyproheptadine 2 MG/5ML syrup Commonly known as:  PERIACTIN Take by mouth 3 (three) times daily. 10 ml prior to each meal   flecainide 100 MG tablet Commonly known as:  TAMBOCOR TAKE 1 TABLET (100 MG TOTAL) BY MOUTH 2 (TWO) TIMES DAILY.   HYDROcodone-acetaminophen 5-325 MG tablet Commonly known as:  NORCO/VICODIN Take 1-2 tablets by mouth every 6 (six) hours as needed for moderate pain.   lidocaine-prilocaine cream Commonly known as:  EMLA Apply to port site one hour prior to use. Do not rub in. Cover with plastic.   metoprolol succinate 50 MG 24 hr tablet Commonly known as:  TOPROL-XL Take 50 mg by mouth daily.   multivitamin tablet Take 1 tablet by mouth daily.   ondansetron  8 MG tablet Commonly known as:  ZOFRAN Take 1 tablet (8 mg total) by mouth every 8 (eight) hours as needed for nausea or vomiting.   pantoprazole 20 MG tablet Commonly known as:  PROTONIX Take 1 tablet (20 mg total) by mouth daily.   prochlorperazine 10 MG tablet Commonly known as:  COMPAZINE Take 10 mg by mouth every 6 (six) hours as needed for nausea or vomiting.        PHYSICAL EXAMINATION  Oncology Vitals 11/08/2016 11/04/2016  Height 191 cm 191 cm  Weight 82.781 kg -  Weight (lbs) 182 lbs 8 oz -  BMI (kg/m2) 22.81 kg/m2 -  Temp - 97.6  Pulse 77 87  Resp 20 18  SpO2 92 93  BSA (m2) 2.09 m2 -   BP Readings from Last 2 Encounters:  11/08/16 (!) 164/73  11/04/16 (!) 138/57    Physical Exam  Constitutional: He is oriented to person, place, and time and well-developed, well-nourished, and in no distress. No  distress.  HENT:  Head: Normocephalic and atraumatic.  Mouth/Throat: Oropharynx is clear and moist. No oropharyngeal exudate.  Eyes: Conjunctivae are normal. No scleral icterus.  Neck: No JVD present. No tracheal deviation present. No thyromegaly present.  Cardiovascular: Normal rate, regular rhythm and normal heart sounds.   Pulmonary/Chest: Effort normal.  Diminished breath sounds to the left lung from the midlung down to the base. Diminished breath sounds in the right lung base.  Abdominal: Soft. Bowel sounds are normal. He exhibits no distension. There is no tenderness.  Musculoskeletal: Normal range of motion. He exhibits no edema.  Lymphadenopathy:    He has no cervical adenopathy.  Neurological: He is alert and oriented to person, place, and time. He exhibits normal muscle tone.  Skin: Skin is warm and dry.  Psychiatric: Mood, memory, affect and judgment normal.  Vitals reviewed.   LABORATORY DATA:. Appointment on 11/07/2016  Component Date Value Ref Range Status  . Sodium 11/07/2016 146* 136 - 145 mEq/L Final  . Potassium 11/07/2016 4.0  3.5 - 5.1  mEq/L Final  . Chloride 11/07/2016 108  98 - 109 mEq/L Final  . CO2 11/07/2016 27  22 - 29 mEq/L Final  . Glucose 11/07/2016 95  70 - 140 mg/dl Final   Glucose reference range is for nonfasting patients. Fasting glucose reference range is 70- 100.  Marland Kitchen BUN 11/07/2016 36.6* 7.0 - 26.0 mg/dL Final  . Creatinine 11/07/2016 1.2  0.7 - 1.3 mg/dL Final  . Calcium 11/07/2016 8.8  8.4 - 10.4 mg/dL Final  . Anion Gap 11/07/2016 10  3 - 11 mEq/L Final  . EGFR 11/07/2016 61* >90 ml/min/1.73 m2 Final   eGFR is calculated using the CKD-EPI Creatinine Equation (2009)    RADIOGRAPHIC STUDIES: Dg Chest 2 View  Result Date: 11/08/2016 CLINICAL DATA:  Dyspnea since Thursday. History of esophageal cancer. EXAM: CHEST  2 VIEW COMPARISON:  10/12/2016 FINDINGS: Low volume chest with increased, now moderate left pleural effusion. Small right pleural effusion. Visible interstitial markings are somewhat coarsened but no suspected edema. No pneumothorax or air bronchograms. Non-obscured heart size is stable. Porta catheter on the right with tip at the upper cavoatrial junction. IMPRESSION: 1. Increased, moderate left pleural effusion. 2. Small right pleural effusion. 3. Low volume chest. Electronically Signed   By: Monte Fantasia M.D.   On: 11/08/2016 14:48    ASSESSMENT/PLAN:    No problem-specific Assessment & Plan notes found for this encounter.  This is a 76 year old gentleman with esophageal cancer currently being treated with FOLFOX. He is status post 4 cycles of his chemotherapy. He presents today with increased shortness of breath, dyspnea on exertion and nonproductive cough. Chest x-ray was obtained prior to this visit which showed increased, moderate left pleural effusion and a small right pleural effusion. The patient will be set up for a thoracentesis in the next one to 2 days in interventional radiology. The patient is in agreement with this plan.  Discussed with the patient becomes more short of breath or  has any chest discomfort he is to be evaluated in the emergency room. He will keep his follow-up with medical oncology as previously scheduled.  Patient stated understanding of all instructions; and was in agreement with this plan of care. The patient knows to call the clinic with any problems, questions or concerns.   Mikey Bussing, NP 11/08/2016

## 2016-11-08 NOTE — Telephone Encounter (Signed)
Patient c/o SOB on exertion. Per Ned Card NP he is to have a cxr now at [facility] long radiology. Patient verbalizes understanding.

## 2016-11-10 ENCOUNTER — Ambulatory Visit (HOSPITAL_COMMUNITY)
Admission: RE | Admit: 2016-11-10 | Discharge: 2016-11-10 | Disposition: A | Discharge status: Home / Self Care | Payer: Medicare Other | Source: Ambulatory Visit | Attending: Oncology | Admitting: Oncology

## 2016-11-10 ENCOUNTER — Ambulatory Visit (HOSPITAL_COMMUNITY)
Admission: RE | Admit: 2016-11-10 | Discharge: 2016-11-10 | Disposition: A | Discharge status: Home / Self Care | Payer: Medicare Other | Source: Ambulatory Visit | Attending: General Surgery | Admitting: General Surgery

## 2016-11-10 DIAGNOSIS — J90 Pleural effusion, not elsewhere classified: Secondary | ICD-10-CM | POA: Diagnosis not present

## 2016-11-10 DIAGNOSIS — R079 Chest pain, unspecified: Secondary | ICD-10-CM | POA: Diagnosis not present

## 2016-11-10 DIAGNOSIS — Z9889 Other specified postprocedural states: Secondary | ICD-10-CM

## 2016-11-10 NOTE — Procedures (Signed)
Ultrasound-guided therapeutic right thoracentesis performed yielding 1.95 liters of sreous colored fluid. No immediate complications. Follow-up chest x-ray pending.       Beyza Bellino E 2:43 PM 11/10/2016

## 2016-11-13 ENCOUNTER — Telehealth: Payer: Self-pay

## 2016-11-13 DIAGNOSIS — C155 Malignant neoplasm of lower third of esophagus: Secondary | ICD-10-CM

## 2016-11-13 NOTE — Telephone Encounter (Signed)
Pt called requesting a thoracentesis on left lung. Pt had thoracentesis on right lung 7/20 of 1.95 liters.  Pt states the PA in IR told him he needed another thoracentesis on L done b/c she saw another pocket on left that has about the same quantity.  He is breathing better, he is still having some restriction. He is feeling lethargic today, he did not have energy to go out to lunch with friends today.   S/w Dr Benay Spice and order placed, with cytology.  Called central scheduling: Wed at 2, arrive 145 for registration. Called pt with appt.

## 2016-11-15 ENCOUNTER — Ambulatory Visit (HOSPITAL_COMMUNITY)
Admission: RE | Admit: 2016-11-15 | Discharge: 2016-11-15 | Disposition: A | Discharge status: Home / Self Care | Payer: Medicare Other | Source: Ambulatory Visit | Attending: Oncology | Admitting: Oncology

## 2016-11-15 ENCOUNTER — Ambulatory Visit (HOSPITAL_COMMUNITY)
Admission: RE | Admit: 2016-11-15 | Discharge: 2016-11-15 | Disposition: A | Discharge status: Home / Self Care | Payer: Medicare Other | Source: Ambulatory Visit | Attending: Radiology | Admitting: Radiology

## 2016-11-15 ENCOUNTER — Encounter (HOSPITAL_COMMUNITY): Payer: Self-pay

## 2016-11-15 DIAGNOSIS — R846 Abnormal cytological findings in specimens from respiratory organs and thorax: Secondary | ICD-10-CM | POA: Diagnosis not present

## 2016-11-15 DIAGNOSIS — I700 Atherosclerosis of aorta: Secondary | ICD-10-CM | POA: Diagnosis not present

## 2016-11-15 DIAGNOSIS — C155 Malignant neoplasm of lower third of esophagus: Secondary | ICD-10-CM

## 2016-11-15 DIAGNOSIS — J90 Pleural effusion, not elsewhere classified: Secondary | ICD-10-CM | POA: Insufficient documentation

## 2016-11-15 DIAGNOSIS — Z9889 Other specified postprocedural states: Secondary | ICD-10-CM

## 2016-11-15 NOTE — Procedures (Signed)
Ultrasound-guided diagnostic and therapeutic left thoracentesis performed yielding 1.9 liters of bloody fluid. No immediate complications. Follow-up chest x-ray pending. A portion of the fluid was sent to the lab for preordered studies. Only the above amount of fluid was removed today secondary to persistent pt coughing.

## 2016-11-16 ENCOUNTER — Ambulatory Visit: Payer: Medicare Other | Admitting: Nurse Practitioner

## 2016-11-16 ENCOUNTER — Other Ambulatory Visit: Payer: Medicare Other

## 2016-11-16 ENCOUNTER — Ambulatory Visit: Payer: Medicare Other

## 2016-11-19 ENCOUNTER — Other Ambulatory Visit: Payer: Self-pay | Admitting: Oncology

## 2016-11-20 ENCOUNTER — Other Ambulatory Visit (HOSPITAL_BASED_OUTPATIENT_CLINIC_OR_DEPARTMENT_OTHER): Payer: Medicare Other

## 2016-11-20 ENCOUNTER — Telehealth: Payer: Self-pay | Admitting: Oncology

## 2016-11-20 ENCOUNTER — Ambulatory Visit (HOSPITAL_BASED_OUTPATIENT_CLINIC_OR_DEPARTMENT_OTHER): Payer: Medicare Other | Admitting: Oncology

## 2016-11-20 ENCOUNTER — Ambulatory Visit (HOSPITAL_BASED_OUTPATIENT_CLINIC_OR_DEPARTMENT_OTHER): Payer: Medicare Other

## 2016-11-20 VITALS — BP 158/100 | HR 70 | Temp 98.2°F | Resp 18 | Ht 75.0 in | Wt 173.6 lb

## 2016-11-20 DIAGNOSIS — C155 Malignant neoplasm of lower third of esophagus: Secondary | ICD-10-CM

## 2016-11-20 DIAGNOSIS — C787 Secondary malignant neoplasm of liver and intrahepatic bile duct: Secondary | ICD-10-CM | POA: Diagnosis not present

## 2016-11-20 DIAGNOSIS — Z5111 Encounter for antineoplastic chemotherapy: Secondary | ICD-10-CM

## 2016-11-20 DIAGNOSIS — I10 Essential (primary) hypertension: Secondary | ICD-10-CM

## 2016-11-20 LAB — CBC WITH DIFFERENTIAL/PLATELET
BASO%: 0.2 % (ref 0.0–2.0)
BASOS ABS: 0 10*3/uL (ref 0.0–0.1)
EOS ABS: 0.1 10*3/uL (ref 0.0–0.5)
EOS%: 0.4 % (ref 0.0–7.0)
HEMATOCRIT: 39.3 % (ref 38.4–49.9)
HEMOGLOBIN: 12.8 g/dL — AB (ref 13.0–17.1)
LYMPH#: 1.1 10*3/uL (ref 0.9–3.3)
LYMPH%: 5.8 % — ABNORMAL LOW (ref 14.0–49.0)
MCH: 33.4 pg (ref 27.2–33.4)
MCHC: 32.6 g/dL (ref 32.0–36.0)
MCV: 102.6 fL — AB (ref 79.3–98.0)
MONO#: 0.6 10*3/uL (ref 0.1–0.9)
MONO%: 3.3 % (ref 0.0–14.0)
NEUT%: 90.3 % — ABNORMAL HIGH (ref 39.0–75.0)
NEUTROS ABS: 16.2 10*3/uL — AB (ref 1.5–6.5)
PLATELETS: 172 10*3/uL (ref 140–400)
RBC: 3.83 10*6/uL — ABNORMAL LOW (ref 4.20–5.82)
RDW: 15.6 % — AB (ref 11.0–14.6)
WBC: 18 10*3/uL — AB (ref 4.0–10.3)

## 2016-11-20 LAB — COMPREHENSIVE METABOLIC PANEL
ALBUMIN: 2.7 g/dL — AB (ref 3.5–5.0)
ALK PHOS: 136 U/L (ref 40–150)
ALT: 13 U/L (ref 0–55)
AST: 22 U/L (ref 5–34)
Anion Gap: 8 mEq/L (ref 3–11)
BILIRUBIN TOTAL: 0.44 mg/dL (ref 0.20–1.20)
BUN: 25.4 mg/dL (ref 7.0–26.0)
CALCIUM: 9 mg/dL (ref 8.4–10.4)
CO2: 31 mEq/L — ABNORMAL HIGH (ref 22–29)
Chloride: 105 mEq/L (ref 98–109)
Creatinine: 1 mg/dL (ref 0.7–1.3)
EGFR: 78 mL/min/{1.73_m2} — AB (ref >90)
GLUCOSE: 97 mg/dL (ref 70–140)
POTASSIUM: 3.5 meq/L (ref 3.5–5.1)
SODIUM: 144 meq/L (ref 136–145)
TOTAL PROTEIN: 6.1 g/dL — AB (ref 6.4–8.3)

## 2016-11-20 MED ORDER — DEXTROSE 5 % IV SOLN
Freq: Once | INTRAVENOUS | Status: AC
  Administered 2016-11-20: 11:00:00 via INTRAVENOUS

## 2016-11-20 MED ORDER — SODIUM CHLORIDE 0.9 % IV SOLN
5000.0000 mg | INTRAVENOUS | Status: DC
  Administered 2016-11-20: 5000 mg via INTRAVENOUS
  Filled 2016-11-20: qty 100

## 2016-11-20 MED ORDER — FLUOROURACIL CHEMO INJECTION 2.5 GM/50ML
400.0000 mg/m2 | Freq: Once | INTRAVENOUS | Status: AC
Start: 2016-11-20 — End: 2016-11-20
  Administered 2016-11-20: 800 mg via INTRAVENOUS
  Filled 2016-11-20: qty 16

## 2016-11-20 MED ORDER — DEXAMETHASONE SODIUM PHOSPHATE 10 MG/ML IJ SOLN
INTRAMUSCULAR | Status: AC
  Filled 2016-11-20: qty 1

## 2016-11-20 MED ORDER — OXALIPLATIN CHEMO INJECTION 100 MG/20ML
85.0000 mg/m2 | Freq: Once | INTRAVENOUS | Status: AC
  Administered 2016-11-20: 175 mg via INTRAVENOUS
  Filled 2016-11-20: qty 35

## 2016-11-20 MED ORDER — PALONOSETRON HCL INJECTION 0.25 MG/5ML
INTRAVENOUS | Status: AC
  Filled 2016-11-20: qty 5

## 2016-11-20 MED ORDER — PALONOSETRON HCL INJECTION 0.25 MG/5ML
0.2500 mg | Freq: Once | INTRAVENOUS | Status: AC
  Administered 2016-11-20: 0.25 mg via INTRAVENOUS

## 2016-11-20 MED ORDER — LEUCOVORIN CALCIUM INJECTION 350 MG
824.0000 mg | Freq: Once | INTRAMUSCULAR | Status: AC
  Administered 2016-11-20: 824 mg via INTRAVENOUS
  Filled 2016-11-20: qty 41.2

## 2016-11-20 MED ORDER — DEXAMETHASONE SODIUM PHOSPHATE 10 MG/ML IJ SOLN
10.0000 mg | Freq: Once | INTRAMUSCULAR | Status: AC
  Administered 2016-11-20: 10 mg via INTRAVENOUS

## 2016-11-20 NOTE — Patient Instructions (Signed)
[facility] Discharge Instructions for Patients Receiving Chemotherapy  Today you received the following chemotherapy agents Oxaliplatin, Leucovorin, Adrucil  To help prevent nausea and vomiting after your treatment, we encourage you to take your nausea medication    If you develop nausea and vomiting that is not controlled by your nausea medication, call the clinic.   BELOW ARE SYMPTOMS THAT SHOULD BE REPORTED IMMEDIATELY:  *FEVER GREATER THAN 100.5 F  *CHILLS WITH OR WITHOUT FEVER  NAUSEA AND VOMITING THAT IS NOT CONTROLLED WITH YOUR NAUSEA MEDICATION  *UNUSUAL SHORTNESS OF BREATH  *UNUSUAL BRUISING OR BLEEDING  TENDERNESS IN MOUTH AND THROAT WITH OR WITHOUT PRESENCE OF ULCERS  *URINARY PROBLEMS  *BOWEL PROBLEMS  UNUSUAL RASH Items with * indicate a potential emergency and should be followed up as soon as possible.  Feel free to call the clinic you have any questions or concerns. The clinic phone number is [phone].  Please show the CHEMO ALERT CARD at check-in to the Emergency Department and triage nurse.   

## 2016-11-20 NOTE — Telephone Encounter (Signed)
Gave patient avs report and appointments for August. Central radiology will call re scans.  °

## 2016-11-20 NOTE — Progress Notes (Signed)
David [name] OFFICE PROGRESS NOTE   Diagnosis: Esophagus cancer  INTERVAL HISTORY:   David [name] returns as scheduled. He completed another cycle of FOLFOX on 11/02/2016. He reports a few episodes of diarrhea following chemotherapy. He had shoulder pain after receiving Neulasta. He developed increased dyspnea last week. He underwent a right thoracentesis for 1.95 L of fluid on 11/10/2016. He underwent a left thoracentesis 41.9 L of bloody fluid on 11/15/2016. He reports improved dyspnea following the thoracentesis procedures. He continues to have "weakness ". No dysphagia. He has low back pain radiating to the legs at night.  No peripheral numbness.  Objective:  Vital signs in last 24 hours:  Blood pressure (!) 158/100, pulse 70, temperature 98.2 F (36.8 C), temperature source Oral, resp. rate 18, height 6\' 3"  (1.905 m), weight 173 lb 9.6 oz (78.7 kg).    HEENT: No thrush or ulcers Resp: Decreased breath sounds at the left greater than right lower chest. Inspiratory rub heard throughout the left chest. No respiratory distress Cardio: Regular rate and rhythm GI: No hepatomegaly, nontender Vascular: No leg edema  Portacath/PICC-without erythema  Lab Results:  Lab Results  Component Value Date   WBC 4.7 11/02/2016   HGB 13.0 11/02/2016   HCT 40.0 11/02/2016   MCV 101.5 (H) 11/02/2016   PLT 133 (L) 11/02/2016   NEUTROABS 3.4 11/02/2016    CMP     Component Value Date/Time   NA 146 (H) 11/07/2016 1253   K 4.0 11/07/2016 1253   CL 102 09/06/2016 0947   CO2 27 11/07/2016 1253   GLUCOSE 95 11/07/2016 1253   BUN 36.6 (H) 11/07/2016 1253   CREATININE 1.2 11/07/2016 1253   CALCIUM 8.8 11/07/2016 1253   PROT 6.2 (L) 11/02/2016 1003   ALBUMIN 2.9 (L) 11/02/2016 1003   AST 21 11/02/2016 1003   ALT 14 11/02/2016 1003   ALKPHOS 87 11/02/2016 1003   BILITOT 0.59 11/02/2016 1003   GFRNONAA >60 09/06/2016 0947   GFRAA >60 09/06/2016 0947    Lab Results    Component Value Date   CEA1 3.73 09/06/2016    Lab Results  Component Value Date   INR 1.08 09/06/2016    Imaging:  No results found.  Medications: I have reviewed the patient's current medications.  Assessment/Plan: 1. Adenocarcinoma of the distal esophagus,[id number]  Presenting with a food impaction and dysphagia 08/20/2014  Staging CT scans of the chest, abdomen, and pelvis 10/06/2014 with indeterminate pulmonary nodules, a 9 mm paraesophageal lymph node, and circumferential thickening of the distal esophagus  PET scan 10/23/2014 confirmed hypermetabolic thickening at the distal esophagus with small paraesophageal nodes having faint nonspecific activity. A pleural-based focus of consolidation at the posterior right lower lobe had low metabolic activity-favored to be a benign process.  Initiation of concurrent weekly Taxol/carboplatin and radiation on 11/02/2014, radiation completed 12/09/2014  CTs 12/31/2014 revealed distal esophageal wall thickening with adjacent small lymph nodes suspicious for metastases, stable lung nodule posterior to the right bronchus intermedius and the persistent 5 mm nodule in the right lower lobe  Esophagectomy 02/01/2015 with the pathology confirming a ypT3,ypN3 tumor, positive distal margin  CT chest 07/17/2016-bilateral pleural effusions, Bibasilar airspace opacities with peribronchial vascular nodularity, new low-density right liver lesion  Left thoracentesis 07/19/2016-cytology showedreactive mesothelial cells; mixed lymphoid population  Ultrasound-guided biopsy of a segment 8 liver lesion on 08/01/2016-adenocarcinoma  Cycle 1 FOLFOX 09/06/2016  Cycle 2 FOLFOX 09/21/2016  Cycle 3 FOLFOX 10/05/2016  Cycle 4 FOLFOX 11/02/2016  2. Prostatic  hypertrophy-Status post TUR 01/26/2016  3. Hypertension  4. History of multiple basal cell skin cancers  5. History of thrombocytopenia secondary to chemotherapy  6. PVCs/bicuspid  aortic valve-followed by cardiology  7. Port-A-Cath placement 09/06/2016  8. Mild neutropenia following cycle 3 FOLFOX, Neulasta added with cycle 4  9.  Bilateral pleural effusions, status post right thoracentesis 11/10/2016, left thoracentesis 11/15/2016  Cytology from 11/15/2016-negative    Disposition:  David [name] has completed 4 cycles of FOLFOX. He has tolerated the chemotherapy well. He developed progressive bilateral effusions over the past few weeks and underwent palliative thoracentesis procedures. I suspect he has malignant pleural effusions. The plan is to complete cycle 5 chemotherapy today. He will undergo a restaging chest CT after this cycle. We will decide on further FOLFOX chemotherapy based on the CT result. He will be referred for another palliative thoracentesis as needed.  David [name] will return for an office visit 12/04/2016.    Donneta Romberg, MD  11/20/2016  8:27 AM

## 2016-11-22 ENCOUNTER — Ambulatory Visit: Payer: Medicare Other

## 2016-11-22 ENCOUNTER — Ambulatory Visit (HOSPITAL_BASED_OUTPATIENT_CLINIC_OR_DEPARTMENT_OTHER): Payer: Medicare Other

## 2016-11-22 VITALS — BP 150/75 | HR 69 | Temp 98.0°F | Resp 18

## 2016-11-22 DIAGNOSIS — C155 Malignant neoplasm of lower third of esophagus: Secondary | ICD-10-CM

## 2016-11-22 DIAGNOSIS — Z452 Encounter for adjustment and management of vascular access device: Secondary | ICD-10-CM

## 2016-11-22 MED ORDER — PEGFILGRASTIM INJECTION 6 MG/0.6ML ~~LOC~~
6.0000 mg | PREFILLED_SYRINGE | Freq: Once | SUBCUTANEOUS | Status: DC
  Filled 2016-11-22: qty 0.6

## 2016-11-22 MED ORDER — SODIUM CHLORIDE 0.9% FLUSH
10.0000 mL | INTRAVENOUS | Status: DC | PRN
  Administered 2016-11-22: 10 mL
  Filled 2016-11-22: qty 10

## 2016-11-22 MED ORDER — HEPARIN SOD (PORK) LOCK FLUSH 100 UNIT/ML IV SOLN
500.0000 [IU] | Freq: Once | INTRAVENOUS | Status: AC | PRN
  Administered 2016-11-22: 500 [IU]
  Filled 2016-11-22: qty 5

## 2016-11-27 NOTE — Telephone Encounter (Signed)
Call placed back to pt to inform him that he has appointments for chemotherapy on 8/13 to hold a spot for him if MD Benay Spice wants to continue his treatment. Pt agrees with plan of care. Pt also wants results of recent thoracentesis. Informed pt that this RN will let MD Benay Spice know he is wanting the results. Pt verbalizes understanding and agrees with plan of care.

## 2016-11-30 ENCOUNTER — Ambulatory Visit (HOSPITAL_COMMUNITY)
Admission: RE | Admit: 2016-11-30 | Discharge: 2016-11-30 | Disposition: A | Discharge status: Home / Self Care | Payer: Medicare Other | Source: Ambulatory Visit | Attending: Oncology | Admitting: Oncology

## 2016-11-30 DIAGNOSIS — R918 Other nonspecific abnormal finding of lung field: Secondary | ICD-10-CM | POA: Diagnosis not present

## 2016-11-30 DIAGNOSIS — I2510 Atherosclerotic heart disease of native coronary artery without angina pectoris: Secondary | ICD-10-CM | POA: Insufficient documentation

## 2016-11-30 DIAGNOSIS — I700 Atherosclerosis of aorta: Secondary | ICD-10-CM | POA: Insufficient documentation

## 2016-11-30 DIAGNOSIS — Z9889 Other specified postprocedural states: Secondary | ICD-10-CM | POA: Insufficient documentation

## 2016-11-30 DIAGNOSIS — C155 Malignant neoplasm of lower third of esophagus: Secondary | ICD-10-CM | POA: Diagnosis not present

## 2016-11-30 DIAGNOSIS — J90 Pleural effusion, not elsewhere classified: Secondary | ICD-10-CM | POA: Diagnosis not present

## 2016-11-30 DIAGNOSIS — C787 Secondary malignant neoplasm of liver and intrahepatic bile duct: Secondary | ICD-10-CM | POA: Diagnosis not present

## 2016-11-30 MED ORDER — IOPAMIDOL (ISOVUE-300) INJECTION 61%
75.0000 mL | Freq: Once | INTRAVENOUS | Status: AC | PRN
  Administered 2016-11-30: 75 mL via INTRAVENOUS

## 2016-11-30 MED ORDER — IOPAMIDOL (ISOVUE-300) INJECTION 61%
INTRAVENOUS | Status: AC
  Filled 2016-11-30: qty 75

## 2016-12-03 ENCOUNTER — Other Ambulatory Visit: Payer: Self-pay | Admitting: Oncology

## 2016-12-04 ENCOUNTER — Ambulatory Visit: Payer: Medicare Other

## 2016-12-04 ENCOUNTER — Ambulatory Visit (HOSPITAL_COMMUNITY)
Admission: RE | Admit: 2016-12-04 | Discharge: 2016-12-04 | Disposition: A | Discharge status: Home / Self Care | Payer: Medicare Other | Source: Ambulatory Visit | Attending: Oncology | Admitting: Oncology

## 2016-12-04 ENCOUNTER — Ambulatory Visit (HOSPITAL_COMMUNITY): Payer: Medicare Other

## 2016-12-04 ENCOUNTER — Other Ambulatory Visit (HOSPITAL_BASED_OUTPATIENT_CLINIC_OR_DEPARTMENT_OTHER): Payer: Medicare Other

## 2016-12-04 ENCOUNTER — Encounter (HOSPITAL_COMMUNITY): Payer: Self-pay

## 2016-12-04 ENCOUNTER — Ambulatory Visit (HOSPITAL_BASED_OUTPATIENT_CLINIC_OR_DEPARTMENT_OTHER): Payer: Medicare Other | Admitting: Oncology

## 2016-12-04 ENCOUNTER — Telehealth: Payer: Self-pay | Admitting: Oncology

## 2016-12-04 ENCOUNTER — Ambulatory Visit (HOSPITAL_COMMUNITY)
Admission: RE | Admit: 2016-12-04 | Discharge: 2016-12-04 | Disposition: A | Discharge status: Home / Self Care | Payer: Medicare Other | Source: Ambulatory Visit | Attending: Radiology | Admitting: Radiology

## 2016-12-04 VITALS — BP 124/71 | HR 75 | Temp 97.9°F | Resp 17 | Ht 75.0 in | Wt 173.9 lb

## 2016-12-04 DIAGNOSIS — J90 Pleural effusion, not elsewhere classified: Secondary | ICD-10-CM | POA: Insufficient documentation

## 2016-12-04 DIAGNOSIS — C155 Malignant neoplasm of lower third of esophagus: Secondary | ICD-10-CM

## 2016-12-04 DIAGNOSIS — Z9889 Other specified postprocedural states: Secondary | ICD-10-CM | POA: Diagnosis not present

## 2016-12-04 DIAGNOSIS — I10 Essential (primary) hypertension: Secondary | ICD-10-CM

## 2016-12-04 DIAGNOSIS — Z95828 Presence of other vascular implants and grafts: Secondary | ICD-10-CM

## 2016-12-04 DIAGNOSIS — R5383 Other fatigue: Secondary | ICD-10-CM | POA: Diagnosis not present

## 2016-12-04 DIAGNOSIS — J948 Other specified pleural conditions: Secondary | ICD-10-CM | POA: Diagnosis not present

## 2016-12-04 LAB — COMPREHENSIVE METABOLIC PANEL
ALBUMIN: 2.6 g/dL — AB (ref 3.5–5.0)
ALK PHOS: 98 U/L (ref 40–150)
ALT: 13 U/L (ref 0–55)
AST: 21 U/L (ref 5–34)
Anion Gap: 6 mEq/L (ref 3–11)
BUN: 22 mg/dL (ref 7.0–26.0)
CALCIUM: 9 mg/dL (ref 8.4–10.4)
CHLORIDE: 106 meq/L (ref 98–109)
CO2: 30 mEq/L — ABNORMAL HIGH (ref 22–29)
Creatinine: 0.9 mg/dL (ref 0.7–1.3)
EGFR: 79 mL/min/{1.73_m2} — AB (ref >90)
Glucose: 124 mg/dl (ref 70–140)
POTASSIUM: 3.8 meq/L (ref 3.5–5.1)
Sodium: 142 mEq/L (ref 136–145)
Total Bilirubin: 0.7 mg/dL (ref 0.20–1.20)
Total Protein: 5.9 g/dL — ABNORMAL LOW (ref 6.4–8.3)

## 2016-12-04 LAB — CBC WITH DIFFERENTIAL/PLATELET
BASO%: 0.8 % (ref 0.0–2.0)
BASOS ABS: 0 10*3/uL (ref 0.0–0.1)
EOS%: 1.6 % (ref 0.0–7.0)
Eosinophils Absolute: 0.1 10*3/uL (ref 0.0–0.5)
HCT: 37.3 % — ABNORMAL LOW (ref 38.4–49.9)
HEMOGLOBIN: 12 g/dL — AB (ref 13.0–17.1)
LYMPH%: 9.4 % — ABNORMAL LOW (ref 14.0–49.0)
MCH: 33.4 pg (ref 27.2–33.4)
MCHC: 32.2 g/dL (ref 32.0–36.0)
MCV: 103.9 fL — ABNORMAL HIGH (ref 79.3–98.0)
MONO#: 0.3 10*3/uL (ref 0.1–0.9)
MONO%: 8.9 % (ref 0.0–14.0)
NEUT%: 79.3 % — ABNORMAL HIGH (ref 39.0–75.0)
NEUTROS ABS: 2.9 10*3/uL (ref 1.5–6.5)
Platelets: 149 10*3/uL (ref 140–400)
RBC: 3.59 10*6/uL — AB (ref 4.20–5.82)
RDW: 15.5 % — AB (ref 11.0–14.6)
WBC: 3.7 10*3/uL — AB (ref 4.0–10.3)
lymph#: 0.4 10*3/uL — ABNORMAL LOW (ref 0.9–3.3)

## 2016-12-04 MED ORDER — LIDOCAINE HCL (PF) 1 % IJ SOLN
INTRAMUSCULAR | Status: AC
  Filled 2016-12-04: qty 30

## 2016-12-04 MED ORDER — SODIUM CHLORIDE 0.9% FLUSH
10.0000 mL | Freq: Once | INTRAVENOUS | Status: AC
  Administered 2016-12-04: 10 mL
  Filled 2016-12-04: qty 10

## 2016-12-04 MED ORDER — SODIUM CHLORIDE 0.9% FLUSH
10.0000 mL | INTRAVENOUS | Status: DC | PRN
  Administered 2016-12-04: 10 mL via INTRAVENOUS
  Filled 2016-12-04: qty 10

## 2016-12-04 MED ORDER — HEPARIN SOD (PORK) LOCK FLUSH 100 UNIT/ML IV SOLN
500.0000 [IU] | Freq: Once | INTRAVENOUS | Status: AC
  Administered 2016-12-04: 500 [IU] via INTRAVENOUS
  Filled 2016-12-04: qty 5

## 2016-12-04 NOTE — Progress Notes (Signed)
[facility] OFFICE PROGRESS NOTE   Diagnosis: Esophagus cancer  INTERVAL HISTORY:   David [name] returns for a scheduled visit. He reports increased fatigue. He has exertional dyspnea. [place]  Objective:  Vital signs in last 24 hours:  Blood pressure 124/71, pulse 75, temperature 97.9 F (36.6 C), temperature source Oral, resp. rate 17, height '6\' 3"'  (1.905 m), weight 173 lb 14.4 oz (78.9 kg), SpO2 97 %.    HEENT: Neck without mass Lymphatics: No cervical or supra-clavicular nodes Resp: Good air movement bilaterally, no respiratory distress, dullness to percussion at the right greater than left lower chest Cardio: Regular rate and rhythm GI: No hepatosplenomegaly, nontender, no mass Vascular: No leg edema   Portacath/PICC-without erythema  Lab Results:  Lab Results  Component Value Date   WBC 3.7 (L) 12/04/2016   HGB 12.0 (L) 12/04/2016   HCT 37.3 (L) 12/04/2016   MCV 103.9 (H) 12/04/2016   PLT 149 12/04/2016   NEUTROABS 2.9 12/04/2016    CMP     Component Value Date/Time   NA 142 12/04/2016 0834   K 3.8 12/04/2016 0834   CL 102 09/06/2016 0947   CO2 30 (H) 12/04/2016 0834   GLUCOSE 124 12/04/2016 0834   BUN 22.0 12/04/2016 0834   CREATININE 0.9 12/04/2016 0834   CALCIUM 9.0 12/04/2016 0834   PROT 5.9 (L) 12/04/2016 0834   ALBUMIN 2.6 (L) 12/04/2016 0834   AST 21 12/04/2016 0834   ALT 13 12/04/2016 0834   ALKPHOS 98 12/04/2016 0834   BILITOT 0.70 12/04/2016 0834   GFRNONAA >60 09/06/2016 0947   GFRAA >60 09/06/2016 0947     Imaging:  Ct Chest W Contrast  Result Date: 12/01/2016 CLINICAL DATA:  Esophageal cancer.  Follow-up. EXAM: CT CHEST WITH CONTRAST TECHNIQUE: Multidetector CT imaging of the chest was performed during intravenous contrast administration. CONTRAST:  34m ISOVUE-300 IOPAMIDOL (ISOVUE-300) INJECTION 61% COMPARISON:  CT chest 07/17/2016 FINDINGS: Cardiovascular: The heart size appears  normal. Aortic atherosclerosis identified. Calcification in the LAD RCA coronary artery noted. No pericardial effusion. Mediastinum/Nodes: No axillary or supraclavicular adenopathy. There is no mediastinal or hilar adenopathy identified. The trachea appears patent and is midline. Status post esophagectomy with gastric pull through. Lungs/Pleura: There are moderate to large bilateral pleural effusions identified. There is compressive type atelectasis overlying both pleural effusions. Peripheral tree-in-bud nodules within the right middle lobe are identified as well as within the right upper lobe which likely represent distal areas of bronchial impaction. This may be inflammatory or infectious in etiology. Stable subpleural nodule overlying the anterior left upper lobe measuring 5 mm. Small parenchymal nodule in the left upper lobe is unchanged measuring 2 mm. Two small nodules in the left upper lobe are unchanged measuring up to 5 mm, image 81 of series 7. Upper Abdomen: Posterior right lobe of liver mass measures 4.5 cm, image 169 of series 2. Increased from 3.2 cm previously. Musculoskeletal: No aggressive lytic or sclerotic bone lesions identified. IMPRESSION: 1. Metastasis within the posterior right lobe of liver has increased in size from previous exam. 2. Persistent moderate to large bilateral pleural effusions. Stable to slightly increased in the interval. 3. Scattered nonspecific pulmonary nodules, some of which have a tree-in-bud morphology are favored to represent a benign process such as infectious bronchiolitis. 4. Status post esophagectomy and gastric pull through. 5. Aortic Atherosclerosis (ICD10-I70.0). Coronary artery calcifications noted. Electronically Signed   By: TQueen SloughD.  On: 12/01/2016 08:46    Medications: I have reviewed the patient's current medications.  Assessment/Plan: 1. Adenocarcinoma of the distal esophagus,[id number]  Presenting with a food impaction and dysphagia  08/20/2014  Staging CT scans of the chest, abdomen, and pelvis 10/06/2014 with indeterminate pulmonary nodules, a 9 mm paraesophageal lymph node, and circumferential thickening of the distal esophagus  PET scan 10/23/2014 confirmed hypermetabolic thickening at the distal esophagus with small paraesophageal nodes having faint nonspecific activity. A pleural-based focus of consolidation at the posterior right lower lobe had low metabolic activity-favored to be a benign process.  Initiation of concurrent weekly Taxol/carboplatin and radiation on 11/02/2014, radiation completed 12/09/2014  CTs 12/31/2014 revealed distal esophageal wall thickening with adjacent small lymph nodes suspicious for metastases, stable lung nodule posterior to the right bronchus intermedius and the persistent 5 mm nodule in the right lower lobe  Esophagectomy 02/01/2015 with the pathology confirming a ypT3,ypN3 tumor, positive distal margin  MSI-stable, tumor mutation burden-5  CT chest 07/17/2016-bilateral pleural effusions, Bibasilar airspace opacities with peribronchial vascular nodularity, new low-density right liver lesion  Left thoracentesis 07/19/2016-cytology showedreactive mesothelial cells; mixed lymphoid population  Ultrasound-guided biopsy of a segment 8 liver lesion on 08/01/2016-adenocarcinoma  Cycle 1 FOLFOX 09/06/2016  Cycle 2 FOLFOX 09/21/2016  Cycle 3 FOLFOX 10/05/2016  Cycle 4 FOLFOX 11/02/2016  Cycle 5 FOLFOX 11/20/2016  CT chest 12/01/2016-increased size of right liver lesion, persistent bilateral pleural effusions  2. Prostatic hypertrophy-Status post TUR 01/26/2016  3. Hypertension  4. History of multiple basal cell skin cancers  5. History of thrombocytopenia secondary to chemotherapy  6. PVCs/bicuspid aortic valve-followed by cardiology  7. Port-A-Cath placement 09/06/2016  8. Mild neutropenia following cycle 3 FOLFOX, Neulasta added with cycle 4  9.   Bilateral pleural effusions, status post right thoracentesis 11/10/2016, left thoracentesis 11/15/2016  Cytology from 11/15/2016-negative    Disposition:  Mr. Choe completed 5 cycles of FOLFOX. His clinical status has declined. He has persistent symptomatic bilateral pleural effusions. The restaging CT reveals enlargement of a right liver lesion.  I reviewed the CT images and discussed treatment options with Mr. Dunsworth. He understands no therapy will be curative. He understands the pleural effusions are likely malignant despite the negative cytologies.  He will be referred for palliative left and right thoracentesis procedures this week. Pleural fluid will be resubmitted for cytology evaluation. We discussed the possibility of a Pleurx placement if he continues to require frequent thoracentesis procedures.  We discussed comfort care versus a trial of salvage therapy. I recommend Taxol/ ramucirumab versus supportive care. He would like to proceed with salvage therapy. We reviewed the potential toxicities associated with Taxol/ramucirumab regimen including the chance for neuropathy, and allergic reaction, hematologic toxicity, thromboembolic disease, hypertension, and bleeding. He would like to proceed with salvage therapy.  The plan is to begin Taxol/ramucirumab on 12/11/2016. He will be scheduled for an office visit with day 8 Taxol.  40 minutes were spent with the patient today. The majority of the time was used for counseling and coordination of care.  Donneta Romberg, MD  12/04/2016  9:17 AM

## 2016-12-04 NOTE — Procedures (Signed)
Ultrasound-guided diagnostic and therapeutic right thoracentesis performed yielding 2.2 liters of yellow  fluid. No immediate complications. Follow-up chest x-ray pending. A portion of the fluid was sent to the lab for cytology.

## 2016-12-04 NOTE — Telephone Encounter (Signed)
Scheduled appt per 8/13 los - Gave patient AVS and calender per los.  

## 2016-12-04 NOTE — Progress Notes (Signed)
DISCONTINUE ON PATHWAY REGIMEN - Gastroesophageal     A cycle is every 14 days:     Oxaliplatin      Leucovorin      5-Fluorouracil      5-Fluorouracil   **Always confirm dose/schedule in your pharmacy ordering system**    REASON: Disease Progression PRIOR TREATMENT: GEOS3: mFOLFOX6 q14 Days Until Progression or Unacceptable Toxicity TREATMENT RESPONSE: Progressive Disease (PD)  START ON PATHWAY REGIMEN - Gastroesophageal     A cycle is every 28 days:     Ramucirumab      Paclitaxel   **Always confirm dose/schedule in your pharmacy ordering system**    Patient Characteristics: Distant Metastases (cM1/pM1) / Locally Recurrent Disease, Adenocarcinoma - Esophageal, GE Junction, and Gastric, Second Line, MSS / pMMR or MSI Unknown Histology: Adenocarcinoma Disease Classification: Esophageal Therapeutic Status: Distant Metastases (No Additional Staging) Would you be surprised if this patient died  in the next year? I would NOT be surprised if this patient died in the next year Line of therapy: Second Line Microsatellite/Mismatch Repair Status: Unknown Intent of Therapy: Non-Curative / Palliative Intent, Not Discussed with Patient

## 2016-12-06 ENCOUNTER — Ambulatory Visit (HOSPITAL_COMMUNITY): Payer: Medicare Other

## 2016-12-06 ENCOUNTER — Ambulatory Visit (HOSPITAL_COMMUNITY)
Admission: RE | Admit: 2016-12-06 | Discharge: 2016-12-06 | Disposition: A | Discharge status: Home / Self Care | Payer: Medicare Other | Source: Ambulatory Visit | Attending: Oncology | Admitting: Oncology

## 2016-12-06 ENCOUNTER — Ambulatory Visit (HOSPITAL_COMMUNITY)
Admission: RE | Admit: 2016-12-06 | Discharge: 2016-12-06 | Disposition: A | Discharge status: Home / Self Care | Payer: Medicare Other | Source: Ambulatory Visit | Attending: Radiology | Admitting: Radiology

## 2016-12-06 DIAGNOSIS — J90 Pleural effusion, not elsewhere classified: Secondary | ICD-10-CM | POA: Insufficient documentation

## 2016-12-06 DIAGNOSIS — C155 Malignant neoplasm of lower third of esophagus: Secondary | ICD-10-CM | POA: Diagnosis not present

## 2016-12-06 DIAGNOSIS — Z9889 Other specified postprocedural states: Secondary | ICD-10-CM | POA: Diagnosis not present

## 2016-12-06 DIAGNOSIS — R846 Abnormal cytological findings in specimens from respiratory organs and thorax: Secondary | ICD-10-CM | POA: Diagnosis not present

## 2016-12-06 MED ORDER — LIDOCAINE HCL 1 % IJ SOLN
INTRAMUSCULAR | Status: AC
  Filled 2016-12-06: qty 20

## 2016-12-06 NOTE — Procedures (Addendum)
Ultrasound-guided diagnostic and therapeutic left thoracentesis performed yielding 1.9 liters of blood-tinged fluid. No immediate complications.. A portion of the fluid was sent to the lab for cytology. Postprocedure chest x-ray revealed an approximately 10-15% left pneumothorax, probable ex vacuo in origin. Patient currently with minimal discomfort. He was instructed to return to the ED with any worsening chest pain. Case discussed with Dr. Annamaria Boots.

## 2016-12-10 ENCOUNTER — Other Ambulatory Visit: Payer: Self-pay | Admitting: Oncology

## 2016-12-11 ENCOUNTER — Telehealth: Payer: Self-pay | Admitting: *Deleted

## 2016-12-11 NOTE — Telephone Encounter (Signed)
Per Dr. Benay Spice. No labs needed for treatment. OK to treat with 12/04/16 CBC/CMET. First Taxol/ Ramcirumab 8/21. (pt received Taxol in the past)

## 2016-12-12 ENCOUNTER — Ambulatory Visit (HOSPITAL_BASED_OUTPATIENT_CLINIC_OR_DEPARTMENT_OTHER): Payer: Medicare Other

## 2016-12-12 ENCOUNTER — Other Ambulatory Visit (HOSPITAL_BASED_OUTPATIENT_CLINIC_OR_DEPARTMENT_OTHER): Payer: Medicare Other

## 2016-12-12 VITALS — BP 121/74 | HR 82 | Temp 97.8°F | Resp 20

## 2016-12-12 DIAGNOSIS — C787 Secondary malignant neoplasm of liver and intrahepatic bile duct: Secondary | ICD-10-CM

## 2016-12-12 DIAGNOSIS — C155 Malignant neoplasm of lower third of esophagus: Secondary | ICD-10-CM

## 2016-12-12 DIAGNOSIS — Z5112 Encounter for antineoplastic immunotherapy: Secondary | ICD-10-CM | POA: Diagnosis not present

## 2016-12-12 DIAGNOSIS — Z5111 Encounter for antineoplastic chemotherapy: Secondary | ICD-10-CM | POA: Diagnosis not present

## 2016-12-12 DIAGNOSIS — Z79899 Other long term (current) drug therapy: Secondary | ICD-10-CM | POA: Diagnosis present

## 2016-12-12 LAB — UA PROTEIN, DIPSTICK - CHCC

## 2016-12-12 MED ORDER — DEXAMETHASONE SODIUM PHOSPHATE 10 MG/ML IJ SOLN
INTRAMUSCULAR | Status: AC
  Filled 2016-12-12: qty 1

## 2016-12-12 MED ORDER — DIPHENHYDRAMINE HCL 50 MG/ML IJ SOLN
25.0000 mg | Freq: Once | INTRAMUSCULAR | Status: AC
  Administered 2016-12-12: 25 mg via INTRAVENOUS

## 2016-12-12 MED ORDER — SODIUM CHLORIDE 0.9 % IV SOLN
Freq: Once | INTRAVENOUS | Status: AC
  Administered 2016-12-12: 10:00:00 via INTRAVENOUS

## 2016-12-12 MED ORDER — FAMOTIDINE IN NACL 20-0.9 MG/50ML-% IV SOLN
INTRAVENOUS | Status: AC
  Filled 2016-12-12: qty 50

## 2016-12-12 MED ORDER — SODIUM CHLORIDE 0.9 % IV SOLN
8.0000 mg/kg | Freq: Once | INTRAVENOUS | Status: AC
  Administered 2016-12-12: 600 mg via INTRAVENOUS
  Filled 2016-12-12: qty 50

## 2016-12-12 MED ORDER — SODIUM CHLORIDE 0.9% FLUSH
10.0000 mL | INTRAVENOUS | Status: DC | PRN
  Administered 2016-12-12: 10 mL
  Filled 2016-12-12: qty 10

## 2016-12-12 MED ORDER — DEXAMETHASONE SODIUM PHOSPHATE 100 MG/10ML IJ SOLN: 10.0000 mg | Freq: Once | INTRAMUSCULAR | Status: DC

## 2016-12-12 MED ORDER — ACETAMINOPHEN 325 MG PO TABS
ORAL_TABLET | ORAL | Status: AC
  Filled 2016-12-12: qty 2

## 2016-12-12 MED ORDER — PACLITAXEL CHEMO INJECTION 300 MG/50ML
80.0000 mg/m2 | Freq: Once | INTRAVENOUS | Status: AC
  Administered 2016-12-12: 162 mg via INTRAVENOUS
  Filled 2016-12-12: qty 27

## 2016-12-12 MED ORDER — FAMOTIDINE IN NACL 20-0.9 MG/50ML-% IV SOLN
20.0000 mg | Freq: Once | INTRAVENOUS | Status: AC
  Administered 2016-12-12: 20 mg via INTRAVENOUS

## 2016-12-12 MED ORDER — HEPARIN SOD (PORK) LOCK FLUSH 100 UNIT/ML IV SOLN
500.0000 [IU] | Freq: Once | INTRAVENOUS | Status: AC | PRN
  Administered 2016-12-12: 500 [IU]
  Filled 2016-12-12: qty 5

## 2016-12-12 MED ORDER — DIPHENHYDRAMINE HCL 50 MG/ML IJ SOLN
INTRAMUSCULAR | Status: AC
  Filled 2016-12-12: qty 1

## 2016-12-12 MED ORDER — ACETAMINOPHEN 325 MG PO TABS
650.0000 mg | ORAL_TABLET | Freq: Once | ORAL | Status: AC
  Administered 2016-12-12: 650 mg via ORAL

## 2016-12-12 MED ORDER — DEXAMETHASONE SODIUM PHOSPHATE 10 MG/ML IJ SOLN
10.0000 mg | Freq: Once | INTRAMUSCULAR | Status: AC
  Administered 2016-12-12: 10 mg via INTRAVENOUS

## 2016-12-12 NOTE — Patient Instructions (Signed)
[facility] Discharge Instructions for Patients Receiving Chemotherapy  Today you received the following chemotherapy agents Taxol/Cyramza  To help prevent nausea and vomiting after your treatment, we encourage you to take your nausea medication    If you develop nausea and vomiting that is not controlled by your nausea medication, call the clinic.   BELOW ARE SYMPTOMS THAT SHOULD BE REPORTED IMMEDIATELY:  *FEVER GREATER THAN 100.5 F  *CHILLS WITH OR WITHOUT FEVER  NAUSEA AND VOMITING THAT IS NOT CONTROLLED WITH YOUR NAUSEA MEDICATION  *UNUSUAL SHORTNESS OF BREATH  *UNUSUAL BRUISING OR BLEEDING  TENDERNESS IN MOUTH AND THROAT WITH OR WITHOUT PRESENCE OF ULCERS  *URINARY PROBLEMS  *BOWEL PROBLEMS  UNUSUAL RASH Items with * indicate a potential emergency and should be followed up as soon as possible.  Feel free to call the clinic you have any questions or concerns. The clinic phone number is [phone].  Please show the [place] at check-in to the Emergency Department and triage nurse.  Paclitaxel injection What is this medicine? PACLITAXEL (PAK li TAX el) is a chemotherapy drug. It targets fast dividing cells, like cancer cells, and causes these cells to die. This medicine is used to treat ovarian cancer, breast cancer, and other cancers. This medicine may be used for other purposes; ask your health care provider or pharmacist if you have questions. COMMON BRAND NAME(S): Onxol, Taxol What should I tell my health care provider before I take this medicine? They need to know if you have any of these conditions: -blood disorders -irregular heartbeat -infection (especially a virus infection such as chickenpox, cold sores, or herpes) -liver disease -previous or ongoing radiation therapy -an unusual or allergic reaction to paclitaxel, alcohol, polyoxyethylated castor oil, other chemotherapy agents, other medicines, foods, dyes, or  preservatives -pregnant or trying to get pregnant -breast-feeding How should I use this medicine? This drug is given as an infusion into a vein. It is administered in a hospital or clinic by a specially trained health care professional. Talk to your pediatrician regarding the use of this medicine in children. Special care may be needed. Overdosage: If you think you have taken too much of this medicine contact a poison control center or emergency room at once. NOTE: This medicine is only for you. Do not share this medicine with others. What if I miss a dose? It is important not to miss your dose. Call your doctor or health care professional if you are unable to keep an appointment. What may interact with this medicine? Do not take this medicine with any of the following medications: -disulfiram -metronidazole This medicine may also interact with the following medications: -cyclosporine -diazepam -ketoconazole -medicines to increase blood counts like filgrastim, pegfilgrastim, sargramostim -other chemotherapy drugs like cisplatin, doxorubicin, epirubicin, etoposide, teniposide, vincristine -quinidine -testosterone -vaccines -verapamil Talk to your doctor or health care professional before taking any of these medicines: -acetaminophen -aspirin -ibuprofen -ketoprofen -naproxen This list may not describe all possible interactions. Give your health care provider a list of all the medicines, herbs, non-prescription drugs, or dietary supplements you use. Also tell them if you smoke, drink alcohol, or use illegal drugs. Some items may interact with your medicine. What should I watch for while using this medicine? Your condition will be monitored carefully while you are receiving this medicine. You will need important blood work done while you are taking this medicine. This medicine can cause serious allergic reactions. To reduce your risk you will need to take other medicine(s)  before  treatment with this medicine. If you experience allergic reactions like skin rash, itching or hives, swelling of the face, lips, or tongue, tell your doctor or health care professional right away. In some cases, you may be given additional medicines to help with side effects. Follow all directions for their use. This drug may make you feel generally unwell. This is not uncommon, as chemotherapy can affect healthy cells as well as cancer cells. Report any side effects. Continue your course of treatment even though you feel ill unless your doctor tells you to stop. Call your doctor or health care professional for advice if you get a fever, chills or sore throat, or other symptoms of a cold or flu. Do not treat yourself. This drug decreases your body's ability to fight infections. Try to avoid being around people who are sick. This medicine may increase your risk to bruise or bleed. Call your doctor or health care professional if you notice any unusual bleeding. Be careful brushing and flossing your teeth or using a toothpick because you may get an infection or bleed more easily. If you have any dental work done, tell your dentist you are receiving this medicine. Avoid taking products that contain aspirin, acetaminophen, ibuprofen, naproxen, or ketoprofen unless instructed by your doctor. These medicines may hide a fever. Do not become pregnant while taking this medicine. Women should inform their doctor if they wish to become pregnant or think they might be pregnant. There is a potential for serious side effects to an unborn child. Talk to your health care professional or pharmacist for more information. Do not breast-feed an infant while taking this medicine. Men are advised not to father a child while receiving this medicine. This product may contain alcohol. Ask your pharmacist or healthcare provider if this medicine contains alcohol. Be sure to tell all healthcare providers you are taking this medicine.  Certain medicines, like metronidazole and disulfiram, can cause an unpleasant reaction when taken with alcohol. The reaction includes flushing, headache, nausea, vomiting, sweating, and increased thirst. The reaction can last from 30 minutes to several hours. What side effects may I notice from receiving this medicine? Side effects that you should report to your doctor or health care professional as soon as possible: -allergic reactions like skin rash, itching or hives, swelling of the face, lips, or tongue -low blood counts - This drug may decrease the number of white blood cells, red blood cells and platelets. You may be at increased risk for infections and bleeding. -signs of infection - fever or chills, cough, sore throat, pain or difficulty passing urine -signs of decreased platelets or bleeding - bruising, pinpoint red spots on the skin, black, tarry stools, nosebleeds -signs of decreased red blood cells - unusually weak or tired, fainting spells, lightheadedness -breathing problems -chest pain -high or low blood pressure -mouth sores -nausea and vomiting -pain, swelling, redness or irritation at the injection site -pain, tingling, numbness in the hands or feet -slow or irregular heartbeat -swelling of the ankle, feet, hands Side effects that usually do not require medical attention (report to your doctor or health care professional if they continue or are bothersome): -bone pain -complete hair loss including hair on your head, underarms, pubic hair, eyebrows, and eyelashes -changes in the color of fingernails -diarrhea -loosening of the fingernails -loss of appetite -muscle or joint pain -red flush to skin -sweating This list may not describe all possible side effects. Call your doctor for medical advice about side effects. You   may report side effects to FDA at 1-800-FDA-1088. Where should I keep my medicine? This drug is given in a hospital or clinic and will not be stored at  home. NOTE: This sheet is a summary. It may not cover all possible information. If you have questions about this medicine, talk to your doctor, pharmacist, or health care provider.  2018 Elsevier/Gold Standard (2015-02-09 19:58:00)  Ramucirumab injection What is this medicine? RAMUCIRUMAB (ra mue SIR ue mab) is a monoclonal antibody. It is used to treat stomach cancer, colorectal cancer, or lung cancer. This medicine may be used for other purposes; ask your health care provider or pharmacist if you have questions. COMMON BRAND NAME(S): Cyramza What should I tell my health care provider before I take this medicine? They need to know if you have any of these conditions: -bleeding disorders -blood clots -heart disease, including heart failure, heart attack, or chest pain (angina) -high blood pressure -infection (especially a virus infection such as chickenpox, cold sores, or herpes) -protein in your urine -recent surgery -stroke -an unusual or allergic reaction to ramucirumab, other medicines, foods, dyes, or preservatives -pregnant or trying to get pregnant -breast-feeding How should I use this medicine? This medicine is for infusion into a vein. It is given by a health care professional in a hospital or clinic setting. Talk to your pediatrician regarding the use of this medicine in children. Special care may be needed. Overdosage: If you think you have taken too much of this medicine contact a poison control center or emergency room at once. NOTE: This medicine is only for you. Do not share this medicine with others. What if I miss a dose? It is important not to miss your dose. Call your doctor or health care professional if you are unable to keep an appointment. What may interact with this medicine? Interactions have not been studied. This list may not describe all possible interactions. Give your health care provider a list of all the medicines, herbs, non-prescription drugs, or  dietary supplements you use. Also tell them if you smoke, drink alcohol, or use illegal drugs. Some items may interact with your medicine. What should I watch for while using this medicine? Your condition will be monitored carefully while you are receiving this medicine. You will need to to check your blood pressure and have your blood and urine tested while you are taking this medicine. Your condition will be monitored carefully while you are receiving this medicine. This medicine may increase your risk to bruise or bleed. Call your doctor or health care professional if you notice any unusual bleeding. This medicine may rarely cause 'gastrointestinal perforation' (holes in the stomach, intestines or colon), a serious side effect requiring surgery to repair. This medicine should be started at least 28 days following major surgery and the site of the surgery should be totally healed. Check with your doctor before scheduling dental work or surgery while you are receiving this treatment. Talk to your doctor if you have recently had surgery or if you have a wound that has not healed. Do not become pregnant while taking this medicine or for 3 months after stopping it. Women should inform their doctor if they wish to become pregnant or think they might be pregnant. There is a potential for serious side effects to an unborn child. Talk to your health care professional or pharmacist for more information. What side effects may I notice from receiving this medicine? Side effects that you should report to your doctor or  health care professional as soon as possible: -allergic reactions like skin rash, itching or hives, breathing problems, swelling of the face, lips, or tongue -signs of infection - fever or chills, cough, sore throat -chest pain or chest tightness -confusion -dizziness -feeling faint or lightheaded, falls -severe abdominal pain -severe nausea, vomiting -signs and symptoms of bleeding such as  bloody or black, tarry stools; red or dark-brown urine; spitting up blood or brown material that looks like coffee grounds; red spots on the skin; unusual bruising or bleeding from the eye, gums, or nose -signs and symptoms of a blood clot such as breathing problems; changes in vision; chest pain; severe, sudden headache; pain, swelling, warmth in the leg; trouble speaking; sudden numbness or weakness of the face, arm or leg -symptoms of a stroke: change in mental awareness, inability to talk or move one side of the body -trouble walking, dizziness, loss of balance or coordination Side effects that usually do not require medical attention (report to your doctor or health care professional if they continue or are bothersome): -cold, clammy skin -constipation -diarrhea -headache -nausea, vomiting -stomach pain -unusually slow heartbeat -unusually weak or tired This list may not describe all possible side effects. Call your doctor for medical advice about side effects. You may report side effects to FDA at 1-800-FDA-1088. Where should I keep my medicine? This drug is given in a hospital or clinic and will not be stored at home. NOTE: This sheet is a summary. It may not cover all possible information. If you have questions about this medicine, talk to your doctor, pharmacist, or health care provider.  2018 Elsevier/Gold Standard (2015-05-13 08:20:29)

## 2016-12-18 ENCOUNTER — Encounter: Payer: Self-pay | Admitting: Cardiothoracic Surgery

## 2016-12-18 ENCOUNTER — Encounter: Payer: Self-pay | Admitting: Nurse Practitioner

## 2016-12-18 ENCOUNTER — Encounter: Payer: Self-pay | Admitting: Oncology

## 2016-12-19 ENCOUNTER — Encounter (HOSPITAL_COMMUNITY): Payer: Self-pay | Admitting: Radiology

## 2016-12-19 ENCOUNTER — Telehealth: Payer: Self-pay | Admitting: Nurse Practitioner

## 2016-12-19 ENCOUNTER — Other Ambulatory Visit: Payer: Self-pay | Admitting: Nurse Practitioner

## 2016-12-19 ENCOUNTER — Ambulatory Visit (HOSPITAL_COMMUNITY)
Admission: RE | Admit: 2016-12-19 | Discharge: 2016-12-19 | Disposition: A | Discharge status: Home / Self Care | Payer: Medicare Other | Source: Ambulatory Visit | Attending: Nurse Practitioner | Admitting: Nurse Practitioner

## 2016-12-19 ENCOUNTER — Other Ambulatory Visit: Payer: Self-pay | Admitting: Emergency Medicine

## 2016-12-19 ENCOUNTER — Ambulatory Visit (HOSPITAL_BASED_OUTPATIENT_CLINIC_OR_DEPARTMENT_OTHER): Payer: Medicare Other | Admitting: Nurse Practitioner

## 2016-12-19 ENCOUNTER — Other Ambulatory Visit (HOSPITAL_COMMUNITY): Payer: Self-pay | Admitting: Radiology

## 2016-12-19 ENCOUNTER — Ambulatory Visit (HOSPITAL_COMMUNITY)
Admission: RE | Admit: 2016-12-19 | Discharge: 2016-12-19 | Disposition: A | Discharge status: Home / Self Care | Payer: Medicare Other | Source: Ambulatory Visit | Attending: Radiology | Admitting: Radiology

## 2016-12-19 ENCOUNTER — Ambulatory Visit (HOSPITAL_BASED_OUTPATIENT_CLINIC_OR_DEPARTMENT_OTHER): Payer: Medicare Other

## 2016-12-19 ENCOUNTER — Other Ambulatory Visit (HOSPITAL_BASED_OUTPATIENT_CLINIC_OR_DEPARTMENT_OTHER): Payer: Medicare Other

## 2016-12-19 VITALS — BP 126/79 | HR 67 | Temp 98.2°F | Resp 18 | Ht 75.0 in | Wt 179.4 lb

## 2016-12-19 DIAGNOSIS — R0602 Shortness of breath: Secondary | ICD-10-CM | POA: Diagnosis not present

## 2016-12-19 DIAGNOSIS — C155 Malignant neoplasm of lower third of esophagus: Secondary | ICD-10-CM | POA: Diagnosis not present

## 2016-12-19 DIAGNOSIS — J90 Pleural effusion, not elsewhere classified: Secondary | ICD-10-CM | POA: Diagnosis not present

## 2016-12-19 DIAGNOSIS — R846 Abnormal cytological findings in specimens from respiratory organs and thorax: Secondary | ICD-10-CM | POA: Diagnosis not present

## 2016-12-19 DIAGNOSIS — C159 Malignant neoplasm of esophagus, unspecified: Secondary | ICD-10-CM | POA: Diagnosis not present

## 2016-12-19 DIAGNOSIS — Z5111 Encounter for antineoplastic chemotherapy: Secondary | ICD-10-CM

## 2016-12-19 DIAGNOSIS — J939 Pneumothorax, unspecified: Secondary | ICD-10-CM | POA: Diagnosis not present

## 2016-12-19 HISTORY — PX: IR THORACENTESIS ASP PLEURAL SPACE W/IMG GUIDE: IMG5380

## 2016-12-19 LAB — CBC WITH DIFFERENTIAL/PLATELET
BASO%: 1.7 % (ref 0.0–2.0)
BASOS ABS: 0.1 10*3/uL (ref 0.0–0.1)
EOS%: 1.8 % (ref 0.0–7.0)
Eosinophils Absolute: 0.1 10*3/uL (ref 0.0–0.5)
HCT: 38.7 % (ref 38.4–49.9)
HEMOGLOBIN: 12.8 g/dL — AB (ref 13.0–17.1)
LYMPH%: 10.1 % — ABNORMAL LOW (ref 14.0–49.0)
MCH: 34 pg — AB (ref 27.2–33.4)
MCHC: 33.1 g/dL (ref 32.0–36.0)
MCV: 102.7 fL — ABNORMAL HIGH (ref 79.3–98.0)
MONO#: 0.2 10*3/uL (ref 0.1–0.9)
MONO%: 6.3 % (ref 0.0–14.0)
NEUT#: 2.6 10*3/uL (ref 1.5–6.5)
NEUT%: 80.1 % — ABNORMAL HIGH (ref 39.0–75.0)
Platelets: 131 10*3/uL — ABNORMAL LOW (ref 140–400)
RBC: 3.76 10*6/uL — ABNORMAL LOW (ref 4.20–5.82)
RDW: 15.3 % — AB (ref 11.0–14.6)
WBC: 3.2 10*3/uL — AB (ref 4.0–10.3)
lymph#: 0.3 10*3/uL — ABNORMAL LOW (ref 0.9–3.3)

## 2016-12-19 MED ORDER — DIPHENHYDRAMINE HCL 50 MG/ML IJ SOLN
25.0000 mg | Freq: Once | INTRAMUSCULAR | Status: AC
  Administered 2016-12-19: 25 mg via INTRAVENOUS

## 2016-12-19 MED ORDER — DEXAMETHASONE SODIUM PHOSPHATE 10 MG/ML IJ SOLN
INTRAMUSCULAR | Status: AC
  Filled 2016-12-19: qty 1

## 2016-12-19 MED ORDER — DIPHENHYDRAMINE HCL 50 MG/ML IJ SOLN
INTRAMUSCULAR | Status: AC
  Filled 2016-12-19: qty 1

## 2016-12-19 MED ORDER — PACLITAXEL CHEMO INJECTION 300 MG/50ML
80.0000 mg/m2 | Freq: Once | INTRAVENOUS | Status: AC
  Administered 2016-12-19: 162 mg via INTRAVENOUS
  Filled 2016-12-19: qty 27

## 2016-12-19 MED ORDER — FAMOTIDINE IN NACL 20-0.9 MG/50ML-% IV SOLN
20.0000 mg | Freq: Once | INTRAVENOUS | Status: AC
  Administered 2016-12-19: 20 mg via INTRAVENOUS

## 2016-12-19 MED ORDER — DEXAMETHASONE SODIUM PHOSPHATE 10 MG/ML IJ SOLN
10.0000 mg | Freq: Once | INTRAMUSCULAR | Status: AC
  Administered 2016-12-19: 10 mg via INTRAVENOUS

## 2016-12-19 MED ORDER — FAMOTIDINE IN NACL 20-0.9 MG/50ML-% IV SOLN
INTRAVENOUS | Status: AC
  Filled 2016-12-19: qty 50

## 2016-12-19 MED ORDER — PANTOPRAZOLE SODIUM 20 MG PO TBEC: 20.0000 mg | DELAYED_RELEASE_TABLET | Freq: Every day | ORAL | 1 refills | Status: DC

## 2016-12-19 MED ORDER — SODIUM CHLORIDE 0.9 % IV SOLN
Freq: Once | INTRAVENOUS | Status: AC
  Administered 2016-12-19: 13:00:00 via INTRAVENOUS

## 2016-12-19 MED ORDER — HEPARIN SOD (PORK) LOCK FLUSH 100 UNIT/ML IV SOLN
500.0000 [IU] | Freq: Once | INTRAVENOUS | Status: AC | PRN
  Administered 2016-12-19: 500 [IU]
  Filled 2016-12-19: qty 5

## 2016-12-19 MED ORDER — SODIUM CHLORIDE 0.9% FLUSH
10.0000 mL | INTRAVENOUS | Status: DC | PRN
  Administered 2016-12-19: 10 mL
  Filled 2016-12-19: qty 10

## 2016-12-19 NOTE — Procedures (Signed)
Ultrasound-guided diagnostic and therapeutic right thoracentesis performed yielding 2.1 liters of yellow  fluid. No immediate complications. Follow-up chest x-ray pending. A portion of the fluid was submitted to the lab for cytology.

## 2016-12-19 NOTE — Patient Instructions (Signed)
[facility] Discharge Instructions for Patients Receiving Chemotherapy  Today you received the following chemotherapy agents Taxol   To help prevent nausea and vomiting after your treatment, we encourage you to take your nausea medication as directed.   If you develop nausea and vomiting that is not controlled by your nausea medication, call the clinic.   BELOW ARE SYMPTOMS THAT SHOULD BE REPORTED IMMEDIATELY:  *FEVER GREATER THAN 100.5 F  *CHILLS WITH OR WITHOUT FEVER  NAUSEA AND VOMITING THAT IS NOT CONTROLLED WITH YOUR NAUSEA MEDICATION  *UNUSUAL SHORTNESS OF BREATH  *UNUSUAL BRUISING OR BLEEDING  TENDERNESS IN MOUTH AND THROAT WITH OR WITHOUT PRESENCE OF ULCERS  *URINARY PROBLEMS  *BOWEL PROBLEMS  UNUSUAL RASH Items with * indicate a potential emergency and should be followed up as soon as possible.  Feel free to call the clinic you have any questions or concerns. The clinic phone number is [phone].  Please show the CHEMO ALERT CARD at check-in to the Emergency Department and triage nurse.   

## 2016-12-19 NOTE — Progress Notes (Addendum)
David [name]   Diagnosis:  Esophagus cancer  INTERVAL HISTORY:   David [name] returns as scheduled. He completed cycle 1 day 1 Taxol/ramucirumab 12/12/2016. He denies nausea. No mouth sores. No diarrhea. He noted transient numbness in both hands. This has resolved. He is more fatigued. He is more short of breath and would like to have a thoracentesis today.  Objective:  Vital signs in last 24 hours:  Blood pressure 126/79, pulse 67, temperature 98.2 F (36.8 C), temperature source Oral, resp. rate 18, height '6\' 3"'  (1.905 m), weight 179 lb 6.4 oz (81.4 kg), SpO2 100 %.    HEENT: No thrush or ulcers. Resp: Diminished breath sounds bilateral lower lung fields left greater than right. No respiratory distress. Cardio: Regular rate and rhythm. GI: Abdomen soft and nontender. No hepatosplenomegaly. Vascular: Trace edema lower legs bilaterally. Calves soft and nontender.  Port-A-Cath without erythema.    Lab Results:  Lab Results  Component Value Date   WBC 3.2 (L) 12/19/2016   HGB 12.8 (L) 12/19/2016   HCT 38.7 12/19/2016   MCV 102.7 (H) 12/19/2016   PLT 131 (L) 12/19/2016   NEUTROABS 2.6 12/19/2016    Imaging:  No results found.  Medications: I have reviewed the patient's current medications.  Assessment/Plan: 1. Adenocarcinoma of the distal esophagus,uT3uN2  Presenting with a food impaction and dysphagia 08/20/2014  Staging CT scans of the chest, abdomen, and pelvis 10/06/2014 with indeterminate pulmonary nodules, a 9 mm paraesophageal lymph node, and circumferential thickening of the distal esophagus  PET scan 10/23/2014 confirmed hypermetabolic thickening at the distal esophagus with small paraesophageal nodes having faint nonspecific activity. A pleural-based focus of consolidation at the posterior right lower lobe had low metabolic activity-favored to be a benign process.  Initiation of concurrent weekly Taxol/carboplatin and  radiation on 11/02/2014, radiation completed 12/09/2014  CTs 12/31/2014 revealed distal esophageal wall thickening with adjacent small lymph nodes suspicious for metastases, stable lung nodule posterior to the right bronchus intermedius and the persistent 5 mm nodule in the right lower lobe  Esophagectomy 02/01/2015 with the pathology confirming a ypT3,ypN3 tumor, positive distal margin  MSI-stable, tumor mutation burden-5  CT chest 07/17/2016-bilateral pleural effusions, Bibasilar airspace opacities with peribronchial vascular nodularity, new low-density right liver lesion  Left thoracentesis 07/19/2016-cytology showedreactive mesothelial cells; mixed lymphoid population  Ultrasound-guided biopsy of a segment 8 liver lesion on 08/01/2016-adenocarcinoma  Cycle 1 FOLFOX 09/06/2016  Cycle 2 FOLFOX 09/21/2016  Cycle 3 FOLFOX 10/05/2016  Cycle 4 FOLFOX 11/02/2016  Cycle 5 FOLFOX 11/20/2016  CT chest 12/01/2016-increased size of right liver lesion, persistent bilateral pleural effusions  Taxol and ramcirumab 12/12/2016; day 8 Taxol 12/19/2016   2. Prostatic hypertrophy-Status post TUR 01/26/2016  3. Hypertension  4. History of multiple basal cell skin cancers  5. History of thrombocytopenia secondary to chemotherapy  6. PVCs/bicuspid aortic valve-followed by cardiology  7. Port-A-Cath placement 09/06/2016  8. Mild neutropenia following cycle 3 FOLFOX, Neulasta added with cycle 4  9. Bilateral pleural effusions, status post right thoracentesis 11/10/2016, left thoracentesis 11/15/2016  Cytology from 11/15/2016-negative  Cytology from 12/04/2016 and 12/06/2016-negative   Disposition: David [name] appears unchanged. He tolerated cycle 1 day 1 Taxol/ramucirumab well. Plan to proceed with the 8 Taxol today as scheduled. He will return for day 15 Taxol/ramucirumab on 12/26/2016.   We referred him for a thoracentesis today. Fluid will be sent for cytology.    He will return for a follow-up visit and cycle 2 day 1 Taxol/ramucirumab on  01/09/2017. He will contact the office in the interim with any problems.  Patient seen with Dr. Benay Spice.    Ned Card ANP/GNP-BC   12/19/2016  12:17 PM   This was a shared visit with Ned Card. David [name] was interviewed and examined. He has symptomatic pleural effusions. He will be referred for repeat thoracentesis procedures this week.  The plan is to continue Taxol/ramucirumab therapy. Hopefully the fluid reaccumulation will slow with further systemic therapy.  He will return for an office visit prior to cycle 2.  25 minutes were spent with the patient today. The majority of the time was used for counseling and coordination of care.  Julieanne Manson, M.D.

## 2016-12-19 NOTE — Telephone Encounter (Signed)
Scheduled appt per 8/28 los - patient is aware of appts added and will pick up new schedule next visit.

## 2016-12-20 ENCOUNTER — Telehealth: Payer: Self-pay

## 2016-12-20 ENCOUNTER — Other Ambulatory Visit: Payer: Self-pay | Admitting: *Deleted

## 2016-12-20 DIAGNOSIS — R0602 Shortness of breath: Secondary | ICD-10-CM

## 2016-12-20 NOTE — Telephone Encounter (Signed)
Pt called stating he had thoracentesis yesterday on the right of 2.1 liters and the tech told him he has about the same amount of fluid on the left. He is requesting to be set up for thoracentsis on the left.

## 2016-12-20 NOTE — Addendum Note (Signed)
Addended by: Jethro Bolus A on: 12/20/2016 03:37 PM   Modules accepted: Orders

## 2016-12-20 NOTE — Telephone Encounter (Signed)
Telephone call returned to patient. Thoracentesis has been ordered and scheduled for 8/30 at 8:45am. Patient given directions to [facility] and confirms appointment. Patient understands to seek treatment if Shortness of breath worsens.

## 2016-12-21 ENCOUNTER — Ambulatory Visit (HOSPITAL_COMMUNITY)
Admission: RE | Admit: 2016-12-21 | Discharge: 2016-12-21 | Disposition: A | Discharge status: Home / Self Care | Payer: Medicare Other | Source: Ambulatory Visit | Attending: Nurse Practitioner | Admitting: Nurse Practitioner

## 2016-12-21 ENCOUNTER — Other Ambulatory Visit: Payer: Self-pay | Admitting: Nurse Practitioner

## 2016-12-21 DIAGNOSIS — Z8501 Personal history of malignant neoplasm of esophagus: Secondary | ICD-10-CM | POA: Insufficient documentation

## 2016-12-21 DIAGNOSIS — R0602 Shortness of breath: Secondary | ICD-10-CM

## 2016-12-21 DIAGNOSIS — J90 Pleural effusion, not elsewhere classified: Secondary | ICD-10-CM | POA: Insufficient documentation

## 2016-12-21 MED ORDER — LIDOCAINE HCL (PF) 1 % IJ SOLN
INTRAMUSCULAR | Status: AC
  Filled 2016-12-21: qty 30

## 2016-12-21 NOTE — Progress Notes (Signed)
Patient presented to radiology department for thoracentesis.  Patient had right-sided thoracentesis on Tuesday with improvement in symptoms.  Limited US Left Chest shows large reaccumulation of fluid; results dictated separately. However, patient also has a post-evacuo pneumothorax on the left side.  Discussed case with Dr. Pascal Lux. Fluid can be removed, however patient has developed pneumothorax with his last 2 procedures and is at high risk for recurrence. Discussed this with patient who states he is also trying to get a appointment with [facility] for Pleurx placement.  He states he is much improved after his procedure on the right and doesn't feel as symptomatic of the fluid on the left.  Discussed the merits of following his symptoms.  Patient states he would like to talk to Dr. Servando Snare prior to having procedure done on the left side. If he becomes symptomatic of his effusion and develops shortness of breath, patient understand he was call to have an appointment rescheduled. No procedure performed today.   Brynda Greathouse, MS RD PA-C 9:31 AM

## 2016-12-22 ENCOUNTER — Telehealth: Payer: Self-pay

## 2016-12-22 NOTE — Telephone Encounter (Signed)
S/w Clarise Cruz and she will discuss with Ned Card NP

## 2016-12-22 NOTE — Telephone Encounter (Signed)
Pt went to [facility] for L thoracentesis yesterday and was told he had a 10-15% collapse of L lung and an air pocket. He was told to talk with Dr Benay Spice, or maybe Dr Servando Snare. Pt is considering having pleurex catheters placed. He currrently has about 2 liters of fluid in L lung that they did not drain.

## 2016-12-22 NOTE — Telephone Encounter (Signed)
Telephone call returned to patient- Patient declined thoracentesis because of the increase risk for pneumothorax. Patient will monitor for symptoms this weekend and has a call into Dr. Everrett Coombe office for a follow up appt to discuss pleur ex drains. Patient understands he may call scheduling to make appt for thoracentesis. Patient remains asymptomatic at this time.

## 2016-12-25 ENCOUNTER — Encounter: Payer: Self-pay | Admitting: Oncology

## 2016-12-25 ENCOUNTER — Encounter: Payer: Self-pay | Admitting: Nurse Practitioner

## 2016-12-26 ENCOUNTER — Telehealth: Payer: Self-pay | Admitting: Oncology

## 2016-12-26 ENCOUNTER — Ambulatory Visit (HOSPITAL_BASED_OUTPATIENT_CLINIC_OR_DEPARTMENT_OTHER): Payer: Medicare Other

## 2016-12-26 ENCOUNTER — Ambulatory Visit: Payer: Medicare Other

## 2016-12-26 ENCOUNTER — Other Ambulatory Visit (HOSPITAL_BASED_OUTPATIENT_CLINIC_OR_DEPARTMENT_OTHER): Payer: Medicare Other

## 2016-12-26 ENCOUNTER — Other Ambulatory Visit: Payer: Self-pay | Admitting: Nurse Practitioner

## 2016-12-26 ENCOUNTER — Other Ambulatory Visit: Payer: Self-pay

## 2016-12-26 ENCOUNTER — Ambulatory Visit (HOSPITAL_COMMUNITY)
Admission: RE | Admit: 2016-12-26 | Discharge: 2016-12-26 | Disposition: A | Discharge status: Home / Self Care | Payer: Medicare Other | Source: Ambulatory Visit | Attending: Nurse Practitioner | Admitting: Nurse Practitioner

## 2016-12-26 ENCOUNTER — Telehealth: Payer: Self-pay | Admitting: *Deleted

## 2016-12-26 ENCOUNTER — Ambulatory Visit (HOSPITAL_COMMUNITY)
Admission: RE | Admit: 2016-12-26 | Discharge: 2016-12-26 | Disposition: A | Discharge status: Home / Self Care | Payer: Medicare Other | Source: Ambulatory Visit | Attending: Radiology | Admitting: Radiology

## 2016-12-26 ENCOUNTER — Telehealth: Payer: Self-pay

## 2016-12-26 DIAGNOSIS — R0602 Shortness of breath: Secondary | ICD-10-CM

## 2016-12-26 DIAGNOSIS — C155 Malignant neoplasm of lower third of esophagus: Secondary | ICD-10-CM

## 2016-12-26 DIAGNOSIS — J90 Pleural effusion, not elsewhere classified: Secondary | ICD-10-CM | POA: Diagnosis not present

## 2016-12-26 DIAGNOSIS — J9811 Atelectasis: Secondary | ICD-10-CM | POA: Insufficient documentation

## 2016-12-26 DIAGNOSIS — Z9889 Other specified postprocedural states: Secondary | ICD-10-CM | POA: Insufficient documentation

## 2016-12-26 DIAGNOSIS — J439 Emphysema, unspecified: Secondary | ICD-10-CM | POA: Diagnosis not present

## 2016-12-26 DIAGNOSIS — Z95828 Presence of other vascular implants and grafts: Secondary | ICD-10-CM

## 2016-12-26 LAB — CBC WITH DIFFERENTIAL/PLATELET
BASO%: 3.6 % — ABNORMAL HIGH (ref 0.0–2.0)
BASOS ABS: 0 10*3/uL (ref 0.0–0.1)
EOS%: 6 % (ref 0.0–7.0)
Eosinophils Absolute: 0.1 10*3/uL (ref 0.0–0.5)
HEMATOCRIT: 35.3 % — AB (ref 38.4–49.9)
HEMOGLOBIN: 11.4 g/dL — AB (ref 13.0–17.1)
LYMPH#: 0.3 10*3/uL — AB (ref 0.9–3.3)
LYMPH%: 39.3 % (ref 14.0–49.0)
MCH: 33.7 pg — AB (ref 27.2–33.4)
MCHC: 32.3 g/dL (ref 32.0–36.0)
MCV: 104.4 fL — AB (ref 79.3–98.0)
MONO#: 0.1 10*3/uL (ref 0.1–0.9)
MONO%: 10.7 % (ref 0.0–14.0)
NEUT#: 0.3 10*3/uL — CL (ref 1.5–6.5)
NEUT%: 40.4 % (ref 39.0–75.0)
PLATELETS: 126 10*3/uL — AB (ref 140–400)
RBC: 3.38 10*6/uL — ABNORMAL LOW (ref 4.20–5.82)
RDW: 14 % (ref 11.0–14.6)
WBC: 0.8 10*3/uL — CL (ref 4.0–10.3)
nRBC: 0 % (ref 0–0)

## 2016-12-26 LAB — COMPREHENSIVE METABOLIC PANEL
ALBUMIN: 2.3 g/dL — AB (ref 3.5–5.0)
ALK PHOS: 86 U/L (ref 40–150)
ALT: 17 U/L (ref 0–55)
ANION GAP: 4 meq/L (ref 3–11)
AST: 24 U/L (ref 5–34)
BUN: 27.6 mg/dL — AB (ref 7.0–26.0)
CALCIUM: 8.7 mg/dL (ref 8.4–10.4)
CO2: 31 mEq/L — ABNORMAL HIGH (ref 22–29)
Chloride: 109 mEq/L (ref 98–109)
Creatinine: 0.9 mg/dL (ref 0.7–1.3)
EGFR: 85 mL/min/{1.73_m2} — AB (ref >90)
Glucose: 95 mg/dl (ref 70–140)
POTASSIUM: 4.3 meq/L (ref 3.5–5.1)
Sodium: 144 mEq/L (ref 136–145)
Total Bilirubin: 0.52 mg/dL (ref 0.20–1.20)
Total Protein: 5.7 g/dL — ABNORMAL LOW (ref 6.4–8.3)

## 2016-12-26 MED ORDER — SODIUM CHLORIDE 0.9% FLUSH
10.0000 mL | INTRAVENOUS | Status: DC | PRN
  Administered 2016-12-26: 10 mL via INTRAVENOUS
  Filled 2016-12-26: qty 10

## 2016-12-26 MED ORDER — LIDOCAINE HCL 1 % IJ SOLN
INTRAMUSCULAR | Status: AC
  Filled 2016-12-26: qty 10

## 2016-12-26 MED ORDER — HEPARIN SOD (PORK) LOCK FLUSH 100 UNIT/ML IV SOLN
500.0000 [IU] | Freq: Once | INTRAVENOUS | Status: AC
  Administered 2016-12-26: 500 [IU] via INTRAVENOUS
  Filled 2016-12-26: qty 5

## 2016-12-26 MED ORDER — CIPROFLOXACIN HCL 500 MG PO TABS: 500.0000 mg | ORAL_TABLET | Freq: Two times a day (BID) | ORAL | 0 refills | Status: AC

## 2016-12-26 MED ORDER — SODIUM CHLORIDE 0.9% FLUSH
10.0000 mL | Freq: Once | INTRAVENOUS | Status: AC
  Administered 2016-12-26: 10 mL
  Filled 2016-12-26: qty 10

## 2016-12-26 NOTE — Telephone Encounter (Signed)
SW PT TO CONFIRM 9/7 LAB APPT AT 11 AM PER [place] [facility]. INFUSION 9/14

## 2016-12-26 NOTE — Procedures (Addendum)
see previous note from 9/4

## 2016-12-26 NOTE — Telephone Encounter (Signed)
Telephone call to patient in response to [employer]. Patient states he has had to sleep sitting in his recliner for the past night. Patient given central scheduling number again for calling to schedule ordered thoracentesis. Patient states he forgot this information was given to him Friday. Patient states he has a lot going on today but will call and schedule a time that is convenient to him. Patient encouraged to call and get in right away. Patient states he will try.

## 2016-12-26 NOTE — Telephone Encounter (Signed)
Patient called central scheduling to have thoracentesis scheduled. The only availability is at [facility]- order had to be changed to [facility] protocol IR instead of [place].  Order placed- scheduling notified.

## 2016-12-26 NOTE — Procedures (Signed)
Ultrasound-guided therapeutic left  thoracentesis performed yielding 2.1 liters of hazy, yellow fluid. No immediate complications. Follow-up chest x-ray pending.

## 2016-12-26 NOTE — Telephone Encounter (Signed)
Pt left voice message on 9/3 when we were closed. When this RN was going to return call, it was noted the issue had been addressed by Jethro Bolus LPN

## 2016-12-27 ENCOUNTER — Telehealth: Payer: Self-pay | Admitting: *Deleted

## 2016-12-27 NOTE — Telephone Encounter (Signed)
Message from pt requesting call from MD to discuss indwelling pleural drainage catheter. He would like to avoid repeat visits to radiology for thoracentesis.

## 2016-12-27 NOTE — Telephone Encounter (Signed)
Per Dr. Benay Spice: Will need to wait until WBC recover. Pt notified. He asked if he can be worked in for chemo if WBC are normal on 9/7. Instructed him to wait for result, will review with him in lobby to see if that is an option. Message to lab to perform fingerstick CBC on 9/7. Will keep flush appt in case treatment can be worked in.

## 2016-12-29 ENCOUNTER — Telehealth: Payer: Self-pay | Admitting: *Deleted

## 2016-12-29 ENCOUNTER — Telehealth: Payer: Self-pay | Admitting: Oncology

## 2016-12-29 ENCOUNTER — Other Ambulatory Visit (HOSPITAL_BASED_OUTPATIENT_CLINIC_OR_DEPARTMENT_OTHER): Payer: Medicare Other

## 2016-12-29 DIAGNOSIS — C155 Malignant neoplasm of lower third of esophagus: Secondary | ICD-10-CM | POA: Diagnosis present

## 2016-12-29 LAB — CBC WITH DIFFERENTIAL/PLATELET
BASO%: 3.4 % — ABNORMAL HIGH (ref 0.0–2.0)
BASOS ABS: 0 10*3/uL (ref 0.0–0.1)
EOS ABS: 0.1 10*3/uL (ref 0.0–0.5)
EOS%: 4.3 % (ref 0.0–7.0)
HCT: 38.7 % (ref 38.4–49.9)
HEMOGLOBIN: 12.4 g/dL — AB (ref 13.0–17.1)
LYMPH#: 0.5 10*3/uL — AB (ref 0.9–3.3)
LYMPH%: 44.4 % (ref 14.0–49.0)
MCH: 33.5 pg — ABNORMAL HIGH (ref 27.2–33.4)
MCHC: 32 g/dL (ref 32.0–36.0)
MCV: 104.6 fL — AB (ref 79.3–98.0)
MONO#: 0.2 10*3/uL (ref 0.1–0.9)
MONO%: 20.5 % — ABNORMAL HIGH (ref 0.0–14.0)
NEUT#: 0.3 10*3/uL — CL (ref 1.5–6.5)
NEUT%: 27.4 % — ABNORMAL LOW (ref 39.0–75.0)
NRBC: 0 % (ref 0–0)
PLATELETS: 135 10*3/uL — AB (ref 140–400)
RBC: 3.7 10*6/uL — ABNORMAL LOW (ref 4.20–5.82)
RDW: 14.3 % (ref 11.0–14.6)
WBC: 1.2 10*3/uL — ABNORMAL LOW (ref 4.0–10.3)

## 2016-12-29 NOTE — Telephone Encounter (Signed)
lvm to inform pt of lab appt 9/11 at 1130 am per sch msg

## 2016-12-29 NOTE — Telephone Encounter (Signed)
CBC reviewed by Dr. Benay Spice: ANC 0.3, WBC improving at 1.2. Still too low for treatment or PleurX insertion. Repeat next week. Spoke with pt in lobby, he denies any signs of infection. Neutropenic precautions reviewed. Pt instructed to call office for fever or shaking chills. He plans to go out of town on 9/14 to see his grandsons play ball. Requesting to reschedule chemo for earlier next week. Pt also reports increasing dyspnea. Requesting thoracentesis next week. He would like to have PleurX catheters inserted when he gets back into town week of 9/17.

## 2017-01-01 ENCOUNTER — Ambulatory Visit (HOSPITAL_COMMUNITY)
Admission: RE | Admit: 2017-01-01 | Discharge: 2017-01-01 | Disposition: A | Discharge status: Home / Self Care | Payer: Medicare Other | Source: Ambulatory Visit | Attending: Oncology | Admitting: Oncology

## 2017-01-01 ENCOUNTER — Other Ambulatory Visit: Payer: Self-pay

## 2017-01-01 ENCOUNTER — Telehealth: Payer: Self-pay

## 2017-01-01 ENCOUNTER — Encounter (HOSPITAL_COMMUNITY): Payer: Self-pay | Admitting: Student

## 2017-01-01 ENCOUNTER — Ambulatory Visit (HOSPITAL_BASED_OUTPATIENT_CLINIC_OR_DEPARTMENT_OTHER): Payer: Medicare Other | Admitting: Oncology

## 2017-01-01 ENCOUNTER — Encounter: Payer: Self-pay | Admitting: Nurse Practitioner

## 2017-01-01 ENCOUNTER — Ambulatory Visit (HOSPITAL_COMMUNITY)
Admission: RE | Admit: 2017-01-01 | Discharge: 2017-01-01 | Disposition: A | Discharge status: Home / Self Care | Payer: Medicare Other | Source: Ambulatory Visit | Attending: Student | Admitting: Student

## 2017-01-01 ENCOUNTER — Ambulatory Visit (HOSPITAL_BASED_OUTPATIENT_CLINIC_OR_DEPARTMENT_OTHER): Payer: Medicare Other

## 2017-01-01 ENCOUNTER — Other Ambulatory Visit: Payer: Self-pay | Admitting: *Deleted

## 2017-01-01 ENCOUNTER — Encounter: Payer: Self-pay | Admitting: Oncology

## 2017-01-01 VITALS — BP 158/67 | HR 63 | Temp 97.8°F | Resp 17 | Ht 75.0 in | Wt 185.0 lb

## 2017-01-01 DIAGNOSIS — J939 Pneumothorax, unspecified: Secondary | ICD-10-CM | POA: Diagnosis not present

## 2017-01-01 DIAGNOSIS — J90 Pleural effusion, not elsewhere classified: Secondary | ICD-10-CM | POA: Diagnosis not present

## 2017-01-01 DIAGNOSIS — C155 Malignant neoplasm of lower third of esophagus: Secondary | ICD-10-CM | POA: Insufficient documentation

## 2017-01-01 DIAGNOSIS — Z9889 Other specified postprocedural states: Secondary | ICD-10-CM

## 2017-01-01 DIAGNOSIS — R0602 Shortness of breath: Secondary | ICD-10-CM | POA: Diagnosis not present

## 2017-01-01 DIAGNOSIS — I10 Essential (primary) hypertension: Secondary | ICD-10-CM | POA: Diagnosis not present

## 2017-01-01 HISTORY — PX: IR THORACENTESIS ASP PLEURAL SPACE W/IMG GUIDE: IMG5380

## 2017-01-01 LAB — CBC WITH DIFFERENTIAL/PLATELET
BASO%: 3.7 % — ABNORMAL HIGH (ref 0.0–2.0)
Basophils Absolute: 0.1 10*3/uL (ref 0.0–0.1)
EOS%: 1.9 % (ref 0.0–7.0)
Eosinophils Absolute: 0 10*3/uL (ref 0.0–0.5)
HCT: 39.9 % (ref 38.4–49.9)
HGB: 13 g/dL (ref 13.0–17.1)
LYMPH%: 30.2 % (ref 14.0–49.0)
MCH: 34.2 pg — ABNORMAL HIGH (ref 27.2–33.4)
MCHC: 32.6 g/dL (ref 32.0–36.0)
MCV: 105 fL — AB (ref 79.3–98.0)
MONO#: 0.5 10*3/uL (ref 0.1–0.9)
MONO%: 29 % — AB (ref 0.0–14.0)
NEUT%: 35.2 % — ABNORMAL LOW (ref 39.0–75.0)
NEUTROS ABS: 0.6 10*3/uL — AB (ref 1.5–6.5)
NRBC: 0 % (ref 0–0)
Platelets: ADEQUATE 10*3/uL (ref 140–400)
RBC: 3.8 10*6/uL — ABNORMAL LOW (ref 4.20–5.82)
RDW: 14.5 % (ref 11.0–14.6)
WBC: 1.6 10*3/uL — AB (ref 4.0–10.3)
lymph#: 0.5 10*3/uL — ABNORMAL LOW (ref 0.9–3.3)

## 2017-01-01 MED ORDER — LIDOCAINE HCL (PF) 1 % IJ SOLN
INTRAMUSCULAR | Status: AC
  Filled 2017-01-01: qty 30

## 2017-01-01 NOTE — Procedures (Signed)
PROCEDURE SUMMARY:  Successful US guided right therapeutic thoracentesis. Yielded 2.2 liters of yellow fluid. Pt tolerated procedure well. No immediate complications.  Specimen was not sent for labs. CXR ordered.  Docia Barrier PA-C 01/01/2017 4:00 PM

## 2017-01-01 NOTE — Progress Notes (Signed)
[facility] OFFICE PROGRESS NOTE   Diagnosis: Esophagus cancer  INTERVAL HISTORY:   David [name] returns prior to a scheduled visit. He complains of exertional dyspnea. He last underwent a left thoracentesis 12/26/2016. He feels better after the thoracentesis procedures. Good appetite.  He has developed swelling at the lateral chest wall bilaterally.  Mr. [name] has been treated with 2 weeks of Taxol chemotherapy. Chemotherapy was held last week secondary to neutropenia. No fever.  Objective:  Vital signs in last 24 hours:  Blood pressure (!) 158/67, pulse 63, temperature 97.8 F (36.6 C), temperature source Oral, resp. rate 17, height '6\' 3"'  (1.905 m), weight 185 lb (83.9 kg), SpO2 96 %.    HEENT: No thrush or ulcers Resp: Diminished breath sounds at the lower chest bilaterally, inspiratory rub at the left upper anterior chest, no respiratory distress Cardio: Regular rate and rhythm GI: No hepatosplenomegaly Vascular: Trace pitting edema at the left greater than right foot and ankle. There is pitting edema at the low chest wall bilaterally   Portacath/PICC-without erythema  Lab Results:  Lab Results  Component Value Date   WBC 1.2 (L) 12/29/2016   HGB 12.4 (L) 12/29/2016   HCT 38.7 12/29/2016   MCV 104.6 (H) 12/29/2016   PLT 135 (L) 12/29/2016   NEUTROABS 0.3 (LL) 12/29/2016    CMP     Component Value Date/Time   NA 144 12/26/2016 1247   K 4.3 12/26/2016 1247   CL 102 09/06/2016 0947   CO2 31 (H) 12/26/2016 1247   GLUCOSE 95 12/26/2016 1247   BUN 27.6 (H) 12/26/2016 1247   CREATININE 0.9 12/26/2016 1247   CALCIUM 8.7 12/26/2016 1247   PROT 5.7 (L) 12/26/2016 1247   ALBUMIN 2.3 (L) 12/26/2016 1247   AST 24 12/26/2016 1247   ALT 17 12/26/2016 1247   ALKPHOS 86 12/26/2016 1247   BILITOT 0.52 12/26/2016 1247   GFRNONAA >60 09/06/2016 0947   GFRAA >60 09/06/2016 0947    Lab Results  Component Value Date   CEA1 3.73 09/06/2016       Imaging:  Dg Chest 2 View  Result Date: 01/01/2017 CLINICAL DATA:  Shortness of breath all weekend, history of BILATERAL pleural effusions, distal esophageal cancer, hypertension EXAM: CHEST  2 VIEW COMPARISON:  12/26/2016 FINDINGS: RIGHT jugular Port-A-Cath stable with tip projecting over SVC. Normal heart size, mediastinal contours, and pulmonary vascularity. Increasing BILATERAL pleural effusion since previous exam with associated bibasilar atelectasis. Upper lungs clear. LEFT apex pneumothorax again identified are prominent at apex than on previous study but likely related to movement of the previously seen LEFT basilar pneumothorax component to the cranial aspect of the LEFT hemithorax. No RIGHT pneumothorax identified. IMPRESSION: Increased bibasilar effusions with persistent LEFT pneumothorax. Electronically Signed   By: Lavonia Dana M.D.   On: 01/01/2017 11:11    Medications: I have reviewed the patient's current medications.  Assessment/Plan: 1. Adenocarcinoma of the distal esophagus,uT3uN2  Presenting with a food impaction and dysphagia 08/20/2014  Staging CT scans of the chest, abdomen, and pelvis 10/06/2014 with indeterminate pulmonary nodules, a 9 mm paraesophageal lymph node, and circumferential thickening of the distal esophagus  PET scan 10/23/2014 confirmed hypermetabolic thickening at the distal esophagus with small paraesophageal nodes having faint nonspecific activity. A pleural-based focus of consolidation at the posterior right lower lobe had low metabolic activity-favored to be a benign process.  Initiation of concurrent weekly Taxol/carboplatin and radiation on 11/02/2014, radiation completed 12/09/2014  CTs 12/31/2014 revealed distal esophageal wall  thickening with adjacent small lymph nodes suspicious for metastases, stable lung nodule posterior to the right bronchus intermedius and the persistent 5 mm nodule in the right lower lobe  Esophagectomy 02/01/2015  with the pathology confirming a ypT3,ypN3 tumor, positive distal margin  MSI-stable, tumor mutation burden-5  CT chest 07/17/2016-bilateral pleural effusions, Bibasilar airspace opacities with peribronchial vascular nodularity, new low-density right liver lesion  Left thoracentesis 07/19/2016-cytology showedreactive mesothelial cells; mixed lymphoid population  Ultrasound-guided biopsy of a segment 8 liver lesion on 08/01/2016-adenocarcinoma  Cycle 1 FOLFOX 09/06/2016  Cycle 2 FOLFOX 09/21/2016  Cycle 3 FOLFOX 10/05/2016  Cycle 4 FOLFOX 11/02/2016  Cycle 5 FOLFOX 11/20/2016  CT chest 12/01/2016-increased size of right liver lesion, persistent bilateral pleural effusions  Taxol and ramcirumab 12/12/2016; day 8 Taxol 12/19/2016   2. Prostatic hypertrophy-Status post TUR 01/26/2016  3. Hypertension  4. History of multiple basal cell skin cancers  5. History of thrombocytopenia secondary to chemotherapy  6. PVCs/bicuspid aortic valve-followed by cardiology  7. Port-A-Cath placement 09/06/2016  8. Neutropenia secondary to chemotherapy  9. Bilateral pleural effusions, status post right thoracentesis 11/10/2016, left thoracentesis 11/15/2016  Cytology from 11/15/2016-negative  Cytology from 12/04/2016, 12/06/2016, and 12/19/2016-negative    Disposition:  Mr. [name] has metastatic esophagus cancer. He began treatment with Taxol/ ramucirumab last month. He developed severe neutropenia after week #2. Chemotherapy was held last week. He is scheduled to return for an office visit and cycle 2 chemotherapy on 01/09/2017.  We will check a CBC to follow-up on the neutrophil count today.  Mr. [name] has bilateral symptomatic pleural effusions. These are likely malignant effusions, though repeat cytologies have been negative. I discussed the case with Dr. Servando Snare. He will place a left Pleurx on 01/03/2017. Mr. [name] will have a therapeutic right thoracentesis  today.  Mr. [name] would like to travel to [place] this weekend to see his grandson play a football game. He will plan to drive to [place] with his son if he feels well post-Pleurx placement.  The pitting edema at the low lateral chest wall appears to be anasarca related to hypoalbuminemia.  25 minutes were spent with the patient today. The majority of the time was used for counseling and coordination of care.  Donneta Romberg, MD  01/01/2017  2:15 PM

## 2017-01-01 NOTE — Telephone Encounter (Signed)
Call placed to pt regarding his S/S of difficulty breathing. Informed patient per MD Benay Spice, that MD Benay Spice will work him in this morning when he can come in. Pt agreeable to chest xray first at [facility]. Pt appreciative of call back and agrees with plan of care. Pt verbalizes understanding to come to MD Sherrill's office after getting a chest xray.

## 2017-01-02 ENCOUNTER — Ambulatory Visit: Payer: Medicare Other | Admitting: Oncology

## 2017-01-02 ENCOUNTER — Encounter (HOSPITAL_COMMUNITY): Payer: Self-pay | Admitting: *Deleted

## 2017-01-02 ENCOUNTER — Telehealth: Payer: Self-pay | Admitting: Emergency Medicine

## 2017-01-02 ENCOUNTER — Other Ambulatory Visit: Payer: Medicare Other

## 2017-01-02 NOTE — Progress Notes (Signed)
Anesthesia Chart Review:  Pt is a same day work up.   Pt is a 76 year old male scheduled for insertion of L pleural drainage catheter on 01/03/2017 with Lanelle Bal, MD  - PCP is Marton Redwood, MD - EP cardiologist is Virl Axe, MD - Oncologist is Betsy Coder, MD  PMH includes:  PVCs, HTN, esophageal cancer, GERD. Former smoker. BMI 23. S/p TURP 01/26/16. S/p total esophagectomy, pyloromyotomy, jejunostomy 02/01/15.   Medications include: cyproheptadine, flecainide, metoprolol, protonix  Labs will be obtained DOS.  - Neutropenia  (WBC 1.6, ANC 0.6) 01/01/17.   1 view CXR 01/01/17:  1. Significant reduction in size of the right pleural effusion. No right pneumothorax is identified. 2. Stable large left pleural effusion with passive atelectasis. No change in the appearance of the small LEFT apical pneumothorax. 3.  Aortic Atherosclerosis   EKG 06/22/16: Sinus rhythm with frequent PVCs. Possible LA enlargement  Exercise tolerance test 07/07/16:   Blood pressure demonstrated a hypertensive response to exercise.  There was no ST segment deviation noted during stress. 1. Hypertensive BP response.  2. Submaximal stress study with no evidence for ischemia at the achieved workload.  3. No arrhythmias (flecainide evaluation).   Echo 06/14/16:  - Left ventricle: The cavity size was normal. Wall thickness was increased in a pattern of mild LVH. Systolic function was normal. The estimated ejection fraction was in the range of 60% to 65%. Wall motion was normal; there were no regional wall motion abnormalities. Doppler parameters are consistent with abnormal left ventricular relaxation (grade 1 diastolic dysfunction). Doppler parameters are consistent with indeterminate ventricular filling pressure. - Aortic valve: Functionally bicuspid with fusion of the right and non-coronary cusps; mildly thickened, mildly calcified leaflets. Transvalvular velocity was within the normal range. There was no  stenosis. There was no regurgitation. - Mitral valve: Moderately calcified annulus. Transvalvular velocity was within the normal range. There was no evidence for stenosis. There was no regurgitation. - Left atrium: The atrium was moderately dilated. - Right atrium: The atrium was moderately dilated. - Tricuspid valve: There was trivial regurgitation. - Pericardium, extracardiac: A small pericardial effusion was identified at the apex, with an appearance consistent with hemopericardium. There was no evidence of hemodynamic compromise. -  There was a left pleural effusion.  If labs acceptable DOS, I anticipate pt can proceed with surgery as scheduled.   Willeen Cass, FNP-BC [facility]/Anesthesiology Phone: [phone] 01/02/2017 2:10 PM

## 2017-01-02 NOTE — Progress Notes (Signed)
Pt denies chest pain. Pt under the care of Dr. Caryl Comes, Cardiology. Pt denies having a cardiac cath. Pt made aware to stop taking vitamins, fish oil, and herbal medications. Do not take any NSAIDs ie: Ibuprofen, Advil, Naproxen (Aleve), Motrin, BC and Goody Powder or any medication containing Aspirin. Pt verbalized understanding of all pre-op instructions. Anesthesia asked to review pt history ( see note).

## 2017-01-02 NOTE — Telephone Encounter (Signed)
Ladell Pier, MD  P Chcc Mo Pod 2        Please call patient  White count is better, but still mildly decreased, call for fever, follow-up as scheduled    Patient verbalized understanding.

## 2017-01-03 ENCOUNTER — Ambulatory Visit (HOSPITAL_COMMUNITY)
Admission: RE | Admit: 2017-01-03 | Discharge: 2017-01-03 | Disposition: A | Discharge status: Home / Self Care | Payer: Medicare Other | Source: Ambulatory Visit | Attending: Cardiothoracic Surgery | Admitting: Cardiothoracic Surgery

## 2017-01-03 ENCOUNTER — Encounter (HOSPITAL_COMMUNITY): Payer: Self-pay | Admitting: Anesthesiology

## 2017-01-03 ENCOUNTER — Ambulatory Visit (HOSPITAL_COMMUNITY): Payer: Medicare Other

## 2017-01-03 ENCOUNTER — Telehealth: Payer: Self-pay | Admitting: Oncology

## 2017-01-03 ENCOUNTER — Other Ambulatory Visit: Payer: Self-pay | Admitting: *Deleted

## 2017-01-03 ENCOUNTER — Encounter (HOSPITAL_COMMUNITY)
Admission: RE | Disposition: A | Discharge status: Home / Self Care | Payer: Self-pay | Source: Ambulatory Visit | Attending: Cardiothoracic Surgery

## 2017-01-03 ENCOUNTER — Ambulatory Visit (HOSPITAL_COMMUNITY): Payer: Medicare Other | Admitting: Emergency Medicine

## 2017-01-03 DIAGNOSIS — Z87891 Personal history of nicotine dependence: Secondary | ICD-10-CM | POA: Insufficient documentation

## 2017-01-03 DIAGNOSIS — T18128A Food in esophagus causing other injury, initial encounter: Secondary | ICD-10-CM | POA: Diagnosis not present

## 2017-01-03 DIAGNOSIS — K2270 Barrett's esophagus without dysplasia: Secondary | ICD-10-CM | POA: Diagnosis not present

## 2017-01-03 DIAGNOSIS — Z79899 Other long term (current) drug therapy: Secondary | ICD-10-CM | POA: Insufficient documentation

## 2017-01-03 DIAGNOSIS — J90 Pleural effusion, not elsewhere classified: Secondary | ICD-10-CM | POA: Insufficient documentation

## 2017-01-03 DIAGNOSIS — F419 Anxiety disorder, unspecified: Secondary | ICD-10-CM | POA: Insufficient documentation

## 2017-01-03 DIAGNOSIS — N401 Enlarged prostate with lower urinary tract symptoms: Secondary | ICD-10-CM | POA: Diagnosis not present

## 2017-01-03 DIAGNOSIS — I700 Atherosclerosis of aorta: Secondary | ICD-10-CM | POA: Diagnosis not present

## 2017-01-03 DIAGNOSIS — K219 Gastro-esophageal reflux disease without esophagitis: Secondary | ICD-10-CM | POA: Insufficient documentation

## 2017-01-03 DIAGNOSIS — C155 Malignant neoplasm of lower third of esophagus: Secondary | ICD-10-CM | POA: Diagnosis not present

## 2017-01-03 DIAGNOSIS — Z85828 Personal history of other malignant neoplasm of skin: Secondary | ICD-10-CM | POA: Diagnosis not present

## 2017-01-03 DIAGNOSIS — Z87442 Personal history of urinary calculi: Secondary | ICD-10-CM | POA: Diagnosis not present

## 2017-01-03 DIAGNOSIS — I10 Essential (primary) hypertension: Secondary | ICD-10-CM | POA: Diagnosis not present

## 2017-01-03 DIAGNOSIS — Z09 Encounter for follow-up examination after completed treatment for conditions other than malignant neoplasm: Secondary | ICD-10-CM

## 2017-01-03 DIAGNOSIS — J939 Pneumothorax, unspecified: Secondary | ICD-10-CM | POA: Diagnosis not present

## 2017-01-03 DIAGNOSIS — N400 Enlarged prostate without lower urinary tract symptoms: Secondary | ICD-10-CM | POA: Insufficient documentation

## 2017-01-03 HISTORY — DX: Pleural effusion, not elsewhere classified: J90

## 2017-01-03 HISTORY — DX: Liver disease, unspecified: K76.9

## 2017-01-03 HISTORY — DX: Cardiac murmur, unspecified: R01.1

## 2017-01-03 HISTORY — DX: Dyspnea, unspecified: R06.00

## 2017-01-03 HISTORY — PX: CHEST TUBE INSERTION: SHX231

## 2017-01-03 LAB — CBC
HCT: 39.1 % (ref 39.0–52.0)
Hemoglobin: 12.2 g/dL — ABNORMAL LOW (ref 13.0–17.0)
MCH: 33.2 pg (ref 26.0–34.0)
MCHC: 31.2 g/dL (ref 30.0–36.0)
MCV: 106.3 fL — ABNORMAL HIGH (ref 78.0–100.0)
Platelets: 200 10*3/uL (ref 150–400)
RBC: 3.68 MIL/uL — ABNORMAL LOW (ref 4.22–5.81)
RDW: 14.3 % (ref 11.5–15.5)
WBC: 1.6 10*3/uL — ABNORMAL LOW (ref 4.0–10.5)

## 2017-01-03 LAB — COMPREHENSIVE METABOLIC PANEL
ALT: 21 U/L (ref 17–63)
AST: 30 U/L (ref 15–41)
Albumin: 2.4 g/dL — ABNORMAL LOW (ref 3.5–5.0)
Alkaline Phosphatase: 80 U/L (ref 38–126)
Anion gap: 3 — ABNORMAL LOW (ref 5–15)
BUN: 27 mg/dL — ABNORMAL HIGH (ref 6–20)
CO2: 30 mmol/L (ref 22–32)
Calcium: 8.1 mg/dL — ABNORMAL LOW (ref 8.9–10.3)
Chloride: 107 mmol/L (ref 101–111)
Creatinine, Ser: 0.96 mg/dL (ref 0.61–1.24)
GFR calc Af Amer: >60 mL/min (ref >60)
GFR calc non Af Amer: >60 mL/min (ref >60)
Glucose, Bld: 91 mg/dL (ref 65–99)
Potassium: 4.5 mmol/L (ref 3.5–5.1)
Sodium: 140 mmol/L (ref 135–145)
Total Bilirubin: 0.5 mg/dL (ref 0.3–1.2)
Total Protein: 5.4 g/dL — ABNORMAL LOW (ref 6.5–8.1)

## 2017-01-03 LAB — SURGICAL PCR SCREEN
MRSA, PCR: NEGATIVE
Staphylococcus aureus: NEGATIVE

## 2017-01-03 LAB — APTT: aPTT: 31 seconds (ref 24–36)

## 2017-01-03 LAB — PROTIME-INR
INR: 0.97
Prothrombin Time: 12.8 seconds (ref 11.4–15.2)

## 2017-01-03 SURGERY — INSERTION, PLEURAL DRAINAGE CATHETER
Anesthesia: Monitor Anesthesia Care | Laterality: Left

## 2017-01-03 MED ORDER — ONDANSETRON HCL 4 MG/2ML IJ SOLN
INTRAMUSCULAR | Status: AC
  Filled 2017-01-03: qty 2

## 2017-01-03 MED ORDER — PHENYLEPHRINE 40 MCG/ML (10ML) SYRINGE FOR IV PUSH (FOR BLOOD PRESSURE SUPPORT)
PREFILLED_SYRINGE | INTRAVENOUS | Status: AC
Start: 2017-01-03 — End: ?
  Filled 2017-01-03: qty 10

## 2017-01-03 MED ORDER — PROPOFOL 10 MG/ML IV BOLUS
INTRAVENOUS | Status: AC
  Filled 2017-01-03: qty 20

## 2017-01-03 MED ORDER — PROMETHAZINE HCL 25 MG/ML IJ SOLN: 6.2500 mg | Freq: Once | INTRAMUSCULAR | Status: DC

## 2017-01-03 MED ORDER — FENTANYL CITRATE (PF) 250 MCG/5ML IJ SOLN
INTRAMUSCULAR | Status: AC
  Filled 2017-01-03: qty 5

## 2017-01-03 MED ORDER — MIDAZOLAM HCL 5 MG/5ML IJ SOLN
INTRAMUSCULAR | Status: DC | PRN
  Administered 2017-01-03: 0.5 mg via INTRAVENOUS

## 2017-01-03 MED ORDER — MUPIROCIN 2 % EX OINT
TOPICAL_OINTMENT | CUTANEOUS | Status: AC
  Administered 2017-01-03: 1 via TOPICAL
  Filled 2017-01-03: qty 22

## 2017-01-03 MED ORDER — LIDOCAINE 2% (20 MG/ML) 5 ML SYRINGE
INTRAMUSCULAR | Status: DC | PRN
  Administered 2017-01-03: 60 mg via INTRAVENOUS

## 2017-01-03 MED ORDER — MIDAZOLAM HCL 2 MG/2ML IJ SOLN
INTRAMUSCULAR | Status: AC
  Filled 2017-01-03: qty 2

## 2017-01-03 MED ORDER — ONDANSETRON HCL 4 MG/2ML IJ SOLN
INTRAMUSCULAR | Status: DC | PRN
  Administered 2017-01-03: 4 mg via INTRAVENOUS

## 2017-01-03 MED ORDER — PROMETHAZINE HCL 25 MG/ML IJ SOLN
INTRAMUSCULAR | Status: AC
  Filled 2017-01-03: qty 1

## 2017-01-03 MED ORDER — LACTATED RINGERS IV SOLN
INTRAVENOUS | Status: DC | PRN
  Administered 2017-01-03: 08:00:00 via INTRAVENOUS

## 2017-01-03 MED ORDER — LIDOCAINE 2% (20 MG/ML) 5 ML SYRINGE
INTRAMUSCULAR | Status: AC
Start: 2017-01-03 — End: ?
  Filled 2017-01-03: qty 5

## 2017-01-03 MED ORDER — PROPOFOL 10 MG/ML IV BOLUS
INTRAVENOUS | Status: DC | PRN
  Administered 2017-01-03: 30 mg via INTRAVENOUS

## 2017-01-03 MED ORDER — FENTANYL CITRATE (PF) 100 MCG/2ML IJ SOLN
INTRAMUSCULAR | Status: DC | PRN
  Administered 2017-01-03 (×3): 50 ug via INTRAVENOUS

## 2017-01-03 MED ORDER — 0.9 % SODIUM CHLORIDE (POUR BTL) OPTIME
TOPICAL | Status: DC | PRN
  Administered 2017-01-03: 1000 mL

## 2017-01-03 MED ORDER — FENTANYL CITRATE (PF) 100 MCG/2ML IJ SOLN: 25.0000 ug | INTRAMUSCULAR | Status: DC | PRN

## 2017-01-03 MED ORDER — MUPIROCIN 2 % EX OINT
1.0000 "application " | TOPICAL_OINTMENT | Freq: Once | CUTANEOUS | Status: AC
  Administered 2017-01-03: 1 via TOPICAL

## 2017-01-03 MED ORDER — DEXTROSE 5 % IV SOLN
1.5000 g | INTRAVENOUS | Status: AC
  Administered 2017-01-03: 1.5 g via INTRAVENOUS
  Filled 2017-01-03: qty 1.5

## 2017-01-03 MED ORDER — LIDOCAINE HCL (PF) 1 % IJ SOLN
INTRAMUSCULAR | Status: DC | PRN
  Administered 2017-01-03: 15 mL via SUBCUTANEOUS

## 2017-01-03 MED ORDER — PROMETHAZINE HCL 25 MG/ML IJ SOLN
6.2500 mg | Freq: Once | INTRAMUSCULAR | Status: DC
  Administered 2017-01-03: 6.25 mg via INTRAVENOUS

## 2017-01-03 SURGICAL SUPPLY — 28 items
ADH SKN CLS APL DERMABOND .7 (GAUZE/BANDAGES/DRESSINGS) ×1
BRUSH SCRUB EZ PLAIN DRY (MISCELLANEOUS) ×4 IMPLANT
CANISTER SUCT 3000ML PPV (MISCELLANEOUS) ×2 IMPLANT
COVER PROBE W GEL 5X96 (DRAPES) ×1 IMPLANT
COVER SURGICAL LIGHT HANDLE (MISCELLANEOUS) ×2 IMPLANT
COVER TRANSDUCER ULTRASND GEL (DRAPE) ×2 IMPLANT
DERMABOND ADVANCED (GAUZE/BANDAGES/DRESSINGS) ×1
DERMABOND ADVANCED .7 DNX12 (GAUZE/BANDAGES/DRESSINGS) ×1 IMPLANT
DRAPE C-ARM 42X72 X-RAY (DRAPES) ×2 IMPLANT
DRAPE LAPAROSCOPIC ABDOMINAL (DRAPES) ×2 IMPLANT
GAUZE SPONGE 4X4 12PLY STRL LF (GAUZE/BANDAGES/DRESSINGS) ×1 IMPLANT
GLOVE BIO SURGEON STRL SZ 6.5 (GLOVE) ×4 IMPLANT
GOWN STRL REUS W/ TWL LRG LVL3 (GOWN DISPOSABLE) ×2 IMPLANT
GOWN STRL REUS W/TWL LRG LVL3 (GOWN DISPOSABLE) ×4
KIT BASIN OR (CUSTOM PROCEDURE TRAY) ×2 IMPLANT
KIT PLEURX DRAIN CATH 1000ML (MISCELLANEOUS) ×2 IMPLANT
KIT PLEURX DRAIN CATH 15.5FR (DRAIN) ×2 IMPLANT
KIT ROOM TURNOVER OR (KITS) ×2 IMPLANT
NS IRRIG 1000ML POUR BTL (IV SOLUTION) ×2 IMPLANT
PACK GENERAL/GYN (CUSTOM PROCEDURE TRAY) ×2 IMPLANT
PAD ARMBOARD 7.5X6 YLW CONV (MISCELLANEOUS) ×4 IMPLANT
SET DRAINAGE LINE (MISCELLANEOUS) IMPLANT
SUT ETHILON 3 0 FSL (SUTURE) ×2 IMPLANT
SUT VIC AB 3-0 X1 27 (SUTURE) ×2 IMPLANT
TOWEL OR 17X24 6PK STRL BLUE (TOWEL DISPOSABLE) ×2 IMPLANT
TOWEL OR 17X26 10 PK STRL BLUE (TOWEL DISPOSABLE) ×2 IMPLANT
VALVE REPLACEMENT CAP (MISCELLANEOUS) IMPLANT
WATER STERILE IRR 1000ML POUR (IV SOLUTION) ×2 IMPLANT

## 2017-01-03 NOTE — Anesthesia Postprocedure Evaluation (Signed)
Anesthesia Post Note  Patient: ARMANDO BUKHARI  Procedure(s) Performed: Procedure(s) (LRB): INSERTION PLEURAL DRAINAGE CATHETER (Left)     Patient location during evaluation: PACU Anesthesia Type: MAC Level of consciousness: awake and alert Pain management: pain level controlled Vital Signs Assessment: post-procedure vital signs reviewed and stable Respiratory status: spontaneous breathing, nonlabored ventilation, respiratory function stable and patient connected to nasal cannula oxygen Cardiovascular status: blood pressure returned to baseline and stable Postop Assessment: no signs of nausea or vomiting Anesthetic complications: no    Last Vitals:  Vitals:   01/03/17 1010 01/03/17 1030  BP: (!) 144/69   Pulse: (!) 58 (!) 58  Resp: 16   Temp: 36.6 C   SpO2: 98% 94%    Last Pain:  Vitals:   01/03/17 1040  TempSrc:   PainSc: 0-No pain                 Sway Guttierrez,JAMES TERRILL

## 2017-01-03 NOTE — Consult Note (Addendum)
   [facility] CM Inpatient Consult   01/03/2017  JAQUEL GLASSBURN 1940/12/07 [id number]    [facility] Care Management referral received. Mr. Dalby was in procedural area not admitted to hospital.   Order appeared to be for nurse daily pleurex drainage. This is not a [facility] Care Management function.  Will call patient, since he is already discharged and assess if there are any [facility] Care Management needs.   Marthenia Rolling, MSN-Ed, RN,BSN [facility] Liaison [phone]

## 2017-01-03 NOTE — Transfer of Care (Signed)
Immediate Anesthesia Transfer of Care Note  Patient: David [name]  Procedure(s) Performed: Procedure(s): INSERTION PLEURAL DRAINAGE CATHETER (Left)  Patient Location: PACU  Anesthesia Type:MAC  Level of Consciousness: awake, alert , oriented and patient cooperative  Airway & Oxygen Therapy: Patient Spontanous Breathing and Patient connected to nasal cannula oxygen  Post-op Assessment: Report given to RN, Post -op Vital signs reviewed and stable and Patient moving all extremities  Post vital signs: Reviewed and stable  Last Vitals:  Vitals:   01/03/17 0659 01/03/17 0915  BP: (!) 180/76   Pulse: 63   Resp: 16 (P) 12  Temp:  (P) 36.4 C  SpO2: 97%     Last Pain:  Vitals:   01/03/17 0659  TempSrc: Oral      Patients Stated Pain Goal: 3 (92/[id number]/44 [id number])  Complications: No apparent anesthesia complications

## 2017-01-03 NOTE — Anesthesia Preprocedure Evaluation (Addendum)
Anesthesia Evaluation  Patient identified by MRN, date of birth, ID band Patient awake    Reviewed: Allergy & Precautions, NPO status , Patient's Chart, lab work & pertinent test results  Airway Mallampati: II  TM Distance: <3 FB Neck ROM: Full    Dental no notable dental hx.    Pulmonary shortness of breath, former smoker,     + decreased breath sounds      Cardiovascular hypertension, + dysrhythmias  Rhythm:Regular Rate:Normal     Neuro/Psych    GI/Hepatic GERD  ,Ca of esophagus   Endo/Other    Renal/GU      Musculoskeletal  (+) Arthritis ,   Abdominal   Peds  Hematology   Anesthesia Other Findings   Reproductive/Obstetrics                            Anesthesia Physical Anesthesia Plan  ASA: III  Anesthesia Plan: MAC   Post-op Pain Management:    Induction: Intravenous  PONV Risk Score and Plan: 2 and Ondansetron and Dexamethasone  Airway Management Planned: Natural Airway and Simple Face Mask  Additional Equipment:   Intra-op Plan:   Post-operative Plan:   Informed Consent: I have reviewed the patients History and Physical, chart, labs and discussed the procedure including the risks, benefits and alternatives for the proposed anesthesia with the patient or authorized representative who has indicated his/her understanding and acceptance.     Plan Discussed with: CRNA  Anesthesia Plan Comments:         Anesthesia Quick Evaluation

## 2017-01-03 NOTE — Patient Outreach (Signed)
Received an inpatient [facility] referral while patient was in  the ambulatory setting.   Chart reviewed. David [name] is not currently in the hospital nor was he admitted to the hospital.  Attempted to contact David [name] at [phone] to find out whether he needed [facility] services.   Left confidential voicemail message to request call back.   Will make [facility] office aware.    Marthenia Rolling, MSN-Ed, RN,BSN [facility] Liaison [phone]

## 2017-01-03 NOTE — Telephone Encounter (Signed)
Per 9/10 los- all needed appts had already been scheduled.

## 2017-01-03 NOTE — Brief Op Note (Signed)
      [address]       [place] [address]             [phone]      01/03/2017  9:07 AM  PATIENT:  David [name]  76 y.o. male  PRE-OPERATIVE DIAGNOSIS:  left pleural effusion  POST-OPERATIVE DIAGNOSIS:  left pleural effusion  PROCEDURE:  Procedure(s): INSERTION PLEURAL DRAINAGE CATHETER (Left) With ultrasound and fluro  SURGEON:  Surgeon(s) and Role:    Grace Isaac, MD - Primary   ANESTHESIA:   MAC  EBL:  No intake/output data recorded.  BLOOD ADMINISTERED:none  DRAINS: left pleurix   LOCAL MEDICATIONS USED:  LIDOCAINE   SPECIMEN:  Source of Specimen:  left pleural fluid   DISPOSITION OF SPECIMEN:  PATHOLOGY  COUNTS:  YES   DICTATION: .Dragon Dictation  PLAN OF CARE: Discharge to home after PACU  PATIENT DISPOSITION:  PACU - hemodynamically stable.   Delay start of Pharmacological VTE agent (>24hrs) due to surgical blood loss or risk of bleeding: yes

## 2017-01-03 NOTE — H&P (Signed)
[address]       [place] [address]             [phone]                    David [name] [facility] Medical Record #[id number] Date of Birth: 12/11/1940  Referring: Dr David [name]  Primary Care: David Redwood, MD  Chief Complaint:   No chief complaint on file.   History of Present Illness:    David [name] 76 y.o. male is seen in the hospital   today for pleural effusions . He has had multiple thoracentesis for effuison. Monday , 2.2 liters were removed from right. Cytology has been negative so far .       Current Activity/ Functional Status:  Patient is independent with mobility/ambulation, transfers, ADL's, IADL's.   Zubrod Score: At the time of surgery this patient's most appropriate activity status/level should be described as: []     0    Normal activity, no symptoms [x]     1    Restricted in physical strenuous activity but ambulatory, able to do out light work []     2    Ambulatory and capable of self care, unable to do work activities, up and about               >50 % of waking hours                              []     3    Only limited self care, in bed greater than 50% of waking hours []     4    Completely disabled, no self care, confined to bed or chair []     5    Moribund   Past Medical History:  Diagnosis Date  . Allergy   . Anxiety   . Arthritis   . [name]'s esophagus   . Basal cell carcinoma    nose, face, back  . Dyspnea   . Dysrhythmia   . Enlarged prostate   . Esophageal cancer ([place])   . Food impaction of esophagus 08/20/2014  . GERD (gastroesophageal reflux disease)    needs 30* elevation since total esophagectomy -uses wedge at home  . Heart murmur   . Hypertension   . Kidney stone   . Liver lesion   . Pleural effusion on left   . Radiation   . Urinary frequency   . Wears glasses     Past Surgical History:  Procedure Laterality Date  . APPENDECTOMY  1961  . CERVICAL LAMINECTOMY  1980  . COLONOSCOPY     x2  .  COMPLETE ESOPHAGECTOMY [name] 02/01/2015   Procedure: TRANSHIATAL TOTAL ESOPHAGECTOMY  ;  Surgeon: David Isaac, MD;  Location: [facility];  Service: Thoracic;  Laterality: [name];  . CYSTOSCOPY WITH STENT PLACEMENT Right 01/26/2016   Procedure: CYSTOSCOPY WITH STENT PLACEMENT;  Surgeon: Franchot Gallo, MD;  Location: [facility];  Service: Urology;  Laterality: Right;  . CYSTOSCOPY/RETROGRADE/URETEROSCOPY/STONE EXTRACTION WITH BASKET Right 01/26/2016   Procedure: CYSTOSCOPY/RETROGRADE/URETEROSCOPY/STONE EXTRACTION WITH BASKET;  Surgeon: Franchot Gallo, MD;  Location: [facility];  Service: Urology;  Laterality: Right;  . ESOPHAGOGASTRODUODENOSCOPY [name] 08/20/2014   Procedure: ESOPHAGOGASTRODUODENOSCOPY (EGD);  Surgeon: Gatha Mayer, MD;  Location: [facility] ENDOSCOPY;  Service: Endoscopy;  Laterality: [name];  . ESOPHAGOGASTROSTOMY  02/01/2015   Procedure: CERVICAL ESOPHAGOGASTROSTOMY;  Surgeon: David Isaac, MD;  Location: [facility];  Service: Thoracic;;  . EUS [name] 10/15/2014   Procedure: UPPER ENDOSCOPIC ULTRASOUND (EUS) LINEAR;  Surgeon: Milus Banister, MD;  Location: [facility] ENDOSCOPY;  Service: Endoscopy;  Laterality: [name];  radial linear  . HOLMIUM LASER APPLICATION Right 79/0/2409   Procedure: HOLMIUM LASER APPLICATION;  Surgeon: Franchot Gallo, MD;  Location: [facility];  Service: Urology;  Laterality: Right;  . IR FLUORO GUIDE PORT INSERTION RIGHT  09/06/2016  . IR THORACENTESIS ASP PLEURAL SPACE W/IMG GUIDE  12/19/2016  . IR THORACENTESIS ASP PLEURAL SPACE W/IMG GUIDE  01/01/2017  . IR US GUIDE VASC ACCESS RIGHT  09/06/2016  . JEJUNOSTOMY  02/01/2015   Procedure: JEJUNOSTOMY;  Surgeon: David Isaac, MD;  Location: [place];  Service: Thoracic;;  . Edwena Blow  02/01/2015   Procedure: Edwena Blow;  Surgeon: David Isaac, MD;  Location: [facility];  Service: Thoracic;;  . TOTAL KNEE ARTHROPLASTY  2010   right  . TRANSURETHRAL RESECTION OF PROSTATE  [name] 01/26/2016   Procedure: TRANSURETHRAL RESECTION OF THE PROSTATE (TURP);  Surgeon: Franchot Gallo, MD;  Location: [facility];  Service: Urology;  Laterality: [name];    Family History  Problem Relation Age of Onset  . Lymphoma Mother   . Cancer Maternal Uncle   . Cancer Paternal Uncle   . Cancer Paternal Grandmother   . Stomach cancer Neg Hx   . Colon cancer Neg Hx     Social History   Social History  . Marital status: Married    Spouse name: [name]  . Number of children: [name]  . Years of education: [name]   Occupational History  . Not on file.   Social History Main Topics  . Smoking status: Former Smoker    Quit date: 06/07/1961  . Smokeless tobacco: Never Used  . Alcohol use No  . Drug use: No  . Sexual activity: Not Currently   Other Topics Concern  . Not on file   Social History Narrative  . No narrative on file    History  Smoking Status  . Former Smoker  . Quit date: 06/07/1961  Smokeless Tobacco  . Never Used    History  Alcohol Use No     Allergies  Allergen Reactions  . Dilaudid [Hydromorphone Hcl] Hives and Itching  . Fentanyl Itching  . Nexium [Esomeprazole Magnesium] Other (See Comments)    Difficulty urinating and passing stool; dry mouth  . Other     Thinks it was oxycodone; made him hallucinate  . Oxycodone Nausea And Vomiting  . Statins Other (See Comments)    Leg cramping    Current Facility-Administered Medications  Medication Dose Route Frequency Provider Last Rate Last Dose  . cefUROXime (ZINACEF) 1.5 g in dextrose 5 % 50 mL IVPB  1.5 g Intravenous 60 min Pre-Op David Isaac, MD       Facility-Administered Medications Ordered in Other Encounters  Medication Dose Route Frequency Provider Last Rate Last Dose  . sodium chloride flush (NS) 0.9 % injection 10 mL  10 mL Intravenous PRN David Pier, MD   10 mL at 10/05/16 0933    Pertinent items are noted in HPI.   Review of Systems:     Cardiac Review of  Systems: Y or N  Chest Pain [ n ]  Resting SOB [ n  ] Exertional SOB  [ y ]  Orthopnea [  ]   Pedal Edema [   ]  Palpitations [  ] Syncope  [  ]   Presyncope [   ]  General Review of Systems: [Y] = yes [  ]=no Constitional: recent weight change Blue.Reese  ];  Wt loss over the last 3 months [   ] anorexia [  ]; fatigue [  ]; nausea [  ]; night sweats [  ]; fever [  ]; or chills [  ];          Dental: poor dentition[  ]; Last Dentist visit:   Eye : blurred vision [  ]; diplopia [   ]; vision changes [  ];  Amaurosis fugax[  ]; Resp: cough [n  ];  wheezing[n  ];  hemoptysis[ n ]; shortness of breath[ y ]; paroxysmal nocturnal dyspnea[  ]; dyspnea on exertion[  ]; or orthopnea[  ];  GI:  gallstones[  ], vomiting[  ];  dysphagia[  ]; melena[  ];  hematochezia [  ]; heartburn[  ];   Hx of  Colonoscopy[  ]; GU: kidney stones [  ]; hematuria[  ];   dysuria [  ];  nocturia[  ];  history of     obstruction [  ]; urinary frequency [  ]             Skin: rash, swelling[  ];, hair loss[  ];  peripheral edema[  ];  or itching[  ]; Musculosketetal: myalgias[  ];  joint swelling[  ];  joint erythema[  ];  joint pain[  ];  back pain[  ];  Heme/Lymph: bruising[  ];  bleeding[  ];  anemia[  ];  Neuro: TIA[  ];  headaches[  ];  stroke[  ];  vertigo[  ];  seizures[  ];   paresthesias[  ];  difficulty walking[n  ];  Psych:depression[  ]; anxiety[  ];  Endocrine: diabetes[  ];  thyroid dysfunction[  ];  Immunizations: Flu up to date [  ]; Pneumococcal up to date [  ];  Other:  Physical Exam: BP (!) 180/76   Pulse 63   Resp 16   SpO2 97%   PHYSICAL EXAMINATION: General appearance: alert, cooperative and no distress Head: Normocephalic, without obvious abnormality, atraumatic Neck: no adenopathy, no carotid bruit, no JVD, supple, symmetrical, trachea midline and thyroid not enlarged, symmetric, no tenderness/mass/nodules Lymph nodes: Cervical, supraclavicular, and axillary nodes normal. Resp: diminished breath  sounds bibasilar Back: symmetric, no curvature. ROM normal. No CVA tenderness. Cardio: regular rate and rhythm, S1, S2 normal, no murmur, click, rub or gallop GI: soft, non-tender; bowel sounds normal; no masses,  no organomegaly Extremities: extremities normal, atraumatic, no cyanosis or edema Neurologic: Grossly normal Incisions healed well, no cervical or supraclavicular adenopathy   Diagnostic Studies & Laboratory data:     Recent Radiology Findings:   Dg Chest 1 View  Result Date: 01/01/2017 CLINICAL DATA:  Thoracentesis today, 2.2 L removed. EXAM: CHEST 1 VIEW COMPARISON:  01/01/2017 at 10:55 a.m. FINDINGS: Significant reduction in the size of the right pleural effusion, with interval re- are a shin of lung at the right lung base. Small residual amount of blunting at the right costophrenic angle. No right pneumothorax. Large left pleural effusion persists. There is an approximately 10% left apical pneumothorax is well, not changed from the previous exam. Power injectable Port-A-Cath tip: SVC. Atherosclerotic calcification of the aortic arch. Thoracic spondylosis. IMPRESSION: 1. Significant reduction in size of the right pleural effusion. No right pneumothorax is identified. 2. Stable large left  pleural effusion with passive atelectasis. No change in the appearance of the small LEFT apical pneumothorax. 3.  Aortic Atherosclerosis (ICD10-I70.0). Electronically Signed   By: Van Clines M.D.   On: 01/01/2017 17:00   Dg Chest 1 View  Result Date: 12/26/2016 CLINICAL DATA:  Status post left thoracentesis. EXAM: CHEST 1 VIEW COMPARISON:  12/19/2016 FINDINGS: Minimal right base atelectasis noted with tiny right pleural effusion. Interval decrease in left pleural effusion. Small left pneumothorax visible, similar at the left apex to the prior study and more prominent now at the left base after removal of the left pleural fluid. Cardiopericardial silhouette is at upper limits of normal for size.  The visualized bony structures of the thorax are intact. Right Port-A-Cath remains stable. IMPRESSION: 1. Interval decrease in left pleural effusion with residual small left pneumothorax, similar to prior at the left apex. 2. Minimal right base atelectasis with tiny right pleural effusion. Electronically Signed   By: Misty Stanley M.D.   On: 12/26/2016 11:54   Dg Chest 1 View  Result Date: 12/19/2016 CLINICAL DATA:  History of metastatic esophageal carcinoma with recurrent bilateral pleural effusions. Status post right thoracentesis today. Status post left thoracentesis on 12/06/2016. EXAM: CHEST 1 VIEW COMPARISON:  12/06/2016 FINDINGS: The heart size and mediastinal contours are within normal limits. No significant right pleural fluid. No pneumothorax on the right. Diminished size of ex vacuo left pneumothorax with tiny apical component remaining. Recurrence of moderate to large left pleural effusion. The visualized skeletal structures are unremarkable. IMPRESSION: No evidence of right-sided pneumothorax following right thoracentesis today. Decrease in ex vacuo left-sided pneumothorax with reaccumulation of left pleural fluid since the last chest x-ray. Electronically Signed   By: Aletta Edouard M.D.   On: 12/19/2016 17:13   Dg Chest 1 View  Result Date: 12/06/2016 CLINICAL DATA:  Post thoracentesis EXAM: CHEST 1 VIEW COMPARISON:  12/04/2016 FINDINGS: Decreasing left effusion following thoracentesis. There is a small to moderate-sized left pneumothorax, noted over both the base and apex, 10-15% in size. Small bilateral pleural effusions remain. Heart is normal size. Right Port-A-Cath is unchanged. IMPRESSION: Decreasing left effusion following thoracentesis. Small to moderate-sized left pneumothorax, 10-15% noted over the left base and left apex. These results will be called to the ordering clinician or representative by the Radiologist Assistant, and communication documented in the PACS or zVision  Dashboard. Electronically Signed   By: Rolm Baptise M.D.   On: 12/06/2016 15:22   Dg Chest 1 View  Result Date: 12/04/2016 CLINICAL DATA:  Pleural effusions.  Status post right thoracentesis. EXAM: CHEST 1 VIEW COMPARISON:  Chest CT dated 11/30/2016 FINDINGS: There is minimal residual right pleural effusion after thoracentesis. No pneumothorax. There is a moderate left pleural effusion which appears unchanged since the prior CT scan but increased since 11/15/2016. Power port in place.  Heart size and vascularity are normal. IMPRESSION: No pneumothorax after thoracentesis on the right. Minimal residual right pleural fluid. Moderate left effusion, unchanged since the prior CT scan. Electronically Signed   By: Lorriane Shire M.D.   On: 12/04/2016 14:36   Dg Chest 2 View  Result Date: 01/01/2017 CLINICAL DATA:  Shortness of breath all weekend, history of BILATERAL pleural effusions, distal esophageal cancer, hypertension EXAM: CHEST  2 VIEW COMPARISON:  12/26/2016 FINDINGS: RIGHT jugular Port-A-Cath stable with tip projecting over SVC. Normal heart size, mediastinal contours, and pulmonary vascularity. Increasing BILATERAL pleural effusion since previous exam with associated bibasilar atelectasis. Upper lungs clear. LEFT apex pneumothorax again identified are  prominent at apex than on previous study but likely related to movement of the previously seen LEFT basilar pneumothorax component to the cranial aspect of the LEFT hemithorax. No RIGHT pneumothorax identified. IMPRESSION: Increased bibasilar effusions with persistent LEFT pneumothorax. Electronically Signed   By: Lavonia Dana M.D.   On: 01/01/2017 11:11   Ir US Chest  Result Date: 12/21/2016 CLINICAL DATA:  History of esophageal carcinoma, now with recurrent symptomatic bilateral pleural effusions. Patient recently underwent right-sided thoracentesis on 12/19/2016 and presents today for attempted left-sided thoracentesis. Note, patient experienced an ex  vacuo pneumothorax following most recent left-sided thoracentesis on 11/15/2016. EXAM: CHEST ULTRASOUND COMPARISON:  Right-sided thoracentesis - 12/19/2016; 12/04/2016; 11/10/2016; left-sided thoracentesis - 11/15/2016; chest radiograph - 12/19/2016; 12/06/2016; 12/04/2016; 11/15/2016; 11/10/2016 FINDINGS: Sonographic evaluation of the left hemithorax demonstrates development of a recurrent moderate to large sized anechoic pleural effusion. Risk and benefits of repeat ultrasound-guided thoracentesis were discussed with the patient as well as discussion of the high likelihood of a recurrent left-sided ex vacuo pneumothorax. As patient is currently not very symptomatic, he wishes to forego thoracentesis at this time. IMPRESSION: Recurrent moderate to large size anechoic pleural effusion however as above, patient does not wish to proceed with ultrasound-guided thoracentesis at this time. Electronically Signed   By: Sandi Mariscal M.D.   On: 12/21/2016 09:41   Ir Thoracentesis Asp Pleural Space W/img Guide  Result Date: 01/01/2017 INDICATION: Patient with esophageal cancer, recurrent bilateral pleural effusions. Request is made for therapeutic right thoracentesis. EXAM: ULTRASOUND GUIDED THERAPEUTIC THORACENTESIS MEDICATIONS: 10 mL 1% lidocaine COMPLICATIONS: None immediate. PROCEDURE: An ultrasound guided thoracentesis was thoroughly discussed with the patient and questions answered. The benefits, risks, alternatives and complications were also discussed. The patient understands and wishes to proceed with the procedure. Written consent was obtained. Ultrasound was performed to localize and mark an adequate pocket of fluid in the right chest. The area was then prepped and draped in the normal sterile fashion. 1% Lidocaine was used for local anesthesia. Under ultrasound guidance a Safe-T-Centesis catheter was introduced. Thoracentesis was performed. The catheter was removed and a dressing applied. FINDINGS: A total of  approximately 2.2 liters of yellow fluid was removed. IMPRESSION: Successful ultrasound guided right therapeutic thoracentesis yielding 2.2 liters of pleural fluid. Read by:  Brynda Greathouse PA-C Electronically Signed   By: Jerilynn Mages.  Shick M.D.   On: 01/01/2017 16:53   Ir Thoracentesis Asp Pleural Space W/img Guide  Result Date: 12/19/2016 INDICATION: Esophageal cancer, recurrent pleural effusions. Request made for diagnostic and therapeutic right thoracentesis. EXAM: ULTRASOUND GUIDED DIAGNOSTIC AND THERAPEUTIC RIGHT THORACENTESIS MEDICATIONS: None. COMPLICATIONS: None immediate. PROCEDURE: An ultrasound guided thoracentesis was thoroughly discussed with the patient and questions answered. The benefits, risks, alternatives and complications were also discussed. The patient understands and wishes to proceed with the procedure. Written consent was obtained. Ultrasound was performed to localize and mark an adequate pocket of fluid in the right chest. The area was then prepped and draped in the normal sterile fashion. 1% Lidocaine was used for local anesthesia. Under ultrasound guidance a Safe-T-Centesis catheter was introduced. Thoracentesis was performed. The catheter was removed and a dressing applied. FINDINGS: A total of approximately 2.1 liters of yellow fluid was removed. Samples were sent to the laboratory as requested by the clinical team. IMPRESSION: Successful ultrasound guided diagnostic and therapeutic right thoracentesis yielding 2.1 liters of pleural fluid. Read by: Rowe Robert, PA-C Electronically Signed   By: Jacqulynn Cadet M.D.   On: 12/19/2016 16:41   US Thoracentesis  Asp Pleural Space W/img Guide  Result Date: 12/26/2016 INDICATION: Esophageal cancer, dyspnea, recurrent bilateral pleural effusions. Request made for therapeutic left thoracentesis. EXAM: ULTRASOUND GUIDED THERAPEUTIC LEFT THORACENTESIS MEDICATIONS: None. COMPLICATIONS: None immediate. PROCEDURE: An ultrasound guided thoracentesis  was thoroughly discussed with the patient and questions answered. The benefits, risks, alternatives and complications were also discussed. The patient understands and wishes to proceed with the procedure. Written consent was obtained. Ultrasound was performed to localize and mark an adequate pocket of fluid in the left chest. The area was then prepped and draped in the normal sterile fashion. 1% Lidocaine was used for local anesthesia. Under ultrasound guidance a Safe-T-Centesis catheter was introduced. Thoracentesis was performed. The catheter was removed and a dressing applied. FINDINGS: A total of approximately 2.1 liters of hazy, yellow fluid was removed. IMPRESSION: Successful ultrasound guided therapeutic left thoracentesis yielding 2.1 liters of pleural fluid. Read by: Rowe Robert, PA-C Electronically Signed   By: Sandi Mariscal M.D.   On: 12/26/2016 12:12   US Thoracentesis Asp Pleural Space W/img Guide  Result Date: 12/06/2016 INDICATION: Esophageal cancer, dyspnea, recurrent bilateral pleural effusions. Request made for diagnostic and therapeutic left thoracentesis. EXAM: ULTRASOUND GUIDED DIAGNOSTIC AND THERAPEUTIC LEFT THORACENTESIS MEDICATIONS: None. COMPLICATIONS: None immediate. PROCEDURE: An ultrasound guided thoracentesis was thoroughly discussed with the patient and questions answered. The benefits, risks, alternatives and complications were also discussed. The patient understands and wishes to proceed with the procedure. Written consent was obtained. Ultrasound was performed to localize and mark an adequate pocket of fluid in the left chest. The area was then prepped and draped in the normal sterile fashion. 1% Lidocaine was used for local anesthesia. Under ultrasound guidance a Safe-T-Centesis catheter was introduced. Thoracentesis was performed. The catheter was removed and a dressing applied. FINDINGS: A total of approximately 1.9 liters of blood-tinged fluid was removed. Samples were sent to  the laboratory as requested by the clinical team. IMPRESSION: Successful ultrasound guided diagnostic and therapeutic left thoracentesis yielding 1.9 liters of pleural fluid. Post procedure chest x-ray revealed approximately 10-15% left pneumothorax, probable ex vacuo in origin. Patient currently with minimal discomfort. He was instructed to return to ED with any worsening chest pain. Read by: Rowe Robert, PA-C Electronically Signed   By: Jerilynn Mages.  Shick M.D.   On: 12/06/2016 15:55   US Thoracentesis Asp Pleural Space W/img Guide  Result Date: 12/04/2016 INDICATION: Esophageal cancer, recurrent bilateral pleural effusions. Request made for diagnostic and therapeutic right thoracentesis. EXAM: ULTRASOUND GUIDED DIAGNOSTIC AND THERAPEUTIC RIGHT THORACENTESIS MEDICATIONS: None. COMPLICATIONS: None immediate. PROCEDURE: An ultrasound guided thoracentesis was thoroughly discussed with the patient and questions answered. The benefits, risks, alternatives and complications were also discussed. The patient understands and wishes to proceed with the procedure. Written consent was obtained. Ultrasound was performed to localize and mark an adequate pocket of fluid in the right chest. The area was then prepped and draped in the normal sterile fashion. 1% Lidocaine was used for local anesthesia. Under ultrasound guidance a Safe-T-Centesis catheter was introduced. Thoracentesis was performed. The catheter was removed and a dressing applied. FINDINGS: A total of approximately 2.2 liters of yellow fluid was removed. Samples were sent to the laboratory as requested by the clinical team. IMPRESSION: Successful ultrasound guided diagnostic and therapeutic right thoracentesis yielding 2.2 liters of pleural fluid. Read by: Rowe Robert, PA-C Electronically Signed   By: Sandi Mariscal M.D.   On: 12/04/2016 16:15     I have independently reviewed the above radiologic studies.  Recent Lab Findings:  Lab Results  Component Value Date    WBC 1.6 (L) 01/01/2017   HGB 13.0 01/01/2017   HCT 39.9 01/01/2017   PLT Clumped Platelets--Appears Adequate 01/01/2017   GLUCOSE 95 12/26/2016   ALT 17 12/26/2016   AST 24 12/26/2016   NA 144 12/26/2016   K 4.3 12/26/2016   CL 102 09/06/2016   CREATININE 0.9 12/26/2016   BUN 27.6 (H) 12/26/2016   CO2 31 (H) 12/26/2016   INR 1.08 09/06/2016      Assessment / Plan:      Recurrent pleural effusions. Plan placement of left plurix today for control of effusion, when returns on the right will likely place on right later The goals risks and alternatives of the planned surgical procedure Procedure(s): INSERTION PLEURAL DRAINAGE CATHETER (Left)  have been discussed with the patient in detail. The risks of the procedure including death, infection,  myocardial infarction, bleeding, blood transfusion have all been discussed specifically.  I have quoted David [name] a 1% of perioperative mortality and a complication rate as high as 10 %. The patient's questions have been answered.David [name] is willing  to proceed with the planned procedure.  David Isaac MD      [address] [place] [address] Office [phone]   Beeper [phone]  01/03/2017 7:34 AM

## 2017-01-04 ENCOUNTER — Encounter (HOSPITAL_COMMUNITY): Payer: Self-pay | Admitting: Cardiothoracic Surgery

## 2017-01-04 ENCOUNTER — Other Ambulatory Visit: Payer: Self-pay

## 2017-01-04 ENCOUNTER — Ambulatory Visit: Payer: Medicare Other | Admitting: Oncology

## 2017-01-04 DIAGNOSIS — J910 Malignant pleural effusion: Secondary | ICD-10-CM | POA: Diagnosis not present

## 2017-01-04 DIAGNOSIS — I10 Essential (primary) hypertension: Secondary | ICD-10-CM | POA: Diagnosis not present

## 2017-01-04 DIAGNOSIS — K2270 Barrett's esophagus without dysplasia: Secondary | ICD-10-CM | POA: Diagnosis not present

## 2017-01-04 DIAGNOSIS — M1990 Unspecified osteoarthritis, unspecified site: Secondary | ICD-10-CM | POA: Diagnosis not present

## 2017-01-04 DIAGNOSIS — C159 Malignant neoplasm of esophagus, unspecified: Secondary | ICD-10-CM | POA: Diagnosis not present

## 2017-01-04 DIAGNOSIS — F419 Anxiety disorder, unspecified: Secondary | ICD-10-CM | POA: Diagnosis not present

## 2017-01-04 DIAGNOSIS — Z85828 Personal history of other malignant neoplasm of skin: Secondary | ICD-10-CM | POA: Diagnosis not present

## 2017-01-04 DIAGNOSIS — Z4682 Encounter for fitting and adjustment of non-vascular catheter: Secondary | ICD-10-CM | POA: Diagnosis not present

## 2017-01-04 DIAGNOSIS — Z87891 Personal history of nicotine dependence: Secondary | ICD-10-CM | POA: Diagnosis not present

## 2017-01-04 NOTE — Patient Outreach (Signed)
Screening: Inpatient referral however patient was never admitted. Was at [facility] for an elective drainage procedure.  Placed call to patient and explained reason for call.  Patient reports that he is doing well. Reports that he has home health helping him manage his drain. Reports that he is feeling well. Reports that he has no problems with medications. Reports no problems with transportation.   Patient declines needs at this time. Offered to mail letter with contact information and patient has agreed. Confirmed address.  Will notify case management assistant that patient is not interested in services.    Tomasa Rand, RN, BSN, CEN [facility] [employer] [phone]

## 2017-01-05 ENCOUNTER — Other Ambulatory Visit: Payer: Self-pay | Admitting: Oncology

## 2017-01-05 ENCOUNTER — Ambulatory Visit: Payer: Medicare Other

## 2017-01-05 DIAGNOSIS — C159 Malignant neoplasm of esophagus, unspecified: Secondary | ICD-10-CM | POA: Diagnosis not present

## 2017-01-05 DIAGNOSIS — J910 Malignant pleural effusion: Secondary | ICD-10-CM | POA: Diagnosis not present

## 2017-01-05 NOTE — Op Note (Signed)
NAME:  David [name], David [name]                       ACCOUNT NO.:  MEDICAL RECORD NO.:  [id number]  LOCATION:                                 FACILITY:  PHYSICIAN:  Lanelle Bal, MD    DATE OF BIRTH:  02-14-1941  DATE OF PROCEDURE:  01/03/2017 DATE OF DISCHARGE:                              OPERATIVE REPORT   PREOPERATIVE DIAGNOSIS:  Recurrent bilateral pleural effusions, left greater than right with a history of metastatic esophageal cancer.  POSTOPERATIVE DIAGNOSIS:  Recurrent bilateral pleural effusions, left greater than right with a history of metastatic esophageal cancer.  SURGICAL PROCEDURE:  Placement of left PleurX catheter with fluoroscopic and ultrasound guidance.  BRIEF HISTORY:  The patient is a 76 year old male who presented with stage III esophageal cancer, adenocarcinoma of the distal esophagus.  He had previously undergone a course of radiation chemotherapy and ultimately transhiatal total esophagectomy with cervical esophagogastrostomy.  Over the past 6 months, he had noted evidence of recurrent disease and has developed bilateral pleural effusions that have been tapped several times.  Cytologies so far have been negative, but suspicious for malignant effusions.  The patient, early in the week had evidence of bilateral effusions, left greater than right. Thoracentesis of the right removed 2.2 L.  After consultation with Dr. Benay Spice, it was decided to proceed with placement of PleurX catheter. Initially, we decided to place a left PleurX catheter, follow the right side, and as fluid reaccumulates, consider placing a right PleurX. Risks and options were discussed with the patient who agreed and signed informed consent.  DESCRIPTION OF PROCEDURE:  The patient was placed in the supine position with the left side elevated.  Appropriate MAC anesthesia was instituted. The left chest was prepped with Betadine and draped in sterile manner. Appropriate time-out was performed.   The left side had been preoperatively marked using the SonoSite ultrasound, over the left lower chest.  A large pocket of pleural fluid was easily identified; 1% lidocaine was infiltrated.  The 16-gauge needle was introduced into the left chest with return of clear pleural fluid.  A guidewire under fluoroscopic guidance was placed into the left chest.  A counter incision was made anteriorly, and the PleurX catheter tunneled to the insertion site over the guidewire.  Dilator was placed, and then subsequent dilator with peel-away sheath.  The PleurX catheter was then placed into the left chest and fluoroscopically manipulated to obtain a good position.  The peel-away sheath was removed.  The catheter was attached to a suction bottle, and approximately 1 L of clear pleural fluid was removed.  The incision insertion site was closed with an interrupted 4-0 subcuticular stitch.  A nylon suture was used to secure the PleurX in place.  Appropriate dressings were applied.  Sponge and needle count was reported as correct at the completion of the procedure.  Blood loss was minimal.  The 1000 mL of pleural fluid removed was submitted to Pathology for cytologic examination.  The patient tolerated the procedure without obvious complication and was transferred to the recovery room for observation and for postop chest x-ray.     Lanelle Bal, MD  EG/MEDQ  D:  01/05/2017  T:  01/05/2017  Job:  [id number]

## 2017-01-06 DIAGNOSIS — K2270 Barrett's esophagus without dysplasia: Secondary | ICD-10-CM | POA: Diagnosis not present

## 2017-01-06 DIAGNOSIS — C159 Malignant neoplasm of esophagus, unspecified: Secondary | ICD-10-CM | POA: Diagnosis not present

## 2017-01-06 DIAGNOSIS — M1990 Unspecified osteoarthritis, unspecified site: Secondary | ICD-10-CM | POA: Diagnosis not present

## 2017-01-06 DIAGNOSIS — I10 Essential (primary) hypertension: Secondary | ICD-10-CM | POA: Diagnosis not present

## 2017-01-06 DIAGNOSIS — J910 Malignant pleural effusion: Secondary | ICD-10-CM | POA: Diagnosis not present

## 2017-01-06 DIAGNOSIS — F419 Anxiety disorder, unspecified: Secondary | ICD-10-CM | POA: Diagnosis not present

## 2017-01-07 DIAGNOSIS — M1990 Unspecified osteoarthritis, unspecified site: Secondary | ICD-10-CM | POA: Diagnosis not present

## 2017-01-07 DIAGNOSIS — F419 Anxiety disorder, unspecified: Secondary | ICD-10-CM | POA: Diagnosis not present

## 2017-01-07 DIAGNOSIS — I10 Essential (primary) hypertension: Secondary | ICD-10-CM | POA: Diagnosis not present

## 2017-01-07 DIAGNOSIS — C159 Malignant neoplasm of esophagus, unspecified: Secondary | ICD-10-CM | POA: Diagnosis not present

## 2017-01-07 DIAGNOSIS — J910 Malignant pleural effusion: Secondary | ICD-10-CM | POA: Diagnosis not present

## 2017-01-07 DIAGNOSIS — K2270 Barrett's esophagus without dysplasia: Secondary | ICD-10-CM | POA: Diagnosis not present

## 2017-01-08 DIAGNOSIS — F419 Anxiety disorder, unspecified: Secondary | ICD-10-CM | POA: Diagnosis not present

## 2017-01-08 DIAGNOSIS — K2270 Barrett's esophagus without dysplasia: Secondary | ICD-10-CM | POA: Diagnosis not present

## 2017-01-08 DIAGNOSIS — M1990 Unspecified osteoarthritis, unspecified site: Secondary | ICD-10-CM | POA: Diagnosis not present

## 2017-01-08 DIAGNOSIS — J910 Malignant pleural effusion: Secondary | ICD-10-CM | POA: Diagnosis not present

## 2017-01-08 DIAGNOSIS — C159 Malignant neoplasm of esophagus, unspecified: Secondary | ICD-10-CM | POA: Diagnosis not present

## 2017-01-08 DIAGNOSIS — I10 Essential (primary) hypertension: Secondary | ICD-10-CM | POA: Diagnosis not present

## 2017-01-09 ENCOUNTER — Ambulatory Visit (HOSPITAL_BASED_OUTPATIENT_CLINIC_OR_DEPARTMENT_OTHER): Payer: Medicare Other

## 2017-01-09 ENCOUNTER — Telehealth: Payer: Self-pay | Admitting: Oncology

## 2017-01-09 ENCOUNTER — Ambulatory Visit (HOSPITAL_COMMUNITY)
Admission: RE | Admit: 2017-01-09 | Discharge: 2017-01-09 | Disposition: A | Discharge status: Home / Self Care | Payer: Medicare Other | Source: Ambulatory Visit | Attending: General Surgery | Admitting: General Surgery

## 2017-01-09 ENCOUNTER — Other Ambulatory Visit (HOSPITAL_BASED_OUTPATIENT_CLINIC_OR_DEPARTMENT_OTHER): Payer: Medicare Other

## 2017-01-09 ENCOUNTER — Ambulatory Visit (HOSPITAL_BASED_OUTPATIENT_CLINIC_OR_DEPARTMENT_OTHER): Payer: Medicare Other | Admitting: Oncology

## 2017-01-09 ENCOUNTER — Ambulatory Visit (HOSPITAL_COMMUNITY)
Admission: RE | Admit: 2017-01-09 | Discharge: 2017-01-09 | Disposition: A | Discharge status: Home / Self Care | Payer: Medicare Other | Source: Ambulatory Visit | Attending: Oncology | Admitting: Oncology

## 2017-01-09 VITALS — BP 159/80 | HR 77 | Temp 98.1°F | Resp 17 | Ht 75.0 in | Wt 184.4 lb

## 2017-01-09 VITALS — BP 154/80 | HR 67 | Temp 97.8°F | Resp 18

## 2017-01-09 DIAGNOSIS — J939 Pneumothorax, unspecified: Secondary | ICD-10-CM | POA: Insufficient documentation

## 2017-01-09 DIAGNOSIS — C155 Malignant neoplasm of lower third of esophagus: Secondary | ICD-10-CM | POA: Insufficient documentation

## 2017-01-09 DIAGNOSIS — Z5112 Encounter for antineoplastic immunotherapy: Secondary | ICD-10-CM | POA: Diagnosis present

## 2017-01-09 DIAGNOSIS — Z79899 Other long term (current) drug therapy: Secondary | ICD-10-CM | POA: Diagnosis not present

## 2017-01-09 DIAGNOSIS — Z9889 Other specified postprocedural states: Secondary | ICD-10-CM | POA: Insufficient documentation

## 2017-01-09 DIAGNOSIS — Z5111 Encounter for antineoplastic chemotherapy: Secondary | ICD-10-CM | POA: Diagnosis not present

## 2017-01-09 DIAGNOSIS — R0600 Dyspnea, unspecified: Secondary | ICD-10-CM | POA: Diagnosis not present

## 2017-01-09 DIAGNOSIS — D701 Agranulocytosis secondary to cancer chemotherapy: Secondary | ICD-10-CM | POA: Diagnosis not present

## 2017-01-09 DIAGNOSIS — I10 Essential (primary) hypertension: Secondary | ICD-10-CM

## 2017-01-09 DIAGNOSIS — J90 Pleural effusion, not elsewhere classified: Secondary | ICD-10-CM | POA: Diagnosis not present

## 2017-01-09 LAB — TSH: TSH: 2.737 m[IU]/L (ref 0.320–4.118)

## 2017-01-09 LAB — COMPREHENSIVE METABOLIC PANEL
ALBUMIN: 2.2 g/dL — AB (ref 3.5–5.0)
ALT: 18 U/L (ref 0–55)
AST: 26 U/L (ref 5–34)
Alkaline Phosphatase: 93 U/L (ref 40–150)
Anion Gap: 6 mEq/L (ref 3–11)
BUN: 21.6 mg/dL (ref 7.0–26.0)
CALCIUM: 8.7 mg/dL (ref 8.4–10.4)
CHLORIDE: 107 meq/L (ref 98–109)
CO2: 32 mEq/L — ABNORMAL HIGH (ref 22–29)
CREATININE: 0.8 mg/dL (ref 0.7–1.3)
EGFR: 87 mL/min/{1.73_m2} — ABNORMAL LOW (ref >90)
GLUCOSE: 90 mg/dL (ref 70–140)
POTASSIUM: 4 meq/L (ref 3.5–5.1)
SODIUM: 145 meq/L (ref 136–145)
Total Bilirubin: 0.35 mg/dL (ref 0.20–1.20)
Total Protein: 5.7 g/dL — ABNORMAL LOW (ref 6.4–8.3)

## 2017-01-09 LAB — CBC WITH DIFFERENTIAL/PLATELET
BASO%: 1.1 % (ref 0.0–2.0)
Basophils Absolute: 0 10*3/uL (ref 0.0–0.1)
EOS ABS: 0 10*3/uL (ref 0.0–0.5)
EOS%: 1.2 % (ref 0.0–7.0)
HCT: 40.4 % (ref 38.4–49.9)
HGB: 13.3 g/dL (ref 13.0–17.1)
LYMPH%: 14.5 % (ref 14.0–49.0)
MCH: 34.4 pg — AB (ref 27.2–33.4)
MCHC: 33 g/dL (ref 32.0–36.0)
MCV: 104.1 fL — AB (ref 79.3–98.0)
MONO#: 0.5 10*3/uL (ref 0.1–0.9)
MONO%: 18.7 % — ABNORMAL HIGH (ref 0.0–14.0)
NEUT%: 64.5 % (ref 39.0–75.0)
NEUTROS ABS: 1.8 10*3/uL (ref 1.5–6.5)
PLATELETS: 176 10*3/uL (ref 140–400)
RBC: 3.88 10*6/uL — AB (ref 4.20–5.82)
RDW: 15.2 % — ABNORMAL HIGH (ref 11.0–14.6)
WBC: 2.9 10*3/uL — AB (ref 4.0–10.3)
lymph#: 0.4 10*3/uL — ABNORMAL LOW (ref 0.9–3.3)

## 2017-01-09 MED ORDER — DIPHENHYDRAMINE HCL 50 MG/ML IJ SOLN
25.0000 mg | Freq: Once | INTRAMUSCULAR | Status: AC
  Administered 2017-01-09: 25 mg via INTRAVENOUS

## 2017-01-09 MED ORDER — SODIUM CHLORIDE 0.9% FLUSH
10.0000 mL | INTRAVENOUS | Status: DC | PRN
  Administered 2017-01-09: 10 mL
  Filled 2017-01-09: qty 10

## 2017-01-09 MED ORDER — LIDOCAINE HCL 2 % IJ SOLN
INTRAMUSCULAR | Status: AC
  Filled 2017-01-09: qty 10

## 2017-01-09 MED ORDER — DIPHENHYDRAMINE HCL 50 MG/ML IJ SOLN
INTRAMUSCULAR | Status: AC
  Filled 2017-01-09: qty 1

## 2017-01-09 MED ORDER — FAMOTIDINE IN NACL 20-0.9 MG/50ML-% IV SOLN
INTRAVENOUS | Status: AC
  Filled 2017-01-09: qty 50

## 2017-01-09 MED ORDER — ACETAMINOPHEN 325 MG PO TABS
650.0000 mg | ORAL_TABLET | Freq: Once | ORAL | Status: AC
  Administered 2017-01-09: 650 mg via ORAL

## 2017-01-09 MED ORDER — SODIUM CHLORIDE 0.9 % IV SOLN
Freq: Once | INTRAVENOUS | Status: AC
  Administered 2017-01-09: 13:00:00 via INTRAVENOUS

## 2017-01-09 MED ORDER — DEXTROSE 5 % IV SOLN
60.0000 mg/m2 | Freq: Once | INTRAVENOUS | Status: AC
  Administered 2017-01-09: 120 mg via INTRAVENOUS
  Filled 2017-01-09: qty 20

## 2017-01-09 MED ORDER — DEXAMETHASONE SODIUM PHOSPHATE 10 MG/ML IJ SOLN
INTRAMUSCULAR | Status: AC
  Filled 2017-01-09: qty 1

## 2017-01-09 MED ORDER — FAMOTIDINE IN NACL 20-0.9 MG/50ML-% IV SOLN
20.0000 mg | Freq: Once | INTRAVENOUS | Status: AC
  Administered 2017-01-09: 20 mg via INTRAVENOUS

## 2017-01-09 MED ORDER — ACETAMINOPHEN 325 MG PO TABS
ORAL_TABLET | ORAL | Status: AC
Start: 2017-01-09 — End: 2017-01-09
  Filled 2017-01-09: qty 2

## 2017-01-09 MED ORDER — HEPARIN SOD (PORK) LOCK FLUSH 100 UNIT/ML IV SOLN
500.0000 [IU] | Freq: Once | INTRAVENOUS | Status: AC | PRN
  Administered 2017-01-09: 500 [IU]
  Filled 2017-01-09: qty 5

## 2017-01-09 MED ORDER — DEXAMETHASONE SODIUM PHOSPHATE 10 MG/ML IJ SOLN
10.0000 mg | Freq: Once | INTRAMUSCULAR | Status: AC
  Administered 2017-01-09: 10 mg via INTRAVENOUS

## 2017-01-09 MED ORDER — SODIUM CHLORIDE 0.9 % IV SOLN
8.0000 mg/kg | Freq: Once | INTRAVENOUS | Status: AC
  Administered 2017-01-09: 600 mg via INTRAVENOUS
  Filled 2017-01-09: qty 10

## 2017-01-09 NOTE — Telephone Encounter (Signed)
Cancelled appt per 9/18 los - left message to confirm appt changes.

## 2017-01-09 NOTE — Patient Instructions (Signed)
[facility] Discharge Instructions for Patients Receiving Chemotherapy  Today you received the following chemotherapy agents: Taxol and Cyramza.  To help prevent nausea and vomiting after your treatment, we encourage you to take your nausea medication: as directed.   If you develop nausea and vomiting that is not controlled by your nausea medication, call the clinic.   BELOW ARE SYMPTOMS THAT SHOULD BE REPORTED IMMEDIATELY:  *FEVER GREATER THAN 100.5 F  *CHILLS WITH OR WITHOUT FEVER  NAUSEA AND VOMITING THAT IS NOT CONTROLLED WITH YOUR NAUSEA MEDICATION  *UNUSUAL SHORTNESS OF BREATH  *UNUSUAL BRUISING OR BLEEDING  TENDERNESS IN MOUTH AND THROAT WITH OR WITHOUT PRESENCE OF ULCERS  *URINARY PROBLEMS  *BOWEL PROBLEMS  UNUSUAL RASH Items with * indicate a potential emergency and should be followed up as soon as possible.  Feel free to call the clinic you have any questions or concerns. The clinic phone number is [phone].  Please show the CHEMO ALERT CARD at check-in to the Emergency Department and triage nurse.   

## 2017-01-09 NOTE — Progress Notes (Signed)
[name]   Diagnosis: Esophagus cancer  INTERVAL HISTORY:   David [name] returns as scheduled. He underwent placement of a left Pleurx on 01/03/2017. The Pleurx is being drained at home. He reports 1 L of fluid was drained yesterday. He developed a skin reaction to tape at the Pleurx site. He will feels the right pleural space needs to be drained.  Good appetite.  Objective:  Vital signs in last 24 hours:  Blood pressure (!) 159/80, pulse 77, temperature 98.1 F (36.7 C), temperature source Oral, resp. rate 17, height '6\' 3"'  (1.905 m), weight 184 lb 6.4 oz (83.6 kg), SpO2 96 %.   Resp: Inspiratory rub at the left upper chest, decreased breath sounds at the lower chest bilaterally Cardio: Regular rate and rhythm GI: No hepatosplenomegaly, nontender Vascular: Pitting edema at the lower leg bilaterally  Skin: Left Pleurx site   Portacath/PICC-without erythema  Lab Results:  Lab Results  Component Value Date   WBC 2.9 (L) 01/09/2017   HGB 13.3 01/09/2017   HCT 40.4 01/09/2017   MCV 104.1 (H) 01/09/2017   PLT 176 01/09/2017   NEUTROABS 1.8 01/09/2017    Medications: I have reviewed the patient's current medications.  Assessment/Plan: 1. Adenocarcinoma of the distal esophagus,[id number]  Presenting with a food impaction and dysphagia 08/20/2014  Staging CT scans of the chest, abdomen, and pelvis 10/06/2014 with indeterminate pulmonary nodules, a 9 mm paraesophageal lymph node, and circumferential thickening of the distal esophagus  PET scan 10/23/2014 confirmed hypermetabolic thickening at the distal esophagus with small paraesophageal nodes having faint nonspecific activity. A pleural-based focus of consolidation at the posterior right lower lobe had low metabolic activity-favored to be a benign process.  Initiation of concurrent weekly Taxol/carboplatin and radiation on 11/02/2014, radiation completed 12/09/2014  CTs 12/31/2014 revealed  distal esophageal wall thickening with adjacent small lymph nodes suspicious for metastases, stable lung nodule posterior to the right bronchus intermedius and the persistent 5 mm nodule in the right lower lobe  Esophagectomy 02/01/2015 with the pathology confirming a ypT3,ypN3 tumor, positive distal margin  MSI-stable, tumor mutation burden-5  CT chest 07/17/2016-bilateral pleural effusions, Bibasilar airspace opacities with peribronchial vascular nodularity, new low-density right liver lesion  Left thoracentesis 07/19/2016-cytology showedreactive mesothelial cells; mixed lymphoid population  Ultrasound-guided biopsy of a segment 8 liver lesion on 08/01/2016-adenocarcinoma  Cycle 1 FOLFOX 09/06/2016  Cycle 2 FOLFOX 09/21/2016  Cycle 3 FOLFOX 10/05/2016  Cycle 4 FOLFOX 11/02/2016  Cycle 5 FOLFOX 11/20/2016  CT chest 12/01/2016-increased size of right liver lesion, persistent bilateral pleural effusions  Taxol and ramcirumab 12/12/2016; day 8 Taxol 12/19/2016   Cycle 2 Taxol and ramcirumab 01/09/2017, day 8 Taxol discontinued  2. Prostatic hypertrophy-Status post TUR 01/26/2016  3. Hypertension  4. History of multiple basal cell skin cancers  5. History of thrombocytopenia secondary to chemotherapy  6. PVCs/bicuspid aortic valve-followed by cardiology  7. Port-A-Cath placement 09/06/2016  8. Neutropenia secondary to chemotherapy  9. Bilateral pleural effusions, status post right thoracentesis 11/10/2016, left thoracentesis 11/15/2016  Cytology from 11/15/2016-negative  Cytology from 12/04/2016, 12/06/2016, and 12/19/2016-negative  Placement of a left Pleurx catheter 01/03/2017   Disposition:  David [name] appears unchanged. He will continue draining the left Pleurx as directed by Dr. Servando Snare. He sees Dr. Servando Snare later this week to discuss placement of a right Pleurx. He has dyspnea today. He will be referred for a therapeutic right thoracentesis  anticipating that he may undergo placement of a right Pleurx within the next few  weeks.  [name] will continue Taxol/ramcirumab on a day one/day 15 schedule. We dose reduce the Taxol and discontinued day 8 Taxol secondary to neutropenia following cycle 1. He will return for an office visit in 2 weeks.  25 minutes were spent with the patient today. The majority of the time was used for counseling and coordination of care.  Donneta Romberg, MD  01/09/2017  10:45 AM

## 2017-01-09 NOTE — Procedures (Signed)
Ultrasound-guided therapeutic right thoracentesis performed yielding 2 liters of serous colored fluid. No immediate complications. Follow-up chest x-ray pending.       David [name] E 12:12 PM 01/09/2017

## 2017-01-10 ENCOUNTER — Other Ambulatory Visit: Payer: Self-pay | Admitting: Cardiothoracic Surgery

## 2017-01-10 DIAGNOSIS — K2270 Barrett's esophagus without dysplasia: Secondary | ICD-10-CM | POA: Diagnosis not present

## 2017-01-10 DIAGNOSIS — C159 Malignant neoplasm of esophagus, unspecified: Secondary | ICD-10-CM | POA: Diagnosis not present

## 2017-01-10 DIAGNOSIS — F419 Anxiety disorder, unspecified: Secondary | ICD-10-CM | POA: Diagnosis not present

## 2017-01-10 DIAGNOSIS — J910 Malignant pleural effusion: Secondary | ICD-10-CM | POA: Diagnosis not present

## 2017-01-10 DIAGNOSIS — M1990 Unspecified osteoarthritis, unspecified site: Secondary | ICD-10-CM | POA: Diagnosis not present

## 2017-01-10 DIAGNOSIS — I10 Essential (primary) hypertension: Secondary | ICD-10-CM | POA: Diagnosis not present

## 2017-01-11 ENCOUNTER — Ambulatory Visit
Admission: RE | Admit: 2017-01-11 | Discharge: 2017-01-11 | Disposition: A | Discharge status: Home / Self Care | Payer: Medicare Other | Source: Ambulatory Visit | Attending: Cardiothoracic Surgery | Admitting: Cardiothoracic Surgery

## 2017-01-11 ENCOUNTER — Telehealth: Payer: Self-pay | Admitting: Medical Oncology

## 2017-01-11 ENCOUNTER — Ambulatory Visit (INDEPENDENT_AMBULATORY_CARE_PROVIDER_SITE_OTHER): Payer: Medicare Other | Admitting: Cardiothoracic Surgery

## 2017-01-11 ENCOUNTER — Other Ambulatory Visit: Payer: Self-pay | Admitting: *Deleted

## 2017-01-11 ENCOUNTER — Encounter: Payer: Self-pay | Admitting: Cardiothoracic Surgery

## 2017-01-11 ENCOUNTER — Telehealth: Payer: Self-pay

## 2017-01-11 VITALS — BP 140/80 | HR 67 | Resp 20 | Ht 75.0 in | Wt 182.6 lb

## 2017-01-11 DIAGNOSIS — J90 Pleural effusion, not elsewhere classified: Secondary | ICD-10-CM

## 2017-01-11 DIAGNOSIS — C160 Malignant neoplasm of cardia: Secondary | ICD-10-CM | POA: Diagnosis not present

## 2017-01-11 DIAGNOSIS — Z09 Encounter for follow-up examination after completed treatment for conditions other than malignant neoplasm: Secondary | ICD-10-CM

## 2017-01-11 DIAGNOSIS — C159 Malignant neoplasm of esophagus, unspecified: Secondary | ICD-10-CM

## 2017-01-11 DIAGNOSIS — J939 Pneumothorax, unspecified: Secondary | ICD-10-CM | POA: Diagnosis not present

## 2017-01-11 NOTE — Telephone Encounter (Signed)
LMOM with Olivia Mackie Snider/RN with [facility] with new orders: to drain left pleurx catheter daily. He will be having a right PleurX catheter placed next week. And will be needing extra PleurX supplies.

## 2017-01-11 NOTE — Telephone Encounter (Signed)
Tracey from [facility] called and requested PT /OT . Okay per Dr Benay Spice and order given to [facility].

## 2017-01-11 NOTE — Progress Notes (Signed)
[address]       [place] [address]             [phone]      Harrold A Andal [place] [facility] Medical Record #[id number] Date of Birth: 05-Jun-1940  Referring: Ladell Pier, MD Primary Care: Marton Redwood, MD  Chief Complaint:   POST OP FOLLOW UP  History of Present Illness:     Patient comes in the office after placement of left Pleurx catheter, he's been draining the left Pleurx every other day with 900 to 100  mL drained. He had a repeat thoracentesis on the right done 2 days ago.  Cancer Staging Cancer of lower third of esophagus ([place]) Staging form: Esophagus - Squamous Cell Carcinoma, AJCC 7th Edition - Clinical: Stage IIIB (T3, N2, M0) - Signed by Ladell Pier, MD on 10/19/2014 - Pathologic stage from 02/01/2015: Stage IIIC (T3, N3, cM0, G4 - Undifferentiated, Location: Middle) - Signed by Grace Isaac, MD on 02/04/2015    Past Medical History:  Diagnosis Date  . Allergy   . Anxiety   . Arthritis   . [name]'s esophagus   . Basal cell carcinoma    nose, face, back  . Dyspnea   . Dysrhythmia   . Enlarged prostate   . Esophageal cancer ([place])   . Food impaction of esophagus 08/20/2014  . GERD (gastroesophageal reflux disease)    needs 30* elevation since total esophagectomy -uses wedge at home  . Heart murmur   . Hypertension   . Kidney stone   . Liver lesion   . Pleural effusion on left   . Radiation   . Urinary frequency   . Wears glasses      History  Smoking Status  . Former Smoker  . Quit date: 06/07/1961  Smokeless Tobacco  . Never Used    History  Alcohol Use No     Allergies  Allergen Reactions  . Dilaudid [Hydromorphone Hcl] Hives and Itching  . Nexium [Esomeprazole Magnesium] Other (See Comments)    Difficulty urinating and passing stool; dry mouth  . Other     Thinks it was oxycodone; made him hallucinate  . Oxycodone Nausea And Vomiting  . Statins Other (See Comments)    Leg cramping    Current  Outpatient Prescriptions  Medication Sig Dispense Refill  . ALPRAZolam (XANAX) 0.5 MG tablet Take 0.5 mg by mouth at bedtime.   4  . b complex vitamins capsule Take 1 capsule by mouth daily.    . cyproheptadine (PERIACTIN) 2 MG/5ML syrup Take 4 mg by mouth 4 (four) times daily. 10 ml prior to each meal & in the morning    . flecainide (TAMBOCOR) 100 MG tablet TAKE 1 TABLET (100 MG TOTAL) BY MOUTH 2 (TWO) TIMES DAILY. 60 tablet 8  . HYDROcodone-acetaminophen (NORCO/VICODIN) 5-325 MG tablet Take 1-2 tablets by mouth every 6 (six) hours as needed for moderate pain. 40 tablet 0  . metoprolol succinate (TOPROL-XL) 50 MG 24 hr tablet Take 50 mg by mouth daily.    . Multiple Vitamin (MULTIVITAMIN) tablet Take 1 tablet by mouth daily.    . Naproxen Sodium (ALEVE) 220 MG CAPS Take 220 mg by mouth 2 (two) times daily as needed (for pain.).    Marland Kitchen ondansetron (ZOFRAN) 8 MG tablet Take 1 tablet (8 mg total) by mouth every 8 (eight) hours as needed for nausea or vomiting. 30 tablet 2  . pantoprazole (PROTONIX) 20 MG  tablet Take 1 tablet (20 mg total) by mouth daily. 30 tablet 1  . Probiotic CAPS Take 1 capsule by mouth daily.    . prochlorperazine (COMPAZINE) 10 MG tablet Take 10 mg by mouth every 6 (six) hours as needed for nausea or vomiting.     No current facility-administered medications for this visit.    Facility-Administered Medications Ordered in Other Visits  Medication Dose Route Frequency Provider Last Rate Last Dose  . sodium chloride flush (NS) 0.9 % injection 10 mL  10 mL Intravenous PRN Ladell Pier, MD   10 mL at 10/05/16 0933       Physical Exam: BP 140/80   Pulse 67   Resp 20   Ht 6\' 3"  (1.905 m)   Wt 182 lb 9.6 oz (82.8 kg)   SpO2 94% Comment: RA  BMI 22.82 kg/m   General appearance: alert and cooperative Neurologic: intact Heart: regular rate and rhythm, S1, S2 normal, no murmur, click, rub or gallop Lungs: diminished breath sounds bibasilar Abdomen: soft, non-tender;  bowel sounds normal; no masses,  no organomegaly Extremities: extremities normal, atraumatic, no cyanosis or edema and Homans sign is negative, no sign of DVT Wound: Left Pleurx in tact, patient did have some blistering from the tape the Pleurx changing to paper tape has improved this   Diagnostic Studies & Laboratory data:     Recent Radiology Findings:   Dg Chest 2 View  Result Date: 01/11/2017 CLINICAL DATA:  Malignant neoplasm esophagus.  Chest tube EXAM: CHEST  2 VIEW COMPARISON:  01/09/2017 FINDINGS: Left chest tube remains in place and unchanged. Small left apical pneumothorax unchanged. Small left effusion unchanged. Bibasilar airspace disease unchanged. Small right effusion unchanged. Port-A-Cath tip in the SVC. IMPRESSION: Small left apical pneumothorax unchanged Bibasilar airspace disease and small bilateral effusions also unchanged. Electronically Signed   By: Franchot Gallo M.D.   On: 01/11/2017 12:41      Recent Lab Findings: Lab Results  Component Value Date   WBC 2.9 (L) 01/09/2017   HGB 13.3 01/09/2017   HCT 40.4 01/09/2017   PLT 176 01/09/2017   GLUCOSE 90 01/09/2017   ALT 18 01/09/2017   AST 26 01/09/2017   NA 145 01/09/2017   K 4.0 01/09/2017   CL 107 01/03/2017   CREATININE 0.8 01/09/2017   BUN 21.6 01/09/2017   CO2 32 (H) 01/09/2017   TSH 2.737 01/09/2017   INR 0.97 01/03/2017      Assessment / Plan:      Recurrent bilateral pleural effusions, with history of esophageal cancer, to date pleural cytologies have been negative for malignancy, but the course of the effusions is very suggestive of recurrent disease I recommended to the patient that we now proceed with placement of a right Pleurx catheter in addition to the one previously placed on the left. We'll also plan to drain the left Pleurx catheter on a daily basis until next week when replace the right Pleurx Tuesday, September 25      Grace Isaac MD      [address] [place] [address] Office [phone]   Beeper [phone]  01/11/2017 1:37 PM

## 2017-01-15 ENCOUNTER — Encounter (HOSPITAL_COMMUNITY): Payer: Self-pay | Admitting: *Deleted

## 2017-01-15 DIAGNOSIS — C159 Malignant neoplasm of esophagus, unspecified: Secondary | ICD-10-CM | POA: Diagnosis not present

## 2017-01-15 DIAGNOSIS — I10 Essential (primary) hypertension: Secondary | ICD-10-CM | POA: Diagnosis not present

## 2017-01-15 DIAGNOSIS — M1990 Unspecified osteoarthritis, unspecified site: Secondary | ICD-10-CM | POA: Diagnosis not present

## 2017-01-15 DIAGNOSIS — K2270 Barrett's esophagus without dysplasia: Secondary | ICD-10-CM | POA: Diagnosis not present

## 2017-01-15 DIAGNOSIS — F419 Anxiety disorder, unspecified: Secondary | ICD-10-CM | POA: Diagnosis not present

## 2017-01-15 DIAGNOSIS — J910 Malignant pleural effusion: Secondary | ICD-10-CM | POA: Diagnosis not present

## 2017-01-15 MED ORDER — DEXTROSE 5 % IV SOLN
1.5000 g | INTRAVENOUS | Status: AC
  Administered 2017-01-16: 1.5 g via INTRAVENOUS
  Filled 2017-01-15: qty 1.5

## 2017-01-16 ENCOUNTER — Telehealth: Payer: Self-pay | Admitting: *Deleted

## 2017-01-16 ENCOUNTER — Ambulatory Visit (HOSPITAL_COMMUNITY): Payer: Medicare Other

## 2017-01-16 ENCOUNTER — Encounter (HOSPITAL_COMMUNITY)
Admission: RE | Disposition: A | Discharge status: Home / Self Care | Payer: Self-pay | Source: Ambulatory Visit | Attending: Cardiothoracic Surgery

## 2017-01-16 ENCOUNTER — Ambulatory Visit (HOSPITAL_COMMUNITY)
Admission: RE | Admit: 2017-01-16 | Discharge: 2017-01-16 | Disposition: A | Discharge status: Home / Self Care | Payer: Medicare Other | Source: Ambulatory Visit | Attending: Cardiothoracic Surgery | Admitting: Cardiothoracic Surgery

## 2017-01-16 ENCOUNTER — Ambulatory Visit: Payer: Medicare Other

## 2017-01-16 ENCOUNTER — Ambulatory Visit (HOSPITAL_COMMUNITY): Payer: Medicare Other | Admitting: Certified Registered Nurse Anesthetist

## 2017-01-16 ENCOUNTER — Encounter (HOSPITAL_COMMUNITY): Payer: Self-pay | Admitting: *Deleted

## 2017-01-16 ENCOUNTER — Other Ambulatory Visit: Payer: Medicare Other

## 2017-01-16 DIAGNOSIS — I499 Cardiac arrhythmia, unspecified: Secondary | ICD-10-CM | POA: Insufficient documentation

## 2017-01-16 DIAGNOSIS — Z85828 Personal history of other malignant neoplasm of skin: Secondary | ICD-10-CM | POA: Insufficient documentation

## 2017-01-16 DIAGNOSIS — C155 Malignant neoplasm of lower third of esophagus: Secondary | ICD-10-CM

## 2017-01-16 DIAGNOSIS — Z8501 Personal history of malignant neoplasm of esophagus: Secondary | ICD-10-CM | POA: Diagnosis not present

## 2017-01-16 DIAGNOSIS — Z91048 Other nonmedicinal substance allergy status: Secondary | ICD-10-CM | POA: Insufficient documentation

## 2017-01-16 DIAGNOSIS — I10 Essential (primary) hypertension: Secondary | ICD-10-CM | POA: Diagnosis not present

## 2017-01-16 DIAGNOSIS — Z87891 Personal history of nicotine dependence: Secondary | ICD-10-CM | POA: Diagnosis not present

## 2017-01-16 DIAGNOSIS — Z9049 Acquired absence of other specified parts of digestive tract: Secondary | ICD-10-CM | POA: Insufficient documentation

## 2017-01-16 DIAGNOSIS — Z96651 Presence of right artificial knee joint: Secondary | ICD-10-CM | POA: Diagnosis not present

## 2017-01-16 DIAGNOSIS — Z888 Allergy status to other drugs, medicaments and biological substances status: Secondary | ICD-10-CM | POA: Diagnosis not present

## 2017-01-16 DIAGNOSIS — F419 Anxiety disorder, unspecified: Secondary | ICD-10-CM | POA: Insufficient documentation

## 2017-01-16 DIAGNOSIS — Z79899 Other long term (current) drug therapy: Secondary | ICD-10-CM | POA: Insufficient documentation

## 2017-01-16 DIAGNOSIS — Z9689 Presence of other specified functional implants: Secondary | ICD-10-CM

## 2017-01-16 DIAGNOSIS — T18128A Food in esophagus causing other injury, initial encounter: Secondary | ICD-10-CM | POA: Diagnosis not present

## 2017-01-16 DIAGNOSIS — K219 Gastro-esophageal reflux disease without esophagitis: Secondary | ICD-10-CM | POA: Diagnosis not present

## 2017-01-16 DIAGNOSIS — J90 Pleural effusion, not elsewhere classified: Secondary | ICD-10-CM

## 2017-01-16 DIAGNOSIS — Z923 Personal history of irradiation: Secondary | ICD-10-CM | POA: Insufficient documentation

## 2017-01-16 DIAGNOSIS — Z885 Allergy status to narcotic agent status: Secondary | ICD-10-CM | POA: Diagnosis not present

## 2017-01-16 DIAGNOSIS — N401 Enlarged prostate with lower urinary tract symptoms: Secondary | ICD-10-CM | POA: Diagnosis not present

## 2017-01-16 DIAGNOSIS — J939 Pneumothorax, unspecified: Secondary | ICD-10-CM | POA: Diagnosis not present

## 2017-01-16 DIAGNOSIS — Z95828 Presence of other vascular implants and grafts: Secondary | ICD-10-CM | POA: Diagnosis not present

## 2017-01-16 DIAGNOSIS — Z4682 Encounter for fitting and adjustment of non-vascular catheter: Secondary | ICD-10-CM | POA: Insufficient documentation

## 2017-01-16 DIAGNOSIS — K2270 Barrett's esophagus without dysplasia: Secondary | ICD-10-CM | POA: Diagnosis not present

## 2017-01-16 DIAGNOSIS — J910 Malignant pleural effusion: Secondary | ICD-10-CM | POA: Diagnosis not present

## 2017-01-16 DIAGNOSIS — C159 Malignant neoplasm of esophagus, unspecified: Secondary | ICD-10-CM | POA: Diagnosis not present

## 2017-01-16 DIAGNOSIS — Z419 Encounter for procedure for purposes other than remedying health state, unspecified: Secondary | ICD-10-CM

## 2017-01-16 DIAGNOSIS — M1990 Unspecified osteoarthritis, unspecified site: Secondary | ICD-10-CM | POA: Diagnosis not present

## 2017-01-16 HISTORY — PX: CHEST TUBE INSERTION: SHX231

## 2017-01-16 HISTORY — DX: Personal history of urinary calculi: Z87.442

## 2017-01-16 LAB — APTT: aPTT: 31 seconds (ref 24–36)

## 2017-01-16 LAB — COMPREHENSIVE METABOLIC PANEL
ALT: 28 U/L (ref 17–63)
AST: 37 U/L (ref 15–41)
Albumin: 2.1 g/dL — ABNORMAL LOW (ref 3.5–5.0)
Alkaline Phosphatase: 71 U/L (ref 38–126)
Anion gap: 4 — ABNORMAL LOW (ref 5–15)
BUN: 25 mg/dL — ABNORMAL HIGH (ref 6–20)
CO2: 32 mmol/L (ref 22–32)
Calcium: 8.1 mg/dL — ABNORMAL LOW (ref 8.9–10.3)
Chloride: 105 mmol/L (ref 101–111)
Creatinine, Ser: 0.85 mg/dL (ref 0.61–1.24)
GFR calc Af Amer: >60 mL/min (ref >60)
GFR calc non Af Amer: >60 mL/min (ref >60)
Glucose, Bld: 91 mg/dL (ref 65–99)
Potassium: 4.7 mmol/L (ref 3.5–5.1)
Sodium: 141 mmol/L (ref 135–145)
Total Bilirubin: 0.5 mg/dL (ref 0.3–1.2)
Total Protein: 5 g/dL — ABNORMAL LOW (ref 6.5–8.1)

## 2017-01-16 LAB — CBC
HCT: 37.2 % — ABNORMAL LOW (ref 39.0–52.0)
Hemoglobin: 11.8 g/dL — ABNORMAL LOW (ref 13.0–17.0)
MCH: 33.1 pg (ref 26.0–34.0)
MCHC: 31.7 g/dL (ref 30.0–36.0)
MCV: 104.5 fL — ABNORMAL HIGH (ref 78.0–100.0)
Platelets: 114 10*3/uL — ABNORMAL LOW (ref 150–400)
RBC: 3.56 MIL/uL — ABNORMAL LOW (ref 4.22–5.81)
RDW: 13.7 % (ref 11.5–15.5)
WBC: 2.7 10*3/uL — ABNORMAL LOW (ref 4.0–10.5)

## 2017-01-16 LAB — PROTIME-INR
INR: 0.98
Prothrombin Time: 12.9 seconds (ref 11.4–15.2)

## 2017-01-16 SURGERY — INSERTION, PLEURAL DRAINAGE CATHETER
Anesthesia: Monitor Anesthesia Care | Site: Chest | Laterality: Right

## 2017-01-16 MED ORDER — FENTANYL CITRATE (PF) 100 MCG/2ML IJ SOLN
INTRAMUSCULAR | Status: DC | PRN
  Administered 2017-01-16: 25 ug via INTRAVENOUS

## 2017-01-16 MED ORDER — PROPOFOL 10 MG/ML IV BOLUS
INTRAVENOUS | Status: AC
  Filled 2017-01-16: qty 20

## 2017-01-16 MED ORDER — FENTANYL CITRATE (PF) 100 MCG/2ML IJ SOLN: 25.0000 ug | INTRAMUSCULAR | Status: DC | PRN

## 2017-01-16 MED ORDER — LIDOCAINE 2% (20 MG/ML) 5 ML SYRINGE
INTRAMUSCULAR | Status: AC
  Filled 2017-01-16: qty 5

## 2017-01-16 MED ORDER — ROCURONIUM BROMIDE 10 MG/ML (PF) SYRINGE
PREFILLED_SYRINGE | INTRAVENOUS | Status: AC
  Filled 2017-01-16: qty 5

## 2017-01-16 MED ORDER — ONDANSETRON HCL 4 MG/2ML IJ SOLN: 4.0000 mg | Freq: Once | INTRAMUSCULAR | Status: DC | PRN

## 2017-01-16 MED ORDER — FENTANYL CITRATE (PF) 250 MCG/5ML IJ SOLN
INTRAMUSCULAR | Status: AC
  Filled 2017-01-16: qty 5

## 2017-01-16 MED ORDER — PROPOFOL 500 MG/50ML IV EMUL
INTRAVENOUS | Status: DC | PRN
  Administered 2017-01-16: 75 ug/kg/min via INTRAVENOUS

## 2017-01-16 MED ORDER — MIDAZOLAM HCL 2 MG/2ML IJ SOLN
INTRAMUSCULAR | Status: AC
  Filled 2017-01-16: qty 2

## 2017-01-16 MED ORDER — LIDOCAINE 1 % OPTIME INJ - NO CHARGE
INTRAMUSCULAR | Status: DC | PRN
  Administered 2017-01-16: 3 mL via INTRADERMAL

## 2017-01-16 MED ORDER — PHENYLEPHRINE 40 MCG/ML (10ML) SYRINGE FOR IV PUSH (FOR BLOOD PRESSURE SUPPORT)
PREFILLED_SYRINGE | INTRAVENOUS | Status: AC
  Filled 2017-01-16: qty 10

## 2017-01-16 MED ORDER — EPHEDRINE SULFATE 50 MG/ML IJ SOLN
INTRAMUSCULAR | Status: AC
  Filled 2017-01-16: qty 1

## 2017-01-16 MED ORDER — SUGAMMADEX SODIUM 200 MG/2ML IV SOLN
INTRAVENOUS | Status: AC
  Filled 2017-01-16: qty 2

## 2017-01-16 MED ORDER — DEXAMETHASONE SODIUM PHOSPHATE 10 MG/ML IJ SOLN
INTRAMUSCULAR | Status: AC
  Filled 2017-01-16: qty 1

## 2017-01-16 MED ORDER — MIDAZOLAM HCL 5 MG/5ML IJ SOLN
INTRAMUSCULAR | Status: DC | PRN
  Administered 2017-01-16: 1 mg via INTRAVENOUS

## 2017-01-16 MED ORDER — ONDANSETRON HCL 4 MG/2ML IJ SOLN
INTRAMUSCULAR | Status: AC
  Filled 2017-01-16: qty 2

## 2017-01-16 MED ORDER — LACTATED RINGERS IV SOLN
INTRAVENOUS | Status: DC | PRN
  Administered 2017-01-16: 08:00:00 via INTRAVENOUS

## 2017-01-16 MED ORDER — PROPOFOL 10 MG/ML IV BOLUS
INTRAVENOUS | Status: DC | PRN
  Administered 2017-01-16: 20 mg via INTRAVENOUS

## 2017-01-16 MED ORDER — 0.9 % SODIUM CHLORIDE (POUR BTL) OPTIME
TOPICAL | Status: DC | PRN
  Administered 2017-01-16: 50 mL

## 2017-01-16 SURGICAL SUPPLY — 28 items
ADH SKN CLS APL DERMABOND .7 (GAUZE/BANDAGES/DRESSINGS) ×1
BRUSH SCRUB EZ PLAIN DRY (MISCELLANEOUS) ×4 IMPLANT
CANISTER SUCT 3000ML PPV (MISCELLANEOUS) ×2 IMPLANT
COVER SURGICAL LIGHT HANDLE (MISCELLANEOUS) ×2 IMPLANT
COVER TRANSDUCER ULTRASND GEL (DRAPE) ×2 IMPLANT
DERMABOND ADVANCED (GAUZE/BANDAGES/DRESSINGS) ×1
DERMABOND ADVANCED .7 DNX12 (GAUZE/BANDAGES/DRESSINGS) ×1 IMPLANT
DRAPE C-ARM 42X72 X-RAY (DRAPES) ×2 IMPLANT
DRAPE LAPAROSCOPIC ABDOMINAL (DRAPES) ×2 IMPLANT
DRSG EMULSION OIL 3X3 NADH (GAUZE/BANDAGES/DRESSINGS) ×1 IMPLANT
GLOVE BIO SURGEON STRL SZ 6.5 (GLOVE) ×4 IMPLANT
GOWN STRL REUS W/ TWL LRG LVL3 (GOWN DISPOSABLE) ×2 IMPLANT
GOWN STRL REUS W/TWL LRG LVL3 (GOWN DISPOSABLE) ×4
KIT BASIN OR (CUSTOM PROCEDURE TRAY) ×2 IMPLANT
KIT PLEURX DRAIN CATH 1000ML (MISCELLANEOUS) ×2 IMPLANT
KIT PLEURX DRAIN CATH 15.5FR (DRAIN) ×2 IMPLANT
KIT ROOM TURNOVER OR (KITS) ×2 IMPLANT
NS IRRIG 1000ML POUR BTL (IV SOLUTION) ×2 IMPLANT
PACK GENERAL/GYN (CUSTOM PROCEDURE TRAY) ×2 IMPLANT
PAD ARMBOARD 7.5X6 YLW CONV (MISCELLANEOUS) ×4 IMPLANT
SET DRAINAGE LINE (MISCELLANEOUS) IMPLANT
SUT ETHILON 3 0 FSL (SUTURE) ×2 IMPLANT
SUT VIC AB 3-0 X1 27 (SUTURE) ×2 IMPLANT
TAPE PAPER 2X10 WHT MICROPORE (GAUZE/BANDAGES/DRESSINGS) ×1 IMPLANT
TOWEL OR 17X24 6PK STRL BLUE (TOWEL DISPOSABLE) ×2 IMPLANT
TOWEL OR 17X26 10 PK STRL BLUE (TOWEL DISPOSABLE) ×2 IMPLANT
VALVE REPLACEMENT CAP (MISCELLANEOUS) IMPLANT
WATER STERILE IRR 1000ML POUR (IV SOLUTION) ×2 IMPLANT

## 2017-01-16 NOTE — H&P (Signed)
[address]       [place] [address]             [phone]                    Colbey A Gruber [place] Medical Record #[id number] Date of Birth: 21-Jun-1940  Referring: Dr Benay Spice  Primary Care: Marton Redwood, MD  Chief Complaint:   No chief complaint on file.   History of Present Illness:    David [name] 76 y.o. male is seen in the hospital   today for pleural effusions . He has had multiple thoracentesis for effuison. Last Monday , 2.2 liters were removed from right. Cytology has been negative so far . A left pleurx was placed 10 days ago , patient comes today for placement on the right . Now left drainage is 500-550 a day       Current Activity/ Functional Status:  Patient is independent with mobility/ambulation, transfers, ADL's, IADL's.   Zubrod Score: At the time of surgery this patient's most appropriate activity status/level should be described as: []     0    Normal activity, no symptoms [x]     1    Restricted in physical strenuous activity but ambulatory, able to do out light work []     2    Ambulatory and capable of self care, unable to do work activities, up and about               >50 % of waking hours                              []     3    Only limited self care, in bed greater than 50% of waking hours []     4    Completely disabled, no self care, confined to bed or chair []     5    Moribund   Past Medical History:  Diagnosis Date  . Allergy   . Anxiety   . Arthritis   . [name]'s esophagus   . Basal cell carcinoma    nose, face, back  . Dyspnea    reports due to fluid in lungs   . Dysrhythmia    skipped beats but not sure rhythm   . Enlarged prostate   . Esophageal cancer ([place])   . Food impaction of esophagus 08/20/2014  . GERD (gastroesophageal reflux disease)    needs 30* elevation since total esophagectomy -uses wedge at home  . Heart murmur   . History of kidney stones   . Hypertension   . Liver lesion   . Pleural  effusion on left   . Radiation   . Urinary frequency   . Wears glasses     Past Surgical History:  Procedure Laterality Date  . APPENDECTOMY  1961  . CERVICAL LAMINECTOMY  1980  . CHEST TUBE INSERTION Left 01/03/2017   Procedure: INSERTION PLEURAL DRAINAGE CATHETER;  Surgeon: Grace Isaac, MD;  Location: [facility];  Service: Thoracic;  Laterality: Left;  . COLONOSCOPY     x2  . COMPLETE ESOPHAGECTOMY [name] 02/01/2015   Procedure: TRANSHIATAL TOTAL ESOPHAGECTOMY  ;  Surgeon: Grace Isaac, MD;  Location: [facility];  Service: Thoracic;  Laterality: [name];  . CYSTOSCOPY WITH STENT PLACEMENT Right 01/26/2016   Procedure: CYSTOSCOPY WITH STENT PLACEMENT;  Surgeon: Franchot Gallo, MD;  Location: [facility];  Service: Urology;  Laterality: Right;  . CYSTOSCOPY/RETROGRADE/URETEROSCOPY/STONE EXTRACTION WITH BASKET Right 01/26/2016   Procedure: CYSTOSCOPY/RETROGRADE/URETEROSCOPY/STONE EXTRACTION WITH BASKET;  Surgeon: Franchot Gallo, MD;  Location: [facility];  Service: Urology;  Laterality: Right;  . ESOPHAGOGASTRODUODENOSCOPY [name] 08/20/2014   Procedure: ESOPHAGOGASTRODUODENOSCOPY (EGD);  Surgeon: Gatha Mayer, MD;  Location: [facility];  Service: Endoscopy;  Laterality: [name];  . ESOPHAGOGASTROSTOMY  02/01/2015   Procedure: CERVICAL ESOPHAGOGASTROSTOMY;  Surgeon: Grace Isaac, MD;  Location: [facility];  Service: Thoracic;;  . EUS [name] 10/15/2014   Procedure: UPPER ENDOSCOPIC ULTRASOUND (EUS) LINEAR;  Surgeon: Milus Banister, MD;  Location: [facility];  Service: Endoscopy;  Laterality: [name];  radial linear  . HOLMIUM LASER APPLICATION Right 61/07/4313   Procedure: HOLMIUM LASER APPLICATION;  Surgeon: Franchot Gallo, MD;  [facility];  Service: Urology;  Laterality: Right;  . IR FLUORO GUIDE PORT INSERTION RIGHT  09/06/2016  . IR THORACENTESIS ASP PLEURAL SPACE W/IMG GUIDE  12/19/2016  . IR THORACENTESIS ASP PLEURAL SPACE W/IMG GUIDE   01/01/2017  . IR US GUIDE VASC ACCESS RIGHT  09/06/2016  . JEJUNOSTOMY  02/01/2015   Procedure: JEJUNOSTOMY;  Surgeon: Grace Isaac, MD;  Location: [facility];  Service: Thoracic;;  . Edwena Blow  02/01/2015   Procedure: Edwena Blow;  Surgeon: Grace Isaac, MD;  Location: [facility];  Service: Thoracic;;  . TOTAL KNEE ARTHROPLASTY  2010   right  . TRANSURETHRAL RESECTION OF PROSTATE [name] 01/26/2016   Procedure: TRANSURETHRAL RESECTION OF THE PROSTATE (TURP);  Surgeon: Franchot Gallo, MD;  [facility];  Service: Urology;  Laterality: [name];    Family History  Problem Relation Age of Onset  . Lymphoma Mother   . Cancer Maternal Uncle   . Cancer Paternal Uncle   . Cancer Paternal Grandmother   . Stomach cancer Neg Hx   . Colon cancer Neg Hx     Social History   Social History  . Marital status: Married    Spouse name: [name]  . Number of children: [name]  . Years of education: [name]   Occupational History  . Not on file.   Social History Main Topics  . Smoking status: Former Smoker    Quit date: 06/07/1961  . Smokeless tobacco: Never Used  . Alcohol use No  . Drug use: No  . Sexual activity: Not Currently   Other Topics Concern  . Not on file   Social History Narrative  . No narrative on file    History  Smoking Status  . Former Smoker  . Quit date: 06/07/1961  Smokeless Tobacco  . Never Used    History  Alcohol Use No     Allergies  Allergen Reactions  . Adhesive [Tape] Dermatitis    Causes blisters and skin irritations  . Dilaudid [Hydromorphone Hcl] Hives and Itching  . Nexium [Esomeprazole Magnesium] Other (See Comments)    Difficulty urinating and passing stool; dry mouth  . Other     Thinks it was oxycodone; made him hallucinate  . Oxycodone Nausea And Vomiting  . Statins Other (See Comments)    Leg cramping    Current Facility-Administered Medications  Medication Dose Route Frequency Provider Last Rate Last Dose  .  cefUROXime (ZINACEF) 1.5 g in dextrose 5 % 50 mL IVPB  1.5 g Intravenous 60 min Pre-Op Grace Isaac, MD       Facility-Administered Medications Ordered in Other Encounters  Medication Dose Route Frequency Provider Last  Rate Last Dose  . sodium chloride flush (NS) 0.9 % injection 10 mL  10 mL Intravenous PRN Ladell Pier, MD   10 mL at 10/05/16 0933    Pertinent items are noted in HPI.   Review of Systems:     Cardiac Review of Systems: Y or N  Chest Pain [ n ]  Resting SOB [ n  ] Exertional SOB  [ y ]  Orthopnea [  ]   Pedal Edema [   ]    Palpitations [  ] Syncope  [  ]   Presyncope [   ]  General Review of Systems: [Y] = yes [  ]=no Constitional: recent weight change Blue.Reese  ];  Wt loss over the last 3 months [   ] anorexia [  ]; fatigue [  ]; nausea [  ]; night sweats [  ]; fever [  ]; or chills [  ];          Dental: poor dentition[  ]; Last Dentist visit:   Eye : blurred vision [  ]; diplopia [   ]; vision changes [  ];  Amaurosis fugax[  ]; Resp: cough [n  ];  wheezing[n  ];  hemoptysis[ n ]; shortness of breath[ y ]; paroxysmal nocturnal dyspnea[  ]; dyspnea on exertion[  ]; or orthopnea[  ];  GI:  gallstones[  ], vomiting[  ];  dysphagia[  ]; melena[  ];  hematochezia [  ]; heartburn[  ];   Hx of  Colonoscopy[  ]; GU: kidney stones [  ]; hematuria[  ];   dysuria [  ];  nocturia[  ];  history of     obstruction [  ]; urinary frequency [  ]             Skin: rash, swelling[  ];, hair loss[  ];  peripheral edema[  ];  or itching[  ]; Musculosketetal: myalgias[  ];  joint swelling[  ];  joint erythema[  ];  joint pain[  ];  back pain[  ];  Heme/Lymph: bruising[  ];  bleeding[  ];  anemia[  ];  Neuro: TIA[  ];  headaches[  ];  stroke[  ];  vertigo[  ];  seizures[  ];   paresthesias[  ];  difficulty walking[n  ];  Psych:depression[  ]; anxiety[  ];  Endocrine: diabetes[  ];  thyroid dysfunction[  ];  Immunizations: Flu up to date [  ]; Pneumococcal up to date [   ];  Other:  Physical Exam: BP (!) 185/83   Pulse 61   Temp 97.9 F (36.6 C) (Oral)   Resp 18   Ht 6\' 3"  (1.905 m)   Wt 180 lb (81.6 kg)   SpO2 98%   BMI 22.50 kg/m   PHYSICAL EXAMINATION: General appearance: alert, cooperative and no distress Head: Normocephalic, without obvious abnormality, atraumatic Neck: no adenopathy, no carotid bruit, no JVD, supple, symmetrical, trachea midline and thyroid not enlarged, symmetric, no tenderness/mass/nodules Lymph nodes: Cervical, supraclavicular, and axillary nodes normal. Resp: diminished breath sounds bibasilar Back: symmetric, no curvature. ROM normal. No CVA tenderness. Cardio: regular rate and rhythm, S1, S2 normal, no murmur, click, rub or gallop GI: soft, non-tender; bowel sounds normal; no masses,  no organomegaly Extremities: extremities normal, atraumatic, no cyanosis or edema Neurologic: Grossly normal Incisions healed well, no cervical or supraclavicular adenopathy   Diagnostic Studies & Laboratory data:     Recent Radiology Findings:  Dg Chest 1 View  Result Date: 01/09/2017 CLINICAL DATA:  Status post right thoracentesis. EXAM: CHEST 1 VIEW COMPARISON:  01/03/2017 FINDINGS: Decreased right pleural effusion, now small. No pneumothorax or re-expansion edema. There is a tunneled pleural catheter on the left with tip more inferior than previous, now at the mid chest level. A small left apical pneumothorax is unchanged. Probable mild cardiomegaly. Stable aortic contours. Stable interstitial coarsening at the bases. IMPRESSION: 1. No acute finding after right thoracentesis. Small residual right pleural fluid. 2. Left tunneled pleural catheter has migrated with tip now at the mid chest rather than apex. Unchanged small left apical pneumothorax. Electronically Signed   By: Monte Fantasia M.D.   On: 01/09/2017 12:32   Dg Chest 1 View  Result Date: 01/01/2017 CLINICAL DATA:  Thoracentesis today, 2.2 L removed. EXAM: CHEST 1 VIEW  COMPARISON:  01/01/2017 at 10:55 a.m. FINDINGS: Significant reduction in the size of the right pleural effusion, with interval re- are a shin of lung at the right lung base. Small residual amount of blunting at the right costophrenic angle. No right pneumothorax. Large left pleural effusion persists. There is an approximately 10% left apical pneumothorax is well, not changed from the previous exam. Power injectable Port-A-Cath tip: SVC. Atherosclerotic calcification of the aortic arch. Thoracic spondylosis. IMPRESSION: 1. Significant reduction in size of the right pleural effusion. No right pneumothorax is identified. 2. Stable large left pleural effusion with passive atelectasis. No change in the appearance of the small LEFT apical pneumothorax. 3.  Aortic Atherosclerosis (ICD10-I70.0). Electronically Signed   By: Van Clines M.D.   On: 01/01/2017 17:00   Dg Chest 1 View  Result Date: 12/26/2016 CLINICAL DATA:  Status post left thoracentesis. EXAM: CHEST 1 VIEW COMPARISON:  12/19/2016 FINDINGS: Minimal right base atelectasis noted with tiny right pleural effusion. Interval decrease in left pleural effusion. Small left pneumothorax visible, similar at the left apex to the prior study and more prominent now at the left base after removal of the left pleural fluid. Cardiopericardial silhouette is at upper limits of normal for size. The visualized bony structures of the thorax are intact. Right Port-A-Cath remains stable. IMPRESSION: 1. Interval decrease in left pleural effusion with residual small left pneumothorax, similar to prior at the left apex. 2. Minimal right base atelectasis with tiny right pleural effusion. Electronically Signed   By: Misty Stanley M.D.   On: 12/26/2016 11:54   Dg Chest 1 View  Result Date: 12/19/2016 CLINICAL DATA:  History of metastatic esophageal carcinoma with recurrent bilateral pleural effusions. Status post right thoracentesis today. Status post left thoracentesis on  12/06/2016. EXAM: CHEST 1 VIEW COMPARISON:  12/06/2016 FINDINGS: The heart size and mediastinal contours are within normal limits. No significant right pleural fluid. No pneumothorax on the right. Diminished size of ex vacuo left pneumothorax with tiny apical component remaining. Recurrence of moderate to large left pleural effusion. The visualized skeletal structures are unremarkable. IMPRESSION: No evidence of right-sided pneumothorax following right thoracentesis today. Decrease in ex vacuo left-sided pneumothorax with reaccumulation of left pleural fluid since the last chest x-ray. Electronically Signed   By: Aletta Edouard M.D.   On: 12/19/2016 17:13   Dg Chest 2 View  Result Date: 01/16/2017 CLINICAL DATA:  Preop.  Recurrent right pleural effusion. EXAM: CHEST  2 VIEW COMPARISON:  Radiograph 01/11/2017 FINDINGS: Tip of the right chest port in the mid SVC. Unchanged elevation of right hemidiaphragm with right pleural effusion. Left chest tube in place. Small left apical  pneumothorax has diminished in size. Decreased left pleural effusion from prior exam. Unchanged heart size and mediastinal contours. Atherosclerosis of the aortic arch. Patchy bibasilar opacities likely atelectasis. IMPRESSION: 1. Slight decreased size of left apical pneumothorax with left chest tube in place. Decreased left pleural effusion. 2. Unchanged elevation of right hemidiaphragm with small right pleural effusion. Electronically Signed   By: Jeb Levering M.D.   On: 01/16/2017 06:59   Dg Chest 2 View  Result Date: 01/11/2017 CLINICAL DATA:  Malignant neoplasm esophagus.  Chest tube EXAM: CHEST  2 VIEW COMPARISON:  01/09/2017 FINDINGS: Left chest tube remains in place and unchanged. Small left apical pneumothorax unchanged. Small left effusion unchanged. Bibasilar airspace disease unchanged. Small right effusion unchanged. Port-A-Cath tip in the SVC. IMPRESSION: Small left apical pneumothorax unchanged Bibasilar airspace  disease and small bilateral effusions also unchanged. Electronically Signed   By: Franchot Gallo M.D.   On: 01/11/2017 12:41   Dg Chest 2 View  Result Date: 01/03/2017 CLINICAL DATA:  Preop evaluation for upcoming PleurX placement EXAM: CHEST  2 VIEW COMPARISON:  01/01/2017 FINDINGS: Stable appearing left pleural effusion is noted. Right chest wall port is seen. Cardiac shadow is at the upper limits of normal size but stable. Blunting of the right costophrenic angle is again seen. Aortic atherosclerotic changes are noted. Mild atelectatic changes in right base are seen. A small left apical pneumothorax is stable when compare with the prior exam. IMPRESSION: Moderate size left pleural effusion. Stable left pneumothorax. No significant interval change from the prior exam is noted. Electronically Signed   By: Inez Catalina M.D.   On: 01/03/2017 07:56   Dg Chest 2 View  Result Date: 01/01/2017 CLINICAL DATA:  Shortness of breath all weekend, history of BILATERAL pleural effusions, distal esophageal cancer, hypertension EXAM: CHEST  2 VIEW COMPARISON:  12/26/2016 FINDINGS: RIGHT jugular Port-A-Cath stable with tip projecting over SVC. Normal heart size, mediastinal contours, and pulmonary vascularity. Increasing BILATERAL pleural effusion since previous exam with associated bibasilar atelectasis. Upper lungs clear. LEFT apex pneumothorax again identified are prominent at apex than on previous study but likely related to movement of the previously seen LEFT basilar pneumothorax component to the cranial aspect of the LEFT hemithorax. No RIGHT pneumothorax identified. IMPRESSION: Increased bibasilar effusions with persistent LEFT pneumothorax. Electronically Signed   By: Lavonia Dana M.D.   On: 01/01/2017 11:11   Dg Chest Port 1 View  Result Date: 01/03/2017 CLINICAL DATA:  Followup pneumothorax. EXAM: PORTABLE CHEST 1 VIEW COMPARISON:  01/03/2017 FINDINGS: Interval placement of left chest tube. There is a  left-sided hydropneumothorax. The fluid component along the left lower lobe has decreased in the interval. Persistent pneumothorax component. Airspace opacities within the right lower lobe have increased in the interval. IMPRESSION: 1. Decrease in left pleural fluid status post chest tube placement. Persistent left-sided pneumothorax. 2. Worsening aeration to the right lung base. Electronically Signed   By: Kerby Moors M.D.   On: 01/03/2017 09:38   Dg C-arm 1-60 Min-no Report  Result Date: 01/03/2017 Fluoroscopy was utilized by the requesting physician.  No radiographic interpretation.   Ir US Chest  Result Date: 12/21/2016 CLINICAL DATA:  History of esophageal carcinoma, now with recurrent symptomatic bilateral pleural effusions. Patient recently underwent right-sided thoracentesis on 12/19/2016 and presents today for attempted left-sided thoracentesis. Note, patient experienced an ex vacuo pneumothorax following most recent left-sided thoracentesis on 11/15/2016. EXAM: CHEST ULTRASOUND COMPARISON:  Right-sided thoracentesis - 12/19/2016; 12/04/2016; 11/10/2016; left-sided thoracentesis - 11/15/2016; chest radiograph -  12/19/2016; 12/06/2016; 12/04/2016; 11/15/2016; 11/10/2016 FINDINGS: Sonographic evaluation of the left hemithorax demonstrates development of a recurrent moderate to large sized anechoic pleural effusion. Risk and benefits of repeat ultrasound-guided thoracentesis were discussed with the patient as well as discussion of the high likelihood of a recurrent left-sided ex vacuo pneumothorax. As patient is currently not very symptomatic, he wishes to forego thoracentesis at this time. IMPRESSION: Recurrent moderate to large size anechoic pleural effusion however as above, patient does not wish to proceed with ultrasound-guided thoracentesis at this time. Electronically Signed   By: Sandi Mariscal M.D.   On: 12/21/2016 09:41   Ir Thoracentesis Asp Pleural Space W/img Guide  Result Date:  01/01/2017 INDICATION: Patient with esophageal cancer, recurrent bilateral pleural effusions. Request is made for therapeutic right thoracentesis. EXAM: ULTRASOUND GUIDED THERAPEUTIC THORACENTESIS MEDICATIONS: 10 mL 1% lidocaine COMPLICATIONS: None immediate. PROCEDURE: An ultrasound guided thoracentesis was thoroughly discussed with the patient and questions answered. The benefits, risks, alternatives and complications were also discussed. The patient understands and wishes to proceed with the procedure. Written consent was obtained. Ultrasound was performed to localize and mark an adequate pocket of fluid in the right chest. The area was then prepped and draped in the normal sterile fashion. 1% Lidocaine was used for local anesthesia. Under ultrasound guidance a Safe-T-Centesis catheter was introduced. Thoracentesis was performed. The catheter was removed and a dressing applied. FINDINGS: A total of approximately 2.2 liters of yellow fluid was removed. IMPRESSION: Successful ultrasound guided right therapeutic thoracentesis yielding 2.2 liters of pleural fluid. Read by:  Brynda Greathouse PA-C Electronically Signed   By: Jerilynn Mages.  Shick M.D.   On: 01/01/2017 16:53   Ir Thoracentesis Asp Pleural Space W/img Guide  Result Date: 12/19/2016 INDICATION: Esophageal cancer, recurrent pleural effusions. Request made for diagnostic and therapeutic right thoracentesis. EXAM: ULTRASOUND GUIDED DIAGNOSTIC AND THERAPEUTIC RIGHT THORACENTESIS MEDICATIONS: None. COMPLICATIONS: None immediate. PROCEDURE: An ultrasound guided thoracentesis was thoroughly discussed with the patient and questions answered. The benefits, risks, alternatives and complications were also discussed. The patient understands and wishes to proceed with the procedure. Written consent was obtained. Ultrasound was performed to localize and mark an adequate pocket of fluid in the right chest. The area was then prepped and draped in the normal sterile fashion. 1%  Lidocaine was used for local anesthesia. Under ultrasound guidance a Safe-T-Centesis catheter was introduced. Thoracentesis was performed. The catheter was removed and a dressing applied. FINDINGS: A total of approximately 2.1 liters of yellow fluid was removed. Samples were sent to the laboratory as requested by the clinical team. IMPRESSION: Successful ultrasound guided diagnostic and therapeutic right thoracentesis yielding 2.1 liters of pleural fluid. Read by: Rowe Robert, PA-C Electronically Signed   By: Jacqulynn Cadet M.D.   On: 12/19/2016 16:41   US Thoracentesis Asp Pleural Space W/img Guide  Result Date: 01/09/2017 INDICATION: History of esophageal cancer with recurrent right pleural effusion. Request is made for therapeutic thoracentesis. EXAM: ULTRASOUND GUIDED THERAPEUTIC THORACENTESIS MEDICATIONS: 2% xylocaine COMPLICATIONS: None immediate. PROCEDURE: An ultrasound guided thoracentesis was thoroughly discussed with the patient and questions answered. The benefits, risks, alternatives and complications were also discussed. The patient understands and wishes to proceed with the procedure. Written consent was obtained. Ultrasound was performed to localize and mark an adequate pocket of fluid in the right chest. The area was then prepped and draped in the normal sterile fashion. 2% xylocaine was used for local anesthesia. Under ultrasound guidance a Safe-T-Centesis catheter was introduced. Thoracentesis was performed. The catheter was removed  and a dressing applied. FINDINGS: A total of approximately 2 L of serous fluid was removed. The patient asked to terminate the procedure at 2 L mark. IMPRESSION: Successful ultrasound guided right thoracentesis yielding 2 L of pleural fluid. Read by: Saverio Danker, PA-C Electronically Signed   By: Aletta Edouard M.D.   On: 01/09/2017 14:25   US Thoracentesis Asp Pleural Space W/img Guide  Result Date: 12/26/2016 INDICATION: Esophageal cancer, dyspnea,  recurrent bilateral pleural effusions. Request made for therapeutic left thoracentesis. EXAM: ULTRASOUND GUIDED THERAPEUTIC LEFT THORACENTESIS MEDICATIONS: None. COMPLICATIONS: None immediate. PROCEDURE: An ultrasound guided thoracentesis was thoroughly discussed with the patient and questions answered. The benefits, risks, alternatives and complications were also discussed. The patient understands and wishes to proceed with the procedure. Written consent was obtained. Ultrasound was performed to localize and mark an adequate pocket of fluid in the left chest. The area was then prepped and draped in the normal sterile fashion. 1% Lidocaine was used for local anesthesia. Under ultrasound guidance a Safe-T-Centesis catheter was introduced. Thoracentesis was performed. The catheter was removed and a dressing applied. FINDINGS: A total of approximately 2.1 liters of hazy, yellow fluid was removed. IMPRESSION: Successful ultrasound guided therapeutic left thoracentesis yielding 2.1 liters of pleural fluid. Read by: Rowe Robert, PA-C Electronically Signed   By: Sandi Mariscal M.D.   On: 12/26/2016 12:12     I have independently reviewed the above radiologic studies.  Recent Lab Findings: Lab Results  Component Value Date   WBC 2.7 (L) 01/16/2017   HGB 11.8 (L) 01/16/2017   HCT 37.2 (L) 01/16/2017   PLT PENDING 01/16/2017   GLUCOSE 90 01/09/2017   ALT 18 01/09/2017   AST 26 01/09/2017   NA 145 01/09/2017   K 4.0 01/09/2017   CL 107 01/03/2017   CREATININE 0.8 01/09/2017   BUN 21.6 01/09/2017   CO2 32 (H) 01/09/2017   TSH 2.737 01/09/2017   INR 0.97 01/03/2017      Assessment / Plan:      Recurrent pleural effusions. Plan placement of right  plurix today for control of effusion,  The goals risks and alternatives of the planned surgical procedure Procedure(s): INSERTION PLEURAL DRAINAGE CATHETER (Right)  have been discussed with the patient in detail. The risks of the procedure including death,  infection,  myocardial infarction, bleeding, blood transfusion have all been discussed specifically.  I have quoted David [name] a 1% of perioperative mortality and a complication rate as high as 10 %. The patient's questions have been answered.David [name] is willing  to proceed with the planned procedure.  Grace Isaac MD      [address] [place] [address] Office [phone]   Beeper [phone]  01/16/2017 7:24 AM

## 2017-01-16 NOTE — Anesthesia Postprocedure Evaluation (Signed)
Anesthesia Post Note  Patient: David [name]  Procedure(s) Performed: Procedure(s) (LRB): INSERTION PLEURAL DRAINAGE CATHETER (Right)     Patient location during evaluation: PACU Anesthesia Type: MAC Level of consciousness: awake and alert Pain management: pain level controlled Vital Signs Assessment: post-procedure vital signs reviewed and stable Respiratory status: spontaneous breathing, nonlabored ventilation, respiratory function stable and patient connected to nasal cannula oxygen Cardiovascular status: stable and blood pressure returned to baseline Postop Assessment: no apparent nausea or vomiting Anesthetic complications: no    Last Vitals:  Vitals:   01/16/17 0945 01/16/17 1000  BP:    Pulse: (!) 59 63  Resp: 16 14  Temp: (!) 36.2 C   SpO2: 100% 99%    Last Pain:  Vitals:   01/16/17 1000  TempSrc:   PainSc: 0-No pain                 Ryan P Ellender

## 2017-01-16 NOTE — Brief Op Note (Addendum)
      [address]       [place] [address]             [phone]     01/16/2017  8:33 AM  PATIENT:  David [name]  76 y.o. male  PRE-OPERATIVE DIAGNOSIS:  RIGHT PLEURAL EFFUSION  POST-OPERATIVE DIAGNOSIS:  RIGHT PLEURAL EFFUSION  PROCEDURE:  Procedure(s): INSERTION PLEURAL DRAINAGE CATHETER (Right) Drainage of 1000 ml right pleural fluid   SURGEON:  Surgeon(s) and Role:    * Grace Isaac, MD - Primary   ASSISTANTS: none   ANESTHESIA:   MAC  EBL:  None  BLOOD ADMINISTERED:none  DRAINS: right pleurix   LOCAL MEDICATIONS USED:  LIDOCAINE   SPECIMEN:  Source of Specimen:  right pleural fluid  DISPOSITION OF SPECIMEN:  PATHOLOGY  COUNTS:  YES   DICTATION: .Dragon Dictation  PLAN OF CARE: Discharge to home after PACU  PATIENT DISPOSITION:  PACU - hemodynamically stable.   Delay start of Pharmacological VTE agent (>24hrs) due to surgical blood loss or risk of bleeding: yes

## 2017-01-16 NOTE — Anesthesia Preprocedure Evaluation (Addendum)
Anesthesia Evaluation  Patient identified by MRN, date of birth, ID band Patient awake    Reviewed: Allergy & Precautions, NPO status , Patient's Chart, lab work & pertinent test results, reviewed documented beta blocker date and time   Airway Mallampati: II  TM Distance: <3 FB Neck ROM: Full    Dental no notable dental hx.    Pulmonary shortness of breath, former smoker,  Left pleural effusion, s/p catheter insertion     + decreased breath sounds      Cardiovascular hypertension, Pt. on home beta blockers + dysrhythmias  Rhythm:Regular Rate:Normal  ECG: SR, rate 83  Blood pressure demonstrated a hypertensive response to exercise. There was no ST segment deviation noted during stress.   1. Hypertensive BP response.  2. Submaximal stress study with no evidence for ischemia at the achieved workload.  3. No arrhythmias (flecainide evaluation).   Left ventricle: The cavity size was normal. Wall thickness was increased in a pattern of mild LVH. Systolic function was normal. The estimated ejection fraction was in the range of 60% to 65%.   Wall motion was normal; there were no regional wall motion abnormalities. Doppler parameters are consistent with abnormal left ventricular relaxation (grade 1 diastolic dysfunction). Doppler parameters are consistent with indeterminate ventricular filling pressure. Aortic valve: Functionally bicuspid with fusion of the right and non-coronary cusps; mildly thickened, mildly calcified leaflets. Transvalvular velocity was within the normal range. There was no stenosis. There was no regurgitation. Mitral valve: Moderately calcified annulus. Transvalvular velocity was within the normal range. There was no evidence for stenosis. There was no regurgitation. Left atrium: The atrium was moderately dilated. Right atrium: The atrium was moderately dilated. Tricuspid valve: There was trivial  regurgitation. Pericardium, extracardiac: A small pericardial effusion was identified at the apex, with an appearance consistent with hemopericardium. There was no evidence of hemodynamic compromise. There was a left pleural effusion.   Neuro/Psych Anxiety negative neurological ROS     GI/Hepatic Neg liver ROS, GERD  Medicated and Controlled,Ca of esophagus s/p esophagectomy    Endo/Other  negative endocrine ROS  Renal/GU negative Renal ROS     Musculoskeletal  (+) Arthritis ,   Abdominal   Peds  Hematology negative hematology ROS (+)   Anesthesia Other Findings   Reproductive/Obstetrics                            Anesthesia Physical  Anesthesia Plan  ASA: III  Anesthesia Plan: MAC   Post-op Pain Management:    Induction: Intravenous  PONV Risk Score and Plan: 1 and Propofol infusion and Treatment may vary due to age or medical condition  Airway Management Planned: Natural Airway  Additional Equipment:   Intra-op Plan:   Post-operative Plan:   Informed Consent: I have reviewed the patients History and Physical, chart, labs and discussed the procedure including the risks, benefits and alternatives for the proposed anesthesia with the patient or authorized representative who has indicated his/her understanding and acceptance.     Plan Discussed with: CRNA  Anesthesia Plan Comments:         Anesthesia Quick Evaluation

## 2017-01-16 NOTE — Discharge Planning (Signed)
EDCM has recommended that this patient have [facility] but pt declines at this time due to having had it in the past and his wife is trained to drain his catheters. I have discussed the benefits of home health services as wll as the risks of not having home health services with pt. [facility] informed patient that if he changed his mind, his primary care physician can order home health from office.The patient verbalizes understanding. No further EDCM needs identified at this time.

## 2017-01-16 NOTE — Anesthesia Procedure Notes (Signed)
Procedure Name: MAC Date/Time: 01/16/2017 7:38 AM Performed by: Trixie Deis A Pre-anesthesia Checklist: Patient identified, Emergency Drugs available, Suction available, Timeout performed and Patient being monitored Oxygen Delivery Method: Simple face mask Placement Confirmation: positive ETCO2 Dental Injury: Teeth and Oropharynx as per pre-operative assessment

## 2017-01-16 NOTE — Telephone Encounter (Signed)
Message from [place], [place] PT: PT eval complete. Recommend one visit per week x2 to establish a home exercise program to increase strength and endurance. Requesting orders to proceed with this plan of care. Discussed with Dr. Benay Spice, verbal order received and called to [facility]. He will fax order for signature.

## 2017-01-16 NOTE — Transfer of Care (Signed)
Immediate Anesthesia Transfer of Care Note  Patient: [place] Nickel  Procedure(s) Performed: Procedure(s): INSERTION PLEURAL DRAINAGE CATHETER (Right)  Patient Location: PACU  Anesthesia Type:MAC  Level of Consciousness: drowsy and patient cooperative  Airway & Oxygen Therapy: Patient Spontanous Breathing and Patient connected to face mask oxygen  Post-op Assessment: Report given to RN, Post -op Vital signs reviewed and stable and Patient moving all extremities  Post vital signs: Reviewed and stable  Last Vitals:  Vitals:   01/16/17 0602  BP: (!) 185/83  Pulse: 61  Resp: 18  Temp: 36.6 C  SpO2: 98%    Last Pain:  Vitals:   01/16/17 0602  TempSrc: Oral      Patients Stated Pain Goal: 3 (79/15/05 6979)  Complications: No apparent anesthesia complications

## 2017-01-16 NOTE — Progress Notes (Signed)
Report given to grace rn as caregiver 

## 2017-01-17 ENCOUNTER — Encounter (HOSPITAL_COMMUNITY): Payer: Self-pay | Admitting: Cardiothoracic Surgery

## 2017-01-17 DIAGNOSIS — M1990 Unspecified osteoarthritis, unspecified site: Secondary | ICD-10-CM | POA: Diagnosis not present

## 2017-01-17 DIAGNOSIS — K2270 Barrett's esophagus without dysplasia: Secondary | ICD-10-CM | POA: Diagnosis not present

## 2017-01-17 DIAGNOSIS — F419 Anxiety disorder, unspecified: Secondary | ICD-10-CM | POA: Diagnosis not present

## 2017-01-17 DIAGNOSIS — J910 Malignant pleural effusion: Secondary | ICD-10-CM | POA: Diagnosis not present

## 2017-01-17 DIAGNOSIS — I10 Essential (primary) hypertension: Secondary | ICD-10-CM | POA: Diagnosis not present

## 2017-01-17 DIAGNOSIS — C159 Malignant neoplasm of esophagus, unspecified: Secondary | ICD-10-CM | POA: Diagnosis not present

## 2017-01-17 NOTE — Op Note (Signed)
NAME:  David [name], David [name]                       ACCOUNT NO.:  MEDICAL RECORD NO.:  [id number]  LOCATION:                                 FACILITY:  PHYSICIAN:  Lanelle Bal, MD    DATE OF BIRTH:  25-Nov-1940  DATE OF PROCEDURE:  01/16/2017 DATE OF DISCHARGE:                              OPERATIVE REPORT   PREOPERATIVE DIAGNOSIS:  Recurrent right pleural effusion.  POSTOPERATIVE DIAGNOSIS:  Recurrent right pleural effusion.  SURGICAL PROCEDURE:  Placement of right PleurX catheter under fluoroscopic and ultrasound guidance.  SURGEON:  Lanelle Bal, MD.  BRIEF HISTORY:  The patient is a 76 year old male with a previous history of adenocarcinoma of the distal esophagus.  He has had increasing problems with recurrent pleural effusions and has had thoracentesis several times on the right.  Ten days ago, he had a left PleurX catheter placed and continues to drain this on a daily basis. Because of increased fluid on the right, a right PleurX was also recommended.  The patient agreed and signed informed consent.  DESCRIPTION OF PROCEDURE:  The patient with light intravenous sedation was placed in supine position with the right side elevated slightly. The right chest was prepped with Betadine and draped in the usual sterile manner.  Appropriate time-out was performed.  The right side had preoperatively been marked.  Ultrasound guidance confirmed the area of pleural fluid; 1% lidocaine was infiltrated along the posterior axillary line, approximately 7th intercostal space.  A 16-gauge needle was then used to place the guidewire into the right pleural space under fluoroscopic guidance.  A small counterincision was made more anteriorly and the PleurX catheter tunneled subpleurally over the guidewire.  A dilator was placed and then the peel-away sheath over the guidewire. The PleurX catheter was positioned into the right chest.  The guidewire, peel-away sheath, and stylet were all removed.   Fluoroscopy showed good position of the tube.  A liter of clear pleural fluid was removed.  The incision was closed with a 4-0 subcuticular stitch.  The catheter was secured in place with a nylon suture.  Dressings were applied.  The patient tolerated the procedure without complication.  Blood loss was none.  Sponge and needle count was reported as correct.  The patient was transferred to the recovery room for postoperative observation, having tolerated the procedure without complication.     Lanelle Bal, MD     EG/MEDQ  D:  01/17/2017  T:  01/17/2017  Job:  [id number]

## 2017-01-18 DIAGNOSIS — I10 Essential (primary) hypertension: Secondary | ICD-10-CM | POA: Diagnosis not present

## 2017-01-18 DIAGNOSIS — J910 Malignant pleural effusion: Secondary | ICD-10-CM | POA: Diagnosis not present

## 2017-01-18 DIAGNOSIS — K2270 Barrett's esophagus without dysplasia: Secondary | ICD-10-CM | POA: Diagnosis not present

## 2017-01-18 DIAGNOSIS — F419 Anxiety disorder, unspecified: Secondary | ICD-10-CM | POA: Diagnosis not present

## 2017-01-18 DIAGNOSIS — C159 Malignant neoplasm of esophagus, unspecified: Secondary | ICD-10-CM | POA: Diagnosis not present

## 2017-01-18 DIAGNOSIS — M1990 Unspecified osteoarthritis, unspecified site: Secondary | ICD-10-CM | POA: Diagnosis not present

## 2017-01-22 ENCOUNTER — Other Ambulatory Visit: Payer: Self-pay | Admitting: *Deleted

## 2017-01-22 DIAGNOSIS — F419 Anxiety disorder, unspecified: Secondary | ICD-10-CM | POA: Diagnosis not present

## 2017-01-22 DIAGNOSIS — I10 Essential (primary) hypertension: Secondary | ICD-10-CM | POA: Diagnosis not present

## 2017-01-22 DIAGNOSIS — K2270 Barrett's esophagus without dysplasia: Secondary | ICD-10-CM | POA: Diagnosis not present

## 2017-01-22 DIAGNOSIS — J910 Malignant pleural effusion: Secondary | ICD-10-CM | POA: Diagnosis not present

## 2017-01-22 DIAGNOSIS — C159 Malignant neoplasm of esophagus, unspecified: Secondary | ICD-10-CM | POA: Diagnosis not present

## 2017-01-22 DIAGNOSIS — M1990 Unspecified osteoarthritis, unspecified site: Secondary | ICD-10-CM | POA: Diagnosis not present

## 2017-01-23 ENCOUNTER — Telehealth: Payer: Self-pay | Admitting: Oncology

## 2017-01-23 ENCOUNTER — Other Ambulatory Visit: Payer: Self-pay | Admitting: *Deleted

## 2017-01-23 ENCOUNTER — Ambulatory Visit (HOSPITAL_BASED_OUTPATIENT_CLINIC_OR_DEPARTMENT_OTHER): Payer: Medicare Other

## 2017-01-23 ENCOUNTER — Other Ambulatory Visit (HOSPITAL_BASED_OUTPATIENT_CLINIC_OR_DEPARTMENT_OTHER): Payer: Medicare Other

## 2017-01-23 ENCOUNTER — Ambulatory Visit (HOSPITAL_BASED_OUTPATIENT_CLINIC_OR_DEPARTMENT_OTHER): Payer: Medicare Other | Admitting: Oncology

## 2017-01-23 VITALS — BP 127/69 | HR 64 | Temp 97.5°F | Resp 18 | Ht 75.0 in | Wt 180.8 lb

## 2017-01-23 DIAGNOSIS — C155 Malignant neoplasm of lower third of esophagus: Secondary | ICD-10-CM

## 2017-01-23 DIAGNOSIS — D709 Neutropenia, unspecified: Secondary | ICD-10-CM

## 2017-01-23 DIAGNOSIS — I10 Essential (primary) hypertension: Secondary | ICD-10-CM

## 2017-01-23 DIAGNOSIS — Z5111 Encounter for antineoplastic chemotherapy: Secondary | ICD-10-CM | POA: Diagnosis not present

## 2017-01-23 DIAGNOSIS — Z5112 Encounter for antineoplastic immunotherapy: Secondary | ICD-10-CM

## 2017-01-23 LAB — CBC WITH DIFFERENTIAL/PLATELET
BASO%: 2.4 % — AB (ref 0.0–2.0)
Basophils Absolute: 0 10*3/uL (ref 0.0–0.1)
EOS%: 8.8 % — AB (ref 0.0–7.0)
Eosinophils Absolute: 0.2 10*3/uL (ref 0.0–0.5)
HCT: 39 % (ref 38.4–49.9)
HGB: 12.9 g/dL — ABNORMAL LOW (ref 13.0–17.1)
LYMPH%: 21.3 % (ref 14.0–49.0)
MCH: 34.1 pg — ABNORMAL HIGH (ref 27.2–33.4)
MCHC: 33 g/dL (ref 32.0–36.0)
MCV: 103.4 fL — AB (ref 79.3–98.0)
MONO#: 0.3 10*3/uL (ref 0.1–0.9)
MONO%: 14.1 % — AB (ref 0.0–14.0)
NEUT%: 53.4 % (ref 39.0–75.0)
NEUTROS ABS: 1.1 10*3/uL — AB (ref 1.5–6.5)
Platelets: 165 10*3/uL (ref 140–400)
RBC: 3.77 10*6/uL — AB (ref 4.20–5.82)
RDW: 14.3 % (ref 11.0–14.6)
WBC: 2.1 10*3/uL — AB (ref 4.0–10.3)
lymph#: 0.4 10*3/uL — ABNORMAL LOW (ref 0.9–3.3)

## 2017-01-23 LAB — COMPREHENSIVE METABOLIC PANEL
ALT: 20 U/L (ref 0–55)
AST: 27 U/L (ref 5–34)
Albumin: 2.1 g/dL — ABNORMAL LOW (ref 3.5–5.0)
Alkaline Phosphatase: 86 U/L (ref 40–150)
Anion Gap: 6 mEq/L (ref 3–11)
BILIRUBIN TOTAL: 0.31 mg/dL (ref 0.20–1.20)
BUN: 28.2 mg/dL — AB (ref 7.0–26.0)
CHLORIDE: 109 meq/L (ref 98–109)
CO2: 31 meq/L — AB (ref 22–29)
CREATININE: 0.8 mg/dL (ref 0.7–1.3)
Calcium: 8.6 mg/dL (ref 8.4–10.4)
EGFR: 86 mL/min/{1.73_m2} — AB (ref >90)
Glucose: 114 mg/dl (ref 70–140)
Potassium: 4.1 mEq/L (ref 3.5–5.1)
SODIUM: 145 meq/L (ref 136–145)
TOTAL PROTEIN: 5.5 g/dL — AB (ref 6.4–8.3)

## 2017-01-23 MED ORDER — DIPHENHYDRAMINE HCL 50 MG/ML IJ SOLN
INTRAMUSCULAR | Status: AC
  Filled 2017-01-23: qty 1

## 2017-01-23 MED ORDER — PANTOPRAZOLE SODIUM 20 MG PO TBEC: 20.0000 mg | DELAYED_RELEASE_TABLET | Freq: Every day | ORAL | 1 refills | Status: DC

## 2017-01-23 MED ORDER — ACETAMINOPHEN 325 MG PO TABS
650.0000 mg | ORAL_TABLET | Freq: Once | ORAL | Status: AC
  Administered 2017-01-23: 650 mg via ORAL

## 2017-01-23 MED ORDER — DEXAMETHASONE SODIUM PHOSPHATE 10 MG/ML IJ SOLN
10.0000 mg | Freq: Once | INTRAMUSCULAR | Status: AC
  Administered 2017-01-23: 10 mg via INTRAVENOUS

## 2017-01-23 MED ORDER — DEXAMETHASONE SODIUM PHOSPHATE 10 MG/ML IJ SOLN
INTRAMUSCULAR | Status: AC
  Filled 2017-01-23: qty 1

## 2017-01-23 MED ORDER — SODIUM CHLORIDE 0.9% FLUSH
10.0000 mL | INTRAVENOUS | Status: DC | PRN
  Administered 2017-01-23 (×2): 10 mL
  Filled 2017-01-23: qty 10

## 2017-01-23 MED ORDER — ACETAMINOPHEN 325 MG PO TABS
ORAL_TABLET | ORAL | Status: AC
  Filled 2017-01-23: qty 2

## 2017-01-23 MED ORDER — SODIUM CHLORIDE 0.9 % IV SOLN
8.0000 mg/kg | Freq: Once | INTRAVENOUS | Status: AC
  Administered 2017-01-23: 600 mg via INTRAVENOUS
  Filled 2017-01-23: qty 50

## 2017-01-23 MED ORDER — HEPARIN SOD (PORK) LOCK FLUSH 100 UNIT/ML IV SOLN
500.0000 [IU] | Freq: Once | INTRAVENOUS | Status: AC | PRN
  Administered 2017-01-23: 500 [IU]
  Filled 2017-01-23: qty 5

## 2017-01-23 MED ORDER — PACLITAXEL CHEMO INJECTION 300 MG/50ML
45.0000 mg/m2 | Freq: Once | INTRAVENOUS | Status: AC
  Administered 2017-01-23: 90 mg via INTRAVENOUS
  Filled 2017-01-23: qty 15

## 2017-01-23 MED ORDER — DIPHENHYDRAMINE HCL 50 MG/ML IJ SOLN
25.0000 mg | Freq: Once | INTRAMUSCULAR | Status: AC
  Administered 2017-01-23: 25 mg via INTRAVENOUS

## 2017-01-23 MED ORDER — FAMOTIDINE IN NACL 20-0.9 MG/50ML-% IV SOLN
INTRAVENOUS | Status: AC
  Filled 2017-01-23: qty 50

## 2017-01-23 MED ORDER — SODIUM CHLORIDE 0.9 % IV SOLN
Freq: Once | INTRAVENOUS | Status: AC
  Administered 2017-01-23: 11:00:00 via INTRAVENOUS

## 2017-01-23 MED ORDER — FAMOTIDINE IN NACL 20-0.9 MG/50ML-% IV SOLN
20.0000 mg | Freq: Once | INTRAVENOUS | Status: AC
  Administered 2017-01-23: 20 mg via INTRAVENOUS

## 2017-01-23 NOTE — Patient Instructions (Addendum)
[facility] Discharge Instructions for Patients Receiving Chemotherapy  Today you received the following chemotherapy agents: paclitaxel (Taxol) and ramucirumab (Cyramza)   To help prevent nausea and vomiting after your treatment, we encourage you to take your nausea medication as directed.    If you develop nausea and vomiting that is not controlled by your nausea medication, call the clinic.   BELOW ARE SYMPTOMS THAT SHOULD BE REPORTED IMMEDIATELY:  *FEVER GREATER THAN 100.5 F  *CHILLS WITH OR WITHOUT FEVER  NAUSEA AND VOMITING THAT IS NOT CONTROLLED WITH YOUR NAUSEA MEDICATION  *UNUSUAL SHORTNESS OF BREATH  *UNUSUAL BRUISING OR BLEEDING  TENDERNESS IN MOUTH AND THROAT WITH OR WITHOUT PRESENCE OF ULCERS  *URINARY PROBLEMS  *BOWEL PROBLEMS  UNUSUAL RASH Items with * indicate a potential emergency and should be followed up as soon as possible.  Feel free to call the clinic you have any questions or concerns. The clinic phone number is [phone].  Please show the [place] at check-in to the Emergency Department and triage nurse.

## 2017-01-23 NOTE — Progress Notes (Signed)
Dr. Burr Medico okay to tx with ANC 1.1.

## 2017-01-23 NOTE — Progress Notes (Signed)
[facility] OFFICE PROGRESS NOTE   Diagnosis: Esophagus cancer  INTERVAL HISTORY:   David [name] returns as scheduled. He completed another treatment with Taxol/ramucirumab on 01/09/2017. He tolerated the treatment well. No neuropathy symptoms. No rash.  He underwent placement of a right Pleurx by Dr. Servando Snare on 01/16/2017. He is now draining the right and the left Pleurx catheter daily. He continues to have approximately [phone] mL of drainage from each tube.  No pain. He has intermittent dysphagia with certain foods. He was able to travel to [place] last week.  Objective:  Vital signs in last 24 hours:  Blood pressure 127/69, pulse 64, temperature (!) 97.5 F (36.4 C), temperature source Oral, resp. rate 18, height _0  (1.905 m), weight 180 lb 12.8 oz (82 kg), SpO2 99 %.    HEENT: No thrush or ulcers Resp: Bilateral rub, no respiratory distress, bilateral Pleuryx tubes in place Cardio: Regular rate and rhythm GI: No hepatomegaly, no mass, nontender Vascular: 1+ pitting edema of the legs bilaterally, pitting edema at the waistline    Portacath/PICC-without erythema  Lab Results:  Lab Results  Component Value Date   WBC 2.1 (L) 01/23/2017   HGB 12.9 (L) 01/23/2017   HCT 39.0 01/23/2017   MCV 103.4 (H) 01/23/2017   PLT 165 01/23/2017   NEUTROABS 1.1 (L) 01/23/2017    CMP     Component Value Date/Time   NA 145 01/23/2017 0906   K 4.1 01/23/2017 0906   CL 105 01/16/2017 0646   CO2 31 (H) 01/23/2017 0906   GLUCOSE 114 01/23/2017 0906   BUN 28.2 (H) 01/23/2017 0906   CREATININE 0.8 01/23/2017 0906   CALCIUM 8.6 01/23/2017 0906   PROT 5.5 (L) 01/23/2017 0906   ALBUMIN 2.1 (L) 01/23/2017 0906   AST 27 01/23/2017 0906   ALT 20 01/23/2017 0906   ALKPHOS 86 01/23/2017 0906   BILITOT 0.31 01/23/2017 0906   GFRNONAA >60 01/16/2017 0646   GFRAA >60 01/16/2017 0646    Lab Results  Component Value Date   CEA1 3.73 09/06/2016      Medications: I have reviewed the patient's current medications.  Assessment/Plan: 1. Adenocarcinoma of the distal esophagus,uT3uN2  Presenting with a food impaction and dysphagia 08/20/2014  Staging CT scans of the chest, abdomen, and pelvis 10/06/2014 with indeterminate pulmonary nodules, a 9 mm paraesophageal lymph node, and circumferential thickening of the distal esophagus  PET scan 10/23/2014 confirmed hypermetabolic thickening at the distal esophagus with small paraesophageal nodes having faint nonspecific activity. A pleural-based focus of consolidation at the posterior right lower lobe had low metabolic activity-favored to be a benign process.  Initiation of concurrent weekly Taxol/carboplatin and radiation on 11/02/2014, radiation completed 12/09/2014  CTs 12/31/2014 revealed distal esophageal wall thickening with adjacent small lymph nodes suspicious for metastases, stable lung nodule posterior to the right bronchus intermedius and the persistent 5 mm nodule in the right lower lobe  Esophagectomy 02/01/2015 with the pathology confirming a ypT3,ypN3 tumor, positive distal margin  MSI-stable, tumor mutation burden-5  CT chest 07/17/2016-bilateral pleural effusions, Bibasilar airspace opacities with peribronchial vascular nodularity, new low-density right liver lesion  Left thoracentesis 07/19/2016-cytology showedreactive mesothelial cells; mixed lymphoid population  Ultrasound-guided biopsy of a segment 8 liver lesion on 08/01/2016-adenocarcinoma  Cycle 1 FOLFOX 09/06/2016  Cycle 2 FOLFOX 09/21/2016  Cycle 3 FOLFOX 10/05/2016  Cycle 4 FOLFOX 11/02/2016  Cycle 5 FOLFOX 11/20/2016  CT chest 12/01/2016-increased size of right liver lesion, persistent bilateral pleural effusions  Taxol and  ramcirumab 12/12/2016; day 8 Taxol 12/19/2016   Cycle 2 Taxol and ramcirumab 01/09/2017, day 8 Taxol discontinued  Cycle 3 Taxol/ ramucirumab 01/23/2017, Taxol dose  reduced  2. Prostatic hypertrophy-Status post TUR 01/26/2016  3. Hypertension  4. History of multiple basal cell skin cancers  5. History of thrombocytopenia secondary to chemotherapy  6. PVCs/bicuspid aortic valve-followed by cardiology  7. Port-A-Cath placement 09/06/2016  8. Neutropenia secondary to chemotherapy  9. Bilateral pleural effusions, status post right thoracentesis 11/10/2016, left thoracentesis 11/15/2016  Cytology from 11/15/2016-negative  Cytology from 12/04/2016,12/06/2016, and 12/19/2016-negative  Placement of a left Pleurx catheter 01/03/2017  Placement of a right Pleurx catheter 01/16/2017   Disposition:  David [name] has an improved performance status. He will continue draining the bilateral Pleuryx catheters as directed by Dr. Servando Snare. I continue to feel it is very likely the pleural effusions are related to malignancy despite the-negative pleural fluid cytologies.  He will continue Taxol/ramucirumab chemotherapy every 2 weeks. We will plan for a restaging CT in November.  He has mild neutropenia today. He will contact us for a fever or other symptoms of infection. David [name] had bone pain with Neulasta in the past and does not wish to receive Neulasta. We will dose reduce the Taxol.  David [name] will return for an office visit and chemotherapy in 2 weeks.  25 minutes were spent with the patient today. The majority of the time was used for counseling and coordination of care.    Donneta Romberg, MD  01/23/2017  9:55 AM

## 2017-01-23 NOTE — Telephone Encounter (Signed)
Spoke with patient re appointments for 10/16 and 10/29.

## 2017-01-24 DIAGNOSIS — I10 Essential (primary) hypertension: Secondary | ICD-10-CM | POA: Diagnosis not present

## 2017-01-24 DIAGNOSIS — M1990 Unspecified osteoarthritis, unspecified site: Secondary | ICD-10-CM | POA: Diagnosis not present

## 2017-01-24 DIAGNOSIS — C159 Malignant neoplasm of esophagus, unspecified: Secondary | ICD-10-CM | POA: Diagnosis not present

## 2017-01-24 DIAGNOSIS — J910 Malignant pleural effusion: Secondary | ICD-10-CM | POA: Diagnosis not present

## 2017-01-24 DIAGNOSIS — F419 Anxiety disorder, unspecified: Secondary | ICD-10-CM | POA: Diagnosis not present

## 2017-01-24 DIAGNOSIS — K2270 Barrett's esophagus without dysplasia: Secondary | ICD-10-CM | POA: Diagnosis not present

## 2017-01-29 ENCOUNTER — Telehealth: Payer: Self-pay

## 2017-01-29 DIAGNOSIS — M1990 Unspecified osteoarthritis, unspecified site: Secondary | ICD-10-CM | POA: Diagnosis not present

## 2017-01-29 DIAGNOSIS — F419 Anxiety disorder, unspecified: Secondary | ICD-10-CM | POA: Diagnosis not present

## 2017-01-29 DIAGNOSIS — K2270 Barrett's esophagus without dysplasia: Secondary | ICD-10-CM | POA: Diagnosis not present

## 2017-01-29 DIAGNOSIS — J910 Malignant pleural effusion: Secondary | ICD-10-CM | POA: Diagnosis not present

## 2017-01-29 DIAGNOSIS — I10 Essential (primary) hypertension: Secondary | ICD-10-CM | POA: Diagnosis not present

## 2017-01-29 DIAGNOSIS — C159 Malignant neoplasm of esophagus, unspecified: Secondary | ICD-10-CM | POA: Diagnosis not present

## 2017-01-29 NOTE — Telephone Encounter (Signed)
Call placed to patient that per MD Benay Spice, it is ok to take a stool softener. Pt verbalizes understanding and agrees with plan of care.

## 2017-02-05 DIAGNOSIS — M1990 Unspecified osteoarthritis, unspecified site: Secondary | ICD-10-CM | POA: Diagnosis not present

## 2017-02-05 DIAGNOSIS — C159 Malignant neoplasm of esophagus, unspecified: Secondary | ICD-10-CM | POA: Diagnosis not present

## 2017-02-05 DIAGNOSIS — F419 Anxiety disorder, unspecified: Secondary | ICD-10-CM | POA: Diagnosis not present

## 2017-02-05 DIAGNOSIS — K2270 Barrett's esophagus without dysplasia: Secondary | ICD-10-CM | POA: Diagnosis not present

## 2017-02-05 DIAGNOSIS — J910 Malignant pleural effusion: Secondary | ICD-10-CM | POA: Diagnosis not present

## 2017-02-05 DIAGNOSIS — I10 Essential (primary) hypertension: Secondary | ICD-10-CM | POA: Diagnosis not present

## 2017-02-06 ENCOUNTER — Ambulatory Visit (HOSPITAL_BASED_OUTPATIENT_CLINIC_OR_DEPARTMENT_OTHER): Payer: Medicare Other

## 2017-02-06 ENCOUNTER — Other Ambulatory Visit: Payer: Self-pay | Admitting: Oncology

## 2017-02-06 ENCOUNTER — Other Ambulatory Visit (HOSPITAL_BASED_OUTPATIENT_CLINIC_OR_DEPARTMENT_OTHER): Payer: Medicare Other

## 2017-02-06 ENCOUNTER — Ambulatory Visit (HOSPITAL_BASED_OUTPATIENT_CLINIC_OR_DEPARTMENT_OTHER): Payer: Medicare Other | Admitting: Nurse Practitioner

## 2017-02-06 ENCOUNTER — Ambulatory Visit: Payer: Medicare Other

## 2017-02-06 VITALS — BP 159/84 | HR 79

## 2017-02-06 VITALS — BP 125/69 | HR 68 | Temp 97.7°F | Resp 18 | Ht 75.0 in | Wt 183.0 lb

## 2017-02-06 DIAGNOSIS — C155 Malignant neoplasm of lower third of esophagus: Secondary | ICD-10-CM

## 2017-02-06 DIAGNOSIS — Z5112 Encounter for antineoplastic immunotherapy: Secondary | ICD-10-CM

## 2017-02-06 DIAGNOSIS — D701 Agranulocytosis secondary to cancer chemotherapy: Secondary | ICD-10-CM | POA: Diagnosis not present

## 2017-02-06 DIAGNOSIS — J90 Pleural effusion, not elsewhere classified: Secondary | ICD-10-CM

## 2017-02-06 DIAGNOSIS — Z5111 Encounter for antineoplastic chemotherapy: Secondary | ICD-10-CM | POA: Diagnosis not present

## 2017-02-06 DIAGNOSIS — Z95828 Presence of other vascular implants and grafts: Secondary | ICD-10-CM

## 2017-02-06 DIAGNOSIS — Z79899 Other long term (current) drug therapy: Secondary | ICD-10-CM

## 2017-02-06 LAB — CBC WITH DIFFERENTIAL/PLATELET
BASO%: 1.3 % (ref 0.0–2.0)
BASOS ABS: 0 10*3/uL (ref 0.0–0.1)
EOS ABS: 0.1 10*3/uL (ref 0.0–0.5)
EOS%: 3 % (ref 0.0–7.0)
HCT: 39.7 % (ref 38.4–49.9)
HGB: 12.8 g/dL — ABNORMAL LOW (ref 13.0–17.1)
LYMPH%: 15.9 % (ref 14.0–49.0)
MCH: 33.9 pg — AB (ref 27.2–33.4)
MCHC: 32.2 g/dL (ref 32.0–36.0)
MCV: 105 fL — AB (ref 79.3–98.0)
MONO#: 0.3 10*3/uL (ref 0.1–0.9)
MONO%: 11.3 % (ref 0.0–14.0)
NEUT#: 2.1 10*3/uL (ref 1.5–6.5)
NEUT%: 68.5 % (ref 39.0–75.0)
PLATELETS: 132 10*3/uL — AB (ref 140–400)
RBC: 3.78 10*6/uL — AB (ref 4.20–5.82)
RDW: 13.9 % (ref 11.0–14.6)
WBC: 3 10*3/uL — ABNORMAL LOW (ref 4.0–10.3)
lymph#: 0.5 10*3/uL — ABNORMAL LOW (ref 0.9–3.3)

## 2017-02-06 LAB — COMPREHENSIVE METABOLIC PANEL
ALBUMIN: 2 g/dL — AB (ref 3.5–5.0)
ALK PHOS: 80 U/L (ref 40–150)
ALT: 29 U/L (ref 0–55)
ANION GAP: 7 meq/L (ref 3–11)
AST: 35 U/L — ABNORMAL HIGH (ref 5–34)
BILIRUBIN TOTAL: 0.42 mg/dL (ref 0.20–1.20)
BUN: 31.3 mg/dL — ABNORMAL HIGH (ref 7.0–26.0)
CO2: 26 meq/L (ref 22–29)
Calcium: 8.4 mg/dL (ref 8.4–10.4)
Chloride: 112 mEq/L — ABNORMAL HIGH (ref 98–109)
Creatinine: 0.8 mg/dL (ref 0.7–1.3)
GLUCOSE: 98 mg/dL (ref 70–140)
POTASSIUM: 3.9 meq/L (ref 3.5–5.1)
SODIUM: 145 meq/L (ref 136–145)
Total Protein: 5 g/dL — ABNORMAL LOW (ref 6.4–8.3)

## 2017-02-06 LAB — UA PROTEIN, DIPSTICK - CHCC: Protein, ur: <30 mg/dL

## 2017-02-06 MED ORDER — SODIUM CHLORIDE 0.9% FLUSH
10.0000 mL | INTRAVENOUS | Status: DC | PRN
  Administered 2017-02-06: 10 mL
  Filled 2017-02-06: qty 10

## 2017-02-06 MED ORDER — PACLITAXEL CHEMO INJECTION 300 MG/50ML
45.0000 mg/m2 | Freq: Once | INTRAVENOUS | Status: AC
  Administered 2017-02-06: 90 mg via INTRAVENOUS
  Filled 2017-02-06: qty 15

## 2017-02-06 MED ORDER — SODIUM CHLORIDE 0.9% FLUSH
10.0000 mL | Freq: Once | INTRAVENOUS | Status: AC
  Administered 2017-02-06: 10 mL
  Filled 2017-02-06: qty 10

## 2017-02-06 MED ORDER — DEXAMETHASONE SODIUM PHOSPHATE 10 MG/ML IJ SOLN
INTRAMUSCULAR | Status: AC
Start: 2017-02-06 — End: 2017-02-06
  Filled 2017-02-06: qty 1

## 2017-02-06 MED ORDER — DIPHENHYDRAMINE HCL 50 MG/ML IJ SOLN
INTRAMUSCULAR | Status: AC
  Filled 2017-02-06: qty 1

## 2017-02-06 MED ORDER — ACETAMINOPHEN 325 MG PO TABS
650.0000 mg | ORAL_TABLET | Freq: Once | ORAL | Status: AC
  Administered 2017-02-06: 650 mg via ORAL

## 2017-02-06 MED ORDER — ACETAMINOPHEN 325 MG PO TABS
ORAL_TABLET | ORAL | Status: AC
  Filled 2017-02-06: qty 2

## 2017-02-06 MED ORDER — HEPARIN SOD (PORK) LOCK FLUSH 100 UNIT/ML IV SOLN
500.0000 [IU] | Freq: Once | INTRAVENOUS | Status: AC | PRN
  Administered 2017-02-06: 500 [IU]
  Filled 2017-02-06: qty 5

## 2017-02-06 MED ORDER — SODIUM CHLORIDE 0.9 % IV SOLN
Freq: Once | INTRAVENOUS | Status: AC
  Administered 2017-02-06: 11:00:00 via INTRAVENOUS

## 2017-02-06 MED ORDER — DIPHENHYDRAMINE HCL 50 MG/ML IJ SOLN
25.0000 mg | Freq: Once | INTRAMUSCULAR | Status: AC
Start: 2017-02-06 — End: 2017-02-06
  Administered 2017-02-06: 25 mg via INTRAVENOUS

## 2017-02-06 MED ORDER — FAMOTIDINE IN NACL 20-0.9 MG/50ML-% IV SOLN
INTRAVENOUS | Status: AC
  Filled 2017-02-06: qty 50

## 2017-02-06 MED ORDER — SODIUM CHLORIDE 0.9 % IV SOLN
8.0000 mg/kg | Freq: Once | INTRAVENOUS | Status: AC
  Administered 2017-02-06: 600 mg via INTRAVENOUS
  Filled 2017-02-06: qty 50

## 2017-02-06 MED ORDER — DEXAMETHASONE SODIUM PHOSPHATE 10 MG/ML IJ SOLN
10.0000 mg | Freq: Once | INTRAMUSCULAR | Status: AC
  Administered 2017-02-06: 10 mg via INTRAVENOUS

## 2017-02-06 MED ORDER — FAMOTIDINE IN NACL 20-0.9 MG/50ML-% IV SOLN
20.0000 mg | Freq: Once | INTRAVENOUS | Status: AC
  Administered 2017-02-06: 20 mg via INTRAVENOUS

## 2017-02-06 NOTE — Patient Instructions (Signed)
Implanted Port Home Guide An implanted port is a type of central line that is placed under the skin. Central lines are used to provide IV access when treatment or nutrition needs to be given through a person's veins. Implanted ports are used for long-term IV access. An implanted port may be placed because:  You need IV medicine that would be irritating to the small veins in your hands or arms.  You need long-term IV medicines, such as antibiotics.  You need IV nutrition for a long period.  You need frequent blood draws for lab tests.  You need dialysis.  Implanted ports are usually placed in the chest area, but they can also be placed in the upper arm, the abdomen, or the leg. An implanted port has two main parts:  Reservoir. The reservoir is round and will appear as a small, raised area under your skin. The reservoir is the part where a needle is inserted to give medicines or draw blood.  Catheter. The catheter is a thin, flexible tube that extends from the reservoir. The catheter is placed into a large vein. Medicine that is inserted into the reservoir goes into the catheter and then into the vein.  How will I care for my incision site? Do not get the incision site wet. Bathe or shower as directed by your health care provider. How is my port accessed? Special steps must be taken to access the port:  Before the port is accessed, a numbing cream can be placed on the skin. This helps numb the skin over the port site.  Your health care provider uses a sterile technique to access the port. ? Your health care provider must put on a mask and sterile gloves. ? The skin over your port is cleaned carefully with an antiseptic and allowed to dry. ? The port is gently pinched between sterile gloves, and a needle is inserted into the port.  Only "non-coring" port needles should be used to access the port. Once the port is accessed, a blood return should be checked. This helps ensure that the port  is in the vein and is not clogged.  If your port needs to remain accessed for a constant infusion, a clear (transparent) bandage will be placed over the needle site. The bandage and needle will need to be changed every week, or as directed by your health care provider.  Keep the bandage covering the needle clean and dry. Do not get it wet. Follow your health care provider's instructions on how to take a shower or bath while the port is accessed.  If your port does not need to stay accessed, no bandage is needed over the port.  What is flushing? Flushing helps keep the port from getting clogged. Follow your health care provider's instructions on how and when to flush the port. Ports are usually flushed with saline solution or a medicine called heparin. The need for flushing will depend on how the port is used.  If the port is used for intermittent medicines or blood draws, the port will need to be flushed: ? After medicines have been given. ? After blood has been drawn. ? As part of routine maintenance.  If a constant infusion is running, the port may not need to be flushed.  How long will my port stay implanted? The port can stay in for as long as your health care provider thinks it is needed. When it is time for the port to come out, surgery will be   done to remove it. The procedure is similar to the one performed when the port was put in. When should I seek immediate medical care? When you have an implanted port, you should seek immediate medical care if:  You notice a bad smell coming from the incision site.  You have swelling, redness, or drainage at the incision site.  You have more swelling or pain at the port site or the surrounding area.  You have a fever that is not controlled with medicine.  This information is not intended to replace advice given to you by your health care provider. Make sure you discuss any questions you have with your health care provider. Document  Released: 04/10/2005 Document Revised: 09/16/2015 Document Reviewed: 12/16/2012 Elsevier Interactive Patient Education  2017 [employer]  

## 2017-02-06 NOTE — Patient Instructions (Addendum)
[facility] Discharge Instructions for Patients Receiving Chemotherapy  Today you received the following chemotherapy agents Taxol and Cyramza  To help prevent nausea and vomiting after your treatment, we encourage you to take your nausea medication as directed.    If you develop nausea and vomiting that is not controlled by your nausea medication, call the clinic.   BELOW ARE SYMPTOMS THAT SHOULD BE REPORTED IMMEDIATELY:  *FEVER GREATER THAN 100.5 F  *CHILLS WITH OR WITHOUT FEVER  NAUSEA AND VOMITING THAT IS NOT CONTROLLED WITH YOUR NAUSEA MEDICATION  *UNUSUAL SHORTNESS OF BREATH  *UNUSUAL BRUISING OR BLEEDING  TENDERNESS IN MOUTH AND THROAT WITH OR WITHOUT PRESENCE OF ULCERS  *URINARY PROBLEMS  *BOWEL PROBLEMS  UNUSUAL RASH Items with * indicate a potential emergency and should be followed up as soon as possible.  Feel free to call the clinic should you have any questions or concerns. The clinic phone number is [phone].  Please show the CHEMO ALERT CARD at check-in to the Emergency Department and triage nurse.   

## 2017-02-06 NOTE — Progress Notes (Signed)
[facility] OFFICE PROGRESS NOTE   Diagnosis:  Esophagus cancer  INTERVAL HISTORY:   David [name] returns as scheduled. He completed another cycle of Taxol/ramucirumab 01/23/2017. He denies nausea/vomiting. No mouth sores. No consistent diarrhea. When he does have loose stools it tends to be diet related. No rash. No numbness or tingling in his hands or feet. He is draining the Pleurx catheters daily. He notes the right catheter tends to have more output than the left. He continues to have a good appetite.  Objective:  Vital signs in last 24 hours:  Blood pressure 125/69, pulse 68, temperature 97.7 F (36.5 C), temperature source Oral, resp. rate 18, height _0  (1.905 m), weight 183 lb (83 kg), SpO2 98 %.    HEENT: no thrush or ulcers. Resp: lungs clear bilaterally. Bilateral Pleurx catheters. Cardio: regular rate and rhythm. Faint systolic murmur. GI: no hepatomegaly. Vascular: pitting edema at the lower legs bilaterally. Port-A-Cath without erythema.  Lab Results:  Lab Results  Component Value Date   WBC 3.0 (L) 02/06/2017   HGB 12.8 (L) 02/06/2017   HCT 39.7 02/06/2017   MCV 105.0 (H) 02/06/2017   PLT 132 (L) 02/06/2017   NEUTROABS 2.1 02/06/2017    Imaging:  No results found.  Medications: I have reviewed the patient's current medications.  Assessment/Plan: 1. Adenocarcinoma of the distal esophagus,uT3uN2  Presenting with a food impaction and dysphagia 08/20/2014  Staging CT scans of the chest, abdomen, and pelvis 10/06/2014 with indeterminate pulmonary nodules, a 9 mm paraesophageal lymph node, and circumferential thickening of the distal esophagus  PET scan 10/23/2014 confirmed hypermetabolic thickening at the distal esophagus with small paraesophageal nodes having faint nonspecific activity. A pleural-based focus of consolidation at the posterior right lower lobe had low metabolic activity-favored to be a benign process.  Initiation of  concurrent weekly Taxol/carboplatin and radiation on 11/02/2014, radiation completed 12/09/2014  CTs 12/31/2014 revealed distal esophageal wall thickening with adjacent small lymph nodes suspicious for metastases, stable lung nodule posterior to the right bronchus intermedius and the persistent 5 mm nodule in the right lower lobe  Esophagectomy 02/01/2015 with the pathology confirming a ypT3,ypN3 tumor, positive distal margin  MSI-stable, tumor mutation burden-5  CT chest 07/17/2016-bilateral pleural effusions, Bibasilar airspace opacities with peribronchial vascular nodularity, new low-density right liver lesion  Left thoracentesis 07/19/2016-cytology showedreactive mesothelial cells; mixed lymphoid population  Ultrasound-guided biopsy of a segment 8 liver lesion on 08/01/2016-adenocarcinoma  Cycle 1 FOLFOX 09/06/2016  Cycle 2 FOLFOX 09/21/2016  Cycle 3 FOLFOX 10/05/2016  Cycle 4 FOLFOX 11/02/2016  Cycle 5 FOLFOX 11/20/2016  CT chest 12/01/2016-increased size of right liver lesion, persistent bilateral pleural effusions  Taxol and ramcirumab 12/12/2016; day 8 Taxol 12/19/2016   Cycle 2 Taxol and ramcirumab 01/09/2017, day 8 Taxol discontinued  Cycle 3 Taxol/ ramucirumab 01/23/2017, Taxol dose reduced  Cycle 4 Taxol/ramucirumab 02/06/2017  2. Prostatic hypertrophy-Status post TUR 01/26/2016  3. Hypertension  4. History of multiple basal cell skin cancers  5. History of thrombocytopenia secondary to chemotherapy  6. PVCs/bicuspid aortic valve-followed by cardiology  7. Port-A-Cath placement 09/06/2016  8. Neutropenia secondary to chemotherapy  9. Bilateral pleural effusions, status post right thoracentesis 11/10/2016, left thoracentesis 11/15/2016  Cytology from 11/15/2016-negative  Cytology from 12/04/2016,12/06/2016, and 12/19/2016-negative  Placement of a left Pleurx catheter 01/03/2017  Placement of a right Pleurx catheter  01/16/2017    Disposition:David [name] appears stable. The plan is to continue Taxol/ramucirumab every 2 weeks. Restaging CT evaluation in November. He will return for  a follow-up visit and treatment in 2 weeks. He will contact the office in the interim with any problems.  Plan reviewed with Dr. Benay Spice.      Ned Card ANP/GNP-BC   02/06/2017  10:32 AM

## 2017-02-06 NOTE — Progress Notes (Signed)
Okay to proceed without urine protein results, pt to obtain urine protein prior to discharge Per Dr. Benay Spice. Pt aware and verbalizes understanding.

## 2017-02-12 DIAGNOSIS — F419 Anxiety disorder, unspecified: Secondary | ICD-10-CM | POA: Diagnosis not present

## 2017-02-12 DIAGNOSIS — I10 Essential (primary) hypertension: Secondary | ICD-10-CM | POA: Diagnosis not present

## 2017-02-12 DIAGNOSIS — C159 Malignant neoplasm of esophagus, unspecified: Secondary | ICD-10-CM | POA: Diagnosis not present

## 2017-02-12 DIAGNOSIS — M1990 Unspecified osteoarthritis, unspecified site: Secondary | ICD-10-CM | POA: Diagnosis not present

## 2017-02-12 DIAGNOSIS — K2270 Barrett's esophagus without dysplasia: Secondary | ICD-10-CM | POA: Diagnosis not present

## 2017-02-12 DIAGNOSIS — J910 Malignant pleural effusion: Secondary | ICD-10-CM | POA: Diagnosis not present

## 2017-02-18 ENCOUNTER — Encounter (HOSPITAL_COMMUNITY): Payer: Self-pay | Admitting: Cardiothoracic Surgery

## 2017-02-19 ENCOUNTER — Ambulatory Visit (HOSPITAL_BASED_OUTPATIENT_CLINIC_OR_DEPARTMENT_OTHER): Payer: Medicare Other | Admitting: Nurse Practitioner

## 2017-02-19 ENCOUNTER — Telehealth: Payer: Self-pay | Admitting: Nurse Practitioner

## 2017-02-19 ENCOUNTER — Ambulatory Visit (HOSPITAL_BASED_OUTPATIENT_CLINIC_OR_DEPARTMENT_OTHER): Payer: Medicare Other

## 2017-02-19 ENCOUNTER — Ambulatory Visit: Payer: Medicare Other

## 2017-02-19 ENCOUNTER — Other Ambulatory Visit (HOSPITAL_BASED_OUTPATIENT_CLINIC_OR_DEPARTMENT_OTHER): Payer: Medicare Other

## 2017-02-19 VITALS — BP 154/65

## 2017-02-19 VITALS — BP 134/70 | HR 51 | Temp 97.7°F | Resp 18 | Ht 75.0 in | Wt 182.0 lb

## 2017-02-19 DIAGNOSIS — Z95828 Presence of other vascular implants and grafts: Secondary | ICD-10-CM

## 2017-02-19 DIAGNOSIS — Z5111 Encounter for antineoplastic chemotherapy: Secondary | ICD-10-CM

## 2017-02-19 DIAGNOSIS — Z79899 Other long term (current) drug therapy: Secondary | ICD-10-CM

## 2017-02-19 DIAGNOSIS — C155 Malignant neoplasm of lower third of esophagus: Secondary | ICD-10-CM

## 2017-02-19 DIAGNOSIS — Z5112 Encounter for antineoplastic immunotherapy: Secondary | ICD-10-CM | POA: Diagnosis not present

## 2017-02-19 LAB — CBC WITH DIFFERENTIAL/PLATELET
BASO%: 0.8 % (ref 0.0–2.0)
BASOS ABS: 0 10*3/uL (ref 0.0–0.1)
EOS ABS: 0.1 10*3/uL (ref 0.0–0.5)
EOS%: 2.7 % (ref 0.0–7.0)
HCT: 38.2 % — ABNORMAL LOW (ref 38.4–49.9)
HEMOGLOBIN: 12.2 g/dL — AB (ref 13.0–17.1)
LYMPH%: 18 % (ref 14.0–49.0)
MCH: 33.3 pg (ref 27.2–33.4)
MCHC: 31.9 g/dL — ABNORMAL LOW (ref 32.0–36.0)
MCV: 104.4 fL — AB (ref 79.3–98.0)
MONO#: 0.3 10*3/uL (ref 0.1–0.9)
MONO%: 10.6 % (ref 0.0–14.0)
NEUT#: 1.7 10*3/uL (ref 1.5–6.5)
NEUT%: 67.9 % (ref 39.0–75.0)
Platelets: 135 10*3/uL — ABNORMAL LOW (ref 140–400)
RBC: 3.66 10*6/uL — ABNORMAL LOW (ref 4.20–5.82)
RDW: 14 % (ref 11.0–14.6)
WBC: 2.6 10*3/uL — ABNORMAL LOW (ref 4.0–10.3)
lymph#: 0.5 10*3/uL — ABNORMAL LOW (ref 0.9–3.3)

## 2017-02-19 LAB — COMPREHENSIVE METABOLIC PANEL
ALBUMIN: 1.9 g/dL — AB (ref 3.5–5.0)
ALK PHOS: 85 U/L (ref 40–150)
ALT: 25 U/L (ref 0–55)
ANION GAP: 5 meq/L (ref 3–11)
AST: 28 U/L (ref 5–34)
BUN: 26.3 mg/dL — ABNORMAL HIGH (ref 7.0–26.0)
CO2: 27 mEq/L (ref 22–29)
Calcium: 8.1 mg/dL — ABNORMAL LOW (ref 8.4–10.4)
Chloride: 111 mEq/L — ABNORMAL HIGH (ref 98–109)
Creatinine: 0.8 mg/dL (ref 0.7–1.3)
GLUCOSE: 91 mg/dL (ref 70–140)
POTASSIUM: 4 meq/L (ref 3.5–5.1)
SODIUM: 143 meq/L (ref 136–145)
Total Bilirubin: 0.34 mg/dL (ref 0.20–1.20)
Total Protein: 4.8 g/dL — ABNORMAL LOW (ref 6.4–8.3)

## 2017-02-19 LAB — UA PROTEIN, DIPSTICK - CHCC: Protein, ur: 30 mg/dL

## 2017-02-19 MED ORDER — HEPARIN SOD (PORK) LOCK FLUSH 100 UNIT/ML IV SOLN
500.0000 [IU] | Freq: Once | INTRAVENOUS | Status: AC | PRN
  Administered 2017-02-19: 500 [IU]
  Filled 2017-02-19: qty 5

## 2017-02-19 MED ORDER — ACETAMINOPHEN 325 MG PO TABS
ORAL_TABLET | ORAL | Status: AC
  Filled 2017-02-19: qty 2

## 2017-02-19 MED ORDER — FAMOTIDINE IN NACL 20-0.9 MG/50ML-% IV SOLN
20.0000 mg | Freq: Once | INTRAVENOUS | Status: AC
  Administered 2017-02-19: 20 mg via INTRAVENOUS

## 2017-02-19 MED ORDER — ACETAMINOPHEN 325 MG PO TABS
650.0000 mg | ORAL_TABLET | Freq: Once | ORAL | Status: AC
  Administered 2017-02-19: 650 mg via ORAL

## 2017-02-19 MED ORDER — SODIUM CHLORIDE 0.9% FLUSH
10.0000 mL | Freq: Once | INTRAVENOUS | Status: AC
  Administered 2017-02-19: 10 mL
  Filled 2017-02-19: qty 10

## 2017-02-19 MED ORDER — SODIUM CHLORIDE 0.9 % IV SOLN
8.0000 mg/kg | Freq: Once | INTRAVENOUS | Status: AC
  Administered 2017-02-19: 600 mg via INTRAVENOUS
  Filled 2017-02-19: qty 50

## 2017-02-19 MED ORDER — DIPHENHYDRAMINE HCL 50 MG/ML IJ SOLN
INTRAMUSCULAR | Status: AC
  Filled 2017-02-19: qty 1

## 2017-02-19 MED ORDER — SODIUM CHLORIDE 0.9% FLUSH
10.0000 mL | INTRAVENOUS | Status: DC | PRN
  Administered 2017-02-19: 10 mL
  Filled 2017-02-19: qty 10

## 2017-02-19 MED ORDER — SODIUM CHLORIDE 0.9 % IV SOLN
Freq: Once | INTRAVENOUS | Status: AC
  Administered 2017-02-19: 13:00:00 via INTRAVENOUS

## 2017-02-19 MED ORDER — DEXAMETHASONE SODIUM PHOSPHATE 10 MG/ML IJ SOLN
10.0000 mg | Freq: Once | INTRAMUSCULAR | Status: AC
  Administered 2017-02-19: 10 mg via INTRAVENOUS

## 2017-02-19 MED ORDER — FAMOTIDINE IN NACL 20-0.9 MG/50ML-% IV SOLN
INTRAVENOUS | Status: AC
  Filled 2017-02-19: qty 50

## 2017-02-19 MED ORDER — DEXAMETHASONE SODIUM PHOSPHATE 10 MG/ML IJ SOLN
INTRAMUSCULAR | Status: AC
  Filled 2017-02-19: qty 1

## 2017-02-19 MED ORDER — SODIUM CHLORIDE 0.9 % IV SOLN
45.0000 mg/m2 | Freq: Once | INTRAVENOUS | Status: AC
  Administered 2017-02-19: 90 mg via INTRAVENOUS
  Filled 2017-02-19: qty 15

## 2017-02-19 MED ORDER — DIPHENHYDRAMINE HCL 50 MG/ML IJ SOLN
25.0000 mg | Freq: Once | INTRAMUSCULAR | Status: AC
  Administered 2017-02-19: 25 mg via INTRAVENOUS

## 2017-02-19 NOTE — Telephone Encounter (Signed)
Scheduled appt per 10/29 los - Gave patient AVS and calender per los.  

## 2017-02-19 NOTE — Progress Notes (Signed)
[facility] OFFICE PROGRESS NOTE   Diagnosis:  Esophagus cancer  INTERVAL HISTORY:   David [name] returns as scheduled. He completed another cycle of Taxol/ramcirumab 02/06/2017. He denies nausea/vomiting. No mouth sores. No diarrhea. Dyspnea continues to be improved. He drains the Pleurx catheters daily. He notes less drainage from the left side compared with the right. He has pain on the right side following drainage. He reports that his fingers feel "cold". No numbness. This does not interfere with activity. Appetite and energy level continue to be improved. He continues to note leg swelling.  Objective:  Vital signs in last 24 hours:  Blood pressure 134/70, pulse (!) 51, temperature 97.7 F (36.5 C), temperature source Oral, resp. rate 18, height '6\' 3"'  (1.905 m), weight 182 lb (82.6 kg), SpO2 99 %.    HEENT: no thrush or ulcers. Resp: good air movement bilaterally. Bilateral Pleuryx catheters. Rub right lower lung field. No respiratory distress. Cardio: regular rate and rhythm. GI: no hepatomegaly. Vascular: pitting edema below the knees bilaterally. Port-A-Cath without erythema.  Lab Results:  Lab Results  Component Value Date   WBC 2.6 (L) 02/19/2017   HGB 12.2 (L) 02/19/2017   HCT 38.2 (L) 02/19/2017   MCV 104.4 (H) 02/19/2017   PLT 135 (L) 02/19/2017   NEUTROABS 1.7 02/19/2017    Imaging:  No results found.  Medications: I have reviewed the patient's current medications.  Assessment/Plan: 1. Adenocarcinoma of the distal esophagus,uT3uN2  Presenting with a food impaction and dysphagia 08/20/2014  Staging CT scans of the chest, abdomen, and pelvis 10/06/2014 with indeterminate pulmonary nodules, a 9 mm paraesophageal lymph node, and circumferential thickening of the distal esophagus  PET scan 10/23/2014 confirmed hypermetabolic thickening at the distal esophagus with small paraesophageal nodes having faint nonspecific activity. A pleural-based focus  of consolidation at the posterior right lower lobe had low metabolic activity-favored to be a benign process.  Initiation of concurrent weekly Taxol/carboplatin and radiation on 11/02/2014, radiation completed 12/09/2014  CTs 12/31/2014 revealed distal esophageal wall thickening with adjacent small lymph nodes suspicious for metastases, stable lung nodule posterior to the right bronchus intermedius and the persistent 5 mm nodule in the right lower lobe  Esophagectomy 02/01/2015 with the pathology confirming a ypT3,ypN3 tumor, positive distal margin  MSI-stable, tumor mutation burden-5  CT chest 07/17/2016-bilateral pleural effusions, Bibasilar airspace opacities with peribronchial vascular nodularity, new low-density right liver lesion  Left thoracentesis 07/19/2016-cytology showedreactive mesothelial cells; mixed lymphoid population  Ultrasound-guided biopsy of a segment 8 liver lesion on 08/01/2016-adenocarcinoma  Cycle 1 FOLFOX 09/06/2016  Cycle 2 FOLFOX 09/21/2016  Cycle 3 FOLFOX 10/05/2016  Cycle 4 FOLFOX 11/02/2016  Cycle 5 FOLFOX 11/20/2016  CT chest 12/01/2016-increased size of right liver lesion, persistent bilateral pleural effusions  Taxol and ramcirumab 12/12/2016; day 8 Taxol 12/19/2016   Cycle 2 Taxol and ramcirumab 01/09/2017, day 8 Taxol discontinued  Cycle 3 Taxol/ ramucirumab10/05/2016, Taxol dose reduced  Cycle 4 Taxol/ramucirumab 02/06/2017  Cycle 5 Taxol/ramucirumab 02/19/2017  2. Prostatic hypertrophy-Status post TUR 01/26/2016  3. Hypertension  4. History of multiple basal cell skin cancers  5. History of thrombocytopenia secondary to chemotherapy  6. PVCs/bicuspid aortic valve-followed by cardiology  7. Port-A-Cath placement 09/06/2016  8. Neutropenia secondary to chemotherapy. Improved.  9. Bilateral pleural effusions, status post right thoracentesis 11/10/2016, left thoracentesis 11/15/2016  Cytology from  11/15/2016-negative  Cytology from 12/04/2016,12/06/2016, and 12/19/2016-negative  Placement of a left Pleurx catheter 01/03/2017  Placement of a right Pleurx catheter 01/16/2017  Disposition: David [name] appears stable. He has completed 4 cycles of Taxol/ramucirumab. Plan to proceed with cycle 5 today as scheduled. He will return for a follow-up visit and cycle 6 in 2 weeks. He will contact the office in the interim with any problems.  Plan reviewed with Dr. Benay Spice.    Ned Card ANP/GNP-BC   02/19/2017  11:15 AM

## 2017-02-19 NOTE — Patient Instructions (Signed)
[facility] Discharge Instructions for Patients Receiving Chemotherapy  Today you received the following chemotherapy agents Taxol and Cyramza  To help prevent nausea and vomiting after your treatment, we encourage you to take your nausea medication as directed.    If you develop nausea and vomiting that is not controlled by your nausea medication, call the clinic.   BELOW ARE SYMPTOMS THAT SHOULD BE REPORTED IMMEDIATELY:  *FEVER GREATER THAN 100.5 F  *CHILLS WITH OR WITHOUT FEVER  NAUSEA AND VOMITING THAT IS NOT CONTROLLED WITH YOUR NAUSEA MEDICATION  *UNUSUAL SHORTNESS OF BREATH  *UNUSUAL BRUISING OR BLEEDING  TENDERNESS IN MOUTH AND THROAT WITH OR WITHOUT PRESENCE OF ULCERS  *URINARY PROBLEMS  *BOWEL PROBLEMS  UNUSUAL RASH Items with * indicate a potential emergency and should be followed up as soon as possible.  Feel free to call the clinic should you have any questions or concerns. The clinic phone number is [phone].  Please show the CHEMO ALERT CARD at check-in to the Emergency Department and triage nurse.   

## 2017-02-20 DIAGNOSIS — M1990 Unspecified osteoarthritis, unspecified site: Secondary | ICD-10-CM | POA: Diagnosis not present

## 2017-02-20 DIAGNOSIS — J910 Malignant pleural effusion: Secondary | ICD-10-CM | POA: Diagnosis not present

## 2017-02-20 DIAGNOSIS — K2270 Barrett's esophagus without dysplasia: Secondary | ICD-10-CM | POA: Diagnosis not present

## 2017-02-20 DIAGNOSIS — I10 Essential (primary) hypertension: Secondary | ICD-10-CM | POA: Diagnosis not present

## 2017-02-20 DIAGNOSIS — F419 Anxiety disorder, unspecified: Secondary | ICD-10-CM | POA: Diagnosis not present

## 2017-02-20 DIAGNOSIS — C159 Malignant neoplasm of esophagus, unspecified: Secondary | ICD-10-CM | POA: Diagnosis not present

## 2017-02-26 ENCOUNTER — Ambulatory Visit: Payer: Medicare Other

## 2017-02-27 DIAGNOSIS — M1990 Unspecified osteoarthritis, unspecified site: Secondary | ICD-10-CM | POA: Diagnosis not present

## 2017-02-27 DIAGNOSIS — I10 Essential (primary) hypertension: Secondary | ICD-10-CM | POA: Diagnosis not present

## 2017-02-27 DIAGNOSIS — F419 Anxiety disorder, unspecified: Secondary | ICD-10-CM | POA: Diagnosis not present

## 2017-02-27 DIAGNOSIS — C159 Malignant neoplasm of esophagus, unspecified: Secondary | ICD-10-CM | POA: Diagnosis not present

## 2017-02-27 DIAGNOSIS — J910 Malignant pleural effusion: Secondary | ICD-10-CM | POA: Diagnosis not present

## 2017-02-27 DIAGNOSIS — K2270 Barrett's esophagus without dysplasia: Secondary | ICD-10-CM | POA: Diagnosis not present

## 2017-03-04 ENCOUNTER — Other Ambulatory Visit: Payer: Self-pay | Admitting: Oncology

## 2017-03-06 ENCOUNTER — Ambulatory Visit (HOSPITAL_BASED_OUTPATIENT_CLINIC_OR_DEPARTMENT_OTHER): Payer: Medicare Other

## 2017-03-06 ENCOUNTER — Other Ambulatory Visit: Payer: Self-pay

## 2017-03-06 ENCOUNTER — Ambulatory Visit (HOSPITAL_BASED_OUTPATIENT_CLINIC_OR_DEPARTMENT_OTHER): Payer: Medicare Other | Admitting: Oncology

## 2017-03-06 ENCOUNTER — Other Ambulatory Visit (HOSPITAL_BASED_OUTPATIENT_CLINIC_OR_DEPARTMENT_OTHER): Payer: Medicare Other

## 2017-03-06 ENCOUNTER — Ambulatory Visit: Payer: Medicare Other

## 2017-03-06 VITALS — BP 123/57 | HR 72 | Temp 97.7°F | Resp 18 | Ht 75.0 in | Wt 185.8 lb

## 2017-03-06 DIAGNOSIS — C155 Malignant neoplasm of lower third of esophagus: Secondary | ICD-10-CM

## 2017-03-06 DIAGNOSIS — Z5112 Encounter for antineoplastic immunotherapy: Secondary | ICD-10-CM

## 2017-03-06 DIAGNOSIS — Z23 Encounter for immunization: Secondary | ICD-10-CM | POA: Diagnosis not present

## 2017-03-06 DIAGNOSIS — Z5111 Encounter for antineoplastic chemotherapy: Secondary | ICD-10-CM | POA: Diagnosis not present

## 2017-03-06 DIAGNOSIS — Z95828 Presence of other vascular implants and grafts: Secondary | ICD-10-CM

## 2017-03-06 LAB — COMPREHENSIVE METABOLIC PANEL
ALBUMIN: 1.9 g/dL — AB (ref 3.5–5.0)
ALK PHOS: 93 U/L (ref 40–150)
ALT: 30 U/L (ref 0–55)
AST: 32 U/L (ref 5–34)
Anion Gap: 4 mEq/L (ref 3–11)
BILIRUBIN TOTAL: 0.42 mg/dL (ref 0.20–1.20)
BUN: 26.3 mg/dL — ABNORMAL HIGH (ref 7.0–26.0)
CO2: 29 meq/L (ref 22–29)
Calcium: 8.3 mg/dL — ABNORMAL LOW (ref 8.4–10.4)
Chloride: 112 mEq/L — ABNORMAL HIGH (ref 98–109)
Creatinine: 0.8 mg/dL (ref 0.7–1.3)
EGFR: >60 mL/min/{1.73_m2} (ref >60)
GLUCOSE: 122 mg/dL (ref 70–140)
Potassium: 4.2 mEq/L (ref 3.5–5.1)
SODIUM: 145 meq/L (ref 136–145)
Total Protein: 5 g/dL — ABNORMAL LOW (ref 6.4–8.3)

## 2017-03-06 LAB — CBC WITH DIFFERENTIAL/PLATELET
BASO%: 1.7 % (ref 0.0–2.0)
BASOS ABS: 0 10*3/uL (ref 0.0–0.1)
EOS ABS: 0.1 10*3/uL (ref 0.0–0.5)
EOS%: 2.6 % (ref 0.0–7.0)
HCT: 37.5 % — ABNORMAL LOW (ref 38.4–49.9)
HEMOGLOBIN: 12.2 g/dL — AB (ref 13.0–17.1)
LYMPH%: 18 % (ref 14.0–49.0)
MCH: 33.5 pg — AB (ref 27.2–33.4)
MCHC: 32.6 g/dL (ref 32.0–36.0)
MCV: 102.8 fL — AB (ref 79.3–98.0)
MONO#: 0.3 10*3/uL (ref 0.1–0.9)
MONO%: 17 % — AB (ref 0.0–14.0)
NEUT#: 1.2 10*3/uL — ABNORMAL LOW (ref 1.5–6.5)
NEUT%: 60.7 % (ref 39.0–75.0)
Platelets: 155 10*3/uL (ref 140–400)
RBC: 3.65 10*6/uL — ABNORMAL LOW (ref 4.20–5.82)
RDW: 14.2 % (ref 11.0–14.6)
WBC: 2 10*3/uL — ABNORMAL LOW (ref 4.0–10.3)
lymph#: 0.4 10*3/uL — ABNORMAL LOW (ref 0.9–3.3)

## 2017-03-06 MED ORDER — SODIUM CHLORIDE 0.9% FLUSH
10.0000 mL | Freq: Once | INTRAVENOUS | Status: AC
  Administered 2017-03-06: 10 mL
  Filled 2017-03-06: qty 10

## 2017-03-06 MED ORDER — DEXAMETHASONE SODIUM PHOSPHATE 10 MG/ML IJ SOLN
10.0000 mg | Freq: Once | INTRAMUSCULAR | Status: AC
  Administered 2017-03-06: 10 mg via INTRAVENOUS

## 2017-03-06 MED ORDER — INFLUENZA VAC SPLIT HIGH-DOSE 0.5 ML IM SUSY
0.5000 mL | PREFILLED_SYRINGE | Freq: Once | INTRAMUSCULAR | Status: AC
  Administered 2017-03-06: 0.5 mL via INTRAMUSCULAR
  Filled 2017-03-06: qty 0.5

## 2017-03-06 MED ORDER — DEXAMETHASONE SODIUM PHOSPHATE 10 MG/ML IJ SOLN
INTRAMUSCULAR | Status: AC
  Filled 2017-03-06: qty 1

## 2017-03-06 MED ORDER — DIPHENHYDRAMINE HCL 50 MG/ML IJ SOLN
25.0000 mg | Freq: Once | INTRAMUSCULAR | Status: AC
  Administered 2017-03-06: 25 mg via INTRAVENOUS

## 2017-03-06 MED ORDER — FAMOTIDINE IN NACL 20-0.9 MG/50ML-% IV SOLN
20.0000 mg | Freq: Once | INTRAVENOUS | Status: AC
  Administered 2017-03-06: 20 mg via INTRAVENOUS

## 2017-03-06 MED ORDER — SODIUM CHLORIDE 0.9 % IV SOLN
Freq: Once | INTRAVENOUS | Status: AC
  Administered 2017-03-06: 11:00:00 via INTRAVENOUS

## 2017-03-06 MED ORDER — HEPARIN SOD (PORK) LOCK FLUSH 100 UNIT/ML IV SOLN
500.0000 [IU] | Freq: Once | INTRAVENOUS | Status: AC | PRN
  Administered 2017-03-06: 500 [IU]
  Filled 2017-03-06: qty 5

## 2017-03-06 MED ORDER — DOXYCYCLINE HYCLATE 100 MG PO TABS: 100.0000 mg | ORAL_TABLET | Freq: Two times a day (BID) | ORAL | 0 refills | Status: DC

## 2017-03-06 MED ORDER — SODIUM CHLORIDE 0.9 % IV SOLN
45.0000 mg/m2 | Freq: Once | INTRAVENOUS | Status: AC
  Administered 2017-03-06: 90 mg via INTRAVENOUS
  Filled 2017-03-06: qty 15

## 2017-03-06 MED ORDER — SODIUM CHLORIDE 0.9 % IV SOLN
8.0000 mg/kg | Freq: Once | INTRAVENOUS | Status: AC
  Administered 2017-03-06: 600 mg via INTRAVENOUS
  Filled 2017-03-06: qty 10

## 2017-03-06 MED ORDER — SODIUM CHLORIDE 0.9% FLUSH
10.0000 mL | INTRAVENOUS | Status: DC | PRN
  Administered 2017-03-06: 10 mL
  Filled 2017-03-06: qty 10

## 2017-03-06 MED ORDER — DIPHENHYDRAMINE HCL 50 MG/ML IJ SOLN
INTRAMUSCULAR | Status: AC
  Filled 2017-03-06: qty 1

## 2017-03-06 MED ORDER — FAMOTIDINE IN NACL 20-0.9 MG/50ML-% IV SOLN
INTRAVENOUS | Status: AC
  Filled 2017-03-06: qty 50

## 2017-03-06 MED ORDER — ACETAMINOPHEN 325 MG PO TABS
650.0000 mg | ORAL_TABLET | Freq: Once | ORAL | Status: AC
  Administered 2017-03-06: 650 mg via ORAL

## 2017-03-06 MED ORDER — ACETAMINOPHEN 325 MG PO TABS
ORAL_TABLET | ORAL | Status: AC
  Filled 2017-03-06: qty 2

## 2017-03-06 NOTE — Patient Instructions (Signed)
Implanted Port Home Guide An implanted port is a type of central line that is placed under the skin. Central lines are used to provide IV access when treatment or nutrition needs to be given through a person's veins. Implanted ports are used for long-term IV access. An implanted port may be placed because:  You need IV medicine that would be irritating to the small veins in your hands or arms.  You need long-term IV medicines, such as antibiotics.  You need IV nutrition for a long period.  You need frequent blood draws for lab tests.  You need dialysis.  Implanted ports are usually placed in the chest area, but they can also be placed in the upper arm, the abdomen, or the leg. An implanted port has two main parts:  Reservoir. The reservoir is round and will appear as a small, raised area under your skin. The reservoir is the part where a needle is inserted to give medicines or draw blood.  Catheter. The catheter is a thin, flexible tube that extends from the reservoir. The catheter is placed into a large vein. Medicine that is inserted into the reservoir goes into the catheter and then into the vein.  How will I care for my incision site? Do not get the incision site wet. Bathe or shower as directed by your health care provider. How is my port accessed? Special steps must be taken to access the port:  Before the port is accessed, a numbing cream can be placed on the skin. This helps numb the skin over the port site.  Your health care provider uses a sterile technique to access the port. ? Your health care provider must put on a mask and sterile gloves. ? The skin over your port is cleaned carefully with an antiseptic and allowed to dry. ? The port is gently pinched between sterile gloves, and a needle is inserted into the port.  Only "non-coring" port needles should be used to access the port. Once the port is accessed, a blood return should be checked. This helps ensure that the port  is in the vein and is not clogged.  If your port needs to remain accessed for a constant infusion, a clear (transparent) bandage will be placed over the needle site. The bandage and needle will need to be changed every week, or as directed by your health care provider.  Keep the bandage covering the needle clean and dry. Do not get it wet. Follow your health care provider's instructions on how to take a shower or bath while the port is accessed.  If your port does not need to stay accessed, no bandage is needed over the port.  What is flushing? Flushing helps keep the port from getting clogged. Follow your health care provider's instructions on how and when to flush the port. Ports are usually flushed with saline solution or a medicine called heparin. The need for flushing will depend on how the port is used.  If the port is used for intermittent medicines or blood draws, the port will need to be flushed: ? After medicines have been given. ? After blood has been drawn. ? As part of routine maintenance.  If a constant infusion is running, the port may not need to be flushed.  How long will my port stay implanted? The port can stay in for as long as your health care provider thinks it is needed. When it is time for the port to come out, surgery will be   done to remove it. The procedure is similar to the one performed when the port was put in. When should I seek immediate medical care? When you have an implanted port, you should seek immediate medical care if:  You notice a bad smell coming from the incision site.  You have swelling, redness, or drainage at the incision site.  You have more swelling or pain at the port site or the surrounding area.  You have a fever that is not controlled with medicine.  This information is not intended to replace advice given to you by your health care provider. Make sure you discuss any questions you have with your health care provider. Document  Released: 04/10/2005 Document Revised: 09/16/2015 Document Reviewed: 12/16/2012 Elsevier Interactive Patient Education  2017 [employer]  

## 2017-03-06 NOTE — Progress Notes (Signed)
Proceed with chemo today despite ANC 1.2, per Dr. Benay Spice. Amy E, Infusion RN made aware.

## 2017-03-06 NOTE — Patient Instructions (Signed)
[facility] Discharge Instructions for Patients Receiving Chemotherapy  Today you received the following chemotherapy agents Taxol; Cyramza  To help prevent nausea and vomiting after your treatment, we encourage you to take your nausea medication as directed   If you develop nausea and vomiting that is not controlled by your nausea medication, call the clinic.   BELOW ARE SYMPTOMS THAT SHOULD BE REPORTED IMMEDIATELY:  *FEVER GREATER THAN 100.5 F  *CHILLS WITH OR WITHOUT FEVER  NAUSEA AND VOMITING THAT IS NOT CONTROLLED WITH YOUR NAUSEA MEDICATION  *UNUSUAL SHORTNESS OF BREATH  *UNUSUAL BRUISING OR BLEEDING  TENDERNESS IN MOUTH AND THROAT WITH OR WITHOUT PRESENCE OF ULCERS  *URINARY PROBLEMS  *BOWEL PROBLEMS  UNUSUAL RASH Items with * indicate a potential emergency and should be followed up as soon as possible.  Feel free to call the clinic should you have any questions or concerns. The clinic phone number is [phone].  Please show the [place] at check-in to the Emergency Department and triage nurse.

## 2017-03-06 NOTE — Progress Notes (Signed)
[facility] OFFICE PROGRESS NOTE   Diagnosis: Esophagus cancer  INTERVAL HISTORY:   David [name] returns as scheduled.  He completed another treatment with chemotherapy on 02/19/2017.  He tolerated the chemotherapy well.  No neuropathy symptoms.  Good appetite.  The drainage has decreased from the left Pleurx.  He continues to have approximately 700 cc of drainage daily from the right Pleurx. He has persistent truncal and leg edema.  Objective:  Vital signs in last 24 hours:  Blood pressure (!) 123/57, pulse 72, temperature 97.7 F (36.5 C), temperature source Oral, resp. rate 18, height '6\' 3"'  (1.905 m), weight 185 lb 12.8 oz (84.3 kg), SpO2 96 %.    HEENT: No thrush or ulcers Resp: Diminished breath sounds at the left lower chest, no respiratory distress Cardio: Regular rate and rhythm GI: No hepatomegaly, nontender Vascular: Pitting edema throughout the legs bilaterally.  Pitting edema at the waist  Skin: Left Pleurx skin exit site with tenderness and erythema.  No discharge.  Portacath/PICC-without erythema  Lab Results:  Lab Results  Component Value Date   WBC 2.0 (L) 03/06/2017   HGB 12.2 (L) 03/06/2017   HCT 37.5 (L) 03/06/2017   MCV 102.8 (H) 03/06/2017   PLT 155 03/06/2017   NEUTROABS 1.2 (L) 03/06/2017    CMP     Component Value Date/Time   NA 145 03/06/2017 0907   K 4.2 03/06/2017 0907   CL 105 01/16/2017 0646   CO2 29 03/06/2017 0907   GLUCOSE 122 03/06/2017 0907   BUN 26.3 (H) 03/06/2017 0907   CREATININE 0.8 03/06/2017 0907   CALCIUM 8.3 (L) 03/06/2017 0907   PROT 5.0 (L) 03/06/2017 0907   ALBUMIN 1.9 (L) 03/06/2017 0907   AST 32 03/06/2017 0907   ALT 30 03/06/2017 0907   ALKPHOS 93 03/06/2017 0907   BILITOT 0.42 03/06/2017 0907   GFRNONAA >60 01/16/2017 0646   GFRAA >60 01/16/2017 0646      Medications: I have reviewed the patient's current medications.  Assessment/Plan: 1. Adenocarcinoma of the distal  esophagus,uT3uN2  Presenting with a food impaction and dysphagia 08/20/2014  Staging CT scans of the chest, abdomen, and pelvis 10/06/2014 with indeterminate pulmonary nodules, a 9 mm paraesophageal lymph node, and circumferential thickening of the distal esophagus  PET scan 10/23/2014 confirmed hypermetabolic thickening at the distal esophagus with small paraesophageal nodes having faint nonspecific activity. A pleural-based focus of consolidation at the posterior right lower lobe had low metabolic activity-favored to be a benign process.  Initiation of concurrent weekly Taxol/carboplatin and radiation on 11/02/2014, radiation completed 12/09/2014  CTs 12/31/2014 revealed distal esophageal wall thickening with adjacent small lymph nodes suspicious for metastases, stable lung nodule posterior to the right bronchus intermedius and the persistent 5 mm nodule in the right lower lobe  Esophagectomy 02/01/2015 with the pathology confirming a ypT3,ypN3 tumor, positive distal margin  MSI-stable, tumor mutation burden-5  CT chest 07/17/2016-bilateral pleural effusions, Bibasilar airspace opacities with peribronchial vascular nodularity, new low-density right liver lesion  Left thoracentesis 07/19/2016-cytology showedreactive mesothelial cells; mixed lymphoid population  Ultrasound-guided biopsy of a segment 8 liver lesion on 08/01/2016-adenocarcinoma  Cycle 1 FOLFOX 09/06/2016  Cycle 2 FOLFOX 09/21/2016  Cycle 3 FOLFOX 10/05/2016  Cycle 4 FOLFOX 11/02/2016  Cycle 5 FOLFOX 11/20/2016  CT chest 12/01/2016-increased size of right liver lesion, persistent bilateral pleural effusions  Taxol and ramcirumab 12/12/2016; day 8 Taxol 12/19/2016   Cycle 2 Taxol and ramcirumab 01/09/2017, day 8 Taxol discontinued  Cycle 3 Taxol/ ramucirumab10/05/2016,  Taxol dose reduced  Cycle 4 Taxol/ramucirumab 02/06/2017  Cycle 5 Taxol/ramucirumab 02/19/2017  Cycle 6 Taxol/ramucirumab 03/06/2017  2.  Prostatic hypertrophy-Status post TUR 01/26/2016  3. Hypertension  4. History of multiple basal cell skin cancers  5. History of thrombocytopenia secondary to chemotherapy  6. PVCs/bicuspid aortic valve-followed by cardiology  7. Port-A-Cath placement 09/06/2016  8. Neutropenia secondary to chemotherapy.  9. Bilateral pleural effusions, status post right thoracentesis 11/10/2016, left thoracentesis 11/15/2016  Cytology from 11/15/2016-negative  Cytology from 12/04/2016,12/06/2016, and 12/19/2016-negative  Placement of a left Pleurx catheter 01/03/2017  Placement of a right Pleurx catheter 01/16/2017   Disposition:  David [name] appears unchanged.  He is tolerating the Taxol/ramucirumab well.  He will complete another treatment today.  He will undergo a restaging CT evaluation after this cycle.  He has persistent drainage from the Pleurx catheters, though the left catheter output has diminished.  The Pleurx catheters will remain in place.  There may be an early infection at the left Pleurx site.  He will be prescribed doxycycline.  David [name] has mild neutropenia today.  He will contact us for a fever.  We decided to proceed with chemotherapy today.  He has persistent anasarca, likely secondary to hypoalbuminemia.  He will return for an office visit in 2 weeks.  25 minutes were spent with the patient today.  The majority of the time was used for counseling and coordination of care.  Betsy Coder, MD  03/06/2017  10:01 AM

## 2017-03-07 ENCOUNTER — Other Ambulatory Visit: Payer: Self-pay | Admitting: *Deleted

## 2017-03-07 MED ORDER — DOXYCYCLINE HYCLATE 100 MG PO TABS: 100.0000 mg | ORAL_TABLET | Freq: Two times a day (BID) | ORAL | 0 refills | Status: DC

## 2017-03-07 NOTE — Telephone Encounter (Signed)
Message from pt reporting [facility] did not receive antibiotic script. Returned call, informed him script was sent to [facility]. Re-sent script to pt's preferred pharmacy and removed [facility] from his list. Pt voiced appreciation for return call. He will pick up antibiotic today.

## 2017-03-12 ENCOUNTER — Telehealth: Payer: Self-pay

## 2017-03-12 NOTE — Telephone Encounter (Signed)
Called and spoke with patient concerning upcoming appointment.for CT and lab. Per 11/19 los

## 2017-03-14 ENCOUNTER — Other Ambulatory Visit (HOSPITAL_BASED_OUTPATIENT_CLINIC_OR_DEPARTMENT_OTHER): Payer: Medicare Other

## 2017-03-14 ENCOUNTER — Encounter (HOSPITAL_COMMUNITY): Payer: Self-pay

## 2017-03-14 ENCOUNTER — Telehealth: Payer: Self-pay | Admitting: *Deleted

## 2017-03-14 ENCOUNTER — Ambulatory Visit (HOSPITAL_COMMUNITY)
Admission: RE | Admit: 2017-03-14 | Discharge: 2017-03-14 | Disposition: A | Discharge status: Home / Self Care | Payer: Medicare Other | Source: Ambulatory Visit | Attending: Oncology | Admitting: Oncology

## 2017-03-14 DIAGNOSIS — C155 Malignant neoplasm of lower third of esophagus: Secondary | ICD-10-CM | POA: Diagnosis not present

## 2017-03-14 DIAGNOSIS — C787 Secondary malignant neoplasm of liver and intrahepatic bile duct: Secondary | ICD-10-CM | POA: Diagnosis not present

## 2017-03-14 DIAGNOSIS — J90 Pleural effusion, not elsewhere classified: Secondary | ICD-10-CM | POA: Insufficient documentation

## 2017-03-14 LAB — COMPREHENSIVE METABOLIC PANEL
ALT: 23 U/L (ref 0–55)
ANION GAP: 5 meq/L (ref 3–11)
AST: 26 U/L (ref 5–34)
Albumin: 1.9 g/dL — ABNORMAL LOW (ref 3.5–5.0)
Alkaline Phosphatase: 92 U/L (ref 40–150)
BUN: 29.6 mg/dL — ABNORMAL HIGH (ref 7.0–26.0)
CHLORIDE: 110 meq/L — AB (ref 98–109)
CO2: 30 meq/L — AB (ref 22–29)
Calcium: 8.4 mg/dL (ref 8.4–10.4)
Creatinine: 0.8 mg/dL (ref 0.7–1.3)
Glucose: 87 mg/dl (ref 70–140)
Potassium: 4.4 mEq/L (ref 3.5–5.1)
Sodium: 145 mEq/L (ref 136–145)
Total Bilirubin: 0.33 mg/dL (ref 0.20–1.20)
Total Protein: 5 g/dL — ABNORMAL LOW (ref 6.4–8.3)

## 2017-03-14 LAB — CBC WITH DIFFERENTIAL/PLATELET
BASO%: 0.9 % (ref 0.0–2.0)
BASOS ABS: 0 10*3/uL (ref 0.0–0.1)
EOS%: 1.2 % (ref 0.0–7.0)
Eosinophils Absolute: 0 10*3/uL (ref 0.0–0.5)
HEMATOCRIT: 39 % (ref 38.4–49.9)
HEMOGLOBIN: 12.4 g/dL — AB (ref 13.0–17.1)
LYMPH#: 0.5 10*3/uL — AB (ref 0.9–3.3)
LYMPH%: 16.2 % (ref 14.0–49.0)
MCH: 33.4 pg (ref 27.2–33.4)
MCHC: 31.8 g/dL — ABNORMAL LOW (ref 32.0–36.0)
MCV: 105.1 fL — AB (ref 79.3–98.0)
MONO#: 0.4 10*3/uL (ref 0.1–0.9)
MONO%: 11.7 % (ref 0.0–14.0)
NEUT#: 2.3 10*3/uL (ref 1.5–6.5)
NEUT%: 70 % (ref 39.0–75.0)
Platelets: 135 10*3/uL — ABNORMAL LOW (ref 140–400)
RBC: 3.71 10*6/uL — ABNORMAL LOW (ref 4.20–5.82)
RDW: 13.7 % (ref 11.0–14.6)
WBC: 3.3 10*3/uL — ABNORMAL LOW (ref 4.0–10.3)

## 2017-03-14 MED ORDER — IOPAMIDOL (ISOVUE-300) INJECTION 61%
INTRAVENOUS | Status: AC
  Filled 2017-03-14: qty 75

## 2017-03-14 MED ORDER — IOPAMIDOL (ISOVUE-300) INJECTION 61%
75.0000 mL | Freq: Once | INTRAVENOUS | Status: AC | PRN
  Administered 2017-03-14: 75 mL via INTRAVENOUS

## 2017-03-14 NOTE — Telephone Encounter (Signed)
"  I'm schedule for CT scan later today.  May I take my blood pressure pill and may I drink a little water?  Please call [phone]."  Returned call to notify him he may have whatever he likes until 9:30 am. Water only beginning at 9:30 am, four hours before scans.  Denies futher needs or questions at this time.

## 2017-03-16 ENCOUNTER — Telehealth: Payer: Self-pay | Admitting: *Deleted

## 2017-03-16 NOTE — Telephone Encounter (Signed)
-----   Message from Ladell Pier, MD sent at 03/16/2017  1:37 PM EST ----- Please call patient, stable liver lesion, no evidence of progressive cancer, f/u as scheduled

## 2017-03-18 ENCOUNTER — Other Ambulatory Visit: Payer: Self-pay | Admitting: Oncology

## 2017-03-19 ENCOUNTER — Ambulatory Visit (HOSPITAL_COMMUNITY): Payer: Medicare Other

## 2017-03-19 ENCOUNTER — Ambulatory Visit: Payer: Medicare Other

## 2017-03-19 ENCOUNTER — Other Ambulatory Visit: Payer: Self-pay | Admitting: *Deleted

## 2017-03-19 ENCOUNTER — Telehealth: Payer: Self-pay | Admitting: Oncology

## 2017-03-19 ENCOUNTER — Ambulatory Visit (HOSPITAL_BASED_OUTPATIENT_CLINIC_OR_DEPARTMENT_OTHER): Payer: Medicare Other

## 2017-03-19 ENCOUNTER — Other Ambulatory Visit: Payer: Medicare Other

## 2017-03-19 ENCOUNTER — Ambulatory Visit (HOSPITAL_BASED_OUTPATIENT_CLINIC_OR_DEPARTMENT_OTHER): Payer: Medicare Other | Admitting: Oncology

## 2017-03-19 VITALS — BP 130/72 | HR 68 | Temp 97.8°F | Resp 17 | Ht 75.0 in | Wt 180.6 lb

## 2017-03-19 DIAGNOSIS — Z95828 Presence of other vascular implants and grafts: Secondary | ICD-10-CM

## 2017-03-19 DIAGNOSIS — I10 Essential (primary) hypertension: Secondary | ICD-10-CM

## 2017-03-19 DIAGNOSIS — R200 Anesthesia of skin: Secondary | ICD-10-CM

## 2017-03-19 DIAGNOSIS — Z5111 Encounter for antineoplastic chemotherapy: Secondary | ICD-10-CM | POA: Diagnosis not present

## 2017-03-19 DIAGNOSIS — C155 Malignant neoplasm of lower third of esophagus: Secondary | ICD-10-CM | POA: Diagnosis not present

## 2017-03-19 DIAGNOSIS — Z5112 Encounter for antineoplastic immunotherapy: Secondary | ICD-10-CM

## 2017-03-19 DIAGNOSIS — J90 Pleural effusion, not elsewhere classified: Secondary | ICD-10-CM

## 2017-03-19 LAB — COMPREHENSIVE METABOLIC PANEL
ALT: 23 U/L (ref 0–55)
ANION GAP: 6 meq/L (ref 3–11)
AST: 30 U/L (ref 5–34)
Albumin: 1.9 g/dL — ABNORMAL LOW (ref 3.5–5.0)
Alkaline Phosphatase: 91 U/L (ref 40–150)
BUN: 30.4 mg/dL — ABNORMAL HIGH (ref 7.0–26.0)
CALCIUM: 8.2 mg/dL — AB (ref 8.4–10.4)
CHLORIDE: 111 meq/L — AB (ref 98–109)
CO2: 26 mEq/L (ref 22–29)
CREATININE: 0.8 mg/dL (ref 0.7–1.3)
Glucose: 86 mg/dl (ref 70–140)
POTASSIUM: 3.8 meq/L (ref 3.5–5.1)
Sodium: 143 mEq/L (ref 136–145)
Total Bilirubin: 0.36 mg/dL (ref 0.20–1.20)
Total Protein: 5 g/dL — ABNORMAL LOW (ref 6.4–8.3)

## 2017-03-19 LAB — CBC WITH DIFFERENTIAL/PLATELET
BASO%: 0.8 % (ref 0.0–2.0)
Basophils Absolute: 0 10*3/uL (ref 0.0–0.1)
EOS ABS: 0.1 10*3/uL (ref 0.0–0.5)
EOS%: 2.5 % (ref 0.0–7.0)
HEMATOCRIT: 38.9 % (ref 38.4–49.9)
HEMOGLOBIN: 12.5 g/dL — AB (ref 13.0–17.1)
LYMPH#: 0.4 10*3/uL — AB (ref 0.9–3.3)
LYMPH%: 16.9 % (ref 14.0–49.0)
MCH: 33.4 pg (ref 27.2–33.4)
MCHC: 32.1 g/dL (ref 32.0–36.0)
MCV: 104 fL — AB (ref 79.3–98.0)
MONO#: 0.4 10*3/uL (ref 0.1–0.9)
MONO%: 16.9 % — AB (ref 0.0–14.0)
NEUT%: 62.9 % (ref 39.0–75.0)
NEUTROS ABS: 1.5 10*3/uL (ref 1.5–6.5)
Platelets: 154 10*3/uL (ref 140–400)
RBC: 3.74 10*6/uL — ABNORMAL LOW (ref 4.20–5.82)
RDW: 13.8 % (ref 11.0–14.6)
WBC: 2.4 10*3/uL — AB (ref 4.0–10.3)

## 2017-03-19 LAB — CEA (IN HOUSE-CHCC): CEA (CHCC-IN HOUSE): 5.74 ng/mL — AB (ref 0.00–5.00)

## 2017-03-19 MED ORDER — SODIUM CHLORIDE 0.9 % IV SOLN
8.0000 mg/kg | Freq: Once | INTRAVENOUS | Status: AC
  Administered 2017-03-19: 600 mg via INTRAVENOUS
  Filled 2017-03-19: qty 50

## 2017-03-19 MED ORDER — DIPHENHYDRAMINE HCL 50 MG/ML IJ SOLN
25.0000 mg | Freq: Once | INTRAMUSCULAR | Status: AC
  Administered 2017-03-19: 25 mg via INTRAVENOUS

## 2017-03-19 MED ORDER — DEXAMETHASONE SODIUM PHOSPHATE 10 MG/ML IJ SOLN
INTRAMUSCULAR | Status: AC
  Filled 2017-03-19: qty 1

## 2017-03-19 MED ORDER — SODIUM CHLORIDE 0.9 % IV SOLN
Freq: Once | INTRAVENOUS | Status: AC
  Administered 2017-03-19: 12:00:00 via INTRAVENOUS

## 2017-03-19 MED ORDER — POTASSIUM CHLORIDE ER 10 MEQ PO CPCR: 10.0000 meq | ORAL_CAPSULE | Freq: Every day | ORAL | 1 refills | Status: AC

## 2017-03-19 MED ORDER — FUROSEMIDE 20 MG PO TABS: 20.0000 mg | ORAL_TABLET | Freq: Two times a day (BID) | ORAL | 0 refills | Status: DC

## 2017-03-19 MED ORDER — HEPARIN SOD (PORK) LOCK FLUSH 100 UNIT/ML IV SOLN
500.0000 [IU] | Freq: Once | INTRAVENOUS | Status: AC | PRN
  Administered 2017-03-19: 500 [IU]
  Filled 2017-03-19: qty 5

## 2017-03-19 MED ORDER — DEXAMETHASONE SODIUM PHOSPHATE 10 MG/ML IJ SOLN
10.0000 mg | Freq: Once | INTRAMUSCULAR | Status: AC
  Administered 2017-03-19: 10 mg via INTRAVENOUS

## 2017-03-19 MED ORDER — DIPHENHYDRAMINE HCL 50 MG/ML IJ SOLN
INTRAMUSCULAR | Status: AC
  Filled 2017-03-19: qty 1

## 2017-03-19 MED ORDER — FAMOTIDINE IN NACL 20-0.9 MG/50ML-% IV SOLN
INTRAVENOUS | Status: AC
  Filled 2017-03-19: qty 50

## 2017-03-19 MED ORDER — ACETAMINOPHEN 325 MG PO TABS
650.0000 mg | ORAL_TABLET | Freq: Once | ORAL | Status: AC
  Administered 2017-03-19: 650 mg via ORAL

## 2017-03-19 MED ORDER — SODIUM CHLORIDE 0.9% FLUSH
10.0000 mL | INTRAVENOUS | Status: DC | PRN
  Administered 2017-03-19: 10 mL
  Filled 2017-03-19: qty 10

## 2017-03-19 MED ORDER — SODIUM CHLORIDE 0.9 % IV SOLN
45.0000 mg/m2 | Freq: Once | INTRAVENOUS | Status: AC
  Administered 2017-03-19: 90 mg via INTRAVENOUS
  Filled 2017-03-19: qty 15

## 2017-03-19 MED ORDER — FAMOTIDINE IN NACL 20-0.9 MG/50ML-% IV SOLN
20.0000 mg | Freq: Once | INTRAVENOUS | Status: AC
  Administered 2017-03-19: 20 mg via INTRAVENOUS

## 2017-03-19 MED ORDER — SODIUM CHLORIDE 0.9% FLUSH
10.0000 mL | Freq: Once | INTRAVENOUS | Status: AC
  Administered 2017-03-19: 10 mL
  Filled 2017-03-19: qty 10

## 2017-03-19 MED ORDER — ACETAMINOPHEN 325 MG PO TABS
ORAL_TABLET | ORAL | Status: AC
  Filled 2017-03-19: qty 2

## 2017-03-19 NOTE — Progress Notes (Signed)
[facility] OFFICE PROGRESS NOTE   Diagnosis: Esophagus cancer  INTERVAL HISTORY:   [name] returns as scheduled.  He completed another treatment with Taxol/ramucirumab 03/06/2017.  He has noted mild numbness in the fingertips in the mornings.  This does not interfere with activity.  The drainage from the Pleurx catheters has diminished.  He reports approximately 300-350 cc daily from the left catheter and 500 cc from the right catheter.  He continues to have pain at the left Pleurx skin exit.  Doxycycline did not help.  The pain is worse with movement and position change.  He reports stable dysphagia.  He has a good appetite.  His chief complaint is persistent leg edema.  Objective:  Vital signs in last 24 hours:  Blood pressure 130/72, pulse 68, temperature 97.8 F (36.6 C), temperature source Oral, resp. rate 17, height 6' 3" (1.905 m), weight 180 lb 9.6 oz (81.9 kg), SpO2 99 %.    HEENT: No thrush or ulcers Resp: Inspiratory rub at the right posterior chest and left anterior chest, no respiratory distress Cardio: Regular rate and rhythm GI: No hepatomegaly, nontender Vascular: 1-2+ pitting edema below the knee bilaterally Neuro: Mild loss of vibratory sense at the fingertips bilaterally  Portacath/PICC-without erythema  Lab Results:  Lab Results  Component Value Date   WBC 3.3 (L) 03/14/2017   HGB 12.4 (L) 03/14/2017   HCT 39.0 03/14/2017   MCV 105.1 (H) 03/14/2017   PLT 135 (L) 03/14/2017   NEUTROABS 2.3 03/14/2017    CMP     Component Value Date/Time   NA 145 03/14/2017 1254   K 4.4 03/14/2017 1254   CL 105 01/16/2017 0646   CO2 30 (H) 03/14/2017 1254   GLUCOSE 87 03/14/2017 1254   BUN 29.6 (H) 03/14/2017 1254   CREATININE 0.8 03/14/2017 1254   CALCIUM 8.4 03/14/2017 1254   PROT 5.0 (L) 03/14/2017 1254   ALBUMIN 1.9 (L) 03/14/2017 1254   AST 26 03/14/2017 1254   ALT 23 03/14/2017 1254   ALKPHOS 92 03/14/2017 1254   BILITOT 0.33  03/14/2017 1254   GFRNONAA >60 01/16/2017 0646   GFRAA >60 01/16/2017 0646    Lab Results  Component Value Date   CEA1 3.73 09/06/2016     Imaging: CT images 03/14/2017-reviewed and radiology, nodular changes in the right lung have not changed significantly  Medications: I have reviewed the patient's current medications.  Assessment/Plan: 1. Adenocarcinoma of the distal esophagus,uT3uN2  Presenting with a food impaction and dysphagia 08/20/2014  Staging CT scans of the chest, abdomen, and pelvis 10/06/2014 with indeterminate pulmonary nodules, a 9 mm paraesophageal lymph node, and circumferential thickening of the distal esophagus  PET scan 10/23/2014 confirmed hypermetabolic thickening at the distal esophagus with small paraesophageal nodes having faint nonspecific activity. A pleural-based focus of consolidation at the posterior right lower lobe had low metabolic activity-favored to be a benign process.  Initiation of concurrent weekly Taxol/carboplatin and radiation on 11/02/2014, radiation completed 12/09/2014  CTs 12/31/2014 revealed distal esophageal wall thickening with adjacent small lymph nodes suspicious for metastases, stable lung nodule posterior to the right bronchus intermedius and the persistent 5 mm nodule in the right lower lobe  Esophagectomy 02/01/2015 with the pathology confirming a ypT3,ypN3 tumor, positive distal margin  MSI-stable, tumor mutation burden-5  CT chest 07/17/2016-bilateral pleural effusions, Bibasilar airspace opacities with peribronchial vascular nodularity, new low-density right liver lesion  Left thoracentesis 07/19/2016-cytology showedreactive mesothelial cells; mixed lymphoid population  Ultrasound-guided biopsy of a segment  8 liver lesion on 08/01/2016-adenocarcinoma  Cycle 1 FOLFOX 09/06/2016  Cycle 2 FOLFOX 09/21/2016  Cycle 3 FOLFOX 10/05/2016  Cycle 4 FOLFOX 11/02/2016  Cycle 5 FOLFOX 11/20/2016  CT chest  12/01/2016-increased size of right liver lesion, persistent bilateral pleural effusions  Taxol and ramcirumab 12/12/2016; day 8 Taxol 12/19/2016   Cycle 2 Taxol and ramcirumab 01/09/2017, day 8 Taxol discontinued  Cycle 3 Taxol/ ramucirumab10/05/2016, Taxol dose reduced  Cycle 4 Taxol/ramucirumab 02/06/2017  Cycle 5 Taxol/ramucirumab 02/19/2017  Cycle 6 Taxol/ramucirumab 03/06/2017  CT chest 03/14/2017-reduced bilateral pleural effusions, right lower lobe airspace disease, unchanged right liver lesion  Cycle 7 Taxol/ramucirumab 03/19/2017  2. Prostatic hypertrophy-Status post TUR 01/26/2016  3. Hypertension  4. History of multiple basal cell skin cancers  5. History of thrombocytopenia secondary to chemotherapy  6. PVCs/bicuspid aortic valve-followed by cardiology  7. Port-A-Cath placement 09/06/2016  8. Neutropenia secondary to chemotherapy.  9. Bilateral pleural effusions, status post right thoracentesis 11/10/2016, left thoracentesis 11/15/2016  Cytology from 11/15/2016-negative  Cytology from 12/04/2016,12/06/2016, and 12/19/2016-negative  Placement of a left Pleurx catheter 01/03/2017  Placement of a right Pleurx catheter 01/16/2017  10.  Peripheral neuropathy-mild    Disposition: David [name] appears unchanged.  The restaging CT reveals no evidence of disease progression.  I discussed treatment options with David [name] and his wife.  Recommend continuing Taxol/ramucirumab.  He agrees.  We will refer him to Dr. Servando Snare to check the left Pleurx site.  He will begin a trial of Lasix for the anasarca.  Mr. Seufert will return for an office visit and chemotherapy in 2 weeks.  25 minutes were spent with the patient today.  The majority of the time was used for counseling and coordination of care.   Betsy Coder, MD  03/19/2017  10:05 AM

## 2017-03-19 NOTE — Patient Instructions (Signed)
[facility] Discharge Instructions for Patients Receiving Chemotherapy  Today you received the following chemotherapy agents Cyramza and Taxol  To help prevent nausea and vomiting after your treatment, we encourage you to take your nausea medication as directed.    If you develop nausea and vomiting that is not controlled by your nausea medication, call the clinic.   BELOW ARE SYMPTOMS THAT SHOULD BE REPORTED IMMEDIATELY:  *FEVER GREATER THAN 100.5 F  *CHILLS WITH OR WITHOUT FEVER  NAUSEA AND VOMITING THAT IS NOT CONTROLLED WITH YOUR NAUSEA MEDICATION  *UNUSUAL SHORTNESS OF BREATH  *UNUSUAL BRUISING OR BLEEDING  TENDERNESS IN MOUTH AND THROAT WITH OR WITHOUT PRESENCE OF ULCERS  *URINARY PROBLEMS  *BOWEL PROBLEMS  UNUSUAL RASH Items with * indicate a potential emergency and should be followed up as soon as possible.  Feel free to call the clinic should you have any questions or concerns. The clinic phone number is [phone].  Please show the CHEMO ALERT CARD at check-in to the Emergency Department and triage nurse.   

## 2017-03-19 NOTE — Telephone Encounter (Signed)
Scheduled appt per 11/26 los - patient is aware of appts added and is my chart active. Rn with Dr. Servando Snare to contact patietn and schedule f/u .

## 2017-03-20 ENCOUNTER — Ambulatory Visit (INDEPENDENT_AMBULATORY_CARE_PROVIDER_SITE_OTHER): Payer: Medicare Other | Admitting: Cardiothoracic Surgery

## 2017-03-20 ENCOUNTER — Encounter: Payer: Self-pay | Admitting: Cardiothoracic Surgery

## 2017-03-20 ENCOUNTER — Ambulatory Visit
Admission: RE | Admit: 2017-03-20 | Discharge: 2017-03-20 | Disposition: A | Discharge status: Home / Self Care | Payer: Medicare Other | Source: Ambulatory Visit | Attending: Cardiothoracic Surgery | Admitting: Cardiothoracic Surgery

## 2017-03-20 ENCOUNTER — Other Ambulatory Visit: Payer: Self-pay

## 2017-03-20 VITALS — BP 150/84 | HR 75 | Ht 75.0 in | Wt 180.0 lb

## 2017-03-20 DIAGNOSIS — C160 Malignant neoplasm of cardia: Secondary | ICD-10-CM | POA: Diagnosis not present

## 2017-03-20 DIAGNOSIS — J90 Pleural effusion, not elsewhere classified: Secondary | ICD-10-CM

## 2017-03-20 NOTE — Progress Notes (Signed)
[facility] [address]       [place] [address]             [phone]      Ivis A Fagin [place] Medical Record #[id number] Date of Birth: 1941/03/23  Referring: Ladell Pier, MD Primary Care: Marton Redwood, MD  Chief Complaint:   POST OP FOLLOW UP  History of Present Illness:     Patient comes in the office after placement of bilateral Pleurx catheters.  He has been draining the catheters each on a daily basis slightly more comes out of the right than the left, when the amount had been close to a liter, it is dropped to 400 mL a day from the right 200-250 from the left.  The patient's primary complaint is discomfort around the left Pleurx catheter insertion site  . Cancer Staging Cancer of lower third of esophagus ([place]) Staging form: Esophagus - Squamous Cell Carcinoma, AJCC 7th Edition - Clinical: Stage IIIB (T3, N2, M0) - Signed by Ladell Pier, MD on 10/19/2014 - Pathologic stage from 02/01/2015: Stage IIIC (T3, N3, cM0, G4 - Undifferentiated, Location: Middle) - Signed by Grace Isaac, MD on 02/04/2015    Past Medical History:  Diagnosis Date  . Allergy   . Anxiety   . Arthritis   . [name]'s esophagus   . Basal cell carcinoma    nose, face, back  . Dyspnea    reports due to fluid in lungs   . Dysrhythmia    skipped beats but not sure rhythm   . Enlarged prostate   . Esophageal cancer ([place])   . Food impaction of esophagus 08/20/2014  . GERD (gastroesophageal reflux disease)    needs 30* elevation since total esophagectomy -uses wedge at home  . Heart murmur   . History of kidney stones   . Hypertension   . Liver lesion   . Pleural effusion on left   . Radiation   . Urinary frequency   . Wears glasses      Social History   Tobacco Use  Smoking Status Former Smoker  . Last attempt to quit: 06/07/1961  . Years since quitting: 55.8  Smokeless Tobacco Never Used    Social History   Substance and Sexual Activity  Alcohol Use  No     Allergies  Allergen Reactions  . Adhesive [Tape] Dermatitis    Causes blisters and skin irritations  . Dilaudid [Hydromorphone Hcl] Hives and Itching  . Fentanyl Itching  . Nexium [Esomeprazole Magnesium] Other (See Comments)    Difficulty urinating and passing stool; dry mouth  . Other     Thinks it was oxycodone; made him hallucinate  . Oxycodone Nausea And Vomiting  . Statins Other (See Comments)    Leg cramping    Current Outpatient Medications  Medication Sig Dispense Refill  . ALPRAZolam (XANAX) 0.5 MG tablet Take 0.5 mg by mouth at bedtime.   4  . b complex vitamins capsule Take 1 capsule by mouth daily.    . cyproheptadine (PERIACTIN) 2 MG/5ML syrup Take 4 mg by mouth 4 (four) times daily. 10 ml prior to each meal & in the morning    . flecainide (TAMBOCOR) 100 MG tablet TAKE 1 TABLET (100 MG TOTAL) BY MOUTH 2 (TWO) TIMES DAILY. 60 tablet 8  . furosemide (LASIX) 20 MG tablet Take 1 tablet (20 mg total) by mouth 2 (two) times daily for 1 dose. 60 tablet 0  .  HYDROcodone-acetaminophen (NORCO/VICODIN) 5-325 MG tablet Take 1-2 tablets by mouth every 6 (six) hours as needed for moderate pain. 40 tablet 0  . metoprolol succinate (TOPROL-XL) 50 MG 24 hr tablet Take 50 mg by mouth daily.    . ondansetron (ZOFRAN) 8 MG tablet Take 1 tablet (8 mg total) by mouth every 8 (eight) hours as needed for nausea or vomiting. 30 tablet 2  . pantoprazole (PROTONIX) 20 MG tablet Take 1 tablet (20 mg total) by mouth daily. 30 tablet 1  . potassium chloride (MICRO-K) 10 MEQ CR capsule Take 1 capsule (10 mEq total) by mouth daily. 30 capsule 1  . Probiotic CAPS Take 1 capsule by mouth daily. Digestive    . prochlorperazine (COMPAZINE) 10 MG tablet Take 10 mg by mouth every 6 (six) hours as needed for nausea or vomiting.     No current facility-administered medications for this visit.    Facility-Administered Medications Ordered in Other Visits  Medication Dose Route Frequency Provider  Last Rate Last Dose  . sodium chloride flush (NS) 0.9 % injection 10 mL  10 mL Intravenous PRN Ladell Pier, MD   10 mL at 10/05/16 0933       Physical Exam: BP (!) 150/84 (BP Location: Left Arm, Patient Position: Sitting, Cuff Size: Normal)   Pulse 75   Ht 6\' 3"  (1.905 m)   Wt 180 lb (81.6 kg)   SpO2 95%   BMI 22.50 kg/m   General appearance: alert, cooperative, appears stated age and cachectic Head: Normocephalic, without obvious abnormality, atraumatic Neck: no adenopathy, no carotid bruit, no JVD, supple, symmetrical, trachea midline, thyroid not enlarged, symmetric, no tenderness/mass/nodules and Left neck incision is well-healed Lymph nodes: Cervical, supraclavicular, and axillary nodes normal. Resp: clear to auscultation bilaterally Back: symmetric, no curvature. ROM normal. No CVA tenderness. Cardio: regular rate and rhythm, S1, S2 normal, no murmur, click, rub or gallop GI: soft, non-tender; bowel sounds normal; no masses,  no organomegaly Extremities: extremities normal, atraumatic, no cyanosis or edema and Homans sign is negative, no sign of DVT Neurologic: Grossly normal There is some crusting around the insertion site of the left Pleurx catheter there is no obvious infection, the tunneled portion of the catheter is without any skin breakdown or irritation.  Diagnostic Studies & Laboratory data:     Recent Radiology Findings:   Dg Chest 2 View  Result Date: 03/20/2017 CLINICAL DATA:  Patient with a history of pleural effusions. Pleural drainage catheters place. EXAM: CHEST  2 VIEW COMPARISON:  CT chest 03/14/2017. Single-view of the chest 01/16/2017 FINDINGS: Bilateral pleural drainage catheters are in place. Trace amount of pleural fluid is seen bilaterally. No pneumothorax. Patchy bilateral lower lobe airspace opacities are seen correlating with findings on recent CT. Heart size is upper normal. No focal bony abnormality. IMPRESSION: Trace bilateral pleural  effusions with pleural drainage catheters in place. Negative pneumothorax. Patchy bilateral lower lobe airspace disease correlating with pneumonia seen on prior chest CT. Electronically Signed   By: Inge Rise M.D.   On: 03/20/2017 15:57      Recent Lab Findings: Lab Results  Component Value Date   WBC 2.4 (L) 03/19/2017   HGB 12.5 (L) 03/19/2017   HCT 38.9 03/19/2017   PLT 154 03/19/2017   GLUCOSE 86 03/19/2017   ALT 23 03/19/2017   AST 30 03/19/2017   NA 143 03/19/2017   K 3.8 03/19/2017   CL 105 01/16/2017   CREATININE 0.8 03/19/2017   BUN 30.4 (H) 03/19/2017  CO2 26 03/19/2017   TSH 2.737 01/09/2017   INR 0.98 01/16/2017      Assessment / Plan:      Recurrent bilateral pleural effusions, with history of esophageal cancer,, chest x-rays shows satisfactory drainage of his pleural space.  We cleaned around the left Pleurx catheter site remove the retention suture, he was instructed in local care around site.  Will begin draining the catheters every other day, trending the right one tomorrow and alternating sides every day.  Plan to see him back in 1 month with a follow-up chest x-ray     Grace Isaac MD      [address] [place] [address] Office [phone]   Beeper [phone]  03/20/2017 5:25 PM

## 2017-03-30 DIAGNOSIS — J918 Pleural effusion in other conditions classified elsewhere: Secondary | ICD-10-CM | POA: Diagnosis not present

## 2017-04-01 ENCOUNTER — Other Ambulatory Visit: Payer: Self-pay | Admitting: Oncology

## 2017-04-02 ENCOUNTER — Ambulatory Visit: Payer: Medicare Other

## 2017-04-02 ENCOUNTER — Other Ambulatory Visit: Payer: Medicare Other

## 2017-04-02 ENCOUNTER — Ambulatory Visit: Payer: Medicare Other | Admitting: Nurse Practitioner

## 2017-04-05 ENCOUNTER — Telehealth: Payer: Self-pay | Admitting: Oncology

## 2017-04-05 NOTE — Telephone Encounter (Signed)
Spoke to patient regarding upcoming December appointment updates per 12/12 sch message.

## 2017-04-06 ENCOUNTER — Ambulatory Visit (HOSPITAL_BASED_OUTPATIENT_CLINIC_OR_DEPARTMENT_OTHER): Payer: Medicare Other | Admitting: Nurse Practitioner

## 2017-04-06 ENCOUNTER — Encounter: Payer: Self-pay | Admitting: Nurse Practitioner

## 2017-04-06 ENCOUNTER — Ambulatory Visit (HOSPITAL_BASED_OUTPATIENT_CLINIC_OR_DEPARTMENT_OTHER): Payer: Medicare Other

## 2017-04-06 ENCOUNTER — Ambulatory Visit: Payer: Medicare Other

## 2017-04-06 ENCOUNTER — Other Ambulatory Visit (HOSPITAL_BASED_OUTPATIENT_CLINIC_OR_DEPARTMENT_OTHER): Payer: Medicare Other

## 2017-04-06 VITALS — BP 133/73 | HR 69 | Temp 97.7°F | Resp 18 | Ht 75.0 in | Wt 187.7 lb

## 2017-04-06 DIAGNOSIS — Z5112 Encounter for antineoplastic immunotherapy: Secondary | ICD-10-CM | POA: Diagnosis not present

## 2017-04-06 DIAGNOSIS — C155 Malignant neoplasm of lower third of esophagus: Secondary | ICD-10-CM | POA: Diagnosis not present

## 2017-04-06 DIAGNOSIS — I10 Essential (primary) hypertension: Secondary | ICD-10-CM

## 2017-04-06 DIAGNOSIS — Z79899 Other long term (current) drug therapy: Secondary | ICD-10-CM | POA: Diagnosis not present

## 2017-04-06 DIAGNOSIS — R601 Generalized edema: Secondary | ICD-10-CM

## 2017-04-06 DIAGNOSIS — Z95828 Presence of other vascular implants and grafts: Secondary | ICD-10-CM

## 2017-04-06 LAB — CBC WITH DIFFERENTIAL/PLATELET
BASO%: 1.8 % (ref 0.0–2.0)
BASOS ABS: 0.1 10*3/uL (ref 0.0–0.1)
EOS%: 4 % (ref 0.0–7.0)
Eosinophils Absolute: 0.1 10*3/uL (ref 0.0–0.5)
HCT: 39.1 % (ref 38.4–49.9)
HGB: 12.3 g/dL — ABNORMAL LOW (ref 13.0–17.1)
LYMPH%: 12.3 % — AB (ref 14.0–49.0)
MCH: 33.1 pg (ref 27.2–33.4)
MCHC: 31.5 g/dL — AB (ref 32.0–36.0)
MCV: 105.1 fL — AB (ref 79.3–98.0)
MONO#: 0.7 10*3/uL (ref 0.1–0.9)
MONO%: 24.5 % — ABNORMAL HIGH (ref 0.0–14.0)
NEUT#: 1.6 10*3/uL (ref 1.5–6.5)
NEUT%: 57.4 % (ref 39.0–75.0)
PLATELETS: 152 10*3/uL (ref 140–400)
RBC: 3.72 10*6/uL — AB (ref 4.20–5.82)
RDW: 14.1 % (ref 11.0–14.6)
WBC: 2.8 10*3/uL — ABNORMAL LOW (ref 4.0–10.3)
lymph#: 0.3 10*3/uL — ABNORMAL LOW (ref 0.9–3.3)

## 2017-04-06 LAB — COMPREHENSIVE METABOLIC PANEL
ALBUMIN: 1.8 g/dL — AB (ref 3.5–5.0)
ALK PHOS: 92 U/L (ref 40–150)
ALT: 16 U/L (ref 0–55)
AST: 25 U/L (ref 5–34)
Anion Gap: 7 mEq/L (ref 3–11)
BUN: 20.3 mg/dL (ref 7.0–26.0)
CO2: 29 meq/L (ref 22–29)
Calcium: 8.1 mg/dL — ABNORMAL LOW (ref 8.4–10.4)
Chloride: 108 mEq/L (ref 98–109)
Creatinine: 0.8 mg/dL (ref 0.7–1.3)
EGFR: >60 mL/min/{1.73_m2} (ref >60)
GLUCOSE: 88 mg/dL (ref 70–140)
POTASSIUM: 3.6 meq/L (ref 3.5–5.1)
SODIUM: 143 meq/L (ref 136–145)
TOTAL PROTEIN: 4.9 g/dL — AB (ref 6.4–8.3)
Total Bilirubin: 0.37 mg/dL (ref 0.20–1.20)

## 2017-04-06 LAB — UA PROTEIN, DIPSTICK - CHCC: Protein, ur: NEGATIVE mg/dL

## 2017-04-06 LAB — CEA (IN HOUSE-CHCC): CEA (CHCC-In House): 5.71 ng/mL — ABNORMAL HIGH (ref 0.00–5.00)

## 2017-04-06 MED ORDER — FAMOTIDINE IN NACL 20-0.9 MG/50ML-% IV SOLN
INTRAVENOUS | Status: AC
  Filled 2017-04-06: qty 50

## 2017-04-06 MED ORDER — DEXAMETHASONE SODIUM PHOSPHATE 10 MG/ML IJ SOLN
10.0000 mg | Freq: Once | INTRAMUSCULAR | Status: AC
  Administered 2017-04-06: 10 mg via INTRAVENOUS

## 2017-04-06 MED ORDER — SODIUM CHLORIDE 0.9% FLUSH
10.0000 mL | Freq: Once | INTRAVENOUS | Status: AC
  Administered 2017-04-06: 10 mL
  Filled 2017-04-06: qty 10

## 2017-04-06 MED ORDER — FAMOTIDINE IN NACL 20-0.9 MG/50ML-% IV SOLN
20.0000 mg | Freq: Once | INTRAVENOUS | Status: AC
  Administered 2017-04-06: 20 mg via INTRAVENOUS

## 2017-04-06 MED ORDER — SODIUM CHLORIDE 0.9 % IV SOLN
600.0000 mg | Freq: Once | INTRAVENOUS | Status: AC
  Administered 2017-04-06: 600 mg via INTRAVENOUS
  Filled 2017-04-06: qty 50

## 2017-04-06 MED ORDER — DEXAMETHASONE SODIUM PHOSPHATE 10 MG/ML IJ SOLN
INTRAMUSCULAR | Status: AC
  Filled 2017-04-06: qty 1

## 2017-04-06 MED ORDER — HEPARIN SOD (PORK) LOCK FLUSH 100 UNIT/ML IV SOLN
500.0000 [IU] | Freq: Once | INTRAVENOUS | Status: AC | PRN
  Administered 2017-04-06: 500 [IU]
  Filled 2017-04-06: qty 5

## 2017-04-06 MED ORDER — DIPHENHYDRAMINE HCL 50 MG/ML IJ SOLN
25.0000 mg | Freq: Once | INTRAMUSCULAR | Status: AC
  Administered 2017-04-06: 25 mg via INTRAVENOUS

## 2017-04-06 MED ORDER — DIPHENHYDRAMINE HCL 50 MG/ML IJ SOLN
INTRAMUSCULAR | Status: AC
  Filled 2017-04-06: qty 1

## 2017-04-06 MED ORDER — SODIUM CHLORIDE 0.9% FLUSH
10.0000 mL | INTRAVENOUS | Status: DC | PRN
  Administered 2017-04-06: 10 mL
  Filled 2017-04-06: qty 10

## 2017-04-06 MED ORDER — ACETAMINOPHEN 325 MG PO TABS
650.0000 mg | ORAL_TABLET | Freq: Once | ORAL | Status: AC
  Administered 2017-04-06: 650 mg via ORAL

## 2017-04-06 MED ORDER — ACETAMINOPHEN 325 MG PO TABS
ORAL_TABLET | ORAL | Status: AC
  Filled 2017-04-06: qty 2

## 2017-04-06 MED ORDER — POTASSIUM CHLORIDE CRYS ER 20 MEQ PO TBCR: 20.0000 meq | EXTENDED_RELEASE_TABLET | Freq: Every day | ORAL | 1 refills | Status: AC

## 2017-04-06 MED ORDER — SODIUM CHLORIDE 0.9 % IV SOLN
Freq: Once | INTRAVENOUS | Status: AC
  Administered 2017-04-06: 11:00:00 via INTRAVENOUS

## 2017-04-06 MED ORDER — FUROSEMIDE 20 MG PO TABS: 40.0000 mg | ORAL_TABLET | Freq: Two times a day (BID) | ORAL | 1 refills | Status: DC

## 2017-04-06 NOTE — Patient Instructions (Signed)
[facility] Discharge Instructions for Patients Receiving Chemotherapy  Today you received the following chemotherapy agents Ramucirumab.  To help prevent nausea and vomiting after your treatment, we encourage you to take your nausea medication as directed.   If you develop nausea and vomiting that is not controlled by your nausea medication, call the clinic.   BELOW ARE SYMPTOMS THAT SHOULD BE REPORTED IMMEDIATELY:  *FEVER GREATER THAN 100.5 F  *CHILLS WITH OR WITHOUT FEVER  NAUSEA AND VOMITING THAT IS NOT CONTROLLED WITH YOUR NAUSEA MEDICATION  *UNUSUAL SHORTNESS OF BREATH  *UNUSUAL BRUISING OR BLEEDING  TENDERNESS IN MOUTH AND THROAT WITH OR WITHOUT PRESENCE OF ULCERS  *URINARY PROBLEMS  *BOWEL PROBLEMS  UNUSUAL RASH Items with * indicate a potential emergency and should be followed up as soon as possible.  Feel free to call the clinic should you have any questions or concerns. The clinic phone number is [phone].  Please show the [facility] at check-in to the Emergency Department and triage nurse.

## 2017-04-06 NOTE — Progress Notes (Addendum)
[facility] OFFICE PROGRESS NOTE   Diagnosis: Esophagus cancer  INTERVAL HISTORY:   David [name] returns as scheduled.  He completed another cycle of Taxol/ramucirumab 03/19/2017.  He denies nausea/vomiting.  No mouth sores.  No diarrhea.  He continues to note generalized edema.  He noted an increase in right arm edema yesterday, better today.  He has pain on his left side/abdomen with ambulation.  The pain does not radiate.  He has persistent numbness in the fingertips and over the soles of both feet.  He has some difficulty with buttoning his shirt.  No difficulty with walking.  Objective:  Vital signs in last 24 hours:  Blood pressure 133/73, pulse 69, temperature 97.7 F (36.5 C), temperature source Oral, resp. rate 18, height 6' 3" (1.905 m), weight 187 lb 11.2 oz (85.1 kg), SpO2 97 %.    HEENT: Mouth appears dry.  No thrush or ulcers. Resp: Lungs clear bilaterally.  Bilateral Pleurx catheters. Cardio: Regular rate and rhythm. GI: No hepatomegaly. Vascular: Pitting edema at the sacrum, throughout both legs.  Trace edema bilateral lower arms/hands right greater than left. Neuro: Vibratory sense intact over the fingertips for tuning fork exam. Skin: Palms with a dry appearance.  No skin breakdown. Port-A-Cath without erythema  Lab Results:  Lab Results  Component Value Date   WBC 2.8 (L) 04/06/2017   HGB 12.3 (L) 04/06/2017   HCT 39.1 04/06/2017   MCV 105.1 (H) 04/06/2017   PLT 152 04/06/2017   NEUTROABS 1.6 04/06/2017    Imaging:  No results found.  Medications: I have reviewed the patient's current medications.  Assessment/Plan: 1. Adenocarcinoma of the distal esophagus,[id number]  Presenting with a food impaction and dysphagia 08/20/2014  Staging CT scans of the chest, abdomen, and pelvis 10/06/2014 with indeterminate pulmonary nodules, a 9 mm paraesophageal lymph node, and circumferential thickening of the distal esophagus  PET scan 10/23/2014  confirmed hypermetabolic thickening at the distal esophagus with small paraesophageal nodes having faint nonspecific activity. A pleural-based focus of consolidation at the posterior right lower lobe had low metabolic activity-favored to be a benign process.  Initiation of concurrent weekly Taxol/carboplatin and radiation on 11/02/2014, radiation completed 12/09/2014  CTs 12/31/2014 revealed distal esophageal wall thickening with adjacent small lymph nodes suspicious for metastases, stable lung nodule posterior to the right bronchus intermedius and the persistent 5 mm nodule in the right lower lobe  Esophagectomy 02/01/2015 with the pathology confirming a ypT3,ypN3 tumor, positive distal margin  MSI-stable, tumor mutation burden-5  CT chest 07/17/2016-bilateral pleural effusions, Bibasilar airspace opacities with peribronchial vascular nodularity, new low-density right liver lesion  Left thoracentesis 07/19/2016-cytology showedreactive mesothelial cells; mixed lymphoid population  Ultrasound-guided biopsy of a segment 8 liver lesion on 08/01/2016-adenocarcinoma  Cycle 1 FOLFOX 09/06/2016  Cycle 2 FOLFOX 09/21/2016  Cycle 3 FOLFOX 10/05/2016  Cycle 4 FOLFOX 11/02/2016  Cycle 5 FOLFOX 11/20/2016  CT chest 12/01/2016-increased size of right liver lesion, persistent bilateral pleural effusions  Taxol and ramcirumab 12/12/2016; day 8 Taxol 12/19/2016   Cycle 2 Taxol and ramcirumab 01/09/2017, day 8 Taxol discontinued  Cycle 3 Taxol/ ramucirumab10/05/2016, Taxol dose reduced  Cycle 4 Taxol/ramucirumab 02/06/2017  Cycle 5 Taxol/ramucirumab 02/19/2017  Cycle 6 Taxol/ramucirumab11/13/2018  CT chest 03/14/2017-reduced bilateral pleural effusions, right lower lobe airspace disease, unchanged right liver lesion  Cycle 7 Taxol/ramucirumab 03/19/2017  Cycle 8 ramucirumab 04/06/2017 (Taxol held due to neuropathy)  2. Prostatic hypertrophy-Status post TUR 01/26/2016  3.  Hypertension  4. History of multiple basal cell skin cancers  5. History of thrombocytopenia secondary to chemotherapy  6. PVCs/bicuspid aortic valve-followed by cardiology  7. Port-A-Cath placement 09/06/2016  8. Neutropenia secondary to chemotherapy.  9. Bilateral pleural effusions, status post right thoracentesis 11/10/2016, left thoracentesis 11/15/2016  Cytology from 11/15/2016-negative  Cytology from 12/04/2016,12/06/2016, and 12/19/2016-negative  Placement of a left Pleurx catheter 01/03/2017  Placement of a right Pleurx catheter 01/16/2017  10.  Peripheral neuropathy- progressive 04/06/2017.  Taxol held.   Disposition: David [name] appears stable.  He has completed 8 cycles of Taxol/ramucirumab.  He has progressive neuropathy symptoms in the hands and feet.  We will hold Taxol with today's treatment, proceed with ramucirumab as scheduled.  He has persistent anasarca.  No significant improvement with initial trial of Lasix.  He will increase the Lasix dose to 40 mg twice daily.  He will return for a follow-up visit and treatment on 04/27/2016.  He will contact the office in the interim with any problems.  Patient seen with Dr. Benay Spice.  25 minutes were spent face-to-face at today's visit with the majority of that time involved in counseling/coordination of care.    Ned Card ANP/GNP-BC   04/06/2017  9:33 AM  This was a shared visit with Ned Card.  David [name] was interviewed and examined.  He has persistent anasarca.  We increased the furosemide dose.  Taxol will be placed on hold secondary to progressive neuropathy symptoms.  He will complete another treatment with ramucirumab today.  Julieanne Manson, MD

## 2017-04-07 ENCOUNTER — Telehealth: Payer: Self-pay | Admitting: Oncology

## 2017-04-07 NOTE — Telephone Encounter (Signed)
Spoke with patient re next appointment for 1/4 - date per 12/14 los.

## 2017-04-09 ENCOUNTER — Other Ambulatory Visit: Payer: Self-pay | Admitting: *Deleted

## 2017-04-09 DIAGNOSIS — C155 Malignant neoplasm of lower third of esophagus: Secondary | ICD-10-CM

## 2017-04-09 DIAGNOSIS — J910 Malignant pleural effusion: Secondary | ICD-10-CM | POA: Diagnosis not present

## 2017-04-09 MED ORDER — PANTOPRAZOLE SODIUM 20 MG PO TBEC: 20.0000 mg | DELAYED_RELEASE_TABLET | Freq: Every day | ORAL | 5 refills | Status: AC

## 2017-04-09 NOTE — Telephone Encounter (Signed)
Fax from [facility], pt called after hours for refill. Script escribed to pharmacy.

## 2017-04-13 ENCOUNTER — Telehealth: Payer: Self-pay | Admitting: Medical Oncology

## 2017-04-13 NOTE — Telephone Encounter (Signed)
Feels lousy last 3 nights, weak," sickly", nausea, thirsty,pain in back "over kidneys". He is drinking lots of fluids, Denies dysuria, urine is straw colored and clear. Stated swelling in legs looks better. He took 40 lasix today and it is ordered bid.  Action-Per Dr Benay Spice  instructed pt to stop lasix . Continue drinking extra fluids , go to ED if symptoms worsen. I also instructed him to call us Monday with update.

## 2017-04-16 ENCOUNTER — Telehealth: Payer: Self-pay | Admitting: *Deleted

## 2017-04-16 DIAGNOSIS — C155 Malignant neoplasm of lower third of esophagus: Secondary | ICD-10-CM

## 2017-04-16 MED ORDER — HYDROCODONE-ACETAMINOPHEN 5-325 MG PO TABS: 1.0000 | ORAL_TABLET | Freq: Four times a day (QID) | ORAL | 0 refills | Status: DC | PRN

## 2017-04-16 NOTE — Telephone Encounter (Signed)
Returned call to pt, he reports flank pain is 2/10 right now after taking Hydrocodone. 8/10 prior to taking pain med. Pt reports flank pain is similar to when he had kidney stones in the past. He has discontinued Lasix noticed no improvement in pain but nausea is better. Reviewed with Dr. Benay Spice: Have pt contact urology for evaluation of flank pain. Go to ED for severe pain. Pt voiced understanding, requested we make referral to urology so he can get in sooner.  [facility], they are closed today. Pt instructed to contact them on 04/18/17. He will pick up Hydrocodone script from this office today.

## 2017-04-18 DIAGNOSIS — R109 Unspecified abdominal pain: Secondary | ICD-10-CM | POA: Diagnosis not present

## 2017-04-18 DIAGNOSIS — N200 Calculus of kidney: Secondary | ICD-10-CM | POA: Diagnosis not present

## 2017-04-19 ENCOUNTER — Encounter: Payer: Self-pay | Admitting: Nurse Practitioner

## 2017-04-19 ENCOUNTER — Telehealth: Payer: Self-pay

## 2017-04-19 ENCOUNTER — Encounter: Payer: Self-pay | Admitting: Oncology

## 2017-04-19 NOTE — Telephone Encounter (Signed)
Patient called regarding recent Urology findings. Patient stated that "they found kidney stones on both sides". He states that the on-call MD, Link Snuffer, recommended that he have the stones in the left kidney surgically removed. Patient wants Dr. Benay Spice to talk to the urologist to make sure everyone is "on the same page".

## 2017-04-20 ENCOUNTER — Telehealth: Payer: Self-pay

## 2017-04-20 NOTE — Telephone Encounter (Addendum)
[facility] to get results from recent CT and ultrasound. Holly in medical records stated that patient will need to sign a release of information form through our office or thiers before they can fax the results.   Attempted to contact patient, unable to leave voicemail.   Spoke with patient and informed him of above message from [facility]. Patient states "I spoke with Dr. Gloriann Loan this morning and he said he would get the results to Dr. Benay Spice". Patient stated he is going to call [facility] back. Patient knows to call back with any further information.

## 2017-04-20 NOTE — Telephone Encounter (Signed)
err

## 2017-04-23 ENCOUNTER — Other Ambulatory Visit: Payer: Self-pay | Admitting: Urology

## 2017-04-23 ENCOUNTER — Telehealth: Payer: Self-pay

## 2017-04-23 NOTE — Telephone Encounter (Signed)
Patient called to report that Dr. Link Snuffer with [facility] sent CT and ultrasound results to our office. Instructed patient that I would verify that they were received and provide report for David [name] and Dr. Benay Spice. Patient voiced understanding.

## 2017-04-25 ENCOUNTER — Other Ambulatory Visit: Payer: Self-pay | Admitting: Urology

## 2017-04-25 ENCOUNTER — Other Ambulatory Visit: Payer: Self-pay

## 2017-04-25 ENCOUNTER — Telehealth: Payer: Self-pay | Admitting: *Deleted

## 2017-04-25 ENCOUNTER — Encounter (HOSPITAL_COMMUNITY): Payer: Self-pay | Admitting: *Deleted

## 2017-04-25 NOTE — Progress Notes (Signed)
Bilateral Pleurx catheters in place- placed 12/2016 by dr Servando Snare.  Wife hooks up every other nite to bottles to drain.  Patient states the right one drainx approx 1000cc every other nite while the left one drains 250-400c every other nite.   Caps on ends of catheters during day with dressing    Patient sleeps with hob elevated 40-45 degrees per patietn.  Due to acid reflux.

## 2017-04-25 NOTE — Telephone Encounter (Signed)
Patient called concerned over the move from surgery center to hospital. He is also concerned that Dr. Benay Spice and urologists are communicating. Dr. Learta Codding would like to see patient prior to procedure on Friday appt . Shceduled

## 2017-04-26 ENCOUNTER — Other Ambulatory Visit (HOSPITAL_BASED_OUTPATIENT_CLINIC_OR_DEPARTMENT_OTHER): Payer: Medicare Other

## 2017-04-26 ENCOUNTER — Encounter: Payer: Medicare Other | Admitting: Cardiothoracic Surgery

## 2017-04-26 ENCOUNTER — Inpatient Hospital Stay: Payer: Medicare Other | Attending: Oncology | Admitting: Oncology

## 2017-04-26 VITALS — BP 160/79 | HR 66 | Temp 97.7°F | Resp 20 | Ht 75.0 in | Wt 184.5 lb

## 2017-04-26 DIAGNOSIS — R110 Nausea: Secondary | ICD-10-CM | POA: Insufficient documentation

## 2017-04-26 DIAGNOSIS — N200 Calculus of kidney: Secondary | ICD-10-CM | POA: Insufficient documentation

## 2017-04-26 DIAGNOSIS — Z85828 Personal history of other malignant neoplasm of skin: Secondary | ICD-10-CM | POA: Insufficient documentation

## 2017-04-26 DIAGNOSIS — Z9221 Personal history of antineoplastic chemotherapy: Secondary | ICD-10-CM | POA: Insufficient documentation

## 2017-04-26 DIAGNOSIS — I10 Essential (primary) hypertension: Secondary | ICD-10-CM | POA: Insufficient documentation

## 2017-04-26 DIAGNOSIS — J90 Pleural effusion, not elsewhere classified: Secondary | ICD-10-CM | POA: Insufficient documentation

## 2017-04-26 DIAGNOSIS — G629 Polyneuropathy, unspecified: Secondary | ICD-10-CM | POA: Insufficient documentation

## 2017-04-26 DIAGNOSIS — Z79899 Other long term (current) drug therapy: Secondary | ICD-10-CM | POA: Insufficient documentation

## 2017-04-26 DIAGNOSIS — R600 Localized edema: Secondary | ICD-10-CM | POA: Insufficient documentation

## 2017-04-26 DIAGNOSIS — M545 Low back pain: Secondary | ICD-10-CM | POA: Insufficient documentation

## 2017-04-26 DIAGNOSIS — C787 Secondary malignant neoplasm of liver and intrahepatic bile duct: Secondary | ICD-10-CM | POA: Insufficient documentation

## 2017-04-26 DIAGNOSIS — C155 Malignant neoplasm of lower third of esophagus: Secondary | ICD-10-CM | POA: Diagnosis not present

## 2017-04-26 DIAGNOSIS — Z95828 Presence of other vascular implants and grafts: Secondary | ICD-10-CM | POA: Insufficient documentation

## 2017-04-26 DIAGNOSIS — Z923 Personal history of irradiation: Secondary | ICD-10-CM | POA: Insufficient documentation

## 2017-04-26 DIAGNOSIS — D701 Agranulocytosis secondary to cancer chemotherapy: Secondary | ICD-10-CM | POA: Insufficient documentation

## 2017-04-26 DIAGNOSIS — R630 Anorexia: Secondary | ICD-10-CM | POA: Insufficient documentation

## 2017-04-26 DIAGNOSIS — R1012 Left upper quadrant pain: Secondary | ICD-10-CM | POA: Insufficient documentation

## 2017-04-26 DIAGNOSIS — T451X5S Adverse effect of antineoplastic and immunosuppressive drugs, sequela: Secondary | ICD-10-CM | POA: Insufficient documentation

## 2017-04-26 DIAGNOSIS — R1011 Right upper quadrant pain: Secondary | ICD-10-CM | POA: Insufficient documentation

## 2017-04-26 LAB — COMPREHENSIVE METABOLIC PANEL
ALT: 17 U/L (ref 0–55)
AST: 26 U/L (ref 5–34)
Albumin: 2 g/dL — ABNORMAL LOW (ref 3.5–5.0)
Alkaline Phosphatase: 119 U/L (ref 40–150)
Anion Gap: 6 mEq/L (ref 3–11)
BUN: 24.4 mg/dL (ref 7.0–26.0)
CHLORIDE: 103 meq/L (ref 98–109)
CO2: 32 mEq/L — ABNORMAL HIGH (ref 22–29)
Calcium: 8.3 mg/dL — ABNORMAL LOW (ref 8.4–10.4)
Creatinine: 0.8 mg/dL (ref 0.7–1.3)
GLUCOSE: 87 mg/dL (ref 70–140)
POTASSIUM: 4.6 meq/L (ref 3.5–5.1)
SODIUM: 141 meq/L (ref 136–145)
Total Bilirubin: 0.39 mg/dL (ref 0.20–1.20)
Total Protein: 5.5 g/dL — ABNORMAL LOW (ref 6.4–8.3)

## 2017-04-26 LAB — CBC WITH DIFFERENTIAL/PLATELET
BASO%: 0.5 % (ref 0.0–2.0)
Basophils Absolute: 0 10*3/uL (ref 0.0–0.1)
EOS ABS: 0.1 10*3/uL (ref 0.0–0.5)
EOS%: 2 % (ref 0.0–7.0)
HEMATOCRIT: 43.2 % (ref 38.4–49.9)
HGB: 13.7 g/dL (ref 13.0–17.1)
LYMPH%: 11.2 % — AB (ref 14.0–49.0)
MCH: 33.6 pg — ABNORMAL HIGH (ref 27.2–33.4)
MCHC: 31.7 g/dL — ABNORMAL LOW (ref 32.0–36.0)
MCV: 105.9 fL — AB (ref 79.3–98.0)
MONO#: 0.5 10*3/uL (ref 0.1–0.9)
MONO%: 12.9 % (ref 0.0–14.0)
NEUT%: 73.4 % (ref 39.0–75.0)
NEUTROS ABS: 2.9 10*3/uL (ref 1.5–6.5)
PLATELETS: 158 10*3/uL (ref 140–400)
RBC: 4.08 10*6/uL — AB (ref 4.20–5.82)
RDW: 13.6 % (ref 11.0–14.6)
WBC: 3.9 10*3/uL — AB (ref 4.0–10.3)
lymph#: 0.4 10*3/uL — ABNORMAL LOW (ref 0.9–3.3)

## 2017-04-26 NOTE — Progress Notes (Signed)
[place] OFFICE PROGRESS NOTE   Diagnosis: Esophagus cancer  INTERVAL HISTORY:   David [name] was last treated with ramucirumab 04/06/2017.  He did not receive Taxol that day.  He reports improvement in neuropathy symptoms. For the past 2 weeks he has experienced bilateral flank pain.  The pain is similar to when he had a kidney stone in the past.  The pain is partially relieved with hydrocodone.  He has intermittent nausea.  No fever or dysuria. He saw Dr. Gloriann Loan on 04/18/2017.  A CT urogram revealed bilateral renal calculi including a 1 cm calculus at the left ureteropelvic junction, no hydronephrosis.  A 9 mm calculus was also seen in the right renal pelvis.  No ureterolithiasis or obstructive uropathy.  The CT also showed unchanged bibasilar pulmonary nodularity, slight enlargement of a right liver lesion now measuring 4.8 cm compared to 4.4 cm on the 03/14/2017 CT.  No retroperitoneal or periportal lymphadenopathy.  No bone lesions.  He is scheduled for removal of the left kidney stone tomorrow.  He is scheduled for a left ureteroscopy with laser lithotripsy and ureteral stent placement.  He reports constipation since beginning hydrocodone.  He takes MiraLAX intermittently.  Objective:  Vital signs in last 24 hours:  Blood pressure (!) 160/79, pulse 66, temperature 97.7 F (36.5 C), temperature source Oral, resp. rate 20, height '6\' 3"'  (1.905 m), weight 184 lb 8 oz (83.7 kg), SpO2 96 %.     Resp: Decreased breath sounds of the right greater than left chest, respiratory/expiratory rub heard throughout the right posterior chest and left anterior chest, no respiratory distress Cardio: Regular rate and rhythm GI: No hepatosplenomegaly, no mass, nontender Vascular: Pitting edema at the lower leg bilaterally Musculoskeletal: Mild flank tenderness bilaterally, no mass   Portacath/PICC-without erythema  Lab Results:  Lab Results  Component Value Date   WBC 3.9 (L)  04/26/2017   HGB 13.7 04/26/2017   HCT 43.2 04/26/2017   MCV 105.9 (H) 04/26/2017   PLT 158 04/26/2017   NEUTROABS 2.9 04/26/2017    CMP     Component Value Date/Time   NA 141 04/26/2017 1535   K 4.6 04/26/2017 1535   CL 105 01/16/2017 0646   CO2 32 (H) 04/26/2017 1535   GLUCOSE 87 04/26/2017 1535   BUN 24.4 04/26/2017 1535   CREATININE 0.8 04/26/2017 1535   CALCIUM 8.3 (L) 04/26/2017 1535   PROT 5.5 (L) 04/26/2017 1535   ALBUMIN 2.0 (L) 04/26/2017 1535   AST 26 04/26/2017 1535   ALT 17 04/26/2017 1535   ALKPHOS 119 04/26/2017 1535   BILITOT 0.39 04/26/2017 1535   GFRNONAA >60 01/16/2017 0646   GFRAA >60 01/16/2017 0646    Lab Results  Component Value Date   CEA1 5.71 (H) 04/06/2017    Lab Results  Component Value Date   INR 0.98 01/16/2017    Imaging: CT images from 04/18/2017 reviewed Medications: I have reviewed the patient's current medications.   Assessment/Plan: 1. Adenocarcinoma of the distal esophagus,uT3uN2  Presenting with a food impaction and dysphagia 08/20/2014  Staging CT scans of the chest, abdomen, and pelvis 10/06/2014 with indeterminate pulmonary nodules, a 9 mm paraesophageal lymph node, and circumferential thickening of the distal esophagus  PET scan 10/23/2014 confirmed hypermetabolic thickening at the distal esophagus with small paraesophageal nodes having faint nonspecific activity. A pleural-based focus of consolidation at the posterior right lower lobe had low metabolic activity-favored to be a benign process.  Initiation of concurrent weekly Taxol/carboplatin and  radiation on 11/02/2014, radiation completed 12/09/2014  CTs 12/31/2014 revealed distal esophageal wall thickening with adjacent small lymph nodes suspicious for metastases, stable lung nodule posterior to the right bronchus intermedius and the persistent 5 mm nodule in the right lower lobe  Esophagectomy 02/01/2015 with the pathology confirming a ypT3,ypN3 tumor, positive  distal margin  MSI-stable, tumor mutation burden-5  CT chest 07/17/2016-bilateral pleural effusions, Bibasilar airspace opacities with peribronchial vascular nodularity, new low-density right liver lesion  Left thoracentesis 07/19/2016-cytology showedreactive mesothelial cells; mixed lymphoid population  Ultrasound-guided biopsy of a segment 8 liver lesion on 08/01/2016-adenocarcinoma  Cycle 1 FOLFOX 09/06/2016  Cycle 2 FOLFOX 09/21/2016  Cycle 3 FOLFOX 10/05/2016  Cycle 4 FOLFOX 11/02/2016  Cycle 5 FOLFOX 11/20/2016  CT chest 12/01/2016-increased size of right liver lesion, persistent bilateral pleural effusions  Taxol and ramcirumab 12/12/2016; day 8 Taxol 12/19/2016   Cycle 2 Taxol and ramcirumab 01/09/2017, day 8 Taxol discontinued  Cycle 3 Taxol/ ramucirumab10/05/2016, Taxol dose reduced  Cycle 4 Taxol/ramucirumab 02/06/2017  Cycle 5 Taxol/ramucirumab 02/19/2017  Cycle 6 Taxol/ramucirumab11/13/2018  CT chest 03/14/2017-reduced bilateral pleural effusions, right lower lobe airspace disease, unchanged right liver lesion  Cycle 7 Taxol/ramucirumab 03/19/2017  Cycle 8 ramucirumab 04/06/2017 (Taxol held due to neuropathy)  CT, urology center, 04/18/2017- slight increase in right hepatic metastasis, unchanged bibasilar pulmonary nodularity, bilateral pleural effusions, bilateral nonobstructing renal calculi  2. Prostatic hypertrophy-Status post TUR 01/26/2016  3. Hypertension  4. History of multiple basal cell skin cancers  5. History of thrombocytopenia secondary to chemotherapy  6. PVCs/bicuspid aortic valve-followed by cardiology  7. Port-A-Cath placement 09/06/2016  8. Neutropenia secondary to chemotherapy.  9. Bilateral pleural effusions, status post right thoracentesis 11/10/2016, left thoracentesis 11/15/2016  Cytology from 11/15/2016-negative  Cytology from 12/04/2016,12/06/2016, and 12/19/2016-negative  Placement of a left  Pleurx catheter 01/03/2017  Placement of a right Pleurx catheter 01/16/2017  10.Peripheral neuropathy- progressive 04/06/2017.  Taxol held. 11.  Bilateral renal pelvis stones confirmed on CT 04/18/2017 12.  Bilateral flank pain beginning December 2018- etiology unclear  Disposition: Mr. Sarver has metastatic esophagus cancer.  He is currently being treated with Taxol/ramucirumab.  He has developed bilateral flank pain for the past few weeks.  The etiology of the pain is unclear.  I suspect the pain is related to pleural-based disease in the chest, though it is possible the pain is related to renal stones.  He is scheduled for removal of a left renal pelvis stone tomorrow.  He will continue hydrocodone every 4 hours as needed.  He will begin scheduled MiraLAX and Senokot.  Mr. Graffam will return for office visit with the plan to resume Taxol/ramucirumab on 05/02/2017.  I will contact Dr. Gloriann Loan to discuss the case prior to the planned surgery 04/27/2017.  40 minutes were spent with the patient today.  The majority of the time was used for counseling and coordination of care.  Betsy Coder, MD  04/26/2017  4:35 PM

## 2017-04-26 NOTE — Progress Notes (Signed)
Dr Linna Caprice Claris Gower) made aware of patient history of bilateral pleural effusions with bilateral Pleurx catheters in place along with other medical history.  Order given per Dr Linna Caprice to repeat CXR am of surgery.  Order placed in epic.

## 2017-04-27 ENCOUNTER — Ambulatory Visit (HOSPITAL_COMMUNITY): Admission: RE | Admit: 2017-04-27 | Payer: Medicare Other | Source: Ambulatory Visit | Admitting: Urology

## 2017-04-27 ENCOUNTER — Ambulatory Visit: Payer: Medicare Other | Admitting: Oncology

## 2017-04-27 ENCOUNTER — Other Ambulatory Visit: Payer: Medicare Other

## 2017-04-27 ENCOUNTER — Ambulatory Visit: Payer: Medicare Other

## 2017-04-27 HISTORY — DX: Edema, unspecified: R60.9

## 2017-04-27 HISTORY — DX: Myoneural disorder, unspecified: G70.9

## 2017-04-27 HISTORY — DX: Personal history of antineoplastic chemotherapy: Z92.21

## 2017-04-27 HISTORY — DX: Pleural effusion, not elsewhere classified: J90

## 2017-04-27 HISTORY — DX: Presence of other vascular implants and grafts: Z95.828

## 2017-04-27 SURGERY — CYSTOURETEROSCOPY, WITH RETROGRADE PYELOGRAM AND STENT INSERTION
Anesthesia: General | Laterality: Left

## 2017-04-30 ENCOUNTER — Telehealth: Payer: Self-pay | Admitting: Oncology

## 2017-04-30 NOTE — Telephone Encounter (Signed)
Scheduled per 1/3 los- Patient is aware of appt date and time.

## 2017-05-01 ENCOUNTER — Telehealth: Payer: Self-pay | Admitting: *Deleted

## 2017-05-01 ENCOUNTER — Inpatient Hospital Stay (HOSPITAL_BASED_OUTPATIENT_CLINIC_OR_DEPARTMENT_OTHER): Payer: Medicare Other | Admitting: Oncology

## 2017-05-01 ENCOUNTER — Encounter: Payer: Self-pay | Admitting: Oncology

## 2017-05-01 ENCOUNTER — Inpatient Hospital Stay: Payer: Medicare Other

## 2017-05-01 ENCOUNTER — Telehealth: Payer: Self-pay | Admitting: Oncology

## 2017-05-01 VITALS — BP 158/83 | HR 64 | Temp 97.8°F | Resp 20 | Ht 75.0 in | Wt 179.9 lb

## 2017-05-01 DIAGNOSIS — I10 Essential (primary) hypertension: Secondary | ICD-10-CM

## 2017-05-01 DIAGNOSIS — C155 Malignant neoplasm of lower third of esophagus: Secondary | ICD-10-CM | POA: Diagnosis not present

## 2017-05-01 DIAGNOSIS — M545 Low back pain: Secondary | ICD-10-CM

## 2017-05-01 DIAGNOSIS — Z9221 Personal history of antineoplastic chemotherapy: Secondary | ICD-10-CM

## 2017-05-01 DIAGNOSIS — J90 Pleural effusion, not elsewhere classified: Secondary | ICD-10-CM

## 2017-05-01 DIAGNOSIS — T451X5S Adverse effect of antineoplastic and immunosuppressive drugs, sequela: Secondary | ICD-10-CM | POA: Diagnosis not present

## 2017-05-01 DIAGNOSIS — R600 Localized edema: Secondary | ICD-10-CM | POA: Diagnosis not present

## 2017-05-01 DIAGNOSIS — G629 Polyneuropathy, unspecified: Secondary | ICD-10-CM

## 2017-05-01 DIAGNOSIS — Z923 Personal history of irradiation: Secondary | ICD-10-CM | POA: Diagnosis not present

## 2017-05-01 DIAGNOSIS — Z79899 Other long term (current) drug therapy: Secondary | ICD-10-CM

## 2017-05-01 DIAGNOSIS — N200 Calculus of kidney: Secondary | ICD-10-CM

## 2017-05-01 DIAGNOSIS — Z95828 Presence of other vascular implants and grafts: Secondary | ICD-10-CM | POA: Diagnosis not present

## 2017-05-01 DIAGNOSIS — Z85828 Personal history of other malignant neoplasm of skin: Secondary | ICD-10-CM | POA: Diagnosis not present

## 2017-05-01 DIAGNOSIS — R1011 Right upper quadrant pain: Secondary | ICD-10-CM | POA: Diagnosis not present

## 2017-05-01 DIAGNOSIS — R110 Nausea: Secondary | ICD-10-CM | POA: Diagnosis not present

## 2017-05-01 DIAGNOSIS — C787 Secondary malignant neoplasm of liver and intrahepatic bile duct: Secondary | ICD-10-CM | POA: Diagnosis not present

## 2017-05-01 DIAGNOSIS — R630 Anorexia: Secondary | ICD-10-CM | POA: Diagnosis not present

## 2017-05-01 DIAGNOSIS — R1012 Left upper quadrant pain: Secondary | ICD-10-CM | POA: Diagnosis not present

## 2017-05-01 DIAGNOSIS — D701 Agranulocytosis secondary to cancer chemotherapy: Secondary | ICD-10-CM | POA: Diagnosis not present

## 2017-05-01 LAB — CBC WITH DIFFERENTIAL/PLATELET
Abs Granulocyte: 2.9 10*3/uL (ref 1.5–6.5)
Basophils Absolute: 0 10*3/uL (ref 0.0–0.1)
Basophils Relative: 1 %
Eosinophils Absolute: 0.1 10*3/uL (ref 0.0–0.5)
Eosinophils Relative: 3 %
HCT: 40.3 % (ref 38.4–49.9)
Hemoglobin: 12.8 g/dL — ABNORMAL LOW (ref 13.0–17.1)
Lymphocytes Relative: 10 %
Lymphs Abs: 0.4 10*3/uL — ABNORMAL LOW (ref 0.9–3.3)
MCH: 33.6 pg — ABNORMAL HIGH (ref 27.2–33.4)
MCHC: 31.8 g/dL — ABNORMAL LOW (ref 32.0–36.0)
MCV: 105.8 fL — ABNORMAL HIGH (ref 79.3–98.0)
Monocytes Absolute: 0.7 10*3/uL (ref 0.1–0.9)
Monocytes Relative: 16 %
Neutro Abs: 2.9 10*3/uL (ref 1.5–6.5)
Neutrophils Relative %: 70 %
Platelets: 161 10*3/uL (ref 140–400)
RBC: 3.81 MIL/uL — ABNORMAL LOW (ref 4.20–5.82)
RDW: 13.7 % (ref 11.0–15.6)
WBC: 4.1 10*3/uL (ref 4.0–10.3)

## 2017-05-01 LAB — COMPREHENSIVE METABOLIC PANEL
ALT: 16 U/L (ref 0–55)
AST: 24 U/L (ref 5–34)
Albumin: 1.8 g/dL — ABNORMAL LOW (ref 3.5–5.0)
Alkaline Phosphatase: 112 U/L (ref 40–150)
Anion gap: 3 (ref 3–11)
BUN: 26 mg/dL (ref 7–26)
CALCIUM: 8.1 mg/dL — AB (ref 8.4–10.4)
CHLORIDE: 105 mmol/L (ref 98–109)
CO2: 32 mmol/L — ABNORMAL HIGH (ref 22–29)
CREATININE: 0.9 mg/dL (ref 0.70–1.30)
Glucose, Bld: 96 mg/dL (ref 70–140)
Potassium: 4.7 mmol/L (ref 3.5–5.1)
Sodium: 140 mmol/L (ref 136–145)
Total Bilirubin: 0.2 mg/dL (ref 0.2–1.2)
Total Protein: 4.9 g/dL — ABNORMAL LOW (ref 6.4–8.3)

## 2017-05-01 MED ORDER — HEPARIN SOD (PORK) LOCK FLUSH 100 UNIT/ML IV SOLN
500.0000 [IU] | Freq: Once | INTRAVENOUS | Status: AC
  Administered 2017-05-01: 500 [IU]
  Filled 2017-05-01: qty 5

## 2017-05-01 MED ORDER — SODIUM CHLORIDE 0.9% FLUSH
10.0000 mL | Freq: Once | INTRAVENOUS | Status: AC
  Administered 2017-05-01: 10 mL
  Filled 2017-05-01: qty 10

## 2017-05-01 MED ORDER — HYDROCODONE-ACETAMINOPHEN 10-325 MG PO TABS: 1.0000 | ORAL_TABLET | ORAL | 0 refills | Status: DC | PRN

## 2017-05-01 NOTE — Telephone Encounter (Signed)
Scheduled appt per 1/8 los - Gave patient AVS and calender per los. - Pt to be called from [place] radiology for PET scan ,.

## 2017-05-01 NOTE — Patient Instructions (Signed)
Implanted Port Home Guide An implanted port is a type of central line that is placed under the skin. Central lines are used to provide IV access when treatment or nutrition needs to be given through a person's veins. Implanted ports are used for long-term IV access. An implanted port may be placed because:  You need IV medicine that would be irritating to the small veins in your hands or arms.  You need long-term IV medicines, such as antibiotics.  You need IV nutrition for a long period.  You need frequent blood draws for lab tests.  You need dialysis.  Implanted ports are usually placed in the chest area, but they can also be placed in the upper arm, the abdomen, or the leg. An implanted port has two main parts:  Reservoir. The reservoir is round and will appear as a small, raised area under your skin. The reservoir is the part where a needle is inserted to give medicines or draw blood.  Catheter. The catheter is a thin, flexible tube that extends from the reservoir. The catheter is placed into a large vein. Medicine that is inserted into the reservoir goes into the catheter and then into the vein.  How will I care for my incision site? Do not get the incision site wet. Bathe or shower as directed by your health care provider. How is my port accessed? Special steps must be taken to access the port:  Before the port is accessed, a numbing cream can be placed on the skin. This helps numb the skin over the port site.  Your health care provider uses a sterile technique to access the port. ? Your health care provider must put on a mask and sterile gloves. ? The skin over your port is cleaned carefully with an antiseptic and allowed to dry. ? The port is gently pinched between sterile gloves, and a needle is inserted into the port.  Only "non-coring" port needles should be used to access the port. Once the port is accessed, a blood return should be checked. This helps ensure that the port  is in the vein and is not clogged.  If your port needs to remain accessed for a constant infusion, a clear (transparent) bandage will be placed over the needle site. The bandage and needle will need to be changed every week, or as directed by your health care provider.  Keep the bandage covering the needle clean and dry. Do not get it wet. Follow your health care provider's instructions on how to take a shower or bath while the port is accessed.  If your port does not need to stay accessed, no bandage is needed over the port.  What is flushing? Flushing helps keep the port from getting clogged. Follow your health care provider's instructions on how and when to flush the port. Ports are usually flushed with saline solution or a medicine called heparin. The need for flushing will depend on how the port is used.  If the port is used for intermittent medicines or blood draws, the port will need to be flushed: ? After medicines have been given. ? After blood has been drawn. ? As part of routine maintenance.  If a constant infusion is running, the port may not need to be flushed.  How long will my port stay implanted? The port can stay in for as long as your health care provider thinks it is needed. When it is time for the port to come out, surgery will be   done to remove it. The procedure is similar to the one performed when the port was put in. When should I seek immediate medical care? When you have an implanted port, you should seek immediate medical care if:  You notice a bad smell coming from the incision site.  You have swelling, redness, or drainage at the incision site.  You have more swelling or pain at the port site or the surrounding area.  You have a fever that is not controlled with medicine.  This information is not intended to replace advice given to you by your health care provider. Make sure you discuss any questions you have with your health care provider. Document  Released: 04/10/2005 Document Revised: 09/16/2015 Document Reviewed: 12/16/2012 Elsevier Interactive Patient Education  2017 [employer]  

## 2017-05-01 NOTE — Telephone Encounter (Signed)
Fax from [facility] pharmacy requesting provider to confirm he is aware of interaction between Xanax and Hydrocodone. Dr. Benay Spice reviewed, OK to fill Hydrocodone. He is aware of sedation risk. Pharmacist notified via telephone- also added Dx code to script.

## 2017-05-01 NOTE — Progress Notes (Signed)
[name] OFFICE PROGRESS NOTE   Diagnosis: Esophagus cancer  INTERVAL HISTORY:   [name] returns as scheduled.  He decided against the left kidney stone removal.  He continues to have bilateral low back pain.  He sometimes has pain anteriorly.  The chest tube drainage has diminished.  No fever.  He continues hydrocodone for pain.  He requested an increased dose.  Objective:  Vital signs in last 24 hours:  Blood pressure (!) 158/83, pulse 64, temperature 97.8 F (36.6 C), temperature source Oral, resp. rate 20, height '6\' 3"'  (1.905 m), weight 179 lb 14.4 oz (81.6 kg), SpO2 98 %.    Resp: Inspiratory rub at the bilateral anterior and posterior chest, no respiratory distress Cardio: Regular rate and rhythm GI: No hepatosplenomegaly, no mass Vascular: Diffuse pitting edema   Portacath/PICC-without erythema  Lab Results:  Lab Results  Component Value Date   WBC 4.1 05/01/2017   HGB 12.8 (L) 05/01/2017   HCT 40.3 05/01/2017   MCV 105.8 (H) 05/01/2017   PLT 161 05/01/2017   NEUTROABS 2.9 05/01/2017    CMP     Component Value Date/Time   NA 140 05/01/2017 0814   NA 141 04/26/2017 1535   K 4.7 05/01/2017 0814   K 4.6 04/26/2017 1535   CL 105 05/01/2017 0814   CO2 32 (H) 05/01/2017 0814   CO2 32 (H) 04/26/2017 1535   GLUCOSE 96 05/01/2017 0814   GLUCOSE 87 04/26/2017 1535   BUN 26 05/01/2017 0814   BUN 24.4 04/26/2017 1535   CREATININE 0.90 05/01/2017 0814   CREATININE 0.8 04/26/2017 1535   CALCIUM 8.1 (L) 05/01/2017 0814   CALCIUM 8.3 (L) 04/26/2017 1535   PROT 4.9 (L) 05/01/2017 0814   PROT 5.5 (L) 04/26/2017 1535   ALBUMIN 1.8 (L) 05/01/2017 0814   ALBUMIN 2.0 (L) 04/26/2017 1535   AST 24 05/01/2017 0814   AST 26 04/26/2017 1535   ALT 16 05/01/2017 0814   ALT 17 04/26/2017 1535   ALKPHOS 112 05/01/2017 0814   ALKPHOS 119 04/26/2017 1535   BILITOT 0.2 05/01/2017 0814   BILITOT 0.39 04/26/2017 1535   GFRNONAA >60 05/01/2017 0814   GFRAA >60  05/01/2017 0814    Lab Results  Component Value Date   CEA1 5.71 (H) 04/06/2017     Medications: I have reviewed the patient's current medications.   Assessment/Plan: 1. Adenocarcinoma of the distal esophagus,uT3uN2  Presenting with a food impaction and dysphagia 08/20/2014  Staging CT scans of the chest, abdomen, and pelvis 10/06/2014 with indeterminate pulmonary nodules, a 9 mm paraesophageal lymph node, and circumferential thickening of the distal esophagus  PET scan 10/23/2014 confirmed hypermetabolic thickening at the distal esophagus with small paraesophageal nodes having faint nonspecific activity. A pleural-based focus of consolidation at the posterior right lower lobe had low metabolic activity-favored to be a benign process.  Initiation of concurrent weekly Taxol/carboplatin and radiation on 11/02/2014, radiation completed 12/09/2014  CTs 12/31/2014 revealed distal esophageal wall thickening with adjacent small lymph nodes suspicious for metastases, stable lung nodule posterior to the right bronchus intermedius and the persistent 5 mm nodule in the right lower lobe  Esophagectomy 02/01/2015 with the pathology confirming a ypT3,ypN3 tumor, positive distal margin  MSI-stable, tumor mutation burden-5  CT chest 07/17/2016-bilateral pleural effusions, Bibasilar airspace opacities with peribronchial vascular nodularity, new low-density right liver lesion  Left thoracentesis 07/19/2016-cytology showedreactive mesothelial cells; mixed lymphoid population  Ultrasound-guided biopsy of a segment 8 liver lesion on 08/01/2016-adenocarcinoma  Cycle  1 FOLFOX 09/06/2016  Cycle 2 FOLFOX 09/21/2016  Cycle 3 FOLFOX 10/05/2016  Cycle 4 FOLFOX 11/02/2016  Cycle 5 FOLFOX 11/20/2016  CT chest 12/01/2016-increased size of right liver lesion, persistent bilateral pleural effusions  Taxol and ramcirumab 12/12/2016; day 8 Taxol 12/19/2016   Cycle 2 Taxol and ramcirumab 01/09/2017,  day 8 Taxol discontinued  Cycle 3 Taxol/ ramucirumab10/05/2016, Taxol dose reduced  Cycle 4 Taxol/ramucirumab 02/06/2017  Cycle 5 Taxol/ramucirumab 02/19/2017  Cycle 6 Taxol/ramucirumab11/13/2018  CT chest 03/14/2017-reduced bilateral pleural effusions, right lower lobe airspace disease, unchanged right liver lesion  Cycle 7 Taxol/ramucirumab 03/19/2017  Cycle 8 ramucirumab 04/06/2017 (Taxol held due to neuropathy)  CT, urology center, 04/18/2017- slight increase in right hepatic metastasis, unchanged bibasilar pulmonary nodularity, bilateral pleural effusions, bilateral nonobstructing renal calculi  2. Prostatic hypertrophy-Status post TUR 01/26/2016  3. Hypertension  4. History of multiple basal cell skin cancers  5. History of thrombocytopenia secondary to chemotherapy  6. PVCs/bicuspid aortic valve-followed by cardiology  7. Port-A-Cath placement 09/06/2016  8. Neutropenia secondary to chemotherapy.  9. Bilateral pleural effusions, status post right thoracentesis 11/10/2016, left thoracentesis 11/15/2016  Cytology from 11/15/2016-negative  Cytology from 12/04/2016,12/06/2016, and 12/19/2016-negative  Placement of a left Pleurx catheter 01/03/2017  Placement of a right Pleurx catheter 01/16/2017  10.Peripheral neuropathy-progressive 04/06/2017. Taxol held. 11.  Bilateral renal pelvis stones confirmed on CT 04/18/2017 12.  Bilateral flank pain beginning December 2018- etiology unclear   Disposition: [name] appears unchanged.  He continues to have bilateral low back pain.  The etiology of the pain is unclear.  I suspect the pain is related to pleural based tumor.  I think it is unlikely the pain is related to the renal pelvis stones.  The pain could be related to metastatic disease involving the spine.  We decided to proceed with a restaging PET scan.  We increase the dose of hydrocodone.  [name] will return for an office visit  05/07/2017.  The Taxol/ramucirumab will be discontinued if the PET scan confirms disease progression.  25 minutes were spent with the patient today.  The majority of the time was used for counseling and coordination of care.  Betsy Coder, MD  05/01/2017  5:12 PM

## 2017-05-02 ENCOUNTER — Inpatient Hospital Stay: Payer: Medicare Other

## 2017-05-03 ENCOUNTER — Encounter: Payer: Self-pay | Admitting: Oncology

## 2017-05-03 ENCOUNTER — Encounter: Payer: Self-pay | Admitting: Nurse Practitioner

## 2017-05-04 ENCOUNTER — Telehealth: Payer: Self-pay

## 2017-05-04 NOTE — Telephone Encounter (Signed)
Returned call regarding location of scan appt on Tues. Informed patient it will be at [facility]. Patient voiced unerstanding.

## 2017-05-07 ENCOUNTER — Ambulatory Visit: Payer: Medicare Other | Admitting: Nurse Practitioner

## 2017-05-07 ENCOUNTER — Telehealth: Payer: Self-pay | Admitting: Oncology

## 2017-05-07 NOTE — Telephone Encounter (Signed)
Scheduled appt per 1/11 sch message - Patient is aware of appt date and time.

## 2017-05-08 ENCOUNTER — Ambulatory Visit
Admission: RE | Admit: 2017-05-08 | Discharge: 2017-05-08 | Disposition: A | Discharge status: Home / Self Care | Payer: Medicare Other | Source: Ambulatory Visit | Attending: Oncology | Admitting: Oncology

## 2017-05-08 DIAGNOSIS — C155 Malignant neoplasm of lower third of esophagus: Secondary | ICD-10-CM | POA: Insufficient documentation

## 2017-05-08 DIAGNOSIS — I2510 Atherosclerotic heart disease of native coronary artery without angina pectoris: Secondary | ICD-10-CM | POA: Diagnosis not present

## 2017-05-08 DIAGNOSIS — C787 Secondary malignant neoplasm of liver and intrahepatic bile duct: Secondary | ICD-10-CM | POA: Diagnosis not present

## 2017-05-08 DIAGNOSIS — I700 Atherosclerosis of aorta: Secondary | ICD-10-CM | POA: Diagnosis not present

## 2017-05-08 DIAGNOSIS — M47895 Other spondylosis, thoracolumbar region: Secondary | ICD-10-CM | POA: Diagnosis not present

## 2017-05-08 DIAGNOSIS — R918 Other nonspecific abnormal finding of lung field: Secondary | ICD-10-CM | POA: Insufficient documentation

## 2017-05-08 DIAGNOSIS — M549 Dorsalgia, unspecified: Secondary | ICD-10-CM | POA: Diagnosis not present

## 2017-05-08 DIAGNOSIS — C159 Malignant neoplasm of esophagus, unspecified: Secondary | ICD-10-CM | POA: Diagnosis not present

## 2017-05-08 DIAGNOSIS — R188 Other ascites: Secondary | ICD-10-CM | POA: Diagnosis not present

## 2017-05-08 DIAGNOSIS — N200 Calculus of kidney: Secondary | ICD-10-CM | POA: Insufficient documentation

## 2017-05-08 DIAGNOSIS — J910 Malignant pleural effusion: Secondary | ICD-10-CM | POA: Diagnosis not present

## 2017-05-08 LAB — GLUCOSE, CAPILLARY: Glucose-Capillary: 67 mg/dL (ref 65–99)

## 2017-05-08 MED ORDER — FLUDEOXYGLUCOSE F - 18 (FDG) INJECTION
12.8600 | Freq: Once | INTRAVENOUS | Status: AC | PRN
  Administered 2017-05-08: 12.86 via INTRAVENOUS

## 2017-05-09 ENCOUNTER — Inpatient Hospital Stay (HOSPITAL_BASED_OUTPATIENT_CLINIC_OR_DEPARTMENT_OTHER): Payer: Medicare Other | Admitting: Oncology

## 2017-05-09 ENCOUNTER — Inpatient Hospital Stay: Payer: Medicare Other

## 2017-05-09 ENCOUNTER — Telehealth: Payer: Self-pay | Admitting: Nurse Practitioner

## 2017-05-09 ENCOUNTER — Telehealth: Payer: Self-pay | Admitting: Oncology

## 2017-05-09 VITALS — BP 159/69 | HR 68 | Temp 97.5°F | Resp 18 | Ht 75.0 in | Wt 178.5 lb

## 2017-05-09 DIAGNOSIS — Z923 Personal history of irradiation: Secondary | ICD-10-CM | POA: Diagnosis not present

## 2017-05-09 DIAGNOSIS — J90 Pleural effusion, not elsewhere classified: Secondary | ICD-10-CM

## 2017-05-09 DIAGNOSIS — R091 Pleurisy: Secondary | ICD-10-CM | POA: Diagnosis not present

## 2017-05-09 DIAGNOSIS — T451X5S Adverse effect of antineoplastic and immunosuppressive drugs, sequela: Secondary | ICD-10-CM | POA: Diagnosis not present

## 2017-05-09 DIAGNOSIS — I10 Essential (primary) hypertension: Secondary | ICD-10-CM | POA: Diagnosis not present

## 2017-05-09 DIAGNOSIS — C155 Malignant neoplasm of lower third of esophagus: Secondary | ICD-10-CM | POA: Diagnosis not present

## 2017-05-09 DIAGNOSIS — D701 Agranulocytosis secondary to cancer chemotherapy: Secondary | ICD-10-CM

## 2017-05-09 DIAGNOSIS — G629 Polyneuropathy, unspecified: Secondary | ICD-10-CM

## 2017-05-09 DIAGNOSIS — N200 Calculus of kidney: Secondary | ICD-10-CM

## 2017-05-09 DIAGNOSIS — M545 Low back pain: Secondary | ICD-10-CM | POA: Diagnosis not present

## 2017-05-09 DIAGNOSIS — Z85828 Personal history of other malignant neoplasm of skin: Secondary | ICD-10-CM

## 2017-05-09 DIAGNOSIS — Z9221 Personal history of antineoplastic chemotherapy: Secondary | ICD-10-CM

## 2017-05-09 DIAGNOSIS — Z95828 Presence of other vascular implants and grafts: Secondary | ICD-10-CM

## 2017-05-09 DIAGNOSIS — R630 Anorexia: Secondary | ICD-10-CM | POA: Diagnosis not present

## 2017-05-09 DIAGNOSIS — C787 Secondary malignant neoplasm of liver and intrahepatic bile duct: Secondary | ICD-10-CM

## 2017-05-09 DIAGNOSIS — Z79899 Other long term (current) drug therapy: Secondary | ICD-10-CM

## 2017-05-09 LAB — BODY FLUID CELL COUNT WITH DIFFERENTIAL
LYMPHS FL: 84 %
Lymphs, Fluid: 37 %
MONOCYTE-MACROPHAGE-SEROUS FLUID: 16 % — AB (ref 50–90)
Monocyte-Macrophage-Serous Fluid: 61 % (ref 50–90)
Neutrophil Count, Fluid: 2 % (ref 0–25)
Total Nucleated Cell Count, Fluid: 79 cu mm (ref 0–1000)
Total Nucleated Cell Count, Fluid: 85 cu mm (ref 0–1000)

## 2017-05-09 NOTE — Progress Notes (Signed)
[place] OFFICE PROGRESS NOTE   Diagnosis: Esophagus cancer  INTERVAL HISTORY:   David [name] returns as scheduled.  He continues to have pain in the lower back.  He takes hydrocodone twice daily.  He continues to drain the bilateral Pleurx catheters.  The drainage is diminishing bilaterally.  He has a poor appetite.  Objective:  Vital signs in last 24 hours:  Blood pressure (!) 159/69, pulse 68, temperature (!) 97.5 F (36.4 C), temperature source Oral, resp. rate 18, height '6\' 3"'  (1.905 m), weight 178 lb 8 oz (81 kg), SpO2 98 %.   Resp: Scattered rub, moves air bilaterally, no respiratory distress Cardio: Regular rate and rhythm GI: No hepatomegaly, nontender Vascular: Pitting edema at the lower leg and foot bilaterally.  Trace edema at the waistline    Portacath/PICC-without erythema  Lab Results:  Lab Results  Component Value Date   WBC 4.1 05/01/2017   HGB 12.8 (L) 05/01/2017   HCT 40.3 05/01/2017   MCV 105.8 (H) 05/01/2017   PLT 161 05/01/2017   NEUTROABS 2.9 05/01/2017    CMP     Component Value Date/Time   NA 140 05/01/2017 0814   NA 141 04/26/2017 1535   K 4.7 05/01/2017 0814   K 4.6 04/26/2017 1535   CL 105 05/01/2017 0814   CO2 32 (H) 05/01/2017 0814   CO2 32 (H) 04/26/2017 1535   GLUCOSE 96 05/01/2017 0814   GLUCOSE 87 04/26/2017 1535   BUN 26 05/01/2017 0814   BUN 24.4 04/26/2017 1535   CREATININE 0.90 05/01/2017 0814   CREATININE 0.8 04/26/2017 1535   CALCIUM 8.1 (L) 05/01/2017 0814   CALCIUM 8.3 (L) 04/26/2017 1535   PROT 4.9 (L) 05/01/2017 0814   PROT 5.5 (L) 04/26/2017 1535   ALBUMIN 1.8 (L) 05/01/2017 0814   ALBUMIN 2.0 (L) 04/26/2017 1535   AST 24 05/01/2017 0814   AST 26 04/26/2017 1535   ALT 16 05/01/2017 0814   ALT 17 04/26/2017 1535   ALKPHOS 112 05/01/2017 0814   ALKPHOS 119 04/26/2017 1535   BILITOT 0.2 05/01/2017 0814   BILITOT 0.39 04/26/2017 1535   GFRNONAA >60 05/01/2017 0814   GFRAA >60 05/01/2017 0814     Lab Results  Component Value Date   CEA1 5.71 (H) 04/06/2017    Lab Results  Component Value Date   INR 0.98 01/16/2017    Imaging:  Nm Pet Image Restag (ps) Skull Base To Thigh  Result Date: 05/08/2017 CLINICAL DATA:  Subsequent treatment strategy for esophageal cancer. Back pain. EXAM: NUCLEAR MEDICINE PET SKULL BASE TO THIGH TECHNIQUE: 12.9 mCi F-18 FDG was injected intravenously. Full-ring PET imaging was performed from the skull base to thigh after the radiotracer. CT data was obtained and used for attenuation correction and anatomic localization. FASTING BLOOD GLUCOSE:  Value: 67 mg/dl COMPARISON:  Multiple exams, including CT examinations from 04/18/2017 and 03/14/2017, and prior PET-CT from 10/23/2014 FINDINGS: NECK No hypermetabolic lymph nodes in the neck. CHEST Bilateral chest tubes are present. There are soft tissue densities along portions of the pleural space, including a right posterior lesion measuring 2.7 cm with maximum SUV 3.1, and right inferomedial pleural thickening measuring 6.2 by 2.2 cm with maximum SUV 3.2, such that malignant pleural effusions are not excluded. There is some consolidation or pleural thickening on the left side near the chest tube margin measuring 4.6 by 2.8 cm on image 138/4 with maximum SUV 3.8. Correlate with any prior analysis of the pleural fluid. Gastric  pull-through noted. Along the right side of the pull-through in the mid to lower thorax there is a focus of accentuated metabolic activity along the right side of the pull-through with maximum SUV 4.0. Faint right hilar activity has maximum SUV 3.5. Chronic airway thickening and tree-in-bud opacities in the right upper lobe on image 75/4, maximum SUV in this vicinity 1.0. Scattered additional tree-in-bud nodularity in both lungs. There is a small residual right pleural effusion and only a trace residual left pleural effusion. Right Port-A-Cath tip: Lower SVC.  Coronary atherosclerosis.  ABDOMEN/PELVIS Posteriorly in the right hepatic lobe there is a 6.2 by 5.0 cm hypermetabolic metastatic lesion with maximum SUV 9.3. By my measurement this lesion was previously about by my measurements this lesion was previously about 6.0 by 4.6 cm on 04/18/2017. Mild soft tissue fullness in the left retrocrural region on image 165/4 measuring about 2.1 by 1.8 cm, maximum SUV 3.3. Aortoiliac atherosclerotic vascular disease. Bilateral nonobstructive nephrolithiasis. Diffuse subcutaneous and mesenteric edema. Small left scrotal hydrocele. Small but abnormal amount of pelvic ascites. SKELETON No focal hypermetabolic activity to suggest skeletal metastasis. Bridging spurring of both sacroiliac joints. Degenerative arthropathy of both hips. Thoracolumbar spondylosis. Degenerative sternoclavicular arthropathy bilaterally. IMPRESSION: 1. Enlarging right hepatic lobe metastatic lesion over the past few months, currently 6.2 by 5.0 cm. Current maximum SUV of this lesion 9.3. 2. Faintly hypermetabolic pleural mass is versus juxtapleural airspace opacities in the lungs along with mildly hypermetabolic right hilar lymph node and a focus of accentuated metabolic activity along the right side of the gastric pull-through, activity with SUV in the 3-4 range. The patient has scattered reticulonodular opacities in the lungs favoring atypical infectious bronchiolitis, and so some of this may well be reactive in the setting of chronic atypical mycobacterial infection, but merit surveillance. By my understanding, cytology of the pleural fluid collections has been negative for malignancy to date. 3. soft tissue fullness in the left retrocrural region with low-grade metabolic activity at 3.3, suspicious for potential malignancy but not absolutely diagnostic, likewise meriting surveillance. 4. Other imaging findings of potential clinical significance: Aortic Atherosclerosis (ICD10-I70.0). Coronary atherosclerosis. Diffuse subcutaneous  and mesenteric edema suggesting third spacing of fluid. Small but abnormal amount of pelvic ascites. Thoracolumbar spondylosis. Bilateral nonobstructive nephrolithiasis. Electronically Signed   By: Van Clines M.D.   On: 05/08/2017 10:22    Medications: I have reviewed the patient's current medications.   Assessment/Plan:  1. Adenocarcinoma of the distal esophagus,[id number]  Presenting with a food impaction and dysphagia 08/20/2014  Staging CT scans of the chest, abdomen, and pelvis 10/06/2014 with indeterminate pulmonary nodules, a 9 mm paraesophageal lymph node, and circumferential thickening of the distal esophagus  PET scan 10/23/2014 confirmed hypermetabolic thickening at the distal esophagus with small paraesophageal nodes having faint nonspecific activity. A pleural-based focus of consolidation at the posterior right lower lobe had low metabolic activity-favored to be a benign process.  Initiation of concurrent weekly Taxol/carboplatin and radiation on 11/02/2014, radiation completed 12/09/2014  CTs 12/31/2014 revealed distal esophageal wall thickening with adjacent small lymph nodes suspicious for metastases, stable lung nodule posterior to the right bronchus intermedius and the persistent 5 mm nodule in the right lower lobe  Esophagectomy 02/01/2015 with the pathology confirming a ypT3,ypN3 tumor, positive distal margin  MSI-stable, tumor mutation burden-5  CT chest 07/17/2016-bilateral pleural effusions, Bibasilar airspace opacities with peribronchial vascular nodularity, new low-density right liver lesion  Left thoracentesis 07/19/2016-cytology showedreactive mesothelial cells; mixed lymphoid population  Ultrasound-guided biopsy of a segment 8 liver  lesion on 08/01/2016-adenocarcinoma  Cycle 1 FOLFOX 09/06/2016  Cycle 2 FOLFOX 09/21/2016  Cycle 3 FOLFOX 10/05/2016  Cycle 4 FOLFOX 11/02/2016  Cycle 5 FOLFOX 11/20/2016  CT chest 12/01/2016-increased size of right  liver lesion, persistent bilateral pleural effusions  Taxol and ramcirumab 12/12/2016; day 8 Taxol 12/19/2016   Cycle 2 Taxol and ramcirumab 01/09/2017, day 8 Taxol discontinued  Cycle 3 Taxol/ ramucirumab10/05/2016, Taxol dose reduced  Cycle 4 Taxol/ramucirumab 02/06/2017  Cycle 5 Taxol/ramucirumab 02/19/2017  Cycle 6 Taxol/ramucirumab11/13/2018  CT chest 03/14/2017-reduced bilateral pleural effusions, right lower lobe airspace disease, unchanged right liver lesion  Cycle 7 Taxol/ramucirumab 03/19/2017  Cycle 8 ramucirumab 04/06/2017 (Taxol held due to neuropathy)  CT, urology center, 04/18/2017- slight increase in right hepatic metastasis, unchanged bibasilar pulmonary nodularity, bilateral pleural effusions, bilateral nonobstructing renal calculi  PET scan 09/28/3014- hypermetabolic liver lesion, no bone metastases, metabolic activity slightly above background involving pleural thickening and retrocrural thickening, scattered reticular nodular opacities in the lung parenchyma  2. Prostatic hypertrophy-Status post TUR 01/26/2016  3. Hypertension  4. History of multiple basal cell skin cancers  5. History of thrombocytopenia secondary to chemotherapy  6. PVCs/bicuspid aortic valve-followed by cardiology  7. Port-A-Cath placement 09/06/2016  8. Neutropenia secondary to chemotherapy.  9. Bilateral pleural effusions, status post right thoracentesis 11/10/2016, left thoracentesis 11/15/2016  Cytology from 11/15/2016-negative  Cytology from 12/04/2016,12/06/2016, and 12/19/2016-negative  Placement of a left Pleurx catheter 01/03/2017  Placement of a right Pleurx catheter 01/16/2017  10.Peripheral neuropathy-progressive 04/06/2017. Taxol held. 11.Bilateral renal pelvis stones confirmed on CT 04/18/2017 12.Bilateral flank pain beginning December 2018- etiology unclear     Disposition: David [name] appears stable.  He has metastatic esophagus  cancer.  The restaging PET scan reveals a hypermetabolic liver lesion and no other clear evidence of metastatic disease.  There is pleural thickening and retrocrural soft tissue thickening with minimal activity.  The etiology of his pain is unclear.  I suspect the pain is related to pleural-based tumor.  It is possible the retrocrural lesion is causing pain as this correlates with the site of pain on exam today.  He will continue hydrocodone as needed for pain.  He will continue draining the Pleurx catheters.  I discussed treatment options with David [name] and his family.  It is unclear whether he is benefiting from the Taxol/ramucirumab.  We decided to hold treatment for now.  He understands the small chance of a clinical benefit with further salvage systemic therapy.  The tumor is MSI stable.  We will submit PD1 testing.  I offered him another opinion with Dr. Fanny Skates.  He would like me to contact her, but does not wish to schedule an appointment at present.  David [name] will see Dr. Servando Snare next week.  We will send pleural fluid for cytology, culture, and cell count/protein today.  He will return for an office visit here in approximately 2 weeks.  40 minutes were spent with the patient today.  The majority of the time was used for counseling and coordination of care.  Betsy Coder, MD  05/09/2017  11:42 AM

## 2017-05-09 NOTE — Telephone Encounter (Signed)
Spoke to patient regarding upcoming January appointments per 1/16 los.

## 2017-05-09 NOTE — Telephone Encounter (Signed)
Gave avs and calendar for January  °

## 2017-05-11 ENCOUNTER — Telehealth: Payer: Self-pay

## 2017-05-11 NOTE — Telephone Encounter (Signed)
Returned pt call regarding swelling. Pt states there is swelling in his lower legs and ankles as well as his arms and hands. Per pt "this is not new, it just looks a little more puffy today". Pt questioning if he can take his HCTZ or Lasix. Made MD aware. Per Dr. Benay Spice, ok to take either one and see if swelling gets better over weekend. Also informed that things are negative with the pleural fluid per MD. Pt voiced understanding.

## 2017-05-13 LAB — BODY FLUID CULTURE: Culture: NO GROWTH

## 2017-05-15 ENCOUNTER — Ambulatory Visit (HOSPITAL_COMMUNITY): Payer: Medicare Other

## 2017-05-16 ENCOUNTER — Other Ambulatory Visit: Payer: Self-pay | Admitting: Cardiothoracic Surgery

## 2017-05-16 DIAGNOSIS — C155 Malignant neoplasm of lower third of esophagus: Secondary | ICD-10-CM

## 2017-05-17 ENCOUNTER — Encounter: Payer: Self-pay | Admitting: Oncology

## 2017-05-17 ENCOUNTER — Ambulatory Visit
Admission: RE | Admit: 2017-05-17 | Discharge: 2017-05-17 | Disposition: A | Discharge status: Home / Self Care | Payer: Medicare Other | Source: Ambulatory Visit | Attending: Cardiothoracic Surgery | Admitting: Cardiothoracic Surgery

## 2017-05-17 ENCOUNTER — Ambulatory Visit (INDEPENDENT_AMBULATORY_CARE_PROVIDER_SITE_OTHER): Payer: Medicare Other | Admitting: Cardiothoracic Surgery

## 2017-05-17 VITALS — BP 160/79 | HR 63 | Resp 20 | Ht 75.0 in | Wt 174.0 lb

## 2017-05-17 DIAGNOSIS — R918 Other nonspecific abnormal finding of lung field: Secondary | ICD-10-CM | POA: Diagnosis not present

## 2017-05-17 DIAGNOSIS — C155 Malignant neoplasm of lower third of esophagus: Secondary | ICD-10-CM

## 2017-05-17 DIAGNOSIS — C160 Malignant neoplasm of cardia: Secondary | ICD-10-CM | POA: Diagnosis not present

## 2017-05-17 DIAGNOSIS — Z09 Encounter for follow-up examination after completed treatment for conditions other than malignant neoplasm: Secondary | ICD-10-CM | POA: Diagnosis not present

## 2017-05-17 DIAGNOSIS — J90 Pleural effusion, not elsewhere classified: Secondary | ICD-10-CM

## 2017-05-17 LAB — BODY FLUID CULTURE: CULTURE: NORMAL

## 2017-05-17 NOTE — Progress Notes (Signed)
[address]       [place] [address]             [phone]      David [name] [facility] Medical Record #[id number] Date of Birth: 1940/12/09  Referring: Ladell Pier, MD Primary Care: Marton Redwood, MD  Chief Complaint:   POST OP FOLLOW UP  History of Present Illness:     Patient comes in the office after placement of bilateral Pleurx catheters.  He has been draining the catheters each on a daily basis slightly more comes out of the right than the left, when the amount had been close to a liter, it is dropped to 400 mL a day from the right 200-250 from the left.  The patient's primary complaint is discomfort around the left Pleurx catheter insertion site  . Cancer Staging Cancer of lower third of esophagus ([place]) Staging form: Esophagus - Squamous Cell Carcinoma, AJCC 7th Edition - Clinical: Stage IIIB (T3, N2, M0) - Signed by Ladell Pier, MD on 10/19/2014 - Pathologic stage from 02/01/2015: Stage IIIC (T3, N3, cM0, G4 - Undifferentiated, Location: Middle) - Signed by Grace Isaac, MD on 02/04/2015    Past Medical History:  Diagnosis Date  . Allergy   . Anxiety   . Arthritis   . [name]'s esophagus   . Basal cell carcinoma    nose, face, back  . Chronic bilateral pleural effusions    pleurx catheters placed by Dr Servando Snare on 12/2016.  wife hooks up to drainage bottles every other nite since 02/2017.    Marland Kitchen Dyspnea    reports due to fluid in lungs   . Dysrhythmia    skipped beats but not sure rhythm   . Edema    below knees and ankles   . Enlarged prostate   . Esophageal cancer ([place])   . Food impaction of esophagus 08/20/2014  . GERD (gastroesophageal reflux disease)    needs 30* elevation since total esophagectomy -uses wedge at home  . Heart murmur   . History of chemotherapy   . History of kidney stones   . Hypertension   . Liver lesion   . 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  Pleural effusion on left   . Port-A-Cath in place   . Radiation   . Urinary frequency   . Wears glasses      Social History   Tobacco Use  Smoking Status Former Smoker  . Last attempt to quit: 06/07/1961  . Years since quitting: 55.9  Smokeless Tobacco Never Used    Social History   Substance and Sexual Activity  Alcohol Use No     Allergies  Allergen Reactions  . Adhesive [Tape] Dermatitis    Causes blisters and skin irritations  . Dilaudid [Hydromorphone Hcl] Hives and Itching  . Fentanyl Itching  . Nexium [Esomeprazole Magnesium] Other (See Comments)    Difficulty urinating and passing stool; dry mouth  . Other     Thinks it was oxycodone; made him hallucinate  . Oxycodone Nausea And Vomiting  . Statins Other (See Comments)    Leg cramping    Current Outpatient Medications  Medication Sig Dispense Refill  . ALPRAZolam (XANAX) 0.5 MG tablet Take 0.5 mg by mouth at bedtime.   4  . b complex vitamins capsule Take 1 capsule by mouth daily.    . bisacodyl (BISACODYL) 5 MG  EC tablet Take 10 mg by mouth daily as needed for mild constipation or moderate constipation.    . cyproheptadine (PERIACTIN) 2 MG/5ML syrup Take 4 mg by mouth 3 (three) times daily before meals. 10 ml prior to each meal    . flecainide (TAMBOCOR) 100 MG tablet TAKE 1 TABLET (100 MG TOTAL) BY MOUTH 2 (TWO) TIMES DAILY. 60 tablet 8  . HYDROcodone-acetaminophen (NORCO) 10-325 MG tablet Take 1 tablet by mouth every 4 (four) hours as needed. 90 tablet 0  . metoprolol succinate (TOPROL-XL) 50 MG 24 hr tablet Take 50 mg by mouth daily.    . ondansetron (ZOFRAN) 8 MG tablet Take 1 tablet (8 mg total) by mouth every 8 (eight) hours as needed for nausea or vomiting. 30 tablet 2  . pantoprazole (PROTONIX) 20 MG tablet Take 1 tablet (20 mg total) by mouth daily. 30 tablet 5  . polyethylene glycol (MIRALAX / GLYCOLAX) packet Take 17 g by mouth daily as needed for mild constipation.    . potassium chloride  (MICRO-K) 10 MEQ CR capsule Take 1 capsule (10 mEq total) by mouth daily. 30 capsule 1  . potassium chloride SA (K-DUR,KLOR-CON) 20 MEQ tablet Take 1 tablet (20 mEq total) by mouth daily. 30 tablet 1  . prochlorperazine (COMPAZINE) 10 MG tablet Take 10 mg by mouth every 6 (six) hours as needed for nausea or vomiting.     No current facility-administered medications for this visit.    Facility-Administered Medications Ordered in Other Visits  Medication Dose Route Frequency Provider Last Rate Last Dose  . sodium chloride flush (NS) 0.9 % injection 10 mL  10 mL Intravenous PRN Ladell Pier, MD   10 mL at 10/05/16 0933       Physical Exam: BP (!) 160/79   Pulse 63   Resp 20   Ht 6\' 3"  (1.905 m)   Wt 174 lb (78.9 kg)   SpO2 93% Comment: RA  BMI 21.75 kg/m   General appearance: alert, cooperative, appears stated age and cachectic Head: Normocephalic, without obvious abnormality, atraumatic Neck: no adenopathy, no carotid bruit, no JVD, supple, symmetrical, trachea midline and thyroid not enlarged, symmetric, no tenderness/mass/nodules Lymph nodes: Cervical, supraclavicular, and axillary nodes normal. Resp: clear to auscultation bilaterally Back: symmetric, no curvature. ROM normal. No CVA tenderness. Cardio: regular rate and rhythm, S1, S2 normal, no murmur, click, rub or gallop GI: soft, non-tender; bowel sounds normal; no masses,  no organomegaly Extremities: edema Bilateral pedal edema is present Neurologic: Grossly normal Pleurx catheters bilaterally are intact but decreased irritation around him from previous visit  Diagnostic Studies & Laboratory data:     Recent Radiology Findings:   Dg Chest 2 View  Result Date: 05/17/2017 CLINICAL DATA:  Pleuritic drains.  Esophageal cancer.  Chest pain. EXAM: CHEST  2 VIEW COMPARISON:  03/20/2017 FINDINGS: Bilateral PleurX catheter drains in place, stable. No pneumothorax. Small bilateral pleural effusions. Cardiomegaly with vascular  congestion and patchy bilateral lower lobe airspace opacities, similar to prior study. Right Port-A-Cath remains in place, unchanged. IMPRESSION: Bilateral pleural drains remain in stable position. No pneumothorax. Small bilateral effusions. Cardiomegaly, vascular congestion. Patchy bilateral lower lobe airspace opacities which could reflect pneumonia. These are similar to prior study. Electronically Signed   By: Rolm Baptise M.D.   On: 05/17/2017 10:58    I have independently reviewed the above radiology studies  and reviewed the findings with the patient.   Recent Lab Findings: Lab Results  Component Value Date  WBC 4.1 05/01/2017   HGB 12.8 (L) 05/01/2017   HCT 40.3 05/01/2017   PLT 161 05/01/2017   GLUCOSE 96 05/01/2017   ALT 16 05/01/2017   AST 24 05/01/2017   NA 140 05/01/2017   K 4.7 05/01/2017   CL 105 05/01/2017   CREATININE 0.90 05/01/2017   BUN 26 05/01/2017   CO2 32 (H) 05/01/2017   TSH 2.737 01/09/2017   INR 0.98 01/16/2017      Assessment / Plan:      Recurrent bilateral pleural effusions, with history of esophageal cancer Still no evidence of malignancy in the pleural fluid, recent PET scan is also reviewed with the primary finding being the mass in his liver. He continues to have chronic abdominal pain Still draining 700 mL to a liter every other day from the right and slightly less from the left Of encouraged him to use dietary supplements, continues to have significant protein loss from pleural fluid But currently the amount of drainage is too much to consider talc pleurodesis We will continue with the current drainage we will check a follow-up chest x-ray in 1 month   Grace Isaac MD      [address] [place] [address] Office [phone]   Beeper [phone]  05/17/2017 11:04 AM

## 2017-05-18 DIAGNOSIS — E860 Dehydration: Secondary | ICD-10-CM | POA: Diagnosis not present

## 2017-05-18 DIAGNOSIS — Z8501 Personal history of malignant neoplasm of esophagus: Secondary | ICD-10-CM | POA: Diagnosis not present

## 2017-05-18 DIAGNOSIS — R634 Abnormal weight loss: Secondary | ICD-10-CM | POA: Diagnosis not present

## 2017-05-18 DIAGNOSIS — Z6823 Body mass index (BMI) 23.0-23.9, adult: Secondary | ICD-10-CM | POA: Diagnosis not present

## 2017-05-21 ENCOUNTER — Encounter: Payer: Self-pay | Admitting: Nurse Practitioner

## 2017-05-22 ENCOUNTER — Encounter: Payer: Self-pay | Admitting: Nurse Practitioner

## 2017-05-22 ENCOUNTER — Inpatient Hospital Stay (HOSPITAL_BASED_OUTPATIENT_CLINIC_OR_DEPARTMENT_OTHER): Payer: Medicare Other | Admitting: Nurse Practitioner

## 2017-05-22 ENCOUNTER — Encounter: Payer: Self-pay | Admitting: Emergency Medicine

## 2017-05-22 ENCOUNTER — Other Ambulatory Visit (HOSPITAL_COMMUNITY)
Admission: RE | Admit: 2017-05-22 | Discharge: 2017-05-22 | Disposition: A | Discharge status: Home / Self Care | Payer: Medicare Other | Source: Ambulatory Visit | Attending: Oncology | Admitting: Oncology

## 2017-05-22 VITALS — BP 142/63 | HR 86 | Temp 97.0°F | Resp 18 | Ht 75.0 in | Wt 175.6 lb

## 2017-05-22 DIAGNOSIS — C155 Malignant neoplasm of lower third of esophagus: Secondary | ICD-10-CM

## 2017-05-22 DIAGNOSIS — Z95828 Presence of other vascular implants and grafts: Secondary | ICD-10-CM

## 2017-05-22 DIAGNOSIS — D701 Agranulocytosis secondary to cancer chemotherapy: Secondary | ICD-10-CM | POA: Diagnosis not present

## 2017-05-22 DIAGNOSIS — C787 Secondary malignant neoplasm of liver and intrahepatic bile duct: Secondary | ICD-10-CM

## 2017-05-22 DIAGNOSIS — I10 Essential (primary) hypertension: Secondary | ICD-10-CM

## 2017-05-22 DIAGNOSIS — J90 Pleural effusion, not elsewhere classified: Secondary | ICD-10-CM | POA: Insufficient documentation

## 2017-05-22 DIAGNOSIS — G629 Polyneuropathy, unspecified: Secondary | ICD-10-CM | POA: Diagnosis not present

## 2017-05-22 DIAGNOSIS — M545 Low back pain: Secondary | ICD-10-CM

## 2017-05-22 DIAGNOSIS — Z923 Personal history of irradiation: Secondary | ICD-10-CM | POA: Diagnosis not present

## 2017-05-22 DIAGNOSIS — Z85828 Personal history of other malignant neoplasm of skin: Secondary | ICD-10-CM | POA: Insufficient documentation

## 2017-05-22 DIAGNOSIS — R110 Nausea: Secondary | ICD-10-CM | POA: Diagnosis not present

## 2017-05-22 DIAGNOSIS — N200 Calculus of kidney: Secondary | ICD-10-CM

## 2017-05-22 DIAGNOSIS — Z79899 Other long term (current) drug therapy: Secondary | ICD-10-CM

## 2017-05-22 DIAGNOSIS — R1012 Left upper quadrant pain: Secondary | ICD-10-CM

## 2017-05-22 DIAGNOSIS — T451X5S Adverse effect of antineoplastic and immunosuppressive drugs, sequela: Secondary | ICD-10-CM

## 2017-05-22 DIAGNOSIS — R630 Anorexia: Secondary | ICD-10-CM | POA: Diagnosis not present

## 2017-05-22 DIAGNOSIS — R1011 Right upper quadrant pain: Secondary | ICD-10-CM | POA: Diagnosis not present

## 2017-05-22 DIAGNOSIS — R600 Localized edema: Secondary | ICD-10-CM

## 2017-05-22 DIAGNOSIS — Z9221 Personal history of antineoplastic chemotherapy: Secondary | ICD-10-CM | POA: Diagnosis not present

## 2017-05-22 MED ORDER — PREDNISONE 20 MG PO TABS: ORAL_TABLET | ORAL | 0 refills | Status: AC

## 2017-05-22 MED ORDER — ONDANSETRON HCL 8 MG PO TABS
8.0000 mg | ORAL_TABLET | Freq: Once | ORAL | Status: AC
  Administered 2017-05-22: 8 mg via ORAL

## 2017-05-22 MED ORDER — ONDANSETRON HCL 8 MG PO TABS
ORAL_TABLET | ORAL | Status: AC
  Filled 2017-05-22: qty 1

## 2017-05-22 NOTE — Progress Notes (Signed)
PD1 testing requested to [facility] pathology.

## 2017-05-22 NOTE — Progress Notes (Addendum)
[place] OFFICE PROGRESS NOTE   Diagnosis: Esophagus cancer  INTERVAL HISTORY:   Mr. Slone returns as scheduled.  He needs to have bilateral flank pain.  He takes hydrocodone with good relief.  He also has intermittent pain at the left and right upper abdomen.  Yesterday he had some pain at the right chest which worsened with coughing.  This pain has resolved.  He continues to have leg swelling.  He is having intermittent nausea.  Appetite is poor.  Objective:  Vital signs in last 24 hours:  Blood pressure (!) 142/63, pulse 86, temperature (!) 97 F (36.1 C), temperature source Oral, resp. rate 18, height _0  (1.905 m), weight 175 lb 9.6 oz (79.7 kg), SpO2 93 %.    HEENT: No thrush or ulcers. Resp: Rub right lower lung field.  He is moving air bilaterally.  No respiratory distress. Cardio: Regular rate and rhythm. GI: Abdomen soft and nontender.  No hepatomegaly. Vascular: Pitting edema at the lower legs bilaterally.  Hands also with trace pitting edema. Neuro: Alert and oriented. Skin: Left chest tube site with mild erythema, crusted drainage.  No active/purulent drainage.  Nontender around the site. Port-A-Cath without erythema.  Lab Results:  Lab Results  Component Value Date   WBC 4.1 05/01/2017   HGB 12.8 (L) 05/01/2017   HCT 40.3 05/01/2017   MCV 105.8 (H) 05/01/2017   PLT 161 05/01/2017   NEUTROABS 2.9 05/01/2017    Imaging:  No results found.  Medications: I have reviewed the patient's current medications.  Assessment/Plan: 1. Adenocarcinoma of the distal esophagus,uT3uN2  Presenting with a food impaction and dysphagia 08/20/2014  Staging CT scans of the chest, abdomen, and pelvis 10/06/2014 with indeterminate pulmonary nodules, a 9 mm paraesophageal lymph node, and circumferential thickening of the distal esophagus  PET scan 10/23/2014 confirmed hypermetabolic thickening at the distal esophagus with small paraesophageal nodes having  faint nonspecific activity. A pleural-based focus of consolidation at the posterior right lower lobe had low metabolic activity-favored to be a benign process.  Initiation of concurrent weekly Taxol/carboplatin and radiation on 11/02/2014, radiation completed 12/09/2014  CTs 12/31/2014 revealed distal esophageal wall thickening with adjacent small lymph nodes suspicious for metastases, stable lung nodule posterior to the right bronchus intermedius and the persistent 5 mm nodule in the right lower lobe  Esophagectomy 02/01/2015 with the pathology confirming a ypT3,ypN3 tumor, positive distal margin  MSI-stable, tumor mutation burden-5  CT chest 07/17/2016-bilateral pleural effusions, Bibasilar airspace opacities with peribronchial vascular nodularity, new low-density right liver lesion  Left thoracentesis 07/19/2016-cytology showedreactive mesothelial cells; mixed lymphoid population  Ultrasound-guided biopsy of a segment 8 liver lesion on 08/01/2016-adenocarcinoma  Cycle 1 FOLFOX 09/06/2016  Cycle 2 FOLFOX 09/21/2016  Cycle 3 FOLFOX 10/05/2016  Cycle 4 FOLFOX 11/02/2016  Cycle 5 FOLFOX 11/20/2016  CT chest 12/01/2016-increased size of right liver lesion, persistent bilateral pleural effusions  Taxol and ramcirumab 12/12/2016; day 8 Taxol 12/19/2016   Cycle 2 Taxol and ramcirumab 01/09/2017, day 8 Taxol discontinued  Cycle 3 Taxol/ ramucirumab10/05/2016, Taxol dose reduced  Cycle 4 Taxol/ramucirumab 02/06/2017  Cycle 5 Taxol/ramucirumab 02/19/2017  Cycle 6 Taxol/ramucirumab11/13/2018  CT chest 03/14/2017-reduced bilateral pleural effusions, right lower lobe airspace disease, unchanged right liver lesion  Cycle 7 Taxol/ramucirumab 03/19/2017  Cycle 8 ramucirumab 04/06/2017 (Taxol held due to neuropathy)  CT, urology center, 04/18/2017-slight increase in right hepatic metastasis, unchanged bibasilar pulmonary nodularity, bilateral pleural effusions, bilateral  nonobstructing renal calculi  PET scan 0/11/6759- hypermetabolic liver lesion, no bone  metastases, metabolic activity slightly above background involving pleural thickening and retrocrural thickening, scattered reticular nodular opacities in the lung parenchyma  2. Prostatic hypertrophy-Status post TUR 01/26/2016  3. Hypertension  4. History of multiple basal cell skin cancers  5. History of thrombocytopenia secondary to chemotherapy  6. PVCs/bicuspid aortic valve-followed by cardiology  7. Port-A-Cath placement 09/06/2016  8. Neutropenia secondary to chemotherapy.  9. Bilateral pleural effusions, status post right thoracentesis 11/10/2016, left thoracentesis 11/15/2016  Cytology from 11/15/2016-negative  Cytology from 12/04/2016,12/06/2016, and 12/19/2016-negative  Placement of a left Pleurx catheter 01/03/2017  Placement of a right Pleurx catheter 01/16/2017  10.Peripheral neuropathy-progressive 04/06/2017. Taxol held. 11.Bilateral renal pelvis stones confirmed on CT 04/18/2017 12.Bilateral flank pain beginning December 2018-etiology unclear   Disposition: Mr. Longmore appears unchanged.  He continues to have bilateral back/flank pain.  Dr. Benay Spice recommends a trial of prednisone.  He will continue hydrocodone as needed as well.  Mr. Ardis and his wife understand the small chance of clinical benefit with further salvage systemic therapy.  We have submitted a request for PD1 testing and are currently awaiting those results.  He will return for a follow-up visit on 05/28/2017.  He will contact the office in the interim with any problems.  Patient seen with Dr. Benay Spice.    Ned Card ANP/GNP-BC   05/22/2017  10:37 AM This was a shared visit with Ned Card.  Mr. Bebo was interviewed and examined.  We have not documented malignancy or infection in the pleural fluid.  We decided to begin a trial of prednisone for the apparent pleural pain.  He  will return for an office visit in 1 week.  We discussed systemic treatment options.  I contacted Dr. Fanny Skates last week and am waiting to hear back from her.  Julieanne Manson, MD

## 2017-05-28 ENCOUNTER — Inpatient Hospital Stay: Payer: Medicare Other | Attending: Oncology | Admitting: Nurse Practitioner

## 2017-05-28 ENCOUNTER — Telehealth: Payer: Self-pay | Admitting: Oncology

## 2017-05-28 VITALS — BP 127/60 | HR 70 | Temp 98.0°F | Resp 18 | Ht 75.0 in | Wt 172.7 lb

## 2017-05-28 DIAGNOSIS — Z79899 Other long term (current) drug therapy: Secondary | ICD-10-CM | POA: Insufficient documentation

## 2017-05-28 DIAGNOSIS — Z923 Personal history of irradiation: Secondary | ICD-10-CM | POA: Diagnosis not present

## 2017-05-28 DIAGNOSIS — R630 Anorexia: Secondary | ICD-10-CM | POA: Diagnosis not present

## 2017-05-28 DIAGNOSIS — C155 Malignant neoplasm of lower third of esophagus: Secondary | ICD-10-CM | POA: Diagnosis not present

## 2017-05-28 DIAGNOSIS — R600 Localized edema: Secondary | ICD-10-CM | POA: Diagnosis not present

## 2017-05-28 DIAGNOSIS — Z452 Encounter for adjustment and management of vascular access device: Secondary | ICD-10-CM | POA: Diagnosis not present

## 2017-05-28 DIAGNOSIS — R634 Abnormal weight loss: Secondary | ICD-10-CM | POA: Insufficient documentation

## 2017-05-28 DIAGNOSIS — K5900 Constipation, unspecified: Secondary | ICD-10-CM | POA: Insufficient documentation

## 2017-05-28 DIAGNOSIS — J910 Malignant pleural effusion: Secondary | ICD-10-CM | POA: Diagnosis not present

## 2017-05-28 DIAGNOSIS — R109 Unspecified abdominal pain: Secondary | ICD-10-CM | POA: Diagnosis not present

## 2017-05-28 DIAGNOSIS — T451X5S Adverse effect of antineoplastic and immunosuppressive drugs, sequela: Secondary | ICD-10-CM | POA: Diagnosis not present

## 2017-05-28 DIAGNOSIS — Z7952 Long term (current) use of systemic steroids: Secondary | ICD-10-CM | POA: Diagnosis not present

## 2017-05-28 DIAGNOSIS — M549 Dorsalgia, unspecified: Secondary | ICD-10-CM | POA: Insufficient documentation

## 2017-05-28 DIAGNOSIS — Z888 Allergy status to other drugs, medicaments and biological substances status: Secondary | ICD-10-CM | POA: Diagnosis not present

## 2017-05-28 DIAGNOSIS — D701 Agranulocytosis secondary to cancer chemotherapy: Secondary | ICD-10-CM | POA: Insufficient documentation

## 2017-05-28 DIAGNOSIS — Z85828 Personal history of other malignant neoplasm of skin: Secondary | ICD-10-CM

## 2017-05-28 DIAGNOSIS — I10 Essential (primary) hypertension: Secondary | ICD-10-CM | POA: Insufficient documentation

## 2017-05-28 DIAGNOSIS — G620 Drug-induced polyneuropathy: Secondary | ICD-10-CM | POA: Diagnosis not present

## 2017-05-28 DIAGNOSIS — G4700 Insomnia, unspecified: Secondary | ICD-10-CM | POA: Diagnosis not present

## 2017-05-28 DIAGNOSIS — Z9221 Personal history of antineoplastic chemotherapy: Secondary | ICD-10-CM | POA: Diagnosis not present

## 2017-05-28 DIAGNOSIS — G893 Neoplasm related pain (acute) (chronic): Secondary | ICD-10-CM | POA: Diagnosis not present

## 2017-05-28 DIAGNOSIS — J90 Pleural effusion, not elsewhere classified: Secondary | ICD-10-CM | POA: Insufficient documentation

## 2017-05-28 MED ORDER — HYDROCODONE-ACETAMINOPHEN 10-325 MG PO TABS
1.0000 | ORAL_TABLET | ORAL | 0 refills | Status: AC | PRN
Start: 2017-05-28 — End: ?

## 2017-05-28 NOTE — Progress Notes (Addendum)
[place] OFFICE PROGRESS NOTE   Diagnosis: Esophagus cancer  INTERVAL HISTORY:   David [name] returns as scheduled.  The bilateral flank pain is better.  He is not sure if the prednisone helped as the pain seemed to be improving prior to beginning prednisone.  He has noted improvement in his appetite.  He is draining the Pleurx catheters every other day.  Typical drainage on the left is 200 cc and on the right 500 cc.  He is having trouble sleeping.  He falls asleep with Xanax but then tends to wake up a few hours later and has difficulty falling back to sleep.  Objective:  Vital signs in last 24 hours:  Blood pressure 127/60, pulse 70, temperature 98 F (36.7 C), temperature source Oral, resp. rate 18, height '6\' 3"'  (1.905 m), weight 172 lb 11.2 oz (78.3 kg), SpO2 97 %.    HEENT: No thrush or ulcers. Resp: Lungs clear bilaterally.  Bilateral Pleurx catheters. Cardio: Regular rate and rhythm. GI: Abdomen soft and nontender.  No hepatomegaly. Vascular: Pitting edema throughout both legs. Port-A-Cath without erythema.   Lab Results:  Lab Results  Component Value Date   WBC 4.1 05/01/2017   HGB 12.8 (L) 05/01/2017   HCT 40.3 05/01/2017   MCV 105.8 (H) 05/01/2017   PLT 161 05/01/2017   NEUTROABS 2.9 05/01/2017    Imaging:  No results found.  Medications: I have reviewed the patient's current medications.  Assessment/Plan: 1. Adenocarcinoma of the distal esophagus,uT3uN2  Presenting with a food impaction and dysphagia 08/20/2014  Staging CT scans of the chest, abdomen, and pelvis 10/06/2014 with indeterminate pulmonary nodules, a 9 mm paraesophageal lymph node, and circumferential thickening of the distal esophagus  PET scan 10/23/2014 confirmed hypermetabolic thickening at the distal esophagus with small paraesophageal nodes having faint nonspecific activity. A pleural-based focus of consolidation at the posterior right lower lobe had low metabolic  activity-favored to be a benign process.  Initiation of concurrent weekly Taxol/carboplatin and radiation on 11/02/2014, radiation completed 12/09/2014  CTs 12/31/2014 revealed distal esophageal wall thickening with adjacent small lymph nodes suspicious for metastases, stable lung nodule posterior to the right bronchus intermedius and the persistent 5 mm nodule in the right lower lobe  Esophagectomy 02/01/2015 with the pathology confirming a ypT3,ypN3 tumor, positive distal margin  MSI-stable, tumor mutation burden-5  CT chest 07/17/2016-bilateral pleural effusions, Bibasilar airspace opacities with peribronchial vascular nodularity, new low-density right liver lesion  Left thoracentesis 07/19/2016-cytology showedreactive mesothelial cells; mixed lymphoid population  Ultrasound-guided biopsy of a segment 8 liver lesion on 08/01/2016-adenocarcinoma  Cycle 1 FOLFOX 09/06/2016  Cycle 2 FOLFOX 09/21/2016  Cycle 3 FOLFOX 10/05/2016  Cycle 4 FOLFOX 11/02/2016  Cycle 5 FOLFOX 11/20/2016  CT chest 12/01/2016-increased size of right liver lesion, persistent bilateral pleural effusions  Taxol and ramcirumab 12/12/2016; day 8 Taxol 12/19/2016   Cycle 2 Taxol and ramcirumab 01/09/2017, day 8 Taxol discontinued  Cycle 3 Taxol/ ramucirumab10/05/2016, Taxol dose reduced  Cycle 4 Taxol/ramucirumab 02/06/2017  Cycle 5 Taxol/ramucirumab 02/19/2017  Cycle 6 Taxol/ramucirumab11/13/2018  CT chest 03/14/2017-reduced bilateral pleural effusions, right lower lobe airspace disease, unchanged right liver lesion  Cycle 7 Taxol/ramucirumab 03/19/2017  Cycle 8 ramucirumab 04/06/2017 (Taxol held due to neuropathy)  CT, urology center, 04/18/2017-slight increase in right hepatic metastasis, unchanged bibasilar pulmonary nodularity, bilateral pleural effusions, bilateral nonobstructing renal calculi  PET scan 01/15/2682- hypermetabolic liver lesion, no bone metastases, metabolic activity slightly  above background involving pleural thickening and retrocrural thickening, scattered reticular nodular opacities  in the lung parenchyma  2. Prostatic hypertrophy-Status post TUR 01/26/2016  3. Hypertension  4. History of multiple basal cell skin cancers  5. History of thrombocytopenia secondary to chemotherapy  6. PVCs/bicuspid aortic valve-followed by cardiology  7. Port-A-Cath placement 09/06/2016  8. Neutropenia secondary to chemotherapy.  9. Bilateral pleural effusions, status post right thoracentesis 11/10/2016, left thoracentesis 11/15/2016  Cytology from 11/15/2016-negative  Cytology from 12/04/2016,12/06/2016, and 12/19/2016-negative  Placement of a left Pleurx catheter 01/03/2017  Placement of a right Pleurx catheter 01/16/2017  10.Peripheral neuropathy-progressive 04/06/2017. Taxol held. 11.Bilateral renal pelvis stones confirmed on CT 04/18/2017 12.Bilateral flank pain beginning December 2018-etiology unclear    Disposition: David [name] appears unchanged.  The flank pain is better.  He will continue prednisone 20 mg daily.  The plan at present is to continue observation.  PD1 testing is pending.  Dr. Benay Spice will follow up with Dr. Fanny Skates for further recommendations.  He will return for a Port-A-Cath flush and follow-up visit in 2 weeks.  He will contact the office in the interim with any problems.  Patient seen with Dr. Benay Spice.  Ned Card ANP/GNP-BC   05/28/2017  11:59 AM  This was a shared visit with Ned Card.  [name] was interviewed and examined.  His overall performance status appears partially improved over the past few weeks.  I recommend continuing off of specific treatment for esophagus cancer.  He continues follow-up with Dr. Servando Snare for management of the Pleurx catheters.  The etiology of the pleural effusions remains unclear.  He will continue prednisone at a dose of 20 mg daily.  [name] will return for an office  visit in 2 weeks.  We are waiting on PD1 testing.  We discussed systemic treatment options with Mr. Hemmer and his family.  I will again attempt to contact Dr. Fanny Skates to get her opinion.  Julieanne Manson, MD

## 2017-05-28 NOTE — Telephone Encounter (Signed)
Patient scheduled AVS and Calendar given

## 2017-05-29 ENCOUNTER — Encounter (HOSPITAL_COMMUNITY): Payer: Self-pay

## 2017-05-31 ENCOUNTER — Encounter: Payer: Self-pay | Admitting: Oncology

## 2017-06-04 ENCOUNTER — Encounter: Payer: Self-pay | Admitting: Oncology

## 2017-06-04 ENCOUNTER — Other Ambulatory Visit: Payer: Self-pay

## 2017-06-04 ENCOUNTER — Encounter: Payer: Self-pay | Admitting: Nurse Practitioner

## 2017-06-04 DIAGNOSIS — C155 Malignant neoplasm of lower third of esophagus: Secondary | ICD-10-CM

## 2017-06-05 MED ORDER — ONDANSETRON HCL 8 MG PO TABS: 8.0000 mg | ORAL_TABLET | Freq: Three times a day (TID) | ORAL | 2 refills | Status: AC | PRN

## 2017-06-05 MED ORDER — HYDROMORPHONE HCL 2 MG PO TABS
ORAL_TABLET | ORAL | 0 refills | Status: AC
Start: 2017-06-05 — End: ?

## 2017-06-06 ENCOUNTER — Telehealth: Payer: Self-pay

## 2017-06-06 NOTE — Telephone Encounter (Signed)
Spoke with Cristie Hem at [facility] to obtain information for authorization for pt zofran. Alex provided the phone number for [place] Prior Authorization due to an issue with their fax machine. Spoke with Luvenia Starch at [place] [facility] who assisted in getting the authorization approved for the zofran for this pt. Pharmacy and pt made aware.

## 2017-06-07 ENCOUNTER — Encounter: Payer: Self-pay | Admitting: Oncology

## 2017-06-08 ENCOUNTER — Telehealth: Payer: Self-pay

## 2017-06-08 NOTE — Telephone Encounter (Signed)
Spoke with pt to discuss MyChart message and images below. Dr. Benay Spice viewed images and recommended pt to contact Dr. Everrett Coombe office. Pt voiced understanding and is agreeable with plan.

## 2017-06-08 NOTE — Telephone Encounter (Signed)
Mr. Leger called yesterday, 06/07/2017 concerned about pain at his left pleurX drainage site.  His wife stated that it does not have typical signs/ symptoms of infection (odor, drainage, temperature, red streaks).  Looks crusty at/ around the insertion site of the catheter.  Stated that it "hurts" to put the dressing on it.  Explained to patient that if he could send a picture to [place] so we could see exactly what is going on and possibly prevent a trip to the office.  He acknowledged.  Patient then sent picture to his Oncologist who referred him back to Dr. Everrett Coombe office.  Called patient this morning to ask for an update. Picture viewed which does not look to be infected.  He explained that his wife placed a peroxide solution on the site last night and helped with the crust that formed and helped with the pain.  Mr. Dull stated that he felt he did not need to come and have it looked at but said if it got worse that he would call Monday to see if he could get an appointment.  Patient in agreement with plan.  Patient will continue to monitor site and call if needed.

## 2017-06-11 ENCOUNTER — Telehealth: Payer: Self-pay | Admitting: Emergency Medicine

## 2017-06-11 ENCOUNTER — Telehealth: Payer: Self-pay | Admitting: Oncology

## 2017-06-11 ENCOUNTER — Inpatient Hospital Stay: Payer: Medicare Other

## 2017-06-11 ENCOUNTER — Inpatient Hospital Stay (HOSPITAL_BASED_OUTPATIENT_CLINIC_OR_DEPARTMENT_OTHER): Payer: Medicare Other | Admitting: Nurse Practitioner

## 2017-06-11 VITALS — BP 149/76 | HR 82 | Temp 98.0°F | Resp 18 | Ht 75.0 in | Wt 161.9 lb

## 2017-06-11 DIAGNOSIS — C155 Malignant neoplasm of lower third of esophagus: Secondary | ICD-10-CM | POA: Diagnosis not present

## 2017-06-11 DIAGNOSIS — Z452 Encounter for adjustment and management of vascular access device: Secondary | ICD-10-CM | POA: Diagnosis not present

## 2017-06-11 DIAGNOSIS — G620 Drug-induced polyneuropathy: Secondary | ICD-10-CM | POA: Diagnosis not present

## 2017-06-11 DIAGNOSIS — K5900 Constipation, unspecified: Secondary | ICD-10-CM | POA: Diagnosis not present

## 2017-06-11 DIAGNOSIS — R634 Abnormal weight loss: Secondary | ICD-10-CM | POA: Diagnosis not present

## 2017-06-11 DIAGNOSIS — D701 Agranulocytosis secondary to cancer chemotherapy: Secondary | ICD-10-CM | POA: Diagnosis not present

## 2017-06-11 DIAGNOSIS — R630 Anorexia: Secondary | ICD-10-CM

## 2017-06-11 DIAGNOSIS — M549 Dorsalgia, unspecified: Secondary | ICD-10-CM | POA: Diagnosis not present

## 2017-06-11 DIAGNOSIS — Z95828 Presence of other vascular implants and grafts: Secondary | ICD-10-CM

## 2017-06-11 DIAGNOSIS — Z923 Personal history of irradiation: Secondary | ICD-10-CM

## 2017-06-11 DIAGNOSIS — R109 Unspecified abdominal pain: Secondary | ICD-10-CM | POA: Diagnosis not present

## 2017-06-11 DIAGNOSIS — Z7952 Long term (current) use of systemic steroids: Secondary | ICD-10-CM

## 2017-06-11 DIAGNOSIS — G893 Neoplasm related pain (acute) (chronic): Secondary | ICD-10-CM | POA: Diagnosis not present

## 2017-06-11 DIAGNOSIS — R600 Localized edema: Secondary | ICD-10-CM

## 2017-06-11 DIAGNOSIS — J90 Pleural effusion, not elsewhere classified: Secondary | ICD-10-CM

## 2017-06-11 DIAGNOSIS — T451X5S Adverse effect of antineoplastic and immunosuppressive drugs, sequela: Secondary | ICD-10-CM

## 2017-06-11 DIAGNOSIS — Z9221 Personal history of antineoplastic chemotherapy: Secondary | ICD-10-CM

## 2017-06-11 DIAGNOSIS — I10 Essential (primary) hypertension: Secondary | ICD-10-CM

## 2017-06-11 DIAGNOSIS — Z79899 Other long term (current) drug therapy: Secondary | ICD-10-CM

## 2017-06-11 DIAGNOSIS — G4700 Insomnia, unspecified: Secondary | ICD-10-CM

## 2017-06-11 MED ORDER — MORPHINE SULFATE ER 15 MG PO TBCR: 15.0000 mg | EXTENDED_RELEASE_TABLET | Freq: Two times a day (BID) | ORAL | 0 refills | Status: DC

## 2017-06-11 MED ORDER — HEPARIN SOD (PORK) LOCK FLUSH 100 UNIT/ML IV SOLN
500.0000 [IU] | Freq: Once | INTRAVENOUS | Status: AC
  Administered 2017-06-11: 500 [IU]
  Filled 2017-06-11: qty 5

## 2017-06-11 MED ORDER — SODIUM CHLORIDE 0.9% FLUSH
10.0000 mL | Freq: Once | INTRAVENOUS | Status: AC
  Administered 2017-06-11: 10 mL
  Filled 2017-06-11: qty 10

## 2017-06-11 NOTE — Progress Notes (Addendum)
[place] OFFICE PROGRESS NOTE   Diagnosis: Esophagus cancer  INTERVAL HISTORY:   David [name] returns as scheduled.  He reports increased pain across his back and upper abdomen.  He tried Dilaudid but discontinued when he became confused.  He is taking hydrocodone 1/2-1 pill about every 4 hours.  He is having constipation.  He had a small bowel movement yesterday.  He plans to resume daily MiraLAX.  Appetite is poor.  He is losing weight.  Pain at the left chest tube site is better.  Objective:  Vital signs in last 24 hours:  Blood pressure (!) 149/76, pulse 82, temperature 98 F (36.7 C), temperature source Oral, resp. rate 18, height 6' 3" (1.905 m), weight 161 lb 14.4 oz (73.4 kg), SpO2 96 %.    HEENT: No thrush or ulcers. Resp: Good air movement bilaterally.  Bilateral chest tubes. Cardio: Regular rate and rhythm. GI: Abdomen is soft and nontender.  No hepatomegaly. Vascular: Pitting edema at the lower legs bilaterally.  Hands appear mildly edematous bilaterally. Neuro: Alert and oriented. Port-A-Cath without erythema.  Lab Results:  Lab Results  Component Value Date   WBC 4.1 05/01/2017   HGB 12.8 (L) 05/01/2017   HCT 40.3 05/01/2017   MCV 105.8 (H) 05/01/2017   PLT 161 05/01/2017   NEUTROABS 2.9 05/01/2017    Imaging:  No results found.  Medications: I have reviewed the patient's current medications.  Assessment/Plan: 1. Adenocarcinoma of the distal esophagus,uT3uN2  Presenting with a food impaction and dysphagia 08/20/2014  Staging CT scans of the chest, abdomen, and pelvis 10/06/2014 with indeterminate pulmonary nodules, a 9 mm paraesophageal lymph node, and circumferential thickening of the distal esophagus  PET scan 10/23/2014 confirmed hypermetabolic thickening at the distal esophagus with small paraesophageal nodes having faint nonspecific activity. A pleural-based focus of consolidation at the posterior right lower lobe had low metabolic  activity-favored to be a benign process.  Initiation of concurrent weekly Taxol/carboplatin and radiation on 11/02/2014, radiation completed 12/09/2014  CTs 12/31/2014 revealed distal esophageal wall thickening with adjacent small lymph nodes suspicious for metastases, stable lung nodule posterior to the right bronchus intermedius and the persistent 5 mm nodule in the right lower lobe  Esophagectomy 02/01/2015 with the pathology confirming a ypT3,ypN3 tumor, positive distal margin  MSI-stable, tumor mutation burden-5  CT chest 07/17/2016-bilateral pleural effusions, Bibasilar airspace opacities with peribronchial vascular nodularity, new low-density right liver lesion  Left thoracentesis 07/19/2016-cytology showedreactive mesothelial cells; mixed lymphoid population  Ultrasound-guided biopsy of a segment 8 liver lesion on 08/01/2016-adenocarcinoma  PD-L1 0%  Cycle 1 FOLFOX 09/06/2016  Cycle 2 FOLFOX 09/21/2016  Cycle 3 FOLFOX 10/05/2016  Cycle 4 FOLFOX 11/02/2016  Cycle 5 FOLFOX 11/20/2016  CT chest 12/01/2016-increased size of right liver lesion, persistent bilateral pleural effusions  Taxol and ramcirumab 12/12/2016; day 8 Taxol 12/19/2016   Cycle 2 Taxol and ramcirumab 01/09/2017, day 8 Taxol discontinued  Cycle 3 Taxol/ ramucirumab10/05/2016, Taxol dose reduced  Cycle 4 Taxol/ramucirumab 02/06/2017  Cycle 5 Taxol/ramucirumab 02/19/2017  Cycle 6 Taxol/ramucirumab11/13/2018  CT chest 03/14/2017-reduced bilateral pleural effusions, right lower lobe airspace disease, unchanged right liver lesion  Cycle 7 Taxol/ramucirumab 03/19/2017  Cycle 8 ramucirumab 04/06/2017 (Taxol held due to neuropathy)  CT, urology center, 04/18/2017-slight increase in right hepatic metastasis, unchanged bibasilar pulmonary nodularity, bilateral pleural effusions, bilateral nonobstructing renal calculi  PET scan 05/08/2017-hypermetabolic liver lesion, no bone metastases, metabolic  activity slightly above background involving pleural thickening and retrocrural thickening, scattered reticular nodular opacities in the  lung parenchyma  2. Prostatic hypertrophy-Status post TUR 01/26/2016  3. Hypertension  4. History of multiple basal cell skin cancers  5. History of thrombocytopenia secondary to chemotherapy  6. PVCs/bicuspid aortic valve-followed by cardiology  7. Port-A-Cath placement 09/06/2016  8. Neutropenia secondary to chemotherapy.  9. Bilateral pleural effusions, status post right thoracentesis 11/10/2016, left thoracentesis 11/15/2016  Cytology from 11/15/2016-negative  Cytology from 12/04/2016,12/06/2016, and 12/19/2016-negative  Placement of a left Pleurx catheter 01/03/2017  Placement of a right Pleurx catheter 01/16/2017  10.Peripheral neuropathy-progressive 04/06/2017. Taxol held. 11.Bilateral renal pelvis stones confirmed on CT 04/18/2017 12.Bilateral flank pain beginning December 2018-etiology unclear    Disposition: David [name] performance status is declining.  He is having more pain.  We discussed a referral to the [facility] program.  He and his wife are in agreement.  We discussed end-of-life issues including CODE STATUS.  He will be placed on NO CODE BLUE status per his wishes.  He will begin MS Contin 15 mg every 12 hours and continue hydrocodone as needed for breakthrough pain.  For the constipation he will begin MiraLAX on a daily basis.  He will contact the office if this is not effective.  He will return for a follow-up visit in 2-3 weeks.  He will contact the office in the interim with any problems.  Patient seen with Dr. Benay Spice.  25 minutes were spent face-to-face at today's visit with the majority of that time involved in counseling/coordination of care.    Ned Card ANP/GNP-BC   06/11/2017  3:56 PM This was a shared visit with Ned Card.  David [name] was interviewed and examined.   His overall performance status continues to decline.  I suspect this is due to slow progression of the metastatic esophagus cancer.  We reviewed the PD1 testing.  He is not a good candidate for immunotherapy.  We added MS Contin for pain control.  He agrees to a [facility] referral.  We discussed CPR and ACLS issues.  He will be placed on a no CODE BLUE status.  David [name] will return for an office visit in 2-3 weeks.  Julieanne Manson, MD

## 2017-06-11 NOTE — Telephone Encounter (Signed)
Hospice referral faxed to [facility]  On 2/18 @ 3:55pm.

## 2017-06-11 NOTE — Telephone Encounter (Signed)
Scheduled appt per 2/18 los - Gave patient AVS and calender per los.

## 2017-06-12 DIAGNOSIS — J910 Malignant pleural effusion: Secondary | ICD-10-CM | POA: Diagnosis not present

## 2017-06-12 DIAGNOSIS — F411 Generalized anxiety disorder: Secondary | ICD-10-CM | POA: Diagnosis not present

## 2017-06-12 DIAGNOSIS — E46 Unspecified protein-calorie malnutrition: Secondary | ICD-10-CM | POA: Diagnosis not present

## 2017-06-12 DIAGNOSIS — R1310 Dysphagia, unspecified: Secondary | ICD-10-CM | POA: Diagnosis not present

## 2017-06-12 DIAGNOSIS — I493 Ventricular premature depolarization: Secondary | ICD-10-CM | POA: Diagnosis not present

## 2017-06-12 DIAGNOSIS — C7800 Secondary malignant neoplasm of unspecified lung: Secondary | ICD-10-CM | POA: Diagnosis not present

## 2017-06-12 DIAGNOSIS — J309 Allergic rhinitis, unspecified: Secondary | ICD-10-CM | POA: Diagnosis not present

## 2017-06-12 DIAGNOSIS — N401 Enlarged prostate with lower urinary tract symptoms: Secondary | ICD-10-CM | POA: Diagnosis not present

## 2017-06-12 DIAGNOSIS — C159 Malignant neoplasm of esophagus, unspecified: Secondary | ICD-10-CM | POA: Diagnosis not present

## 2017-06-12 DIAGNOSIS — G620 Drug-induced polyneuropathy: Secondary | ICD-10-CM | POA: Diagnosis not present

## 2017-06-12 DIAGNOSIS — I10 Essential (primary) hypertension: Secondary | ICD-10-CM | POA: Diagnosis not present

## 2017-06-12 DIAGNOSIS — R634 Abnormal weight loss: Secondary | ICD-10-CM | POA: Diagnosis not present

## 2017-06-12 DIAGNOSIS — C787 Secondary malignant neoplasm of liver and intrahepatic bile duct: Secondary | ICD-10-CM | POA: Diagnosis not present

## 2017-06-13 ENCOUNTER — Encounter: Payer: Self-pay | Admitting: Oncology

## 2017-06-13 ENCOUNTER — Telehealth: Payer: Self-pay

## 2017-06-13 ENCOUNTER — Other Ambulatory Visit: Payer: Self-pay

## 2017-06-13 DIAGNOSIS — C787 Secondary malignant neoplasm of liver and intrahepatic bile duct: Secondary | ICD-10-CM | POA: Diagnosis not present

## 2017-06-13 DIAGNOSIS — R1310 Dysphagia, unspecified: Secondary | ICD-10-CM | POA: Diagnosis not present

## 2017-06-13 DIAGNOSIS — J910 Malignant pleural effusion: Secondary | ICD-10-CM | POA: Diagnosis not present

## 2017-06-13 DIAGNOSIS — C7800 Secondary malignant neoplasm of unspecified lung: Secondary | ICD-10-CM | POA: Diagnosis not present

## 2017-06-13 DIAGNOSIS — C155 Malignant neoplasm of lower third of esophagus: Secondary | ICD-10-CM

## 2017-06-13 DIAGNOSIS — G620 Drug-induced polyneuropathy: Secondary | ICD-10-CM | POA: Diagnosis not present

## 2017-06-13 DIAGNOSIS — C159 Malignant neoplasm of esophagus, unspecified: Secondary | ICD-10-CM | POA: Diagnosis not present

## 2017-06-13 MED ORDER — MORPHINE SULFATE ER 15 MG PO TBCR: 15.0000 mg | EXTENDED_RELEASE_TABLET | Freq: Two times a day (BID) | ORAL | 0 refills | Status: AC

## 2017-06-13 NOTE — Progress Notes (Signed)
Received PA request for Zofran. Submitted via Cover My Meds:   David [name] (Key: [id number]) - [id number]  Need help? Call us at [phone]   Status  Sent to [employer]  Next Steps  The plan will fax you a determination, typically within 1 to 5 business days. How do I follow up?  DrugOndansetron HCl 8MG  tablets  FormAetna [employer] Medicare Ondansetron FormPrior Authorization for [phone]) [phone]  Original Claim [id number] CALL [phone] B VS D DETERMINATIONDRUG REQUIRES PRIOR AUTHORIZATION

## 2017-06-13 NOTE — Telephone Encounter (Signed)
Received call from [facility] with [place] regarding MS Contin refill. Talked with Varney Biles to inform that paper script was provided in office on 2/18. Spoke with pt to inform that script was provided at last appt and [facility]. Wife states "we didn't get that prescription from [place], only hydrocodone". [facility] to confirm what meds have been filled and Ebony Hail with pharmacy informed me that Hydrocodone was filled but no MS Contin. This RN voiced understanding. New MS Contin [facility] to [facility].

## 2017-06-14 DIAGNOSIS — R1310 Dysphagia, unspecified: Secondary | ICD-10-CM | POA: Diagnosis not present

## 2017-06-14 DIAGNOSIS — J910 Malignant pleural effusion: Secondary | ICD-10-CM | POA: Diagnosis not present

## 2017-06-14 DIAGNOSIS — C7800 Secondary malignant neoplasm of unspecified lung: Secondary | ICD-10-CM | POA: Diagnosis not present

## 2017-06-14 DIAGNOSIS — G620 Drug-induced polyneuropathy: Secondary | ICD-10-CM | POA: Diagnosis not present

## 2017-06-14 DIAGNOSIS — C787 Secondary malignant neoplasm of liver and intrahepatic bile duct: Secondary | ICD-10-CM | POA: Diagnosis not present

## 2017-06-14 DIAGNOSIS — C159 Malignant neoplasm of esophagus, unspecified: Secondary | ICD-10-CM | POA: Diagnosis not present

## 2017-06-18 DIAGNOSIS — G620 Drug-induced polyneuropathy: Secondary | ICD-10-CM | POA: Diagnosis not present

## 2017-06-18 DIAGNOSIS — R1310 Dysphagia, unspecified: Secondary | ICD-10-CM | POA: Diagnosis not present

## 2017-06-18 DIAGNOSIS — C159 Malignant neoplasm of esophagus, unspecified: Secondary | ICD-10-CM | POA: Diagnosis not present

## 2017-06-18 DIAGNOSIS — C787 Secondary malignant neoplasm of liver and intrahepatic bile duct: Secondary | ICD-10-CM | POA: Diagnosis not present

## 2017-06-18 DIAGNOSIS — C7800 Secondary malignant neoplasm of unspecified lung: Secondary | ICD-10-CM | POA: Diagnosis not present

## 2017-06-18 DIAGNOSIS — J910 Malignant pleural effusion: Secondary | ICD-10-CM | POA: Diagnosis not present

## 2017-06-19 DIAGNOSIS — J910 Malignant pleural effusion: Secondary | ICD-10-CM | POA: Diagnosis not present

## 2017-06-19 DIAGNOSIS — C7800 Secondary malignant neoplasm of unspecified lung: Secondary | ICD-10-CM | POA: Diagnosis not present

## 2017-06-19 DIAGNOSIS — G620 Drug-induced polyneuropathy: Secondary | ICD-10-CM | POA: Diagnosis not present

## 2017-06-19 DIAGNOSIS — R1310 Dysphagia, unspecified: Secondary | ICD-10-CM | POA: Diagnosis not present

## 2017-06-19 DIAGNOSIS — C787 Secondary malignant neoplasm of liver and intrahepatic bile duct: Secondary | ICD-10-CM | POA: Diagnosis not present

## 2017-06-19 DIAGNOSIS — C159 Malignant neoplasm of esophagus, unspecified: Secondary | ICD-10-CM | POA: Diagnosis not present

## 2017-06-20 DIAGNOSIS — C7800 Secondary malignant neoplasm of unspecified lung: Secondary | ICD-10-CM | POA: Diagnosis not present

## 2017-06-20 DIAGNOSIS — R1310 Dysphagia, unspecified: Secondary | ICD-10-CM | POA: Diagnosis not present

## 2017-06-20 DIAGNOSIS — C787 Secondary malignant neoplasm of liver and intrahepatic bile duct: Secondary | ICD-10-CM | POA: Diagnosis not present

## 2017-06-20 DIAGNOSIS — J910 Malignant pleural effusion: Secondary | ICD-10-CM | POA: Diagnosis not present

## 2017-06-20 DIAGNOSIS — G620 Drug-induced polyneuropathy: Secondary | ICD-10-CM | POA: Diagnosis not present

## 2017-06-20 DIAGNOSIS — C159 Malignant neoplasm of esophagus, unspecified: Secondary | ICD-10-CM | POA: Diagnosis not present

## 2017-06-21 ENCOUNTER — Encounter: Payer: Medicare Other | Admitting: Cardiothoracic Surgery

## 2017-06-21 DIAGNOSIS — J910 Malignant pleural effusion: Secondary | ICD-10-CM | POA: Diagnosis not present

## 2017-06-21 DIAGNOSIS — G620 Drug-induced polyneuropathy: Secondary | ICD-10-CM | POA: Diagnosis not present

## 2017-06-21 DIAGNOSIS — C787 Secondary malignant neoplasm of liver and intrahepatic bile duct: Secondary | ICD-10-CM | POA: Diagnosis not present

## 2017-06-21 DIAGNOSIS — C7800 Secondary malignant neoplasm of unspecified lung: Secondary | ICD-10-CM | POA: Diagnosis not present

## 2017-06-21 DIAGNOSIS — C159 Malignant neoplasm of esophagus, unspecified: Secondary | ICD-10-CM | POA: Diagnosis not present

## 2017-06-21 DIAGNOSIS — R1310 Dysphagia, unspecified: Secondary | ICD-10-CM | POA: Diagnosis not present

## 2017-06-22 DIAGNOSIS — J910 Malignant pleural effusion: Secondary | ICD-10-CM | POA: Diagnosis not present

## 2017-06-22 DIAGNOSIS — I493 Ventricular premature depolarization: Secondary | ICD-10-CM | POA: Diagnosis not present

## 2017-06-22 DIAGNOSIS — J309 Allergic rhinitis, unspecified: Secondary | ICD-10-CM | POA: Diagnosis not present

## 2017-06-22 DIAGNOSIS — R1310 Dysphagia, unspecified: Secondary | ICD-10-CM | POA: Diagnosis not present

## 2017-06-22 DIAGNOSIS — I10 Essential (primary) hypertension: Secondary | ICD-10-CM | POA: Diagnosis not present

## 2017-06-22 DIAGNOSIS — R634 Abnormal weight loss: Secondary | ICD-10-CM | POA: Diagnosis not present

## 2017-06-22 DIAGNOSIS — G620 Drug-induced polyneuropathy: Secondary | ICD-10-CM | POA: Diagnosis not present

## 2017-06-22 DIAGNOSIS — C159 Malignant neoplasm of esophagus, unspecified: Secondary | ICD-10-CM | POA: Diagnosis not present

## 2017-06-22 DIAGNOSIS — C7800 Secondary malignant neoplasm of unspecified lung: Secondary | ICD-10-CM | POA: Diagnosis not present

## 2017-06-22 DIAGNOSIS — C787 Secondary malignant neoplasm of liver and intrahepatic bile duct: Secondary | ICD-10-CM | POA: Diagnosis not present

## 2017-06-22 DIAGNOSIS — E46 Unspecified protein-calorie malnutrition: Secondary | ICD-10-CM | POA: Diagnosis not present

## 2017-06-22 DIAGNOSIS — F411 Generalized anxiety disorder: Secondary | ICD-10-CM | POA: Diagnosis not present

## 2017-06-22 DIAGNOSIS — N401 Enlarged prostate with lower urinary tract symptoms: Secondary | ICD-10-CM | POA: Diagnosis not present

## 2017-06-23 DIAGNOSIS — J910 Malignant pleural effusion: Secondary | ICD-10-CM | POA: Diagnosis not present

## 2017-06-23 DIAGNOSIS — C787 Secondary malignant neoplasm of liver and intrahepatic bile duct: Secondary | ICD-10-CM | POA: Diagnosis not present

## 2017-06-23 DIAGNOSIS — C159 Malignant neoplasm of esophagus, unspecified: Secondary | ICD-10-CM | POA: Diagnosis not present

## 2017-06-23 DIAGNOSIS — C7800 Secondary malignant neoplasm of unspecified lung: Secondary | ICD-10-CM | POA: Diagnosis not present

## 2017-06-23 DIAGNOSIS — G620 Drug-induced polyneuropathy: Secondary | ICD-10-CM | POA: Diagnosis not present

## 2017-06-23 DIAGNOSIS — R1310 Dysphagia, unspecified: Secondary | ICD-10-CM | POA: Diagnosis not present

## 2017-06-24 ENCOUNTER — Encounter: Payer: Self-pay | Admitting: Cardiothoracic Surgery

## 2017-06-24 ENCOUNTER — Encounter: Payer: Self-pay | Admitting: Oncology

## 2017-06-26 ENCOUNTER — Ambulatory Visit: Payer: Medicare Other | Admitting: Oncology

## 2017-07-05 ENCOUNTER — Encounter: Payer: Medicare Other | Admitting: Cardiothoracic Surgery

## 2017-07-05 ENCOUNTER — Other Ambulatory Visit: Payer: Self-pay | Admitting: Nurse Practitioner

## 2017-07-23 DIAGNOSIS — 419620001 Death: Secondary | SNOMED CT | POA: Diagnosis not present

## 2017-07-23 DEATH — deceased

## 2019-02-18 IMAGING — DX DG CHEST 1V
1 series · 1 of 1 positions shown · non-contrast
Comparison: 12/04/2016

CLINICAL DATA: Post thoracentesis

EXAM:
CHEST 1 VIEW

[chest pa]
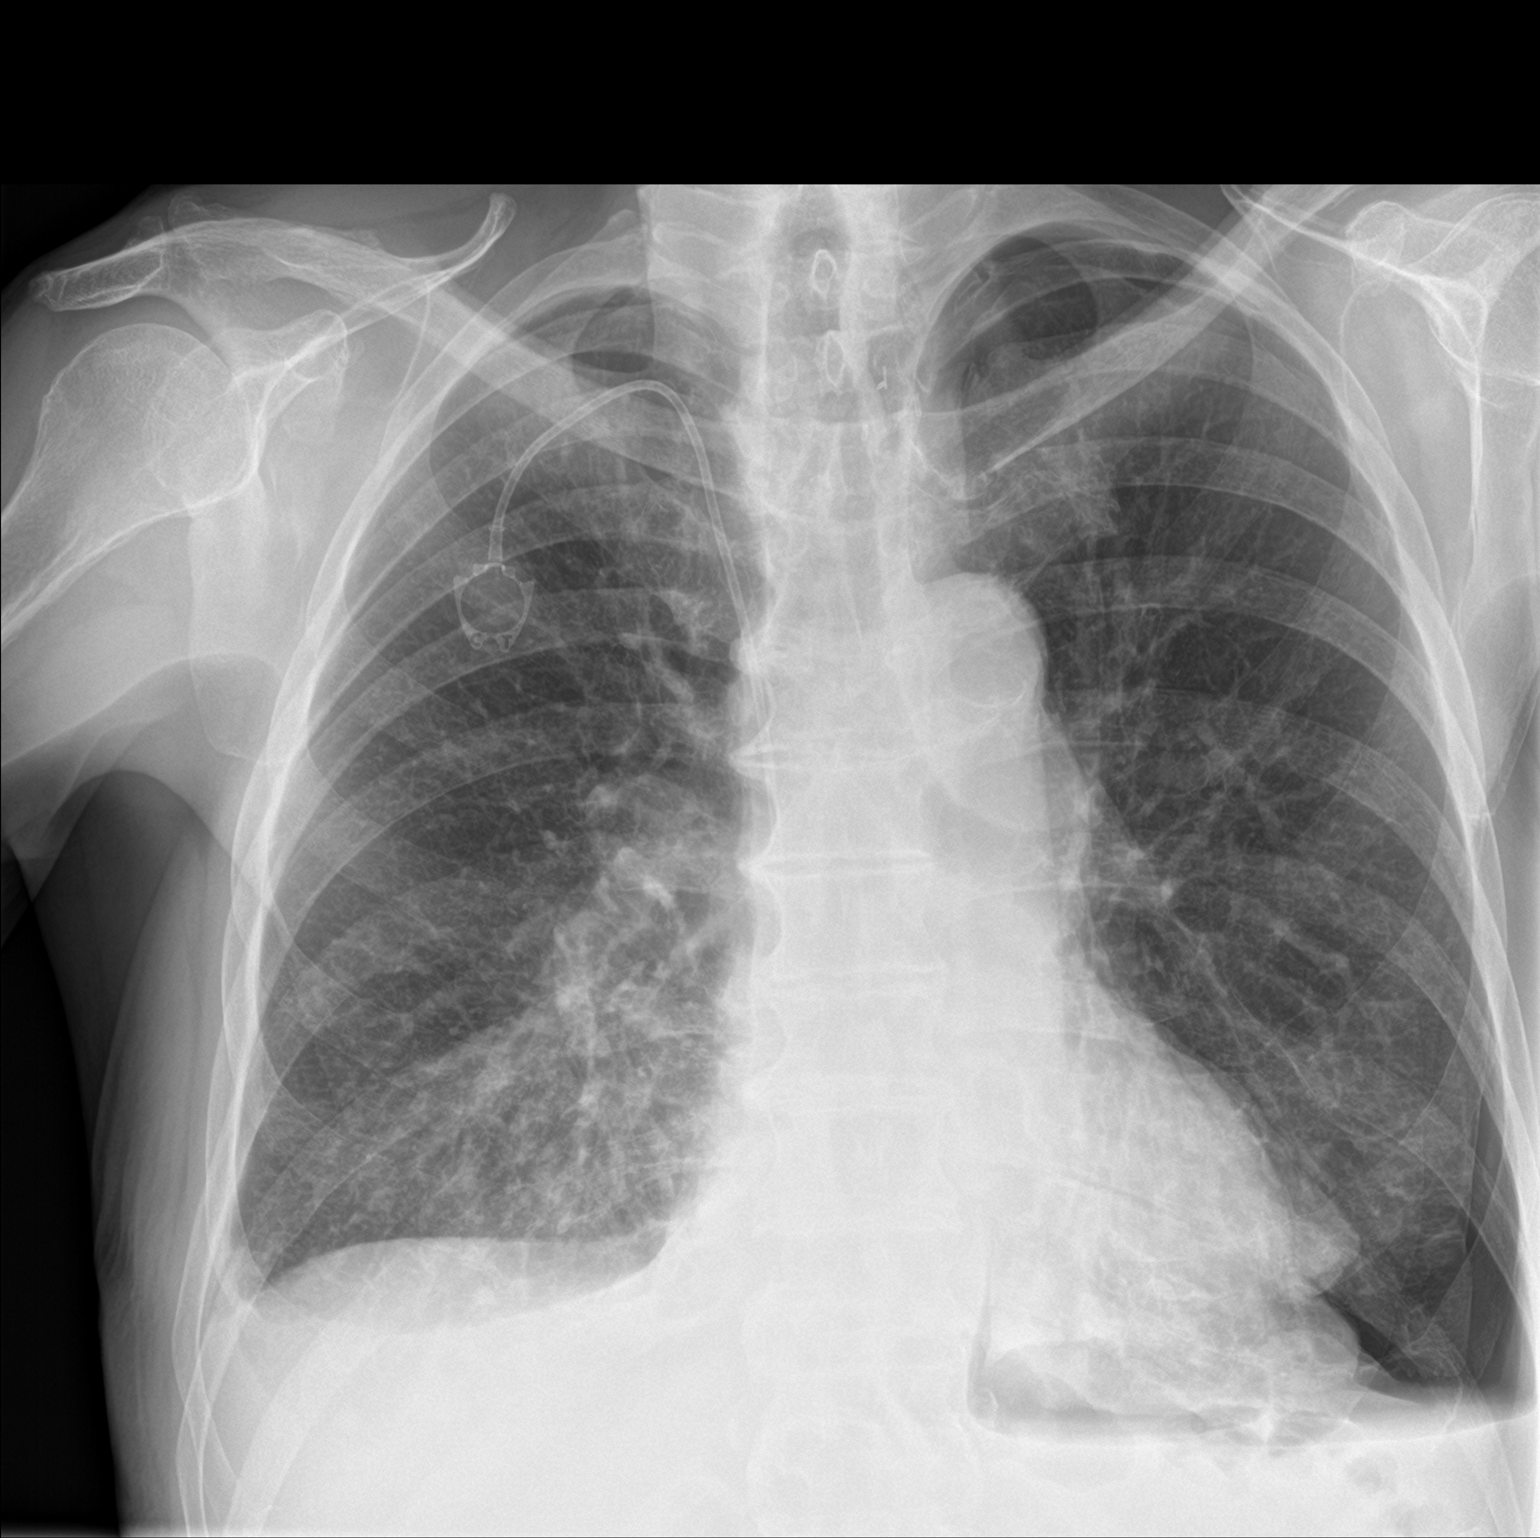

[1 of 1 positions shown; findings below may reference images not displayed]

FINDINGS: Decreasing left effusion following thoracentesis. There is a small
to moderate-sized left pneumothorax, noted over both the base and
apex, 10-15% in size. Small bilateral pleural effusions remain.
Heart is normal size. Right Port-A-Cath is unchanged.
IMPRESSION: Decreasing left effusion following thoracentesis. Small to
moderate-sized left pneumothorax, 10-15% noted over the left base
and left apex.

These results will be called to the ordering clinician or
representative by the Radiologist Assistant, and communication
documented in the PACS or zVision Dashboard.

## 2019-03-03 IMAGING — DX DG CHEST 1V
1 series · 1 of 1 positions shown · non-contrast
Comparison: 12/06/2016

CLINICAL DATA: History of metastatic esophageal carcinoma with
recurrent bilateral pleural effusions. Status post right
thoracentesis today. Status post left thoracentesis on 12/06/2016.

EXAM:
CHEST 1 VIEW

[chest ap]
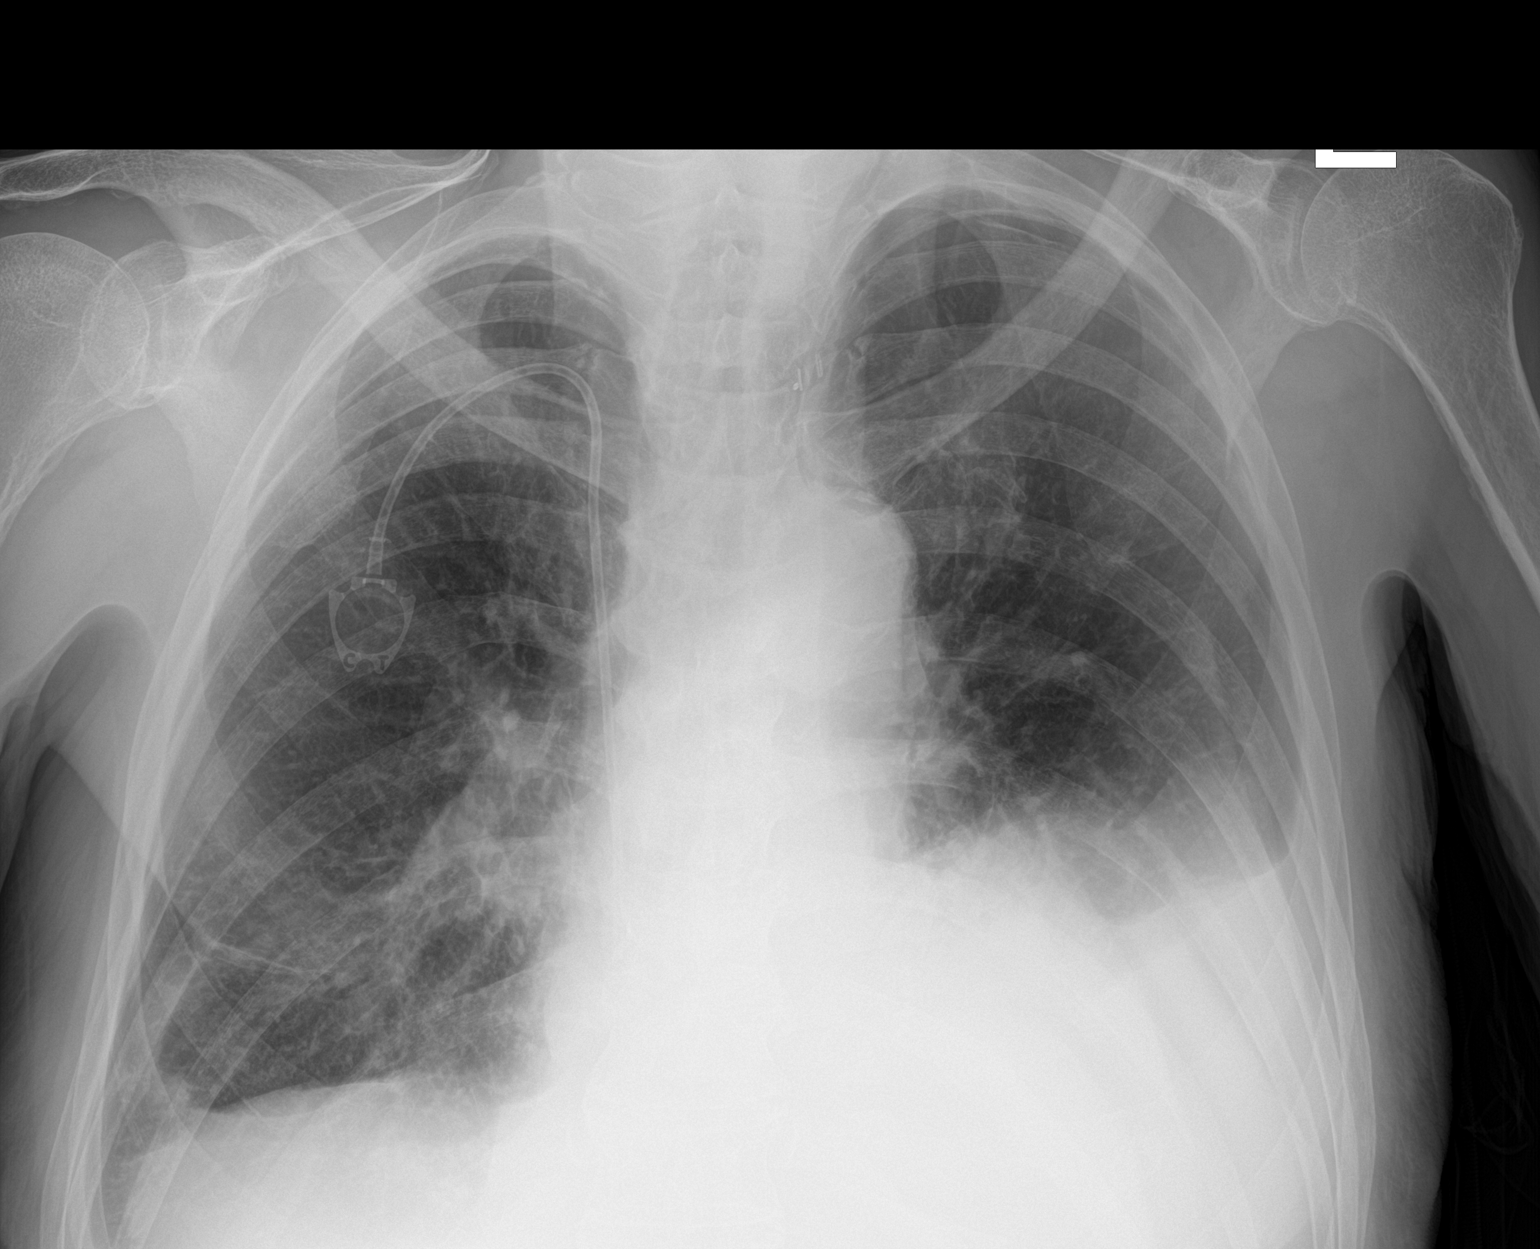

[1 of 1 positions shown; findings below may reference images not displayed]

FINDINGS: The heart size and mediastinal contours are within normal limits. No
significant right pleural fluid. No pneumothorax on the right.
Diminished size of ex vacuo left pneumothorax with tiny apical
component remaining. Recurrence of moderate to large left pleural
effusion. The visualized skeletal structures are unremarkable.
IMPRESSION: No evidence of right-sided pneumothorax following right
thoracentesis today. Decrease in ex vacuo left-sided pneumothorax
with reaccumulation of left pleural fluid since the last chest
x-ray.

## 2019-03-05 IMAGING — US IR CHEST US
1 series · 1 of 1 positions shown · non-contrast
Comparison: Right-sided thoracentesis - 12/19/2016;

CLINICAL DATA: History of esophageal carcinoma, now with recurrent
symptomatic bilateral pleural effusions.

Patient recently underwent right-sided thoracentesis on 12/19/2016
and presents today for attempted left-sided thoracentesis.
Note, patient experienced an ex vacuo pneumothorax following most
recent left-sided thoracentesis on 11/15/2016.
EXAM:
CHEST ULTRASOUND

[Series 1: ir (id) (id)/(id)/(id) ir · 1 of 1 slices shown]
[im 1/1]
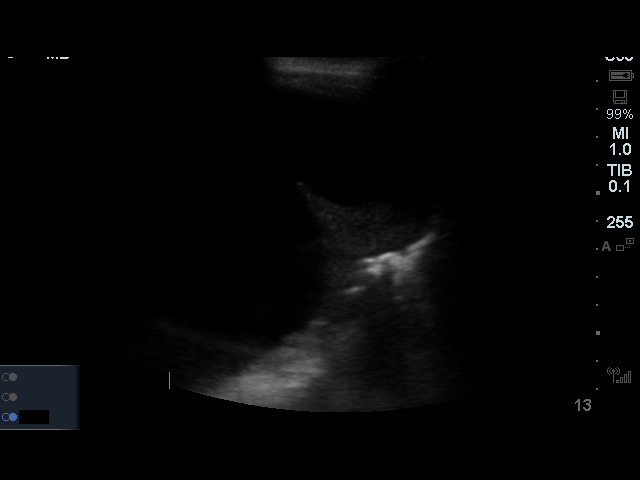

[1 of 1 positions shown; findings below may reference images not displayed]

12/04/2016;
11/10/2016; left-sided thoracentesis - 11/15/2016; chest radiograph
- 12/19/2016; 12/06/2016; 12/04/2016; 11/15/2016; 11/10/2016
FINDINGS: Sonographic evaluation of the left hemithorax demonstrates
development of a recurrent moderate to large sized anechoic pleural
effusion.

Risk and benefits of repeat ultrasound-guided thoracentesis were
discussed with the patient as well as discussion of the high
likelihood of a recurrent left-sided ex vacuo pneumothorax.

As patient is currently not very symptomatic, he wishes to forego
thoracentesis at this time.
IMPRESSION: Recurrent moderate to large size anechoic pleural effusion however
as above, patient does not wish to proceed with ultrasound-guided
thoracentesis at this time.

## 2019-03-10 IMAGING — US US THORACENTESIS ASP PLEURAL SPACE W/IMG GUIDE
1 series · 8 of 8 positions shown · non-contrast
Comparison: none

INDICATION: Esophageal cancer, dyspnea, recurrent bilateral pleural effusions.
Request made for therapeutic left thoracentesis.

[Series 1: us thoracentesis asp pleural space w/img guide · 0.26mm/px · 8 of 8 slices shown]
[im 1/8]
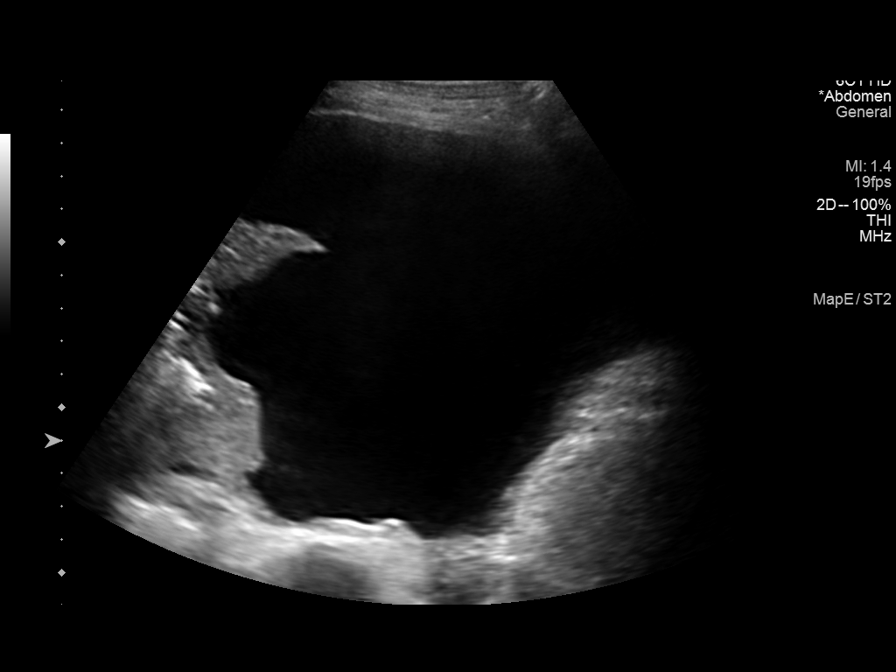
[im 2/8]
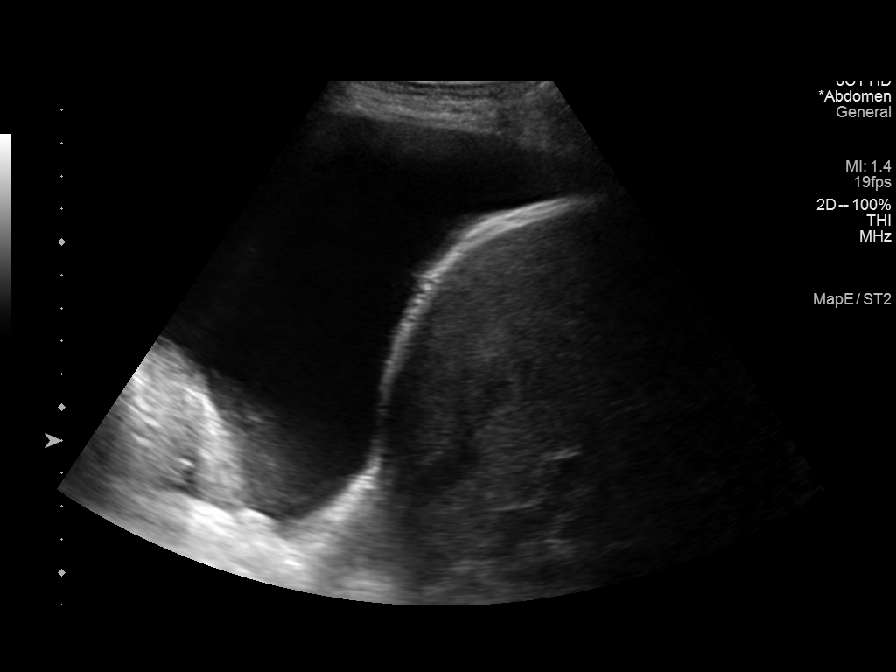
[im 3/8]
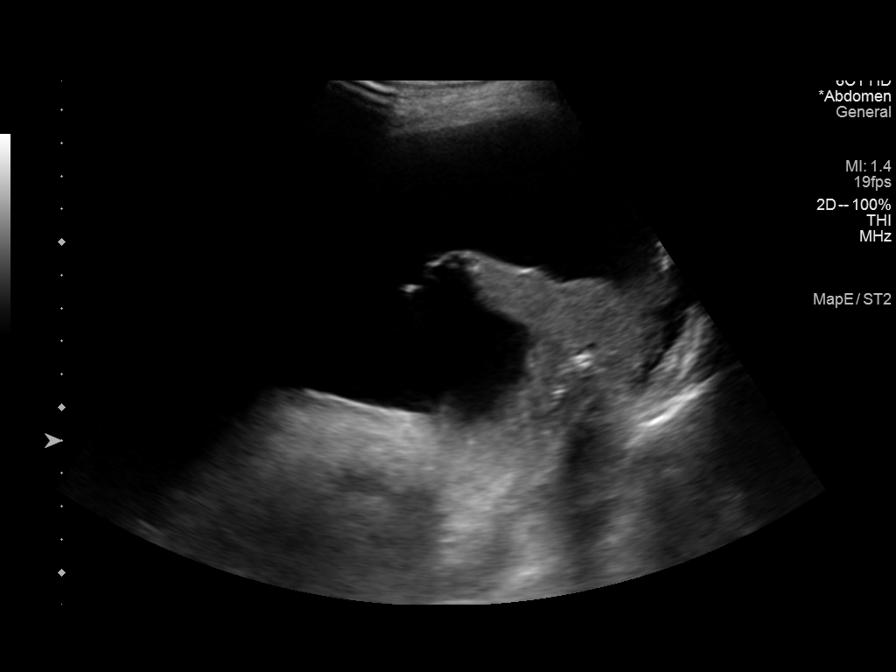
[im 4/8]
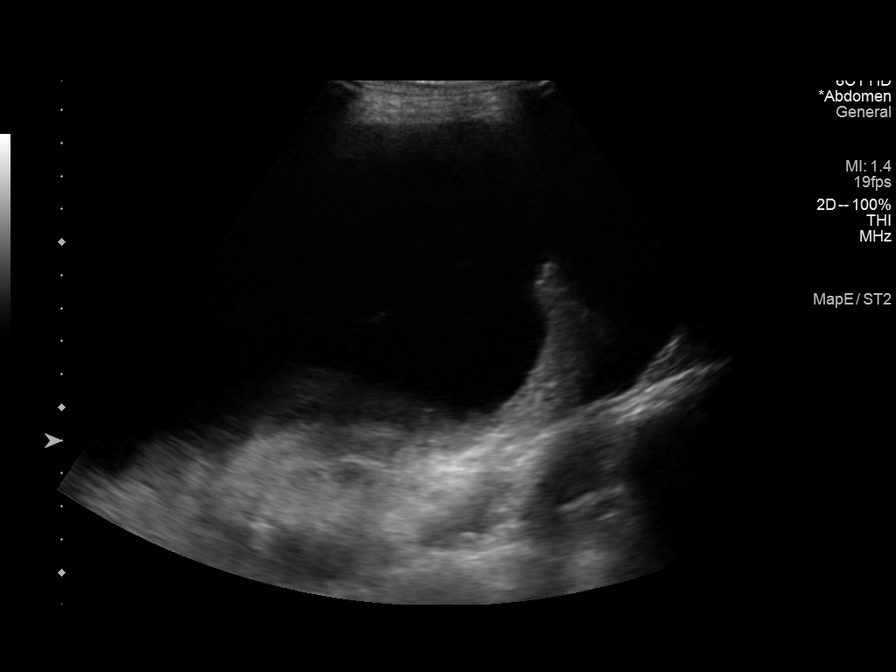
[im 5/8]
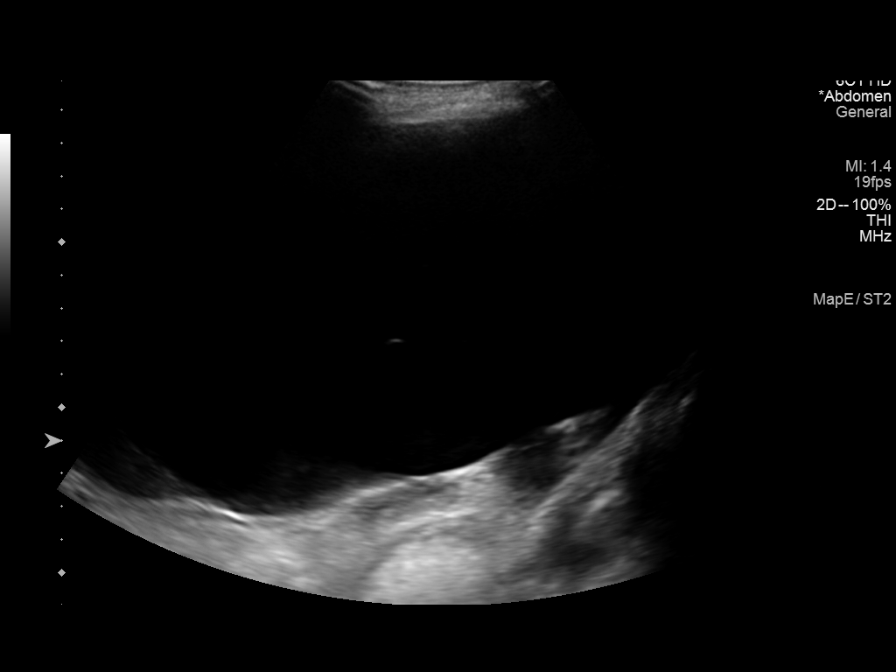
[im 6/8]
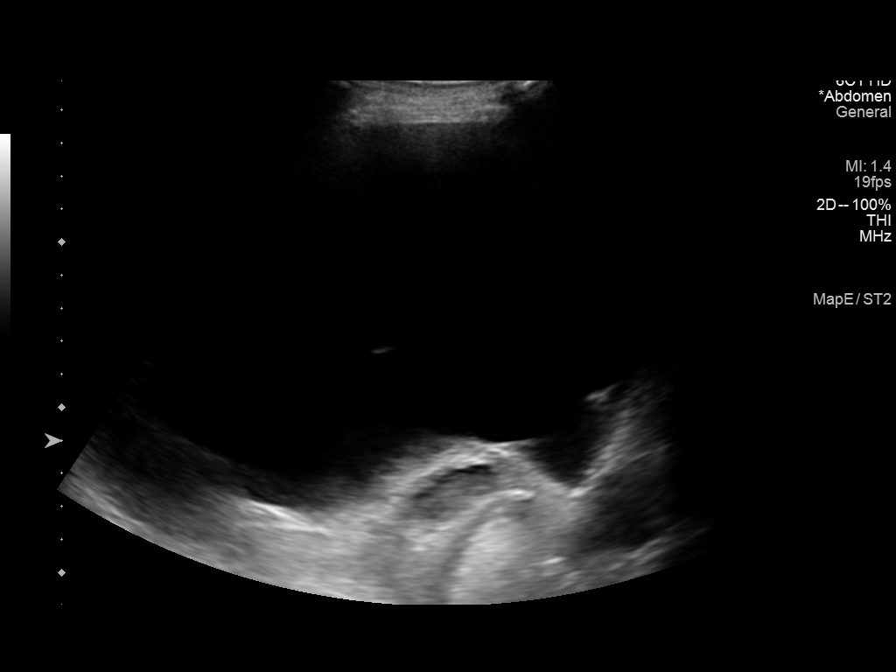
[im 7/8]
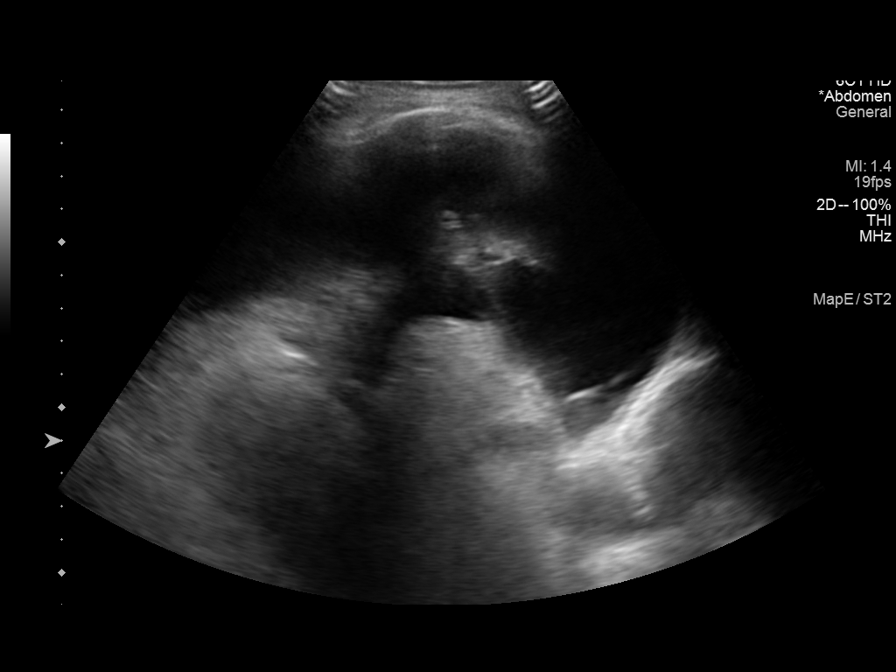
[im 8/8]
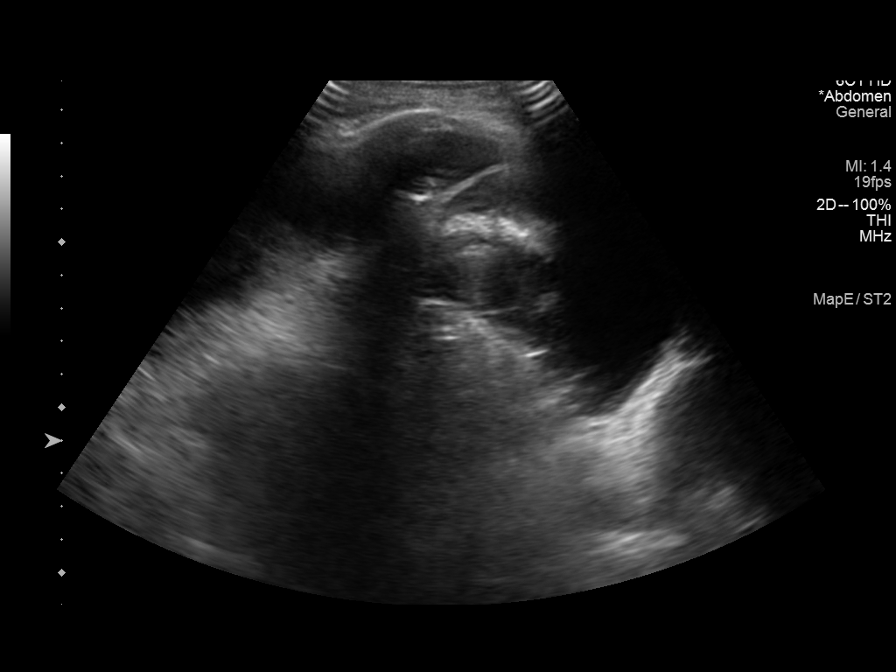

[8 of 8 positions shown; findings below may reference images not displayed]

EXAM:
ULTRASOUND GUIDED THERAPEUTIC LEFT THORACENTESIS

MEDICATIONS:
None.

COMPLICATIONS:
None immediate.

PROCEDURE:
An ultrasound guided thoracentesis was thoroughly discussed with the
patient and questions answered. The benefits, risks, alternatives
and complications were also discussed. The patient understands and
wishes to proceed with the procedure. Written consent was obtained.

Ultrasound was performed to localize and mark an adequate pocket of
fluid in the left chest. The area was then prepped and draped in the
normal sterile fashion. 1% Lidocaine was used for local anesthesia.
Under ultrasound guidance a Safe-T-Centesis catheter was introduced.
Thoracentesis was performed. The catheter was removed and a dressing
applied.
FINDINGS: A total of approximately 2.1 liters of hazy, yellow fluid was
removed.
IMPRESSION: Successful ultrasound guided therapeutic left thoracentesis yielding
2.1 liters of pleural fluid.

## 2019-03-10 IMAGING — DX DG CHEST 1V
1 series · 1 of 1 positions shown · non-contrast
Comparison: 12/19/2016

CLINICAL DATA: Status post left thoracentesis.

EXAM:
CHEST 1 VIEW

[chest pa]
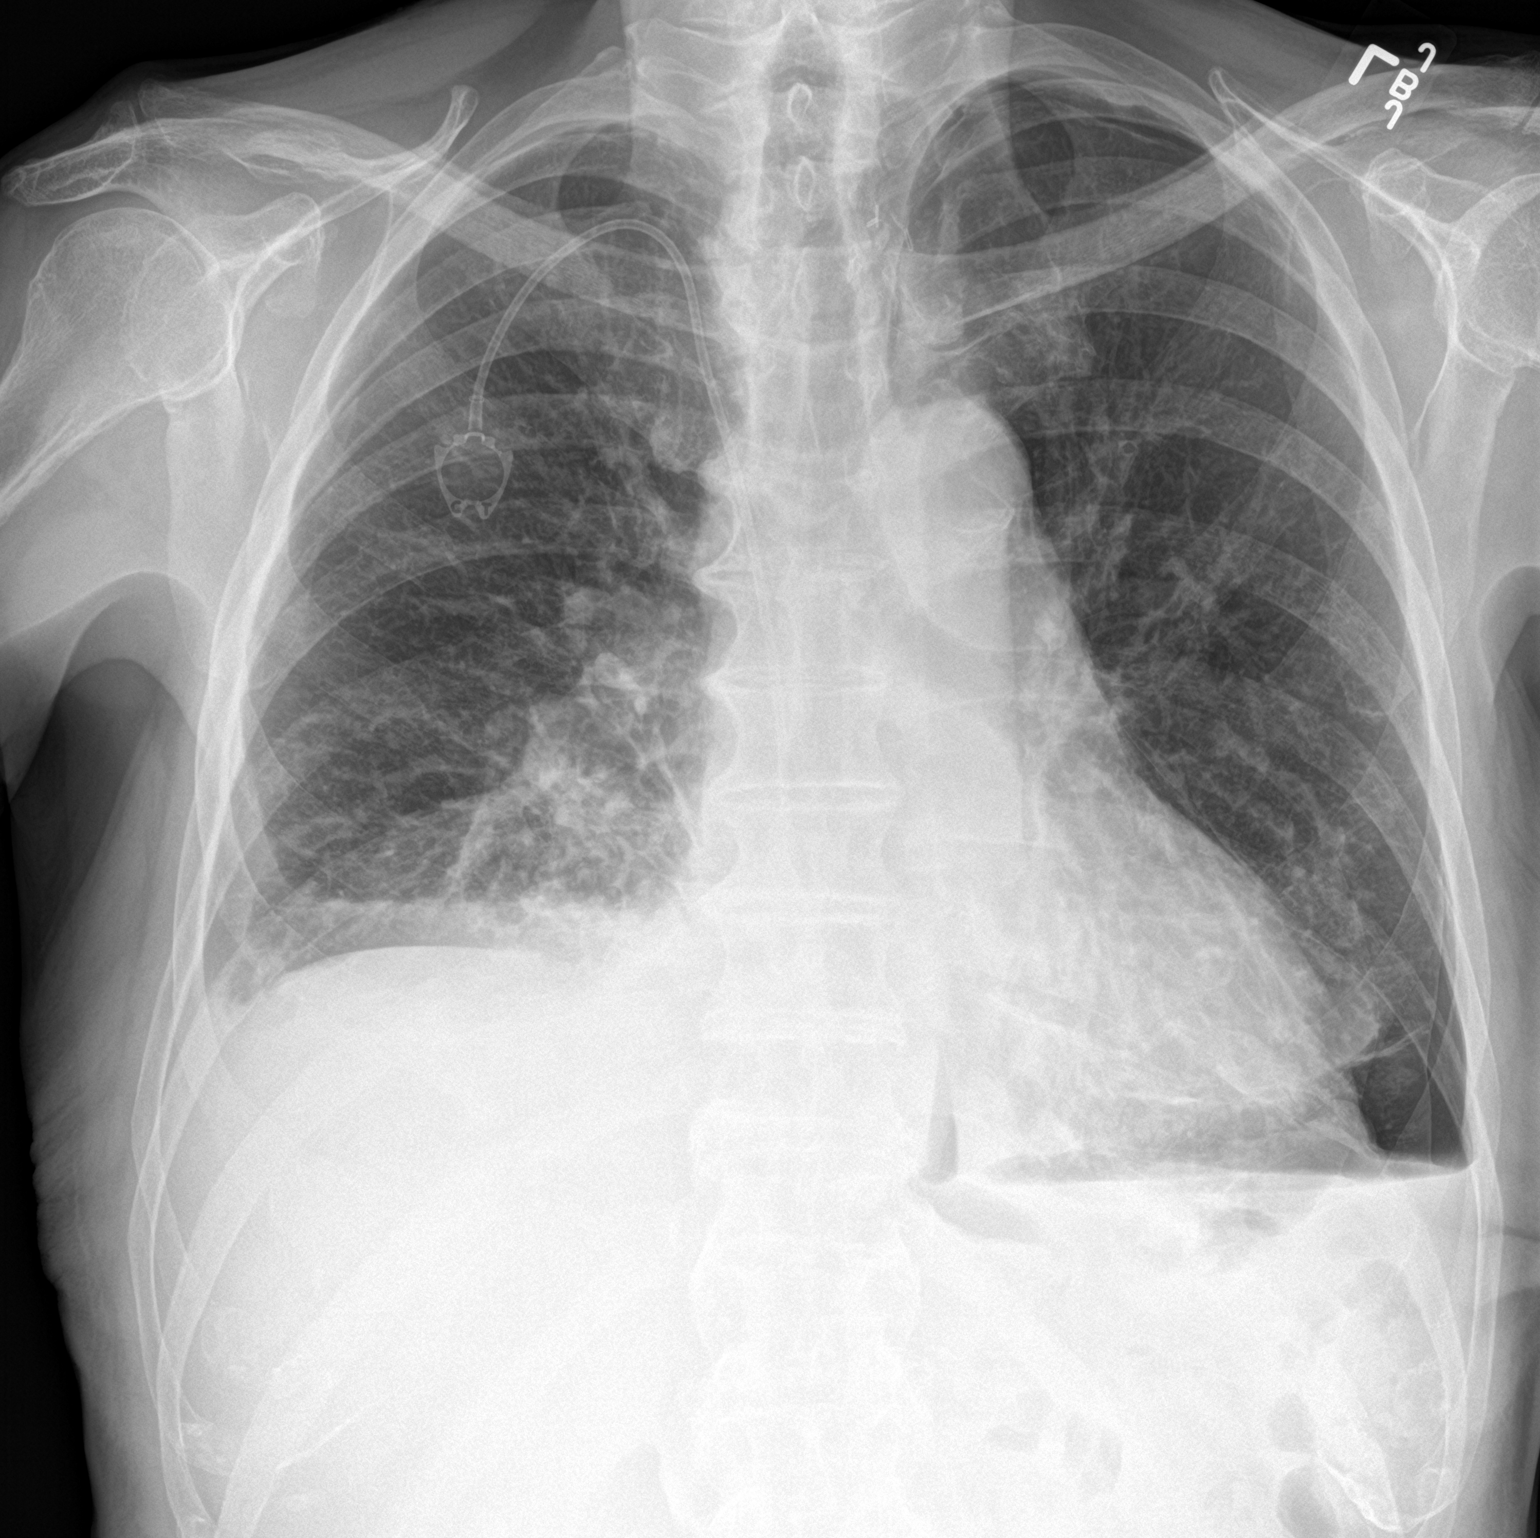

[1 of 1 positions shown; findings below may reference images not displayed]

FINDINGS: Minimal right base atelectasis noted with tiny right pleural
effusion. Interval decrease in left pleural effusion. Small left
pneumothorax visible, similar at the left apex to the prior study
and more prominent now at the left base after removal of the left
pleural fluid. Cardiopericardial silhouette is at upper limits of
normal for size. The visualized bony structures of the thorax are
intact. Right Port-A-Cath remains stable.
IMPRESSION: 1. Interval decrease in left pleural effusion with residual small
left pneumothorax, similar to prior at the left apex.
2. Minimal right base atelectasis with tiny right pleural effusion.
